# Patient Record
Sex: Female | Born: 1950
Health system: Southern US, Community
[De-identification: ages and names within clinical notes are randomized; demographics above are authoritative.]

## PROBLEM LIST (undated history)

## (undated) DIAGNOSIS — D649 Anemia, unspecified: Secondary | ICD-10-CM

## (undated) DIAGNOSIS — E039 Hypothyroidism, unspecified: Secondary | ICD-10-CM

## (undated) DIAGNOSIS — F32A Depression, unspecified: Secondary | ICD-10-CM

## (undated) DIAGNOSIS — C50919 Malignant neoplasm of unspecified site of unspecified female breast: Secondary | ICD-10-CM

## (undated) DIAGNOSIS — I480 Paroxysmal atrial fibrillation: Secondary | ICD-10-CM

## (undated) DIAGNOSIS — G4730 Sleep apnea, unspecified: Secondary | ICD-10-CM

## (undated) DIAGNOSIS — Z9221 Personal history of antineoplastic chemotherapy: Secondary | ICD-10-CM

## (undated) DIAGNOSIS — Z95810 Presence of automatic (implantable) cardiac defibrillator: Secondary | ICD-10-CM

## (undated) DIAGNOSIS — G4733 Obstructive sleep apnea (adult) (pediatric): Secondary | ICD-10-CM

## (undated) DIAGNOSIS — T82198A Other mechanical complication of other cardiac electronic device, initial encounter: Secondary | ICD-10-CM

## (undated) DIAGNOSIS — K7581 Nonalcoholic steatohepatitis (NASH): Secondary | ICD-10-CM

## (undated) DIAGNOSIS — I5022 Chronic systolic (congestive) heart failure: Secondary | ICD-10-CM

## (undated) DIAGNOSIS — N189 Chronic kidney disease, unspecified: Secondary | ICD-10-CM

## (undated) DIAGNOSIS — I428 Other cardiomyopathies: Secondary | ICD-10-CM

## (undated) DIAGNOSIS — I493 Ventricular premature depolarization: Secondary | ICD-10-CM

## (undated) DIAGNOSIS — I10 Essential (primary) hypertension: Secondary | ICD-10-CM

## (undated) DIAGNOSIS — R161 Splenomegaly, not elsewhere classified: Secondary | ICD-10-CM

## (undated) DIAGNOSIS — Z923 Personal history of irradiation: Secondary | ICD-10-CM

## (undated) DIAGNOSIS — F329 Major depressive disorder, single episode, unspecified: Secondary | ICD-10-CM

## (undated) DIAGNOSIS — F419 Anxiety disorder, unspecified: Secondary | ICD-10-CM

## (undated) HISTORY — DX: Splenomegaly, not elsewhere classified: R16.1

## (undated) HISTORY — PX: CHOLECYSTECTOMY: SHX55

## (undated) HISTORY — PX: ABDOMINAL HYSTERECTOMY: SHX81

## (undated) HISTORY — PX: TONSILLECTOMY: SUR1361

## (undated) HISTORY — DX: Paroxysmal atrial fibrillation: I48.0

## (undated) HISTORY — PX: FOOT SURGERY: SHX648

## (undated) HISTORY — DX: Chronic systolic (congestive) heart failure: I50.22

## (undated) HISTORY — DX: Ventricular premature depolarization: I49.3

## (undated) HISTORY — PX: BREAST LUMPECTOMY: SHX2

## (undated) HISTORY — DX: Obstructive sleep apnea (adult) (pediatric): G47.33

## (undated) HISTORY — PX: ESOPHAGOGASTRODUODENOSCOPY: SHX1529

## (undated) HISTORY — PX: TUBAL LIGATION: SHX77

---

## 1994-04-05 DIAGNOSIS — Z923 Personal history of irradiation: Secondary | ICD-10-CM

## 1994-04-05 DIAGNOSIS — Z9221 Personal history of antineoplastic chemotherapy: Secondary | ICD-10-CM

## 1994-04-05 HISTORY — DX: Personal history of antineoplastic chemotherapy: Z92.21

## 1994-04-05 HISTORY — DX: Personal history of irradiation: Z92.3

## 1994-04-05 HISTORY — PX: BREAST LUMPECTOMY: SHX2

## 1995-03-06 DIAGNOSIS — C50919 Malignant neoplasm of unspecified site of unspecified female breast: Secondary | ICD-10-CM

## 1995-03-06 HISTORY — PX: BREAST EXCISIONAL BIOPSY: SUR124

## 1995-03-06 HISTORY — DX: Malignant neoplasm of unspecified site of unspecified female breast: C50.919

## 2004-03-13 ENCOUNTER — Ambulatory Visit: Payer: Self-pay | Admitting: Internal Medicine

## 2004-04-05 ENCOUNTER — Ambulatory Visit: Payer: Self-pay | Admitting: Internal Medicine

## 2004-07-09 ENCOUNTER — Ambulatory Visit: Payer: Self-pay | Admitting: Internal Medicine

## 2004-07-20 ENCOUNTER — Ambulatory Visit: Payer: Self-pay | Admitting: Oncology

## 2004-08-03 ENCOUNTER — Ambulatory Visit: Payer: Self-pay | Admitting: Oncology

## 2004-08-29 ENCOUNTER — Ambulatory Visit: Payer: Self-pay

## 2004-09-24 ENCOUNTER — Ambulatory Visit: Payer: Self-pay

## 2005-04-19 ENCOUNTER — Ambulatory Visit: Payer: Self-pay | Admitting: Oncology

## 2005-04-30 ENCOUNTER — Ambulatory Visit: Payer: Self-pay | Admitting: Specialist

## 2005-05-06 ENCOUNTER — Ambulatory Visit: Payer: Self-pay | Admitting: Oncology

## 2005-05-27 ENCOUNTER — Ambulatory Visit: Payer: Self-pay | Admitting: Internal Medicine

## 2005-06-10 ENCOUNTER — Ambulatory Visit: Payer: Self-pay | Admitting: Cardiology

## 2005-06-10 ENCOUNTER — Encounter: Payer: Self-pay | Admitting: Cardiology

## 2005-06-10 ENCOUNTER — Ambulatory Visit (HOSPITAL_COMMUNITY): Admission: RE | Admit: 2005-06-10 | Discharge: 2005-06-10 | Payer: Self-pay | Admitting: Internal Medicine

## 2005-06-29 ENCOUNTER — Encounter: Payer: Self-pay | Admitting: Cardiovascular Disease

## 2005-06-29 ENCOUNTER — Ambulatory Visit: Payer: Self-pay | Admitting: Cardiovascular Disease

## 2005-06-30 ENCOUNTER — Ambulatory Visit (HOSPITAL_COMMUNITY): Admission: RE | Admit: 2005-06-30 | Discharge: 2005-07-01 | Payer: Self-pay | Admitting: Internal Medicine

## 2005-06-30 ENCOUNTER — Ambulatory Visit: Payer: Self-pay | Admitting: Internal Medicine

## 2005-06-30 HISTORY — PX: PACEMAKER INSERTION: SHX728

## 2005-07-14 ENCOUNTER — Ambulatory Visit: Payer: Self-pay

## 2005-10-12 ENCOUNTER — Ambulatory Visit: Payer: Self-pay | Admitting: Internal Medicine

## 2006-02-18 ENCOUNTER — Ambulatory Visit: Payer: Self-pay

## 2006-04-11 ENCOUNTER — Ambulatory Visit: Payer: Self-pay | Admitting: Oncology

## 2006-04-12 ENCOUNTER — Ambulatory Visit: Payer: Self-pay | Admitting: Internal Medicine

## 2006-05-06 ENCOUNTER — Ambulatory Visit: Payer: Self-pay | Admitting: Oncology

## 2006-07-12 ENCOUNTER — Ambulatory Visit: Payer: Self-pay | Admitting: Internal Medicine

## 2006-10-04 ENCOUNTER — Ambulatory Visit: Payer: Self-pay | Admitting: Internal Medicine

## 2007-01-19 ENCOUNTER — Other Ambulatory Visit: Payer: Self-pay

## 2007-01-19 ENCOUNTER — Ambulatory Visit: Payer: Self-pay | Admitting: Unknown Physician Specialty

## 2007-01-24 ENCOUNTER — Ambulatory Visit: Payer: Self-pay

## 2007-01-25 ENCOUNTER — Ambulatory Visit: Payer: Self-pay | Admitting: Unknown Physician Specialty

## 2007-04-06 ENCOUNTER — Ambulatory Visit: Payer: Self-pay | Admitting: Oncology

## 2007-04-06 HISTORY — PX: JOINT REPLACEMENT: SHX530

## 2007-04-19 ENCOUNTER — Ambulatory Visit: Payer: Self-pay | Admitting: Oncology

## 2007-04-20 ENCOUNTER — Ambulatory Visit: Payer: Self-pay | Admitting: Internal Medicine

## 2007-04-21 ENCOUNTER — Ambulatory Visit: Payer: Self-pay | Admitting: Podiatry

## 2007-05-07 ENCOUNTER — Ambulatory Visit: Payer: Self-pay | Admitting: Oncology

## 2007-07-25 ENCOUNTER — Ambulatory Visit: Payer: Self-pay | Admitting: Internal Medicine

## 2007-11-27 ENCOUNTER — Ambulatory Visit: Payer: Self-pay | Admitting: Internal Medicine

## 2008-01-01 ENCOUNTER — Ambulatory Visit: Payer: Self-pay | Admitting: Unknown Physician Specialty

## 2008-04-19 ENCOUNTER — Ambulatory Visit: Payer: Self-pay | Admitting: Internal Medicine

## 2008-05-08 ENCOUNTER — Encounter: Payer: Self-pay | Admitting: Internal Medicine

## 2008-08-01 ENCOUNTER — Encounter: Payer: Self-pay | Admitting: Internal Medicine

## 2008-09-03 ENCOUNTER — Ambulatory Visit: Payer: Self-pay | Admitting: Internal Medicine

## 2008-09-10 DIAGNOSIS — I429 Cardiomyopathy, unspecified: Secondary | ICD-10-CM | POA: Insufficient documentation

## 2008-09-10 DIAGNOSIS — Z95810 Presence of automatic (implantable) cardiac defibrillator: Secondary | ICD-10-CM | POA: Insufficient documentation

## 2008-09-10 DIAGNOSIS — I5022 Chronic systolic (congestive) heart failure: Secondary | ICD-10-CM | POA: Insufficient documentation

## 2008-09-10 DIAGNOSIS — I5023 Acute on chronic systolic (congestive) heart failure: Secondary | ICD-10-CM | POA: Insufficient documentation

## 2008-10-10 ENCOUNTER — Ambulatory Visit: Payer: Self-pay | Admitting: General Practice

## 2008-10-21 ENCOUNTER — Ambulatory Visit: Payer: Self-pay | Admitting: Internal Medicine

## 2008-10-21 ENCOUNTER — Encounter: Payer: Self-pay | Admitting: Internal Medicine

## 2008-10-24 ENCOUNTER — Inpatient Hospital Stay: Payer: Self-pay | Admitting: General Practice

## 2008-11-12 ENCOUNTER — Encounter: Payer: Self-pay | Admitting: General Practice

## 2008-12-04 ENCOUNTER — Encounter: Payer: Self-pay | Admitting: General Practice

## 2009-01-01 ENCOUNTER — Ambulatory Visit: Payer: Self-pay | Admitting: Unknown Physician Specialty

## 2009-01-03 ENCOUNTER — Encounter: Payer: Self-pay | Admitting: General Practice

## 2009-01-21 ENCOUNTER — Ambulatory Visit: Payer: Self-pay | Admitting: Internal Medicine

## 2009-02-03 ENCOUNTER — Encounter: Payer: Self-pay | Admitting: General Practice

## 2009-02-10 ENCOUNTER — Encounter: Payer: Self-pay | Admitting: Internal Medicine

## 2009-04-05 HISTORY — PX: BREAST EXCISIONAL BIOPSY: SUR124

## 2009-04-29 ENCOUNTER — Ambulatory Visit: Payer: Self-pay | Admitting: Internal Medicine

## 2009-05-07 ENCOUNTER — Encounter: Payer: Self-pay | Admitting: Internal Medicine

## 2009-07-29 ENCOUNTER — Ambulatory Visit: Payer: Self-pay | Admitting: Internal Medicine

## 2009-08-01 ENCOUNTER — Encounter: Payer: Self-pay | Admitting: Internal Medicine

## 2009-08-18 ENCOUNTER — Ambulatory Visit: Payer: Self-pay | Admitting: Unknown Physician Specialty

## 2009-09-19 ENCOUNTER — Ambulatory Visit: Payer: Self-pay | Admitting: Surgery

## 2009-09-26 ENCOUNTER — Ambulatory Visit: Payer: Self-pay | Admitting: Surgery

## 2009-10-10 ENCOUNTER — Ambulatory Visit: Payer: Self-pay | Admitting: Internal Medicine

## 2009-12-10 ENCOUNTER — Ambulatory Visit: Payer: Self-pay

## 2010-01-08 ENCOUNTER — Ambulatory Visit: Payer: Self-pay | Admitting: Internal Medicine

## 2010-04-17 ENCOUNTER — Encounter (INDEPENDENT_AMBULATORY_CARE_PROVIDER_SITE_OTHER): Payer: Self-pay | Admitting: *Deleted

## 2010-04-20 ENCOUNTER — Ambulatory Visit
Admission: RE | Admit: 2010-04-20 | Discharge: 2010-04-20 | Payer: Self-pay | Source: Home / Self Care | Attending: Internal Medicine | Admitting: Internal Medicine

## 2010-04-20 ENCOUNTER — Encounter: Payer: Self-pay | Admitting: Internal Medicine

## 2010-05-05 NOTE — Letter (Signed)
Summary: Remote Device Check  [facility] [place]  [address] [place]   [place], [place] [address]   Phone: [phone]  Fax: [phone]     May 07, 2009 MRN: [id number]   [place], [place]  [address]   Dear Ms. SHENOY,   Your remote transmission was recieved and reviewed by your physician.  All diagnostics were within normal limits for you.  __X___Your next transmission is scheduled for: July 29, 2009.  Please transmit at any time this day.  If you have a wireless device your transmission will be sent automatically.     Sincerely,  Hotel manager

## 2010-05-05 NOTE — Cardiovascular Report (Signed)
Summary: Office Visit Remote   Office Visit Remote   Imported By: Sallee Provencal 02/20/2010 14:09:01  _____________________________________________________________________  External Attachment:    Type:   Image     Comment:   External Document

## 2010-05-05 NOTE — Cardiovascular Report (Signed)
Summary: Office Visit Remote  Office Visit Remote   Imported By: Sallee Provencal 08/08/2009 14:25:44  _____________________________________________________________________  External Attachment:    Type:   Image     Comment:   External Document

## 2010-05-05 NOTE — Assessment & Plan Note (Signed)
Summary: F1Y/AMD   Visit Type:  Follow-up Primary Provider:  Melynda Ripple, MD  CC:  shortness of breath.  History of Present Illness: Katherine Rosales returns today for ICD followup for primary prevention in the setting of nonischemic heart disease.  The patient denies SOB, chest pain, edema or palpitations   her knee has been replaced and is much better; she has pain in her right foot.     Current Medications (verified): 1)  Hydrocodone-Acetaminophen 5-500 Mg Tabs (Hydrocodone-Acetaminophen) .Marland Kitchen.. 1 By Mouth Once Daily As Needed 2)  Carvedilol 12.5 Mg Tabs (Carvedilol) .... Take One Tablet By Mouth Twice A Day 3)  Levoxyl 100 Mcg Tabs (Levothyroxine Sodium) .Marland Kitchen.. 1 By Mouth Once Daily 4)  Zolpidem Tartrate 5 Mg Tabs (Zolpidem Tartrate) .Marland Kitchen.. 1 By Mouth Once Daily 5)  Furosemide 20 Mg Tabs (Furosemide) .... Take One Tablet By Mouth Daily. 6)  Lisinopril 20 Mg Tabs (Lisinopril) .... Take One Tablet By Mouth Daily 7)  Sertraline Hcl 100 Mg Tabs (Sertraline Hcl) .Marland Kitchen.. 1 By Mouth Once Daily 8)  Alprazolam 0.5 Mg Tabs (Alprazolam) .Marland Kitchen.. 1 By Mouth Once Daily 9)  Diclofenac Sodium 75 Mg Tbec (Diclofenac Sodium) .Marland Kitchen.. 1 By Mouth Two Times A Day 10)  Prilosec 20 Mg Cpdr (Omeprazole) .Marland Kitchen.. 1 By Mouth Once Daily 11)  Loratadine 10 Mg Tabs (Loratadine) .Marland Kitchen.. 1 By Mouth Once Daily 12)  Docusate Sodium 100 Mg Caps (Docusate Sodium) .Marland Kitchen.. 1 By Mouth Two Times A Day 13)  Centrum Silver  Tabs (Multiple Vitamins-Minerals) .Marland Kitchen.. 1po Once Daily 14)  Caltrate 600+d 600-400 Mg-Unit Tabs (Calcium Carbonate-Vitamin D) .Marland Kitchen.. 1 By Mouth Two Times A Day  Allergies (verified): No Known Drug Allergies  Past History:  Past Medical History: Last updated: 84/69/6295 SYSTOLIC HEART FAILURE, CHRONIC (ICD-428.22) CARDIOMYOPATHY, SECONDARY (ICD-425.9) ICD - IN SITU (ICD-V45.02)    Past Surgical History: Last updated: 09/10/2008 Lumpectomy Tonsillectomy tubal ligation Medtronic Virtuoso VR ICD - 06/30/05 - Nonischemic  cardiomyopathy potentially related to     adriamycin, class 3 congestive failure with a narrow QRS and a negative TDI.  Vital Signs:  Patient profile:   60 year old female Height:      62 inches Weight:      231 pounds BMI:     42.40 Pulse rate:   71 / minute BP sitting:   119 / 73  (left arm) Cuff size:   large  Vitals Entered By: Mignon Pine, RMA (October 10, 2009 2:55 PM)  Physical Exam  General:  The patient was alert and oriented in no acute distress. HEENT Normal.  Neck veins were flat, carotids were brisk.  Lungs were clear.  Heart sounds were regular without murmurs or gallops.  Abdomen was soft with active bowel sounds. There is no clubbing cyanosis or edema. Skin Warm and dry     ICD Specifications Following MD:  [name], MD     ICD Vendor:  Medtronic     ICD Model Number:  [id number]     ICD Serial Number:  [id number] ICD DOI:  06/30/2005     ICD Implanting MD:  [name], MD  Lead 1:    Location: RV     DOI: 06/30/2005     Model #: [id number]     Serial #: [id number]     Status: active  Indications::  NICM   ICD Follow Up Remote Check?  No Battery Voltage:  3.03 V     Charge Time:  9.8 seconds  Underlying rhythm:  SR ICD Dependent:  No       ICD Device Measurements Right Ventricle:  Amplitude: 12.4 mV, Impedance: 456 ohms, Threshold: 0.5 V at 0.4 msec Shock Impedance: 43/49 ohms   Episodes Coumadin:  No Shock:  0     ATP:  0     Nonsustained:  1     Ventricular Pacing:  0%  Brady Parameters Mode VVI     Lower Rate Limit:  40      Tachy Zones VF:  207     VT:  171     Next Remote Date:  01/08/2010     Next Cardiology Appt Due:  10/04/2010 Tech Comments:  No parameter changes.  Device function normal.  Optivol and thoracic impedance abnormal 10/05/09.  6949 lead stable SIC ).  Alert tones demonstated for the patient along with updated letter and magnet.  Carelink transmissions every 3 months.  ROV 1 year with Dr. [name] in [place]. Alma Friendly, LPN   October 11, 6267 4:85 PM   Impression & Recommendations:  Problem # 1:  [phone] SPRINT FIDELIS LEAD (ICD-996.04) the patient was given instructions regarding the 6949-lead and the need for magnet.  Problem # 2:  SYSTOLIC HEART FAILURE, CHRONIC (ICD-428.22) Heart failure is relatively well compensated. Her updated medication list for this problem includes:    Carvedilol 12.5 Mg Tabs (Carvedilol) .Marland Kitchen... Take 1 1/2 tablets by mouth twice a day    Furosemide 20 Mg Tabs (Furosemide) .Marland Kitchen... Take one tablet by mouth daily.    Lisinopril 20 Mg Tabs (Lisinopril) .Marland Kitchen... Take one tablet by mouth daily  Problem # 3:  CARDIOMYOPATHY, SECONDARY (ICD-425.9) as noted above she is relatively well compensated; however, given her left ventricular dysfunction I've taken the liberty of increasing her carvedilol to 18.75 twice a day with the target being 25 twice a day.  In addition, I would ask her primary cardiologist to consider the addition of Aldactone based on the Emphasis Trial. Her updated medication list for this problem includes:    Carvedilol 12.5 Mg Tabs (Carvedilol) .Marland Kitchen... Take 1 1/2 tablets by mouth twice a day    Furosemide 20 Mg Tabs (Furosemide) .Marland Kitchen... Take one tablet by mouth daily.    Lisinopril 20 Mg Tabs (Lisinopril) .Marland Kitchen... Take one tablet by mouth daily  Problem # 4:  ICD - IN SITU (ICD-V45.02) Device parameters and data were reviewed and no changes were made  Patient Instructions: 1)  Your physician has recommended you make the following change in your med62moication: START taking carvedilol 1 1/2 tabs two times a day  Prescriptions: CARVEDILOL 12.5 MG TABS (CARVEDILOL) Take 1 1/2 tablets by mouth twice a day  #90 x 6   Entered by:   ACordelia Pen RN   Authorized by:   SNikki Dom MD, [facility]  Signed by:   ACordelia Pen RN on 10/10/2009   Method used:   Electronically to        [facility] (retail)       [address]      [place]  [place]  [address]      Ph: [phone]      Fax: [phone]  RxID:   [id number]

## 2010-05-05 NOTE — Cardiovascular Report (Signed)
Summary: Office Visit Remote   Office Visit Remote   Imported By: Sallee Provencal 05/12/2009 16:41:29  _____________________________________________________________________  External Attachment:    Type:   Image     Comment:   External Document

## 2010-05-05 NOTE — Letter (Signed)
Summary: Remote Device Check  [facility], [place]  [address] [place]   [place], [place] [address]   Phone: [phone]  Fax: [phone]     August 01, 2009 MRN: [id number]   [place], [place]  [address]   Dear Ms. NILSON,   Your remote transmission was recieved and reviewed by your physician.  All diagnostics were within normal limits for you.    ___X___Your next office visit is scheduled for: JULY 2011 [place]. Please call our office to schedule an appointment.    Sincerely,  Hotel manager

## 2010-05-07 NOTE — Letter (Signed)
Summary: Device-Delinquent Phone Proofreader, [facility]  [address] [place]   [place], [place] [address]   Phone: [phone]  Fax: [phone]     April 17, 2010 MRN: [id number]   [place], [place]  [address]   Dear Ms. MCCART,  According to our records, you were scheduled for a device phone transmission on 04-16-2010.     We did not receive any results from this check.  If you transmitted on your scheduled day, please call us to help troubleshoot your system.  If you forgot to send your transmission, please send one upon receipt of this letter.  Thank you,   [facility]

## 2010-05-25 ENCOUNTER — Encounter (INDEPENDENT_AMBULATORY_CARE_PROVIDER_SITE_OTHER): Payer: Self-pay | Admitting: *Deleted

## 2010-06-02 NOTE — Letter (Signed)
Summary: Remote Device Check  [facility], [place]  [address] [place]   [place], [place] [address]   Phone: [phone]  Fax: [phone]     May 25, 2010 MRN: [id number]   Katherine Rosales, [name]  [address]   Dear [name],   Your remote transmission was recieved and reviewed by your physician.  All diagnostics were within normal limits for you.  __X___Your next transmission is scheduled for:  07-30-2010.  Please transmit at any time this day.  If you have a wireless device your transmission will be sent automatically.   Sincerely,  Shelly Bombard

## 2010-06-02 NOTE — Cardiovascular Report (Signed)
Summary: Office Visit Remote   Office Visit Remote   Imported By: Sallee Provencal 05/29/2010 11:00:52  _____________________________________________________________________  External Attachment:    Type:   Image     Comment:   External Document

## 2010-07-30 ENCOUNTER — Ambulatory Visit (INDEPENDENT_AMBULATORY_CARE_PROVIDER_SITE_OTHER): Payer: BC Managed Care – PPO | Admitting: *Deleted

## 2010-07-30 DIAGNOSIS — Z95810 Presence of automatic (implantable) cardiac defibrillator: Secondary | ICD-10-CM

## 2010-07-30 DIAGNOSIS — I5022 Chronic systolic (congestive) heart failure: Secondary | ICD-10-CM

## 2010-07-30 DIAGNOSIS — I429 Cardiomyopathy, unspecified: Secondary | ICD-10-CM

## 2010-08-01 ENCOUNTER — Other Ambulatory Visit: Payer: Self-pay

## 2010-08-09 NOTE — Progress Notes (Signed)
icd remote w/icm

## 2010-08-12 ENCOUNTER — Encounter: Payer: Self-pay | Admitting: *Deleted

## 2010-08-18 NOTE — Assessment & Plan Note (Signed)
[facility] OFFICE NOTE   HAE, AHLERS                       MRN:          [id number]  DATE:10/04/2006                            DOB:          1951-01-11    OBJECTIVE:  Ms. Katherine Rosales comes in.  Her husband has died in the  last couple of months from his debilitating Alzheimer's.  She is still  tearful and very emotional.   MEDICATIONS:  1. Are notable for the inter-current addition of sertraline.  2. Also the addition of Lisinopril.  3. She also is taking Coreg.  4. Lasix.   PHYSICAL EXAMINATION:  VITAL SIGNS:  Blood pressure 106/70, pulse 68.  LUNGS:  Clear.  HEART:  Sounds were regular.  EXTREMITIES:  Without edema.   Interrogation of her Medtronic ICD demonstrates an R-wave of 15.4 with  an impedance of 528, threshold of 1 volt at 0.4.  High voltage impedance  47/57, battery voltage 3.16.   IMPRESSION:  1. Non-ischemic cardiomyopathy.  2. Class 2 to 3 congestive heart failure.  3. Status post implantable cardioverter defibrillator for number one.  4. Emotional lability with the inter-current death of her husband.   Katherine Rosales is stable from an arrhythmia point of view.  We talked for 10-  15 minutes about her breathing status, and with her husband's birthday  coming up next week, I have encouraged her to spend some time with her  family that week.     Katherine Sprang, MD, [facility]  Electronically Signed    SCK/MedQ  DD: 10/04/2006  DT: 10/04/2006  Job #: [id number]   cc:   Katherine Skains, MD  Katherine Rosales

## 2010-08-18 NOTE — Assessment & Plan Note (Signed)
[facility] OFFICE NOTE   Katherine Rosales, Katherine Rosales                       MRN:          [id number]  DATE:01/24/2007                            DOB:          [birth year]    Katherine Rosales was seen today in the [facility] clinic on January 24, 2007  for followup of her Medtronic model #[id number], Virtuoso.  Date of implant  was June 30, 2005 for cardiomyopathy.  On interrogation of her device  today, her battery voltage is 3.14 with a charge time of 9 seconds.  R  waves measured 16.1 mV with a ventricular pacing threshold of 1 volt at  0.4 ms and a ventricular lead impedance of 520 ohm.  Shock impedance was  47.  Underlying rhythm today is sinus rhythm at 70 beats per minute.  She is descending 100% of the time.  There were no episodes since last  interrogation.  She does have a 6949 Medtronic Alert lead.  Her software  was downloaded today.  Her SIC counter was 0.  She will send a Care Link  transmission in January with a return office visit here in [place] to  see Dr. [name] in July, 2009.      Alma Friendly, LPN  Electronically Signed      Deboraha Sprang, MD, [facility]  Electronically Signed   PO/MedQ  DD: 01/24/2007  DT: 01/25/2007  Job #: [id number]

## 2010-08-18 NOTE — Letter (Signed)
November 27, 2007    Melynda Ripple, MD  [place], [place] [address]   RE:  Katherine Rosales, Katherine Rosales  MRN:  [id number]  /  DOB:  1951-03-24   Dear Fritz Pickerel,   [name] comes in today in followup for an ICD implanted for  nonischemic myopathy with congestive cardiomyopathy.  Her major problems  intercurrently have been bunion surgery, which has been associated with  poor healing, potential need for knee surgery and the ongoing depression  related to her husband's death.   Her medications currently include:  1. Lisinopril 20.  2. Carvedilol 12.5 b.i.d.  3. Alprazolam 0.5.  4. Sertraline 100.   On examination, her blood pressure is 140/78.  Her pulse is 64.  Her  weight was 240, which is stable.  Her lungs were clear.  Her heart  sounds were regular.  Neck veins were 7-8 cm.  Extremities had trace  edema.   Interrogation of Medtronic Virtuoso ICD demonstrates an R-wave of 12.7  with impedance of 496.  The threshold was 0.5 at 0.4.  Battery voltage  is 3.11.  A 6949 lead in place, which is stable.   IMPRESSION:  1. Nonischemic cardiomyopathy.  2. Chronic systolic heart failure - Class II.  3. A 6949 lead/MDT - implantable cardioverter-defibrillator.   [name] is doing quite well from an arrhythmia point of view.  I  suggested that when she see me the next time or see Bruce that her  carvedilol be uptitrated towards the target dose of 25 mg a day.   We will see her again in 1 year's time, should be interrogated remotely  in 3 months.    Sincerely,      Deboraha Sprang, MD, [facility]  Electronically Signed    SCK/MedQ  DD: 11/27/2007  DT: 11/28/2007  Job #: [id number]   CC:    Serafina Royals, MD

## 2010-08-20 ENCOUNTER — Ambulatory Visit: Payer: Self-pay | Admitting: Unknown Physician Specialty

## 2010-08-21 NOTE — Op Note (Signed)
NAME:  Katherine Rosales, Katherine Rosales NO.:  [id number]   MEDICAL RECORD NO.:  [id number]          PATIENT TYPE:  INP   LOCATION:  4703                         FACILITY:  [facility]   PHYSICIAN:  Deboraha Sprang, M.D.  DATE OF BIRTH:  03/13/1951   DATE OF PROCEDURE:  06/30/2005  DATE OF DISCHARGE:                                 OPERATIVE REPORT   PREOPERATIVE DIAGNOSIS:  Nonischemic cardiomyopathy potentially related to  adriamycin, class 3 congestive failure with a narrow QRS and a negative TDI.   POSTOPERATIVE DIAGNOSIS:  Nonischemic cardiomyopathy potentially related to  adriamycin, class 3 congestive failure with a narrow QRS and a negative TDI.   OPERATION PERFORMED:  Single chamber defibrillator implantation with  intraoperative defibrillation threshold testing.   DESCRIPTION OF PROCEDURE:  Following the obtaining of informed consent, the  patient was brought to the electrophysiology laboratory and placed on the  fluoroscopic table in supine position.  After routine prep and drape of the  left upper chest, lidocaine was infiltrated in the prepectoral subclavicular  region.  An incision was made and carried down to the layer of the  prepectoral fascia using electrocautery and sharp dissection.  A pocket was  formed similarly, hemostasis was obtained.   Thereafter attention was turned to gaining access to the extrathoracic left  subclavian vein which was accomplished without difficulty, without the  aspiration of air or puncture of the artery.  A 7 French sheath was placed  through which was passed a Medtronic 6949 65 cm active fixation dual chamber  defibrillator lead, serial  number [id number].  Under fluoroscopic guidance  it was manipulated to the right ventricular apex where the bipolar R wave  was 13.6 mV with a pacing impedance of 1043 ohms and threshold 0.8 V at 0.5  msec, current at threshold 0.9 mA and there was no diaphragmatic pacing at  10 V. This lead was secured  to the prepectoral fascia and then attached to a  Medtronic Virtuoso VR ICD serial number [id number] serial number [id number].  Through the device the bipolar R wave is 13.1 mV.  With the pacing impedance  of 70 ohms and a threshold of 0.5 Volts at 0.4 msec, current of injury was  adequate.  SVC coil impedance was 48.  Distal coil impedance was 44.   At this point defibrillation threshold testing was undertaken.  Ventricular  fibrillation was induced via the T wave shock.  After a total duration of 8  seconds, a 20 joule shock was delivered through a measured resistance of 39  ohms, terminating ventricular fibrillation restoring sinus rhythm.   After waiting five to six minutes, ventricular fibrillation was reinduced  via the T wave shock.  After a total duration of seven seconds, a 20 joule  shock was delivered through a measured resistance of 39 ohms terminating  ventricular fibrillation and restoring sinus rhythm.  At this point the  device was implanted.  The pocket was copiously irrigated with antibiotic  containing saline solution.  Hemostasis was assured and the lead and pulse  generator were placed in the pocket and secured to  the prepectoral fascia.  The wound was closed in three layers in normal fashion.  Wound  was washed and dried and a benzoin and Steri-Strip dressing was applied.  Sponge, needle and instrument counts were correct at the end of the  procedure according to the staff.  The patient tolerated the procedure  without apparent complications.           ______________________________  Deboraha Sprang, M.D.     SCK/MEDQ  D:  06/30/2005  T:  07/01/2005  Job:  [id number]   cc:   Dr. Serafina Royals, [place]  Fax: [phone]   Electrophys lab   [place] [facility]

## 2010-08-21 NOTE — Letter (Signed)
October 12, 2005     Dr. Serafina Royals  [place], [place]   RE:  Katherine Rosales, Katherine Rosales  MRN #[id number]  /  DOB:  [birth year]   Dear Darnell Level,   I hope this letter finds you well.  Katherine Rosales is doing well following  ICD implantation for Class III congestive heart failure and non-ischemic  heart disease.  She is actually doing quite well emotionally as well.  She  recently received her disability.   MEDICATIONS:  Her medications are reviewed and are __________ and notable  for Coreg now at __________ at bedtime.   PHYSICAL EXAMINATION:  VITAL SIGNS:  On examination her blood pressure was  114/73.  Her pulse is 73.  LUNGS:  Clear.  HEART:  Sounds were regular.  EXTREMITIES:  Without edema.   Interrogation of her Medtronic Virtuoso Pulse Generator demonstrates an R  wave of 19.7 with an impedence of 536 with a threshold of 1 volt at 0.4.  The battery voltage was 3.20.   The device is reprogrammed to maximize longevity.   IMPRESSION:  1.  Non-ischemic heart disease.  2.  Status post ICD implantation.  3.  MR.  4.  Emotional lability.   Katherine Rosales is doing quite well.  The issue Bruce I guess will be where she  gets her device followed up.  I set her up remotely  temporarily to be done via [facility].  I have told her that she can talk with  you all about whether you want to get it done in your office.  We will be  glad to help her with whatever we can. ,    Sincerely,      Deboraha Sprang, MD, [facility]   SCK/MedQ  DD:  10/12/2005  DT:  10/12/2005  Job #:  [id number]

## 2010-08-21 NOTE — Discharge Summary (Signed)
NAME:  Katherine Rosales, ORDOYNE NO.:  [id number]   MEDICAL RECORD NO.:  [id number]          PATIENT TYPE:  INP   LOCATION:  3903                         FACILITY:  [facility]   PHYSICIAN:  Sueanne Margarita, P.A. DATE OF BIRTH:  June 22, 1950   DATE OF ADMISSION:  06/30/2005  DATE OF DISCHARGE:  07/01/2005                                 DISCHARGE SUMMARY   ALLERGIES:  This patient has no known drug allergies.   PRINCIPAL DIAGNOSES:  1.  Discharging day one, status post implantation of Medtronic Virtuoso      cardioverter defibrillator.  2.  Nonischemic cardiomyopathy with decline in ejection fraction from 45% to      10%.  3.  Moderate to severe mitral regurgitation/tricuspid regurgitation.  4.  Class III congestive heart failure.  5.  Atrial tachycardia, nonsustained on Holter monitor.   SECONDARY DIAGNOSES:  1.  Obstructive sleep apnea, on BiPAP.  2.  Anxiety/depression.  3.  Nephrotic syndrome/steroids.  4.  Treated hypothyroidism.  5.  Arthritis.  6.  Gastroesophageal reflux disease.  7.  History of breast cancer, status post chemotherapy and radiation      therapy.  8.  Status post tonsillectomy/tubal ligation/lumpectomy.   PROCEDURES:  1.  On June 30, 2005, implantation of Medtronic Virtuoso single-chamber      cardioverter defibrillator with defibrillator threshold study less than      or equal to 20 Joules by Dr. [name].   BRIEF HISTORY:  Katherine Rosales is a 60 year old female who first came to  attention after Adriamycin therapy for breast cancer.  At that time, her  ejection fraction was 45%-50%.  She underwent a catheterization  demonstrating no obstructive coronary artery disease.   She returned to medical attention about a year ago with overt symptoms of  congestive heart failure.  Echocardiogram demonstrated moderate to severe  mitral regurgitation with an ejection fraction of 10%-15% at that time.  She  has been treated medically and has done  well over the last year, but  currently is experiencing progressive and worsening symptoms of congestive  heart failure.  These symptoms are fatigue and exercise intolerance, but no  orthopnea, peripheral edema or nocturnal dyspnea.   She has been diagnosed with obstructive sleep apnea, and she is on BiPAP.  There is some concern that her sleep apnea may be aggravating her  cardiomyopathy.  The patient does not experience significant palpitations,  although she does feel her heart racing sometimes when she sleeps on her  back.  Holter monitoring has demonstrated some atrial tachycardia, but none  which is sustained.   Ms. Laden has severe cardiomyopathy; this is most probably nonischemic, and  most probably secondary to AV regurgitation.  An implantable cardioverter  defibrillator makes sense, given that her congestive heart failure symptoms  are class III.  I would recommend Doppler imaging, to see if there was any  dyssynchrony.  Perhaps LV pacing would help reduce mitral regurgitation.  We  plan to undertake tissue Doppler imaging and then consider implantable  cardioverter defibrillator; this could be done on an elective basis, and we  would be glad to see her.   HOSPITAL COURSE:  The patient presented on an elective basis on March 28th.  I do not have the results of the tissue Doppler study at the time of this  dictation.  However, the patient underwent implantation of a Medtronic  Virtuoso single-chamber cardioverter defibrillator, and has done well since  this procedure; Dr. [name] tested it, and defibrillator threshold setting was  less than or equal to 20 Joules.  The patient has had no swelling, hematoma,  drainage or erythema at the incision site.  She is ambulating independently.  She is not requiring anything more than oral analgesics for discomfort.  Followup instructions have been given to her, as well as mobility of the  left arm.  She was asked to keep her incision dry  for the next seven days,  with just sponge bathing until Wednesday, April 4th.  Her discharge diet is  a low-sodium, low-cholesterol diet.  She has been asked not to drive for the  next seven days and not to lift anything heavier than 10 pounds for the next  four weeks.  She has an appointment with [facility] on Wednesday, April 11th at 10:20, and she will  see Dr. Nehemiah Massed in three weeks.  Dr. [name] specifically recommended up-  titration of Coreg at that time, if agreeable.  We will call Dr. Alveria Apley  office to make an appointment, and Dr. Alveria Apley office will call the  patient with this.   DISCHARGE MEDICATIONS:  1.  Coreg 6.25 mg twice daily.  2.  Estradiol 0.5 mg daily.  3.  Furosemide 20 mg daily.  4.  Levoxyl 100 mcg daily.  5.  Effexor XR 150 mg daily.  6.  Gabapentin 400 mg daily.  7.  Alprazolam 0.25 mg twice daily.  8.  Voltaren 75 mg twice daily.  9.  Flonase two sprays to each nostril daily.  10. Prilosec 20 mg daily.  11. Loratadine 10 mg daily.  12. Colace 100 mg two tabs daily.  13. Multivitamin daily.   LABORATORY STUDIES THIS ADMISSION:  Serum electrolytes on March 28th showed  sodium 140, potassium 4.2, chloride 107, carbonate 25, BUN 27, creatinine 1  and glucose 130.  PT on admission was 15, INR 1.2 and PTT 35.  Complete  blood count showed white cells 6.4, hemoglobin 11.8, hematocrit 36 and  platelets 182.      Sueanne Margarita, P.A.     GM/MEDQ  D:  07/01/2005  T:  07/02/2005  Job:  [id number]   cc:   Serafina Royals, MD  [facility]  [place], [place]   Deboraha Sprang, M.D.  [address]  [place] [address]

## 2010-08-21 NOTE — Op Note (Signed)
NAME:  Katherine Rosales, FLAMMER NO.:  [id number]   MEDICAL RECORD NO.:  [id number]          PATIENT TYPE:  INP   LOCATION:  4703                         FACILITY:  [facility]   PHYSICIAN:  Deboraha Sprang, M.D.  DATE OF BIRTH:  December 26, 1950   DATE OF PROCEDURE:  06/30/2005  DATE OF DISCHARGE:  07/01/2005                                 OPERATIVE REPORT   PREOPERATIVE DIAGNOSIS:  Atrial fibrillation.   POSTOPERATIVE DIAGNOSIS:  Sinus rhythm.   PROCEDURE:  Direct current cardioversion.   The patient was submitted to general anesthesia under the care of Dr. Conrad Tensed.  She received sodium Pentothal 150 mg.  A 100 joule shock was delivered  synchronously into atrial fibrillation restoring sinus rhythm.   The patient tolerated the procedure without apparent complications.           ______________________________  Deboraha Sprang, M.D.     SCK/MEDQ  D:  06/30/2005  T:  07/01/2005  Job:  [id number]   cc:   Freeman Caldron. Pauline Aus, M.D.  Fax: [phone]

## 2010-09-24 ENCOUNTER — Encounter: Payer: Self-pay | Admitting: Internal Medicine

## 2010-10-14 ENCOUNTER — Encounter: Payer: BC Managed Care – PPO | Admitting: Internal Medicine

## 2010-10-15 ENCOUNTER — Other Ambulatory Visit: Payer: Self-pay | Admitting: *Deleted

## 2010-10-15 MED ORDER — CARVEDILOL 12.5 MG PO TABS: 18.7500 mg | ORAL_TABLET | Freq: Two times a day (BID) | ORAL | Status: DC

## 2010-10-27 ENCOUNTER — Encounter: Payer: Self-pay | Admitting: Internal Medicine

## 2010-10-27 ENCOUNTER — Ambulatory Visit (INDEPENDENT_AMBULATORY_CARE_PROVIDER_SITE_OTHER): Payer: BC Managed Care – PPO | Admitting: Internal Medicine

## 2010-10-27 DIAGNOSIS — F439 Reaction to severe stress, unspecified: Secondary | ICD-10-CM | POA: Insufficient documentation

## 2010-10-27 DIAGNOSIS — I429 Cardiomyopathy, unspecified: Secondary | ICD-10-CM

## 2010-10-27 DIAGNOSIS — Z733 Stress, not elsewhere classified: Secondary | ICD-10-CM

## 2010-10-27 DIAGNOSIS — Z95810 Presence of automatic (implantable) cardiac defibrillator: Secondary | ICD-10-CM

## 2010-10-27 DIAGNOSIS — I5022 Chronic systolic (congestive) heart failure: Secondary | ICD-10-CM

## 2010-10-27 DIAGNOSIS — I428 Other cardiomyopathies: Secondary | ICD-10-CM

## 2010-10-27 LAB — ICD DEVICE OBSERVATION
CHARGE TIME: 10.099 s
DEV-0020ICD: NEGATIVE
FVT: 0
PACEART VT: 0
RV LEAD IMPEDENCE ICD: 480 Ohm
RV LEAD THRESHOLD: 1 V
TOT-0001: 2
TZAT-0001FASTVT: 1
TZAT-0001SLOWVT: 1
TZAT-0001SLOWVT: 2
TZAT-0004SLOWVT: 8
TZAT-0005SLOWVT: 88 pct
TZAT-0011SLOWVT: 10 ms
TZAT-0011SLOWVT: 10 ms
TZAT-0012FASTVT: 200 ms
TZAT-0012SLOWVT: 200 ms
TZAT-0012SLOWVT: 200 ms
TZAT-0013SLOWVT: 2
TZAT-0018FASTVT: NEGATIVE
TZAT-0019FASTVT: 8 V
TZAT-0019SLOWVT: 8 V
TZAT-0019SLOWVT: 8 V
TZAT-0020SLOWVT: 1.5 ms
TZON-0003VSLOWVT: 450 ms
TZON-0004SLOWVT: 16
TZON-0004VSLOWVT: 20
TZON-0005SLOWVT: 12
TZST-0001FASTVT: 3
TZST-0001SLOWVT: 3
TZST-0001SLOWVT: 4
TZST-0001SLOWVT: 5
TZST-0002FASTVT: NEGATIVE
TZST-0002FASTVT: NEGATIVE
TZST-0002FASTVT: NEGATIVE
TZST-0003SLOWVT: 35 J
TZST-0003SLOWVT: 35 J
VENTRICULAR PACING ICD: 0 pct
VF: 0

## 2010-10-27 NOTE — Assessment & Plan Note (Signed)
The patient's device was interrogated.  The information was reviewed. No changes were made in the programming.    

## 2010-10-27 NOTE — Patient Instructions (Signed)
Continue on current medications. Follow up with Dr.Klein in 12 months.

## 2010-10-27 NOTE — Assessment & Plan Note (Signed)
Stable

## 2010-10-27 NOTE — Progress Notes (Signed)
  HPI  Katherine Rosales is a 60 y.o. female CD followup for primary prevention in the setting of nonischemic heart disease.  The patient denies SOB, chest pain, edema or palpitations  She is stressed greatly in caring for her parents.  She is picking at her face   Past Medical History  Diagnosis Date  . Systolic heart failure, chronic   . Cardiomyopathy   . ICD (implantable cardiac defibrillator), single, in situ     Past Surgical History  Procedure Date  . Breast lumpectomy   . Tonsillectomy   . Tubal ligation   . Pacemaker insertion 06/30/2005    Medtronic Virtuoso VR ICD - Nonischemic cardiomyopathy potentially related to adriamycin, class 3 congestive failture with a narrow QRS and a negative TDI    Current Outpatient Prescriptions  Medication Sig Dispense Refill  . ALPRAZolam (XANAX) 0.5 MG tablet Take 0.5 mg by mouth 2 (two) times daily.       . Calcium Carbonate-Vitamin D (CALTRATE 600+D) 600-400 MG-UNIT per tablet Take 1 tablet by mouth 2 (two) times daily.        . carvedilol (COREG) 12.5 MG tablet Take 1.5 tablets (18.75 mg total) by mouth 2 (two) times daily with a meal.  90 tablet  2  . diclofenac (VOLTAREN) 75 MG EC tablet Take 75 mg by mouth 2 (two) times daily.        Marland Kitchen docusate sodium (COLACE) 100 MG capsule Take 100 mg by mouth 2 (two) times daily.        . furosemide (LASIX) 20 MG tablet Take 20 mg by mouth daily.        Marland Kitchen HYDROcodone-acetaminophen (VICODIN) 5-500 MG per tablet Take 1 tablet by mouth daily as needed.        Marland Kitchen levothyroxine (SYNTHROID, LEVOTHROID) 100 MCG tablet Take 100 mcg by mouth daily.        Marland Kitchen lisinopril (PRINIVIL,ZESTRIL) 20 MG tablet Take 20 mg by mouth daily.        Marland Kitchen loratadine (CLARITIN) 10 MG tablet Take 10 mg by mouth daily.        . Multiple Vitamins-Minerals (CENTRUM SILVER) tablet Take 1 tablet by mouth daily.        Marland Kitchen omeprazole (PRILOSEC) 20 MG capsule Take 20 mg by mouth daily.        . sertraline (ZOLOFT) 100 MG tablet Take  100 mg by mouth 2 (two) times daily.       Marland Kitchen zolpidem (AMBIEN) 5 MG tablet Take 5 mg by mouth daily.          No Known Allergies  Review of Systems negative except from HPI and PMH  Physical Exam Well developed and well nourished in no acute distress HENT normaE scleral and icterus clear Neck Supple clear to ausculation Regular rate and rhythm, no murmurs gallops or rub Soft with active bowel sounds No clubbing cyanosis and edema Alert and oriented, grossly normal motor and sensory function Skin Warm and Dry. Multiple sores on favce    Assessment and  Plan

## 2010-10-27 NOTE — Assessment & Plan Note (Signed)
contnue current meds

## 2010-10-27 NOTE — Assessment & Plan Note (Signed)
Have suggested she seek counseling

## 2011-01-28 ENCOUNTER — Encounter: Payer: BC Managed Care – PPO | Admitting: *Deleted

## 2011-02-03 ENCOUNTER — Other Ambulatory Visit: Payer: Self-pay | Admitting: *Deleted

## 2011-02-03 MED ORDER — CARVEDILOL 12.5 MG PO TABS: 18.7500 mg | ORAL_TABLET | Freq: Two times a day (BID) | ORAL | Status: DC

## 2011-02-04 ENCOUNTER — Encounter: Payer: Self-pay | Admitting: *Deleted

## 2011-02-07 ENCOUNTER — Other Ambulatory Visit: Payer: Self-pay | Admitting: Internal Medicine

## 2011-02-07 ENCOUNTER — Encounter: Payer: Self-pay | Admitting: Internal Medicine

## 2011-02-08 ENCOUNTER — Encounter: Payer: BC Managed Care – PPO | Admitting: *Deleted

## 2011-02-08 ENCOUNTER — Ambulatory Visit (INDEPENDENT_AMBULATORY_CARE_PROVIDER_SITE_OTHER): Payer: BC Managed Care – PPO | Admitting: *Deleted

## 2011-02-08 DIAGNOSIS — I5022 Chronic systolic (congestive) heart failure: Secondary | ICD-10-CM

## 2011-02-08 DIAGNOSIS — I429 Cardiomyopathy, unspecified: Secondary | ICD-10-CM

## 2011-02-08 DIAGNOSIS — Z95810 Presence of automatic (implantable) cardiac defibrillator: Secondary | ICD-10-CM

## 2011-02-14 LAB — REMOTE ICD DEVICE
BATTERY VOLTAGE: 2.88 V
BRDY-0002RV: 40 {beats}/min
CHARGE TIME: 10.48 s
DEV-0020ICD: NEGATIVE
FVT: 0
PACEART VT: 0
RV LEAD IMPEDENCE ICD: 472 Ohm
TOT-0001: 2
TZAT-0001SLOWVT: 2
TZAT-0002FASTVT: NEGATIVE
TZAT-0004SLOWVT: 8
TZAT-0005SLOWVT: 88 pct
TZAT-0011SLOWVT: 10 ms
TZAT-0011SLOWVT: 10 ms
TZAT-0012FASTVT: 200 ms
TZAT-0012SLOWVT: 200 ms
TZAT-0013SLOWVT: 2
TZAT-0013SLOWVT: 2
TZAT-0018FASTVT: NEGATIVE
TZAT-0020FASTVT: 1.5 ms
TZON-0003SLOWVT: 350 ms
TZON-0003VSLOWVT: 450 ms
TZON-0004SLOWVT: 16
TZON-0004VSLOWVT: 20
TZON-0005SLOWVT: 12
TZST-0001FASTVT: 2
TZST-0001FASTVT: 4
TZST-0001FASTVT: 6
TZST-0001SLOWVT: 3
TZST-0002FASTVT: NEGATIVE
TZST-0003SLOWVT: 35 J
TZST-0003SLOWVT: 35 J
TZST-0003SLOWVT: 35 J
VENTRICULAR PACING ICD: 0 pct
VF: 0

## 2011-02-19 NOTE — Progress Notes (Signed)
Remote icd check w/icm

## 2011-03-11 ENCOUNTER — Encounter: Payer: Self-pay | Admitting: *Deleted

## 2011-05-20 ENCOUNTER — Ambulatory Visit (INDEPENDENT_AMBULATORY_CARE_PROVIDER_SITE_OTHER): Payer: Medicare Other | Admitting: *Deleted

## 2011-05-20 ENCOUNTER — Encounter: Payer: Self-pay | Admitting: Internal Medicine

## 2011-05-20 DIAGNOSIS — I429 Cardiomyopathy, unspecified: Secondary | ICD-10-CM

## 2011-05-20 DIAGNOSIS — I5022 Chronic systolic (congestive) heart failure: Secondary | ICD-10-CM

## 2011-05-23 LAB — REMOTE ICD DEVICE
CHARGE TIME: 10.48 s
DEV-0020ICD: NEGATIVE
PACEART VT: 0
RV LEAD AMPLITUDE: 13.4 mv
RV LEAD IMPEDENCE ICD: 480 Ohm
TOT-0002: 0
TOT-0006: 20070328000000
TZAT-0002FASTVT: NEGATIVE
TZAT-0004SLOWVT: 8
TZAT-0005SLOWVT: 88 pct
TZAT-0005SLOWVT: 91 pct
TZAT-0011SLOWVT: 10 ms
TZAT-0012FASTVT: 200 ms
TZAT-0012SLOWVT: 200 ms
TZAT-0019FASTVT: 8 V
TZAT-0019SLOWVT: 8 V
TZAT-0019SLOWVT: 8 V
TZON-0003VSLOWVT: 450 ms
TZST-0001FASTVT: 4
TZST-0001FASTVT: 6
TZST-0001SLOWVT: 3
TZST-0001SLOWVT: 4
TZST-0001SLOWVT: 5
TZST-0001SLOWVT: 6
TZST-0002FASTVT: NEGATIVE
TZST-0003SLOWVT: 20 J
VENTRICULAR PACING ICD: 0 pct

## 2011-05-28 NOTE — Progress Notes (Signed)
ICD remote with ICM 

## 2011-06-02 ENCOUNTER — Encounter: Payer: Self-pay | Admitting: *Deleted

## 2011-07-09 ENCOUNTER — Other Ambulatory Visit: Payer: Self-pay

## 2011-07-09 MED ORDER — CARVEDILOL 12.5 MG PO TABS: 18.7500 mg | ORAL_TABLET | Freq: Two times a day (BID) | ORAL | Status: DC

## 2011-08-19 ENCOUNTER — Encounter: Payer: Self-pay | Admitting: Internal Medicine

## 2011-08-19 ENCOUNTER — Ambulatory Visit (INDEPENDENT_AMBULATORY_CARE_PROVIDER_SITE_OTHER): Payer: Medicare Other | Admitting: *Deleted

## 2011-08-19 DIAGNOSIS — I5022 Chronic systolic (congestive) heart failure: Secondary | ICD-10-CM

## 2011-08-19 DIAGNOSIS — Z95810 Presence of automatic (implantable) cardiac defibrillator: Secondary | ICD-10-CM

## 2011-08-27 LAB — REMOTE ICD DEVICE
BRDY-0002RV: 40 {beats}/min
CHARGE TIME: 11.21 s
FVT: 0
RV LEAD AMPLITUDE: 14.4 mv
TOT-0001: 2
TOT-0006: 20070328000000
TZAT-0001FASTVT: 1
TZAT-0002FASTVT: NEGATIVE
TZAT-0005SLOWVT: 88 pct
TZAT-0005SLOWVT: 91 pct
TZAT-0011SLOWVT: 10 ms
TZAT-0013SLOWVT: 2
TZAT-0018SLOWVT: NEGATIVE
TZAT-0018SLOWVT: NEGATIVE
TZAT-0019FASTVT: 8 V
TZAT-0020SLOWVT: 1.5 ms
TZON-0003SLOWVT: 350 ms
TZON-0003VSLOWVT: 450 ms
TZON-0004VSLOWVT: 20
TZON-0005SLOWVT: 12
TZST-0001FASTVT: 3
TZST-0001FASTVT: 5
TZST-0001SLOWVT: 3
TZST-0001SLOWVT: 4
TZST-0002FASTVT: NEGATIVE
TZST-0003SLOWVT: 35 J
TZST-0003SLOWVT: 35 J
VF: 0

## 2011-09-03 ENCOUNTER — Ambulatory Visit: Payer: Self-pay | Admitting: Family Medicine

## 2011-09-06 ENCOUNTER — Encounter: Payer: Self-pay | Admitting: *Deleted

## 2011-09-13 ENCOUNTER — Ambulatory Visit: Payer: Self-pay | Admitting: Family Medicine

## 2011-10-01 ENCOUNTER — Other Ambulatory Visit: Payer: Self-pay | Admitting: Internal Medicine

## 2011-10-01 MED ORDER — CARVEDILOL 12.5 MG PO TABS: 18.7500 mg | ORAL_TABLET | Freq: Two times a day (BID) | ORAL | Status: DC

## 2011-10-15 ENCOUNTER — Encounter: Payer: Self-pay | Admitting: *Deleted

## 2011-10-26 ENCOUNTER — Encounter: Payer: Self-pay | Admitting: Internal Medicine

## 2011-10-26 ENCOUNTER — Ambulatory Visit (INDEPENDENT_AMBULATORY_CARE_PROVIDER_SITE_OTHER): Payer: Medicare Other | Admitting: Internal Medicine

## 2011-10-26 VITALS — BP 100/60 | HR 71 | Ht 60.0 in | Wt 226.8 lb

## 2011-10-26 DIAGNOSIS — I429 Cardiomyopathy, unspecified: Secondary | ICD-10-CM

## 2011-10-26 DIAGNOSIS — Z95810 Presence of automatic (implantable) cardiac defibrillator: Secondary | ICD-10-CM

## 2011-10-26 DIAGNOSIS — T82198A Other mechanical complication of other cardiac electronic device, initial encounter: Secondary | ICD-10-CM

## 2011-10-26 DIAGNOSIS — I5022 Chronic systolic (congestive) heart failure: Secondary | ICD-10-CM

## 2011-10-26 LAB — ICD DEVICE OBSERVATION
BATTERY VOLTAGE: 2.69 V
DEV-0020ICD: NEGATIVE
RV LEAD IMPEDENCE ICD: 504 Ohm
TZAT-0001FASTVT: 1
TZAT-0001SLOWVT: 1
TZAT-0002FASTVT: NEGATIVE
TZAT-0004SLOWVT: 8
TZAT-0005SLOWVT: 91 pct
TZAT-0012SLOWVT: 200 ms
TZAT-0012SLOWVT: 200 ms
TZAT-0013SLOWVT: 2
TZAT-0013SLOWVT: 2
TZAT-0020SLOWVT: 1.5 ms
TZON-0004SLOWVT: 16
TZON-0004VSLOWVT: 20
TZST-0001FASTVT: 2
TZST-0001FASTVT: 3
TZST-0001FASTVT: 5
TZST-0001FASTVT: 6
TZST-0001SLOWVT: 5
TZST-0003SLOWVT: 20 J
TZST-0003SLOWVT: 35 J
TZST-0003SLOWVT: 35 J
VENTRICULAR PACING ICD: 0.1 pct

## 2011-10-26 NOTE — Assessment & Plan Note (Signed)
Lead is stable. Have reviewed current recommendations. The patient has a magnet and a letter

## 2011-10-26 NOTE — Assessment & Plan Note (Signed)
The patient's device was interrogated.  The information was reviewed. No changes were made in the programming.    

## 2011-10-26 NOTE — Progress Notes (Signed)
  HPI  Katherine Rosales is a 61 y.o. female (Primary cardiologist is Roderic Palau) Seen in followup for ICD implanted for nonischemic cardiomyopathy and primary prevention. She has chronic systolic heart failure.  The patient denies chest pain, shortness of breath, nocturnal dyspnea, orthopnea or peripheral edema.  There have been no palpitations, lightheadedness or syncope.     Past Medical History  Diagnosis Date  . Systolic heart failure, chronic   . Cardiomyopathy   . ICD (implantable cardiac defibrillator), single, in situ   . Diabetes mellitus     Type II Diabetes    Past Surgical History  Procedure Date  . Breast lumpectomy   . Tonsillectomy   . Tubal ligation   . Pacemaker insertion 06/30/2005    Medtronic Virtuoso VR ICD - Nonischemic cardiomyopathy potentially related to adriamycin, class 3 congestive failture with a narrow QRS and a negative TDI    Current Outpatient Prescriptions  Medication Sig Dispense Refill  . Calcium Carbonate-Vitamin D (CALTRATE 600+D) 600-400 MG-UNIT per tablet Take 1 tablet by mouth 2 (two) times daily.        . carvedilol (COREG) 12.5 MG tablet Take 1.5 tablets (18.75 mg total) by mouth 2 (two) times daily with a meal.  90 tablet  0  . docusate sodium (COLACE) 100 MG capsule Take 100 mg by mouth 2 (two) times daily.        . furosemide (LASIX) 20 MG tablet Takes three tablets weekly.      Marland Kitchen gabapentin (NEURONTIN) 300 MG capsule Take one tablet in the am & two tablets in the pm.      . glimepiride (AMARYL) 2 MG tablet Take 2 mg by mouth daily before breakfast.      . levothyroxine (SYNTHROID, LEVOTHROID) 100 MCG tablet Take 100 mcg by mouth daily.        Marland Kitchen lisinopril (PRINIVIL,ZESTRIL) 20 MG tablet Take 20 mg by mouth daily.        Marland Kitchen loratadine (CLARITIN) 10 MG tablet Take 10 mg by mouth daily.        . Multiple Vitamins-Minerals (CENTRUM SILVER) tablet Take 1 tablet by mouth daily.        Marland Kitchen omeprazole (PRILOSEC) 20 MG capsule Take 20 mg by  mouth daily.        . sertraline (ZOLOFT) 100 MG tablet Take 100 mg by mouth 2 (two) times daily.         No Known Allergies  Review of Systems negative except from HPI and PMH  Physical Exam BP 100/60  Pulse 71  Ht 5' (1.524 m)  Wt 226 lb 12 oz (102.853 kg)  BMI 44.28 kg/m2 Well developed and nourished in no acute distress HENT normal Neck supple  Clear Device pocket well healed; without hematoma or erythema Regular rate and rhythm, no murmurs or gallops Abd-soft with active BS No Clubbing cyanosis edema Skin-warm and dry A & Oriented  Grossly normal sensory and motor function  y    Assessment and  Plan

## 2011-10-26 NOTE — Patient Instructions (Addendum)
Your physician wants you to follow-up in: 1 year with Dr Klein.  You will receive a reminder letter in the mail two months in advance. If you don't receive a letter, please call our office to schedule the follow-up appointment.  

## 2011-10-26 NOTE — Assessment & Plan Note (Signed)
Stable. She can be a candidate for Aldactone. Will defer to primary cardiology service

## 2011-11-11 ENCOUNTER — Other Ambulatory Visit: Payer: Self-pay | Admitting: Cardiology

## 2011-11-11 MED ORDER — CARVEDILOL 12.5 MG PO TABS: 18.7500 mg | ORAL_TABLET | Freq: Two times a day (BID) | ORAL | Status: DC

## 2012-01-31 ENCOUNTER — Encounter: Payer: Medicare Other | Admitting: *Deleted

## 2012-02-08 ENCOUNTER — Encounter: Payer: Self-pay | Admitting: *Deleted

## 2012-02-14 ENCOUNTER — Ambulatory Visit (INDEPENDENT_AMBULATORY_CARE_PROVIDER_SITE_OTHER): Payer: Medicare Other | Admitting: *Deleted

## 2012-02-14 ENCOUNTER — Encounter: Payer: Self-pay | Admitting: Internal Medicine

## 2012-02-14 DIAGNOSIS — Z95810 Presence of automatic (implantable) cardiac defibrillator: Secondary | ICD-10-CM

## 2012-02-14 DIAGNOSIS — I5022 Chronic systolic (congestive) heart failure: Secondary | ICD-10-CM

## 2012-02-14 DIAGNOSIS — I429 Cardiomyopathy, unspecified: Secondary | ICD-10-CM

## 2012-02-18 LAB — REMOTE ICD DEVICE
FVT: 0
PACEART VT: 0
RV LEAD AMPLITUDE: 14.1 mv
RV LEAD IMPEDENCE ICD: 472 Ohm
TOT-0001: 2
TZAT-0001FASTVT: 1
TZAT-0001SLOWVT: 1
TZAT-0001SLOWVT: 2
TZAT-0002FASTVT: NEGATIVE
TZAT-0004SLOWVT: 8
TZAT-0011SLOWVT: 10 ms
TZAT-0012FASTVT: 200 ms
TZAT-0012SLOWVT: 200 ms
TZAT-0013SLOWVT: 2
TZAT-0013SLOWVT: 2
TZAT-0018FASTVT: NEGATIVE
TZAT-0018SLOWVT: NEGATIVE
TZAT-0019SLOWVT: 8 V
TZON-0003SLOWVT: 350 ms
TZON-0004SLOWVT: 40
TZON-0004VSLOWVT: 20
TZST-0001FASTVT: 2
TZST-0001FASTVT: 3
TZST-0001FASTVT: 4
TZST-0001SLOWVT: 4
TZST-0001SLOWVT: 5
TZST-0002FASTVT: NEGATIVE
TZST-0002FASTVT: NEGATIVE
TZST-0003SLOWVT: 20 J
TZST-0003SLOWVT: 35 J
VENTRICULAR PACING ICD: 0 pct
VF: 0

## 2012-02-29 ENCOUNTER — Other Ambulatory Visit: Payer: Self-pay | Admitting: *Deleted

## 2012-02-29 MED ORDER — CARVEDILOL 12.5 MG PO TABS: 18.7500 mg | ORAL_TABLET | Freq: Two times a day (BID) | ORAL | Status: DC

## 2012-03-13 ENCOUNTER — Encounter: Payer: Self-pay | Admitting: *Deleted

## 2012-05-22 ENCOUNTER — Encounter: Payer: Medicare Other | Admitting: *Deleted

## 2012-06-01 ENCOUNTER — Encounter: Payer: Self-pay | Admitting: *Deleted

## 2012-06-09 ENCOUNTER — Other Ambulatory Visit: Payer: Self-pay

## 2012-06-09 ENCOUNTER — Ambulatory Visit (INDEPENDENT_AMBULATORY_CARE_PROVIDER_SITE_OTHER): Payer: Medicare Other | Admitting: *Deleted

## 2012-06-09 DIAGNOSIS — I429 Cardiomyopathy, unspecified: Secondary | ICD-10-CM

## 2012-06-09 DIAGNOSIS — Z95810 Presence of automatic (implantable) cardiac defibrillator: Secondary | ICD-10-CM

## 2012-06-09 DIAGNOSIS — I5022 Chronic systolic (congestive) heart failure: Secondary | ICD-10-CM

## 2012-06-19 LAB — REMOTE ICD DEVICE
BATTERY VOLTAGE: 2.62 V
CHARGE TIME: 12.281 s
FVT: 0
RV LEAD AMPLITUDE: 13.1 mv
TOT-0001: 2
TOT-0002: 0
TOT-0006: 20070328000000
TZAT-0001FASTVT: 1
TZAT-0002FASTVT: NEGATIVE
TZAT-0004SLOWVT: 8
TZAT-0004SLOWVT: 8
TZAT-0011SLOWVT: 10 ms
TZAT-0011SLOWVT: 10 ms
TZAT-0012FASTVT: 200 ms
TZAT-0012SLOWVT: 200 ms
TZAT-0013SLOWVT: 2
TZAT-0018FASTVT: NEGATIVE
TZAT-0018SLOWVT: NEGATIVE
TZAT-0018SLOWVT: NEGATIVE
TZAT-0019FASTVT: 8 V
TZAT-0019SLOWVT: 8 V
TZAT-0020SLOWVT: 1.5 ms
TZON-0003SLOWVT: 350 ms
TZON-0004VSLOWVT: 20
TZON-0005SLOWVT: 12
TZST-0001FASTVT: 2
TZST-0001FASTVT: 5
TZST-0001SLOWVT: 3
TZST-0002FASTVT: NEGATIVE
TZST-0002FASTVT: NEGATIVE
TZST-0002FASTVT: NEGATIVE
TZST-0002FASTVT: NEGATIVE
TZST-0002FASTVT: NEGATIVE
TZST-0003SLOWVT: 20 J
TZST-0003SLOWVT: 35 J
TZST-0003SLOWVT: 35 J
VF: 0

## 2012-07-03 ENCOUNTER — Other Ambulatory Visit: Payer: Self-pay | Admitting: *Deleted

## 2012-07-03 MED ORDER — CARVEDILOL 12.5 MG PO TABS: 18.7500 mg | ORAL_TABLET | Freq: Two times a day (BID) | ORAL | Status: DC

## 2012-07-28 ENCOUNTER — Encounter: Payer: Self-pay | Admitting: *Deleted

## 2012-08-03 ENCOUNTER — Ambulatory Visit (INDEPENDENT_AMBULATORY_CARE_PROVIDER_SITE_OTHER): Payer: Medicare Other | Admitting: Internal Medicine

## 2012-08-03 ENCOUNTER — Encounter: Payer: Self-pay | Admitting: Internal Medicine

## 2012-08-03 VITALS — BP 130/72 | HR 69 | Ht 62.0 in | Wt 234.8 lb

## 2012-08-03 DIAGNOSIS — Z95810 Presence of automatic (implantable) cardiac defibrillator: Secondary | ICD-10-CM

## 2012-08-03 DIAGNOSIS — I429 Cardiomyopathy, unspecified: Secondary | ICD-10-CM

## 2012-08-03 DIAGNOSIS — I5022 Chronic systolic (congestive) heart failure: Secondary | ICD-10-CM

## 2012-08-03 DIAGNOSIS — Z5189 Encounter for other specified aftercare: Secondary | ICD-10-CM

## 2012-08-03 LAB — ICD DEVICE OBSERVATION
DEV-0020ICD: NEGATIVE
TZAT-0001SLOWVT: 1
TZAT-0002FASTVT: NEGATIVE
TZAT-0011SLOWVT: 10 ms
TZAT-0011SLOWVT: 10 ms
TZAT-0013SLOWVT: 2
TZAT-0013SLOWVT: 2
TZAT-0018SLOWVT: NEGATIVE
TZAT-0018SLOWVT: NEGATIVE
TZAT-0019SLOWVT: 8 V
TZAT-0019SLOWVT: 8 V
TZON-0005SLOWVT: 12
TZST-0001FASTVT: 3
TZST-0001FASTVT: 5
TZST-0001SLOWVT: 4
TZST-0001SLOWVT: 6
TZST-0002FASTVT: NEGATIVE
TZST-0002FASTVT: NEGATIVE
TZST-0003SLOWVT: 35 J
TZST-0003SLOWVT: 35 J

## 2012-08-03 NOTE — Assessment & Plan Note (Signed)
The patient's device was interrogated.  The information was reviewed. No changes were made in the programming.    Also we will need to insert a new lead which is associated possible complications of pneumothorax the dislodgment and cardiac perforation  We have reviewed the benefits and risks of generator replacement.  These include but are not limited to lead fracture and infection.  The patient understands, agrees and is willing to proceed.

## 2012-08-03 NOTE — Progress Notes (Signed)
Patient Care Team: Dion Body as PCP - General (Family Medicine)   HPI  Katherine Rosales is a 62 y.o. female Seen in followup for ICD implanted for nonischemic cardiac myopathy and primary prevention. She has chronic systolic failure. Medtronic device has reached ERI.  The patient denies chest pain, shortness of breath is stable and chronic  NO nocturnal dyspnea, orthopnea or peripheral edema.  There have been no palpitations, lightheadedness or syncope.    Past Medical History  Diagnosis Date  . Systolic heart failure, chronic   . Cardiomyopathy   . ICD (implantable cardiac defibrillator), single, in situ   . Diabetes mellitus     Type II Diabetes    Past Surgical History  Procedure Laterality Date  . Breast lumpectomy    . Tonsillectomy    . Tubal ligation    . Pacemaker insertion  06/30/2005    Medtronic Virtuoso VR ICD - Nonischemic cardiomyopathy potentially related to adriamycin, class 3 congestive failture with a narrow QRS and a negative TDI    Current Outpatient Prescriptions  Medication Sig Dispense Refill  . aspirin 81 MG tablet Take 81 mg by mouth daily.      . Calcium Carbonate-Vitamin D (CALTRATE 600+D) 600-400 MG-UNIT per tablet Take 1 tablet by mouth 2 (two) times daily.        . carvedilol (COREG) 12.5 MG tablet Take 1.5 tablets (18.75 mg total) by mouth 2 (two) times daily with a meal.  90 tablet  2  . docusate sodium (COLACE) 100 MG capsule Take 100 mg by mouth 2 (two) times daily.        . furosemide (LASIX) 20 MG tablet Takes three tablets weekly.      Marland Kitchen gabapentin (NEURONTIN) 300 MG capsule Take one tablet in the am & two tablets in the pm.      . glimepiride (AMARYL) 2 MG tablet Take 2 mg by mouth daily before breakfast.      . levothyroxine (SYNTHROID, LEVOTHROID) 100 MCG tablet Take 100 mcg by mouth daily.        Marland Kitchen lisinopril (PRINIVIL,ZESTRIL) 20 MG tablet Take 20 mg by mouth daily.        Marland Kitchen loratadine (CLARITIN) 10 MG tablet Take 10 mg by mouth  daily.        . Multiple Vitamins-Minerals (CENTRUM SILVER) tablet Take 1 tablet by mouth daily.        . Multiple Vitamins-Minerals (OCUVITE PO) Take by mouth daily.      Marland Kitchen omeprazole (PRILOSEC) 20 MG capsule Take 20 mg by mouth daily.        . sertraline (ZOLOFT) 100 MG tablet Take 100 mg by mouth 2 (two) times daily.        No current facility-administered medications for this visit.    No Known Allergies  Review of Systems negative except from HPI and PMH  Physical Exam BP 130/72  Pulse 69  Ht 5' 2"  (1.575 m)  Wt 234 lb 12 oz (106.482 kg)  BMI 42.93 kg/m2 Well developed and nourished in no acute distress HENT normal Neck supple with JVP-flat Clear Regular rate and rhythm, no murmurs or gallops Abd-soft with active BS No Clubbing cyanosis edema Skin-warm and dry A & Oriented  Grossly normal sensory and motor function Device pocket well healed; without hematoma or erythema without superficial anastomoses  ECG demonstrates sinus rhythm at 69 Intervals 17/09/44 Nonspecific ST-T changes  Assessment and  Plan

## 2012-08-03 NOTE — Addendum Note (Signed)
Addended by: Margarette Canada E on: 08/03/2012 04:45 PM   Modules accepted: Orders

## 2012-08-03 NOTE — Assessment & Plan Note (Signed)
We will repeat the ultrasound prior ICD generator replacement. We'll continue her on her current medications. If her LV function remains depressed, I would at Aldactone if renal function permits

## 2012-08-03 NOTE — Patient Instructions (Addendum)
Your physician has requested that you have an echocardiogram prior to chang eout in June. Echocardiography is a painless test that uses sound waves to create images of your heart. It provides your doctor with information about the size and shape of your heart and how well your heart's chambers and valves are working. This procedure takes approximately one hour. There are no restrictions for this procedure.  We will call you with a date in June for generator change out

## 2012-08-03 NOTE — Assessment & Plan Note (Signed)
Lead system require revision at the time of generator replacement. We'll undertake a venogram to see if we can do it is laterally. Superficial veins would suggest that this is the case. Given her relative youth if the vein is occluded, refer for lead extraction

## 2012-08-03 NOTE — Assessment & Plan Note (Signed)
Euvolemic. Continue current medications

## 2012-09-05 ENCOUNTER — Other Ambulatory Visit: Payer: Self-pay

## 2012-09-05 ENCOUNTER — Other Ambulatory Visit (INDEPENDENT_AMBULATORY_CARE_PROVIDER_SITE_OTHER): Payer: Medicare Other

## 2012-09-05 DIAGNOSIS — Z95810 Presence of automatic (implantable) cardiac defibrillator: Secondary | ICD-10-CM

## 2012-09-05 DIAGNOSIS — I5022 Chronic systolic (congestive) heart failure: Secondary | ICD-10-CM

## 2012-09-05 DIAGNOSIS — I429 Cardiomyopathy, unspecified: Secondary | ICD-10-CM

## 2012-09-18 ENCOUNTER — Ambulatory Visit: Payer: Self-pay | Admitting: Family Medicine

## 2012-10-03 ENCOUNTER — Ambulatory Visit: Payer: Self-pay | Admitting: Oncology

## 2012-10-17 ENCOUNTER — Ambulatory Visit: Payer: Self-pay | Admitting: Oncology

## 2012-10-17 LAB — IRON AND TIBC
Iron Saturation: 17 %
Unbound Iron-Bind.Cap.: 282 ug/dL

## 2012-10-17 LAB — CBC CANCER CENTER
Eosinophil %: 4.1 %
Lymphocyte #: 0.9 x10 3/mm — ABNORMAL LOW (ref 1.0–3.6)
Lymphocyte %: 19.9 %
MCHC: 34 g/dL (ref 32.0–36.0)
Monocyte #: 0.4 x10 3/mm (ref 0.2–0.9)
Monocyte %: 8.8 %
Neutrophil #: 3 x10 3/mm (ref 1.4–6.5)
Neutrophil %: 66 %
RDW: 14.8 % — ABNORMAL HIGH (ref 11.5–14.5)

## 2012-10-17 LAB — RETICULOCYTES: Absolute Retic Count: 0.0447 10*6/uL

## 2012-10-31 ENCOUNTER — Telehealth: Payer: Self-pay

## 2012-10-31 DIAGNOSIS — Z45018 Encounter for adjustment and management of other part of cardiac pacemaker: Secondary | ICD-10-CM

## 2012-10-31 DIAGNOSIS — I429 Cardiomyopathy, unspecified: Secondary | ICD-10-CM

## 2012-10-31 NOTE — Telephone Encounter (Signed)
I attempted to call the patient. I left a message I would ask Hilda Blades to call her later this week to schedule her procedure. She saw Dr. [name] in [place] in May and he stated at that time that she would need generator change with lead revision. I am uncertain why this was not scheduled for her before now.

## 2012-10-31 NOTE — Telephone Encounter (Signed)
Pt states she was supposed to receive a call regarding replacing her wiring in defib, and battery replacement. Please call

## 2012-11-01 ENCOUNTER — Encounter: Payer: Self-pay | Admitting: *Deleted

## 2012-11-01 NOTE — Telephone Encounter (Signed)
Follow up  Helene Kelp is calling regarding scheduling appt for Ms Kasper to get her battery replaced. She asked if you could call her back as soon as you get a chance.

## 2012-11-01 NOTE — Telephone Encounter (Signed)
Patient is scheduled for an ICD gen change with lead revision on 11/15/12.  If venogram shows occlusion will be set up for a lead extraction Spoke with patient's niece and she is aware of date and time Pt to get labs in [place] office on 11/08/12

## 2012-11-03 ENCOUNTER — Ambulatory Visit: Payer: Self-pay | Admitting: Oncology

## 2012-11-03 ENCOUNTER — Encounter (HOSPITAL_COMMUNITY): Payer: Self-pay | Admitting: Pharmacy Technician

## 2012-11-08 ENCOUNTER — Ambulatory Visit (INDEPENDENT_AMBULATORY_CARE_PROVIDER_SITE_OTHER): Payer: Medicare Other

## 2012-11-08 DIAGNOSIS — I429 Cardiomyopathy, unspecified: Secondary | ICD-10-CM

## 2012-11-08 DIAGNOSIS — I428 Other cardiomyopathies: Secondary | ICD-10-CM

## 2012-11-08 DIAGNOSIS — Z45018 Encounter for adjustment and management of other part of cardiac pacemaker: Secondary | ICD-10-CM

## 2012-11-09 LAB — CBC WITH DIFFERENTIAL/PLATELET
Basophils Absolute: 0 10*3/uL (ref 0.0–0.2)
Eos: 4 % (ref 0–5)
HCT: 34.4 % (ref 34.0–46.6)
Hemoglobin: 11.8 g/dL (ref 11.1–15.9)
Immature Granulocytes: 0 % (ref 0–2)
Lymphocytes Absolute: 0.9 10*3/uL (ref 0.7–3.1)
MCHC: 34.3 g/dL (ref 31.5–35.7)
MCV: 82 fL (ref 79–97)
Monocytes Absolute: 0.4 10*3/uL (ref 0.1–0.9)
Monocytes: 9 % (ref 4–12)
Neutrophils Absolute: 2.5 10*3/uL (ref 1.4–7.0)
RDW: 14.1 % (ref 12.3–15.4)

## 2012-11-09 LAB — BASIC METABOLIC PANEL
Creatinine, Ser: 1.1 mg/dL — ABNORMAL HIGH (ref 0.57–1.00)
GFR calc Af Amer: 62 mL/min/{1.73_m2} (ref >59)
GFR calc non Af Amer: 54 mL/min/{1.73_m2} — ABNORMAL LOW (ref >59)
Potassium: 4.7 mmol/L (ref 3.5–5.2)
Sodium: 141 mmol/L (ref 134–144)

## 2012-11-14 MED ORDER — SODIUM CHLORIDE 0.9 % IR SOLN
80.0000 mg | Status: DC
  Filled 2012-11-14: qty 2

## 2012-11-14 MED ORDER — CEFAZOLIN SODIUM-DEXTROSE 2-3 GM-% IV SOLR
2.0000 g | INTRAVENOUS | Status: DC
  Filled 2012-11-14: qty 50

## 2012-11-15 ENCOUNTER — Encounter (HOSPITAL_COMMUNITY): Payer: Self-pay | Admitting: Internal Medicine

## 2012-11-15 ENCOUNTER — Encounter (HOSPITAL_COMMUNITY)
Admission: RE | Disposition: A | Discharge status: Home / Self Care | Payer: Self-pay | Source: Ambulatory Visit | Attending: Internal Medicine

## 2012-11-15 ENCOUNTER — Ambulatory Visit (HOSPITAL_COMMUNITY): Payer: Medicare Other

## 2012-11-15 ENCOUNTER — Ambulatory Visit (HOSPITAL_COMMUNITY)
Admission: RE | Admit: 2012-11-15 | Discharge: 2012-11-16 | Disposition: A | Discharge status: Home / Self Care | Payer: Medicare Other | Source: Ambulatory Visit | Attending: Internal Medicine | Admitting: Internal Medicine

## 2012-11-15 DIAGNOSIS — K219 Gastro-esophageal reflux disease without esophagitis: Secondary | ICD-10-CM | POA: Insufficient documentation

## 2012-11-15 DIAGNOSIS — T82198A Other mechanical complication of other cardiac electronic device, initial encounter: Secondary | ICD-10-CM

## 2012-11-15 DIAGNOSIS — I5022 Chronic systolic (congestive) heart failure: Secondary | ICD-10-CM | POA: Insufficient documentation

## 2012-11-15 DIAGNOSIS — I428 Other cardiomyopathies: Secondary | ICD-10-CM | POA: Insufficient documentation

## 2012-11-15 DIAGNOSIS — E039 Hypothyroidism, unspecified: Secondary | ICD-10-CM | POA: Insufficient documentation

## 2012-11-15 DIAGNOSIS — I509 Heart failure, unspecified: Secondary | ICD-10-CM | POA: Insufficient documentation

## 2012-11-15 DIAGNOSIS — E119 Type 2 diabetes mellitus without complications: Secondary | ICD-10-CM | POA: Insufficient documentation

## 2012-11-15 DIAGNOSIS — Z4502 Encounter for adjustment and management of automatic implantable cardiac defibrillator: Secondary | ICD-10-CM | POA: Insufficient documentation

## 2012-11-15 DIAGNOSIS — F439 Reaction to severe stress, unspecified: Secondary | ICD-10-CM

## 2012-11-15 DIAGNOSIS — I429 Cardiomyopathy, unspecified: Secondary | ICD-10-CM

## 2012-11-15 DIAGNOSIS — Z95810 Presence of automatic (implantable) cardiac defibrillator: Secondary | ICD-10-CM

## 2012-11-15 HISTORY — DX: Presence of automatic (implantable) cardiac defibrillator: Z95.810

## 2012-11-15 HISTORY — PX: LEAD REVISION: SHX5945

## 2012-11-15 HISTORY — PX: IMPLANTABLE CARDIOVERTER DEFIBRILLATOR (ICD) GENERATOR CHANGE: SHX5469

## 2012-11-15 HISTORY — DX: Other cardiomyopathies: I42.8

## 2012-11-15 HISTORY — DX: Other mechanical complication of other cardiac electronic device, initial encounter: T82.198A

## 2012-11-15 LAB — GLUCOSE, CAPILLARY: Glucose-Capillary: 104 mg/dL — ABNORMAL HIGH (ref 70–99)

## 2012-11-15 LAB — SURGICAL PCR SCREEN: MRSA, PCR: NEGATIVE

## 2012-11-15 SURGERY — ICD GENERATOR CHANGE
Anesthesia: LOCAL

## 2012-11-15 MED ORDER — DOCUSATE SODIUM 100 MG PO CAPS
100.0000 mg | ORAL_CAPSULE | Freq: Two times a day (BID) | ORAL | Status: DC
  Filled 2012-11-15 (×2): qty 1

## 2012-11-15 MED ORDER — SODIUM CHLORIDE 0.9 % IV SOLN
INTRAVENOUS | Status: DC
Start: 2012-11-15 — End: 2012-11-15

## 2012-11-15 MED ORDER — LIDOCAINE HCL (PF) 1 % IJ SOLN
INTRAMUSCULAR | Status: AC
  Filled 2012-11-15: qty 60

## 2012-11-15 MED ORDER — FENTANYL CITRATE 0.05 MG/ML IJ SOLN
INTRAMUSCULAR | Status: AC
  Filled 2012-11-15: qty 2

## 2012-11-15 MED ORDER — HEPARIN (PORCINE) IN NACL 2-0.9 UNIT/ML-% IJ SOLN
INTRAMUSCULAR | Status: AC
  Filled 2012-11-15: qty 500

## 2012-11-15 MED ORDER — ONDANSETRON HCL 4 MG/2ML IJ SOLN: 4.0000 mg | Freq: Four times a day (QID) | INTRAMUSCULAR | Status: DC | PRN

## 2012-11-15 MED ORDER — CARVEDILOL 6.25 MG PO TABS
18.7500 mg | ORAL_TABLET | Freq: Two times a day (BID) | ORAL | Status: DC
  Administered 2012-11-15 – 2012-11-16 (×2): 18.75 mg via ORAL
  Filled 2012-11-15 (×4): qty 1

## 2012-11-15 MED ORDER — CEFAZOLIN SODIUM 1-5 GM-% IV SOLN
1.0000 g | Freq: Four times a day (QID) | INTRAVENOUS | Status: AC
  Administered 2012-11-15 – 2012-11-16 (×3): 1 g via INTRAVENOUS
  Filled 2012-11-15 (×3): qty 50

## 2012-11-15 MED ORDER — ACETAMINOPHEN 325 MG PO TABS: 325.0000 mg | ORAL_TABLET | ORAL | Status: DC | PRN

## 2012-11-15 MED ORDER — GABAPENTIN 300 MG PO CAPS
300.0000 mg | ORAL_CAPSULE | Freq: Every day | ORAL | Status: DC
  Filled 2012-11-15: qty 1

## 2012-11-15 MED ORDER — LISINOPRIL 20 MG PO TABS
20.0000 mg | ORAL_TABLET | Freq: Every day | ORAL | Status: DC
Start: 2012-11-15 — End: 2012-11-16
  Filled 2012-11-15 (×2): qty 1

## 2012-11-15 MED ORDER — GABAPENTIN 600 MG PO TABS
600.0000 mg | ORAL_TABLET | Freq: Every day | ORAL | Status: DC
  Administered 2012-11-15: 600 mg via ORAL
  Filled 2012-11-15 (×2): qty 1

## 2012-11-15 MED ORDER — MIDAZOLAM HCL 5 MG/5ML IJ SOLN
INTRAMUSCULAR | Status: AC
  Filled 2012-11-15: qty 5

## 2012-11-15 MED ORDER — MUPIROCIN 2 % EX OINT
TOPICAL_OINTMENT | CUTANEOUS | Status: AC
  Filled 2012-11-15: qty 22

## 2012-11-15 MED ORDER — FUROSEMIDE 20 MG PO TABS: 20.0000 mg | ORAL_TABLET | ORAL | Status: DC

## 2012-11-15 MED ORDER — MUPIROCIN 2 % EX OINT
TOPICAL_OINTMENT | Freq: Two times a day (BID) | CUTANEOUS | Status: DC
  Administered 2012-11-15 (×2): via NASAL
  Filled 2012-11-15: qty 22

## 2012-11-15 MED ORDER — SERTRALINE HCL 100 MG PO TABS
100.0000 mg | ORAL_TABLET | Freq: Two times a day (BID) | ORAL | Status: DC
  Administered 2012-11-15: 100 mg via ORAL
  Filled 2012-11-15 (×3): qty 1

## 2012-11-15 MED ORDER — CHLORHEXIDINE GLUCONATE 4 % EX LIQD
60.0000 mL | Freq: Once | CUTANEOUS | Status: DC
  Filled 2012-11-15: qty 60

## 2012-11-15 MED ORDER — SODIUM CHLORIDE 0.9 % IV SOLN
INTRAVENOUS | Status: AC
  Administered 2012-11-15: 11:00:00 via INTRAVENOUS

## 2012-11-15 MED ORDER — LEVOTHYROXINE SODIUM 100 MCG PO TABS
100.0000 ug | ORAL_TABLET | Freq: Every day | ORAL | Status: DC
Start: 2012-11-16 — End: 2012-11-16
  Administered 2012-11-16: 100 ug via ORAL
  Filled 2012-11-15 (×2): qty 1

## 2012-11-15 MED ORDER — SODIUM CHLORIDE 0.9 % IV SOLN
INTRAVENOUS | Status: DC
  Administered 2012-11-15 (×2): 1000 mL via INTRAVENOUS

## 2012-11-15 MED ORDER — LORATADINE 10 MG PO TABS
10.0000 mg | ORAL_TABLET | Freq: Every day | ORAL | Status: DC
  Filled 2012-11-15 (×2): qty 1

## 2012-11-15 MED ORDER — GLIMEPIRIDE 2 MG PO TABS
2.0000 mg | ORAL_TABLET | Freq: Every day | ORAL | Status: DC
  Administered 2012-11-16: 2 mg via ORAL
  Filled 2012-11-15 (×2): qty 1

## 2012-11-15 NOTE — Progress Notes (Signed)
Pt transferred from cath lab to 2W33. Sling applied to L arm. Incision site is CDI.  VSS. CXR to be obtained. NS increased to 50 ml/hr. Clears given to pt and tolerated well. Diet advanced. Pt instructed to use call bell for any needs. Verbalized understanding. Will continue to monitor pt closely.

## 2012-11-15 NOTE — CV Procedure (Signed)
Katherine Rosales [id number]  [id number]  Preop QM:VHQIONGE ICD at [facility] with [phone] lead on recall  In need of replacement Postop Dx same/ patent L [place] vein  Procedure: contrast venogram, device explant, device implant, ICD lead insertion    Following the obtaining of informed consent the patient was brought to the electrophysiology laboratory in place of the fluoroscopic table in the supine position. Contrast venogram demostrated patency of LSC vein   After routine prep and drape, lidocaine was infiltrated in the prepectoral subclavicular region and an incision was made in region of other incision and carried down to the layer of the prepectoral fascia using electrocautery and sharp dissection. The pocket was opened, the device explanted and leads capped   Thereafter an attention was turned to gain access to the extrathoracic left subclavian vein which was accomplished without difficulty and without the aspiration of air or puncture of the artery. A 9 French sheath was placed for which was then passed a  Single coil  active fixation defibrillator lead, model Medtronic [id number] serial number [id number]. It was  passed under fluoroscopic guidance to the right ventricular apex. In its location the bipolar R wave was 9 millivolts, impedance was 526 ohms, the pacing threshold was 0.9 volts at 0.5 msec. Current at threshold was .6 mA.  There was no diaphragmatic pacing at 10 V. The current of injury was minimal;  However because of the weight of the lead the position was accepted.  The lead was secured to the prepectoral fascia and then attached to a Medtronic Evera ICD, serial number  [id number]  Through the device, the bipolar R wave was 9 millivolts, impedance was 513 ohms, the pacing threshold was 0.5 volts at 0.4 msec. High-voltage impedance of 87 ohms.  The pocket was copiously irrigated with antibiotic containing saline solution. Hemostasis was assured, and the device and the lead was placed in the pocket and  secured to the prepectoral fascia with the old lead behind teh can  The wound is closed in 3 layers in normal fashion. The wound is washed dried and a benzoin Steri-Strips dressing was then applied. Needle counts sponge counts and instrument counts were correct at the end of the procedure according to the staff.   Patient tolerated the procedure without apparent complication      Cx: None      [name], MD 11/15/2012 10:12 AM

## 2012-11-15 NOTE — Interval H&P Note (Signed)
ICD Criteria  Current LVEF (within 6 months):25%  NYHA Functional Classification: Class III  Heart Failure History:  Yes, Duration of heart failure since onset is > 9 months  Non-Ischemic Dilated Cardiomyopathy History:  Yes, timeframe is > 9 months  Atrial Fibrillation/Atrial Flutter:  No.  Ventricular Tachycardia History:  No.  Cardiac Arrest History:  No  History of Syndromes with Risk of Sudden Death:  No.  Previous ICD:  Yes, ICD Type:  Single, Reason for ICD:  Primary prevention.  10%  Electrophysiology Study: No.  Anticoagulation Therapy:  Patient is NOT on anticoagulation therapy.   Beta Blocker Therapy:  Yes.   Ace Inhibitor/ARB Therapy:  Yes.History and Physical Interval Note:  11/15/2012 8:12 AM  Katherine Rosales  has presented today for surgery, with the diagnosis of end of life  The various methods of treatment have been discussed with the patient and family. After consideration of risks, benefits and other options for treatment, the patient has consented to  Procedure(s): ICD GENERATOR CHANGE (N/A) LEAD REVISION (N/A) as a surgical intervention .  The patient's history has been reviewed, patient examined, no change in status, stable for surgery.  I have reviewed the patient's chart and labs.  Questions were answered to the patient's satisfaction.     [name]

## 2012-11-15 NOTE — H&P (Signed)
Patient Care Team: Dion Body as PCP - General (Family Medicine)   HPI  Katherine Rosales is a 62 y.o. female wth previdously implanted ICD with Medtronic [phone] lead  She has reached ERI She is here today for revision of generator and lead  She has hx of nonischemic cardiomyopathy, mod TR/MR> mostly resolved by last echo Last echo interval EF 25-30%  6/14;  LAE-52  ECG >>NSR  Stable class 2b-3 CHF with occ edema, DOE< 1 flt of stairs, no PND or orthopnea     Past Medical History  Diagnosis Date  . Systolic heart failure, chronic   . Cardiomyopathy   . ICD (implantable cardiac defibrillator), single, in situ   . Diabetes mellitus     Type II Diabetes    Past Surgical History  Procedure Laterality Date  . Breast lumpectomy    . Tonsillectomy    . Tubal ligation    . Pacemaker insertion  06/30/2005    Medtronic Virtuoso VR ICD - Nonischemic cardiomyopathy potentially related to adriamycin, class 3 congestive failture with a narrow QRS and a negative TDI    Current Facility-Administered Medications  Medication Dose Route Frequency Provider Last Rate Last Dose  . 0.9 %  sodium chloride infusion   Intravenous Continuous Deboraha Sprang, MD 50 mL/hr at 11/15/12 0752 1,000 mL at 11/15/12 0752  . 0.9 %  sodium chloride infusion   Intravenous Continuous Deboraha Sprang, MD      . ceFAZolin (ANCEF) IVPB 2 g/50 mL premix  2 g Intravenous On Call Deboraha Sprang, MD      . chlorhexidine (HIBICLENS) 4 % liquid 4 application  60 mL Topical Once Deboraha Sprang, MD      . gentamicin (GARAMYCIN) 80 mg in sodium chloride irrigation 0.9 % 500 mL irrigation  80 mg Irrigation On Call Deboraha Sprang, MD      . mupirocin ointment Drue Stager) 2 %   Nasal BID Deboraha Sprang, MD        No Known Allergies  Review of Systems negative except from HPI and PMH  Physical Exam BP 123/47  Pulse 70  Temp(Src) 98 F (36.7 C) (Oral)  Resp 18  Ht 4' 9"  (1.448 m)  Wt 230 lb (104.327 kg)  BMI  49.76 kg/m2  SpO2 99% Well developed and nourished in no acute distress HENT normal Neck supple with JVP-flat Device pocket well healed; without hematoma or erythema.  There is no tethering  Clear Regular rate and rhythm, no murmurs or gallops Abd-soft with active BS No Clubbing cyanosis edema Skin-warm and dry A & Oriented  Grossly normal sensory and motor function  ECG NSR with narrow QRS  Assessment and  Plan   NICM  ICD  6949 Lead  Chronic systolic CHF  We have reviewed the benefits and risks of generator replacement.  These include but are not limited to lead fracture and infection.  The patient understands, agrees and is willing to proceed.   Also will plan venogram and new lead either today or via extraction to replace the  [phone]

## 2012-11-16 DIAGNOSIS — Z95810 Presence of automatic (implantable) cardiac defibrillator: Secondary | ICD-10-CM

## 2012-11-16 LAB — GLUCOSE, CAPILLARY

## 2012-11-16 NOTE — Progress Notes (Signed)
Patient: Katherine Rosales Date of Encounter: 11/16/2012, 7:34 AM Admit date: 11/15/2012     Subjective  Katherine Rosales has no complaints. She denies CP or SOB. She is eager to go home.   Objective  Physical Exam: Vitals: BP 144/74  Pulse 73  Temp(Src) 98.4 F (36.9 C) (Oral)  Resp 17  Ht 4' 9"  (1.448 m)  Wt 230 lb (104.327 kg)  BMI 49.76 kg/m2  SpO2 93% General: Well developed, well appearing 62 year old female in no acute distress. Neck: Supple. Difficult to assess JVD. Lungs: Clear bilaterally to auscultation without wheezes, rales, or rhonchi. Breathing is unlabored. Heart: RRR S1 S2 without murmurs, rubs, or gallops.  Abdomen: Large but soft, non-distended. Extremities: No clubbing or cyanosis. No edema.  Distal pedal pulses are 2+ and equal bilaterally. Neuro: Alert and oriented X 3. Moves all extremities spontaneously. No focal deficits. Skin: Left upper chest / implant site intact without significant bleeding or hematoma.  Intake/Output: No intake or output data in the 24 hours ending 11/16/12 0734  Inpatient Medications:  . carvedilol  18.75 mg Oral BID WC  . docusate sodium  100 mg Oral BID  . [START ON 11/17/2012] furosemide  20 mg Oral Custom  . gabapentin  300 mg Oral Daily  . gabapentin  600 mg Oral QHS  . glimepiride  2 mg Oral QAC breakfast  . levothyroxine  100 mcg Oral QAC breakfast  . lisinopril  20 mg Oral Daily  . loratadine  10 mg Oral Daily  . mupirocin ointment   Nasal BID  . sertraline  100 mg Oral BID    Labs: CBC    Component Value Date/Time   WBC 4.0 11/08/2012 1029   RBC 4.18 11/08/2012 1029   HGB 11.8 11/08/2012 1029   HCT 34.4 11/08/2012 1029   MCV 82 11/08/2012 1029   MCH 28.2 11/08/2012 1029   MCHC 34.3 11/08/2012 1029   RDW 14.1 11/08/2012 1029   LYMPHSABS 0.9 11/08/2012 1029   EOSABS 0.2 11/08/2012 1029   BASOSABS 0.0 11/08/2012 1029  BMET    Component Value Date/Time   NA 141 11/08/2012 1029   K 4.7 11/08/2012 1029   CL 102 11/08/2012 1029   CO2  21 11/08/2012 1029   GLUCOSE 121* 11/08/2012 1029   BUN 21 11/08/2012 1029   CREATININE 1.10* 11/08/2012 1029   CALCIUM 9.7 11/08/2012 1029   GFRNONAA 54* 11/08/2012 1029   GFRAA 62 11/08/2012 1029    Radiology/Studies: Dg Chest 2 View  11/15/2012   *RADIOLOGY REPORT*  Clinical Data: Post implant, generator change.  CHEST - 2 VIEW  Comparison: 07/01/2005.  Findings: Trachea is midline.  Heart size stable.  ICD lead tips are seen from a left subclavian approach with tips projecting over the right ventricle.  No pneumothorax.  Lungs are low in volume but clear.  No pleural fluid.  Possible mild anterior wedging of a lower thoracic vertebral body, unchanged.  IMPRESSION: ICD placement without complicating feature.   Original Report Authenticated By: Lorin Picket, M.D.    Telemetry: SR; no arrhythmias Device interrogation post procedure yesterday - performed by industry - shows normal ICD function with stable lead measurements   Assessment and Plan  1. NICM s/p prior ICD implant, recent ICD battery at [facility] and [phone] lead on recall, now s/p ICD generator change and lead revision  Katherine Rosales is doing well. She has no complaints. CXR shows stable lead placement and no PTX. Device interrogation  shows stable lead measurements. Wound is intact without significant bleeding or hematoma. Reviewed DC instructions and follow-up. DC home today. Wound check in one week.  Dr. [name] to see Signed, Ileene Hutchinson PA-C Agree with above

## 2012-11-16 NOTE — Discharge Summary (Signed)
ELECTROPHYSIOLOGY DISCHARGE SUMMARY    Patient ID: Katherine Rosales,  MRN: [id number], DOB/AGE: [birth year] 62 y.o.  Admit date: 11/15/2012 Discharge date: 11/16/2012  Primary Care Physician: Dion Body, MD  Primary Cardiologist / EP: Roderic Palau, MD / Jolyn Nap, MD  Primary Discharge Diagnosis:  1. NICM s/p prior ICD implant, recent ICD battery at [facility] and [phone] lead on recall, now s/p ICD generator change and lead revision  Secondary Discharge Diagnoses:  1. Chronic systolic HF 2. DM 3. Hypothyroidism 4. GERD  Procedures This Admission:  1. ICD generator change and RV lead revision New RV lead - Single coil active fixation defibrillator lead, model Medtronic [phone] serial number [id number] Device - Medtronic Evera ICD, serial number [id number]   History and Hospital Course:  Katherine Rosales is a 62 year old woman with a NICM s/p ICD implant in 2007 for primary prevention SCD. Recently, her ICD battery was found to be at [facility]. In addition, her RV 6949 lead is on recall. Therefore, she presented yesterday and underwent ICD generator change with RV lead revision. Katherine Rosales tolerated this procedure well without any immediate complication. She remains hemodynamically stable and afebrile. Her chest xray shows stable lead placement without pneumothorax. Her device interrogation shows normal ICD function with stable lead parameters/measurements. Her implant site is intact without significant bleeding or hematoma. She has been given discharge instructions including wound care and activity restrictions. She will follow-up in 10 days for wound check. There were no changes made to her medications. She has been seen, examined and deemed stable for discharge today by Dr. Jolyn Nap.  Discharge Vitals: Blood pressure 144/74, pulse 73, temperature 98.4 F (36.9 C), temperature source Oral, resp. rate 17, height 4' 9"  (1.448 m), weight 230 lb (104.327 kg), SpO2 93.00%.   Labs: CBC    Component  Value Date/Time   WBC 4.0 11/08/2012 1029   RBC 4.18 11/08/2012 1029   HGB 11.8 11/08/2012 1029   HCT 34.4 11/08/2012 1029   MCV 82 11/08/2012 1029   MCH 28.2 11/08/2012 1029   MCHC 34.3 11/08/2012 1029   RDW 14.1 11/08/2012 1029   LYMPHSABS 0.9 11/08/2012 1029   EOSABS 0.2 11/08/2012 1029   BASOSABS 0.0 11/08/2012 1029  BMET    Component Value Date/Time   NA 141 11/08/2012 1029   K 4.7 11/08/2012 1029   CL 102 11/08/2012 1029   CO2 21 11/08/2012 1029   GLUCOSE 121* 11/08/2012 1029   BUN 21 11/08/2012 1029   CREATININE 1.10* 11/08/2012 1029   CALCIUM 9.7 11/08/2012 1029   GFRNONAA 54* 11/08/2012 1029   GFRAA 62 11/08/2012 1029    Disposition:  The patient is being discharged in stable condition.  Follow-up: Follow-up Information   Follow up with [facility] On 11/27/2012. (At 3:00 PM for wound check)    Specialty:  Electrophysiology   Contact information:   [address], [address] [place] [address] [phone]      Follow up with [name], MD On 02/23/2013. (At 9:30 AM)    Specialty:  Cardiology   Contact information:   [place] [address] [phone]      Discharge Medications:    Medication List         aspirin 81 MG tablet  Take 81 mg by mouth daily.     CALTRATE 600+D 600-400 MG-UNIT per tablet  Generic drug:  Calcium Carbonate-Vitamin D  Take 1 tablet by  mouth 2 (two) times daily.     carvedilol 12.5 MG tablet  Commonly known as:  COREG  Take 1.5 tablets (18.75 mg total) by mouth 2 (two) times daily with a meal.     CENTRUM SILVER tablet  Take 1 tablet by mouth daily.     OCUVITE PO  Take by mouth daily.     docusate sodium 100 MG capsule  Commonly known as:  COLACE  Take 100 mg by mouth 2 (two) times daily.     furosemide 20 MG tablet  Commonly known as:  LASIX  Take 20 mg by mouth See admin instructions. Takes three tablets weekly. Sundays wednesdays and Friday     gabapentin 300 MG capsule    Commonly known as:  NEURONTIN  Take 300-600 mg by mouth 2 (two) times daily. Takes 1 capsule (352m) in a.m. and 2 capsules (6042m in p.m.     glimepiride 2 MG tablet  Commonly known as:  AMARYL  Take 2 mg by mouth daily before breakfast.     levothyroxine 100 MCG tablet  Commonly known as:  SYNTHROID, LEVOTHROID  Take 100 mcg by mouth daily.     lisinopril 20 MG tablet  Commonly known as:  PRINIVIL,ZESTRIL  Take 20 mg by mouth daily.     loratadine 10 MG tablet  Commonly known as:  CLARITIN  Take 10 mg by mouth daily.     omeprazole 20 MG capsule  Commonly known as:  PRILOSEC  Take 20 mg by mouth daily.     sertraline 100 MG tablet  Commonly known as:  ZOLOFT  Take 100 mg by mouth 2 (two) times daily.       Duration of Discharge Encounter: Greater than 30 minutes including physician time.  Signed, EDIleene HutchinsonPA-C 11/16/2012, 10:41 AM

## 2012-11-16 NOTE — Progress Notes (Signed)
Discharged to home with family office visits in place teaching done  

## 2012-11-27 ENCOUNTER — Ambulatory Visit (INDEPENDENT_AMBULATORY_CARE_PROVIDER_SITE_OTHER): Payer: Medicare Other | Admitting: *Deleted

## 2012-11-27 ENCOUNTER — Encounter: Payer: Self-pay | Admitting: Internal Medicine

## 2012-11-27 DIAGNOSIS — Z95810 Presence of automatic (implantable) cardiac defibrillator: Secondary | ICD-10-CM

## 2012-11-27 DIAGNOSIS — I429 Cardiomyopathy, unspecified: Secondary | ICD-10-CM

## 2012-11-27 NOTE — Progress Notes (Signed)
Pt seen in device clinic for follow up of recently replaced ICD & new RV lead (8350 was abandoned).  Wound well healed.  No redness, swelling, or edema.  Steri-strips prior to arrival.   Device interrogated and found to be functioning normally.  No changes made today. See PaceArt for full details.  Pt denies chest pain, shortness of breath, palpitations, or dizziness.  Pt to follow up with Dr. [name] 02/23/13.   Katherine Rosales 11/27/2012 3:06 PM

## 2012-11-28 ENCOUNTER — Other Ambulatory Visit: Payer: Self-pay | Admitting: *Deleted

## 2012-11-28 MED ORDER — CARVEDILOL 12.5 MG PO TABS: 18.7500 mg | ORAL_TABLET | Freq: Two times a day (BID) | ORAL | Status: DC

## 2012-11-29 LAB — ICD DEVICE OBSERVATION
DEV-0020ICD: NEGATIVE
HV IMPEDENCE: 58 Ohm
TZAT-0001SLOWVT: 1
TZAT-0001SLOWVT: 3
TZAT-0018SLOWVT: NEGATIVE
TZAT-0018SLOWVT: NEGATIVE
TZON-0003SLOWVT: 350.8 ms
TZON-0004SLOWVT: 40
TZST-0001SLOWVT: 5
TZST-0003SLOWVT: 20 J
TZST-0003SLOWVT: 35 J
TZST-0003SLOWVT: 35 J
VENTRICULAR PACING ICD: 0.1 pct

## 2012-12-08 ENCOUNTER — Encounter: Payer: Self-pay | Admitting: *Deleted

## 2013-02-23 ENCOUNTER — Encounter: Payer: Self-pay | Admitting: Internal Medicine

## 2013-02-23 ENCOUNTER — Ambulatory Visit (INDEPENDENT_AMBULATORY_CARE_PROVIDER_SITE_OTHER): Payer: Medicare Other | Admitting: Internal Medicine

## 2013-02-23 ENCOUNTER — Other Ambulatory Visit: Payer: Self-pay | Admitting: Internal Medicine

## 2013-02-23 VITALS — BP 135/84 | HR 65 | Ht 60.0 in | Wt 235.5 lb

## 2013-02-23 DIAGNOSIS — Z733 Stress, not elsewhere classified: Secondary | ICD-10-CM

## 2013-02-23 DIAGNOSIS — F439 Reaction to severe stress, unspecified: Secondary | ICD-10-CM

## 2013-02-23 DIAGNOSIS — I429 Cardiomyopathy, unspecified: Secondary | ICD-10-CM

## 2013-02-23 DIAGNOSIS — Z95810 Presence of automatic (implantable) cardiac defibrillator: Secondary | ICD-10-CM

## 2013-02-23 DIAGNOSIS — I5022 Chronic systolic (congestive) heart failure: Secondary | ICD-10-CM

## 2013-02-23 LAB — MDC_IDC_ENUM_SESS_TYPE_INCLINIC
Battery Remaining Longevity: 134 mo
Brady Statistic RV Percent Paced: 1 %
HighPow Impedance: 66 Ohm
Lead Channel Pacing Threshold Amplitude: 0.5 V
Lead Channel Pacing Threshold Pulse Width: 0.4 ms
Lead Channel Setting Pacing Amplitude: 3.5 V
Lead Channel Setting Pacing Pulse Width: 0.4 ms

## 2013-02-23 NOTE — Assessment & Plan Note (Signed)
The patient's device was interrogated.  The information was reviewed. No changes were made in the programming.    

## 2013-02-23 NOTE — Assessment & Plan Note (Signed)
We discussed various strategies that she may be able to utilize to be in any distress in her dependent/codependent situation with her mom and dad

## 2013-02-23 NOTE — Assessment & Plan Note (Signed)
euvolemic contniue current meds

## 2013-02-23 NOTE — Patient Instructions (Addendum)
Your physician recommends that you continue on your current medications as directed. Please refer to the Current Medication list given to you today.  Remote monitoring is used to monitor your Pacemaker of ICD from home. This monitoring reduces the number of office visits required to check your device to one time per year. It allows [place] to keep an eye on the functioning of your device to ensure it is working properly. You are scheduled for a device check from home on 05/28/2013. You may send your transmission at any time that day. If you have a wireless device, the transmission will be sent automatically. After your physician reviews your transmission, you will receive a postcard with your next transmission date.  Your physician wants you to follow-up in: 9 months with Dr. [name] (August 2015). You will receive a reminder letter in the mail two months in advance. If you don't receive a letter, please call our office to schedule the follow-up appointment.

## 2013-02-23 NOTE — Assessment & Plan Note (Signed)
Continue current medications. 

## 2013-02-23 NOTE — Progress Notes (Signed)
Patient Care Team: Dion Body as PCP - General (Family Medicine)   HPI  Katherine Rosales is a 62 y.o. female Seen in followup for ICD implanted for nonischemic cardiac myopathy and primary prevention. She has chronic systolic failure. Medtronic device   reached ERI and she underwent generator replacement in 8/14  The patient denies chest pain, shortness of breath is stable and chronic  NO nocturnal dyspnea, orthopnea or peripheral edema.  There have been no palpitations, lightheadedness or syncope.   Her major issue remains stress related to the care of her elderly independent/to dependent parents Past Medical History  Diagnosis Date  . Systolic heart failure, chronic   . Cardiomyopathy, nonischemic   . ICD (implantable cardiac defibrillator), singleMedtronic   . Diabetes mellitus     Type II Diabetes  . 3716 Lead-Medtronic   . Automatic implantable cardioverter-defibrillator in situ     Past Surgical History  Procedure Laterality Date  . Breast lumpectomy    . Tonsillectomy    . Tubal ligation    . Pacemaker insertion  06/30/2005    Medtronic Virtuoso VR ICD - Nonischemic cardiomyopathy potentially related to adriamycin, class 3 congestive failture with a narrow QRS and a negative TDI  . Cholecystectomy    . Joint replacement  2009    Total Knee Replacement, Left    Current Outpatient Prescriptions  Medication Sig Dispense Refill  . aspirin 81 MG tablet Take 81 mg by mouth daily.      . Calcium Carbonate-Vitamin D (CALTRATE 600+D) 600-400 MG-UNIT per tablet Take 1 tablet by mouth 2 (two) times daily.        . carvedilol (COREG) 12.5 MG tablet Take 1.5 tablets (18.75 mg total) by mouth 2 (two) times daily with a meal.  90 tablet  2  . docusate sodium (COLACE) 100 MG capsule Take 100 mg by mouth 2 (two) times daily.        . furosemide (LASIX) 20 MG tablet Take 20 mg by mouth See admin instructions. Takes three tablets weekly. Sundays wednesdays and Friday      .  gabapentin (NEURONTIN) 300 MG capsule Take 300-600 mg by mouth 2 (two) times daily. Takes 1 capsule (34m) in a.m. and 2 capsules (6068m in p.m.      . glimepiride (AMARYL) 2 MG tablet Take 2 mg by mouth daily before breakfast.      . levothyroxine (SYNTHROID, LEVOTHROID) 100 MCG tablet Take 100 mcg by mouth daily.        . Marland Kitchenisinopril (PRINIVIL,ZESTRIL) 20 MG tablet Take 20 mg by mouth daily.        . Marland Kitchenoratadine (CLARITIN) 10 MG tablet Take 10 mg by mouth daily.        . Multiple Vitamins-Minerals (CENTRUM SILVER) tablet Take 1 tablet by mouth daily.        . Multiple Vitamins-Minerals (OCUVITE PO) Take by mouth daily.      . Marland Kitchenmeprazole (PRILOSEC) 20 MG capsule Take 20 mg by mouth daily.        . sertraline (ZOLOFT) 100 MG tablet Take 100 mg by mouth 2 (two) times daily.        No current facility-administered medications for this visit.    Not on File  Review of Systems negative except from HPI and PMH  Physical Exam BP 135/84  Pulse 65  Ht 5' (1.524 m)  Wt 235 lb 8 oz (106.822 kg)  BMI 45.99 kg/m2 Well developed and nourished in no acute distress HENT  normal Neck supple with JVP-flat Clear Regular rate and rhythm, no murmurs or gallops Abd-soft with active BS No Clubbing cyanosis edema Skin-warm and dry A & Oriented  Grossly normal sensory and motor function Device pocket well healed; without hematoma or erythema without superficial anastomoses  ECG demonstrates sinus rhythm at 69 Intervals 17/09/44 Nonspecific ST-T changes  Assessment and  Plan

## 2013-03-05 ENCOUNTER — Ambulatory Visit: Payer: Self-pay | Admitting: Unknown Physician Specialty

## 2013-03-05 HISTORY — PX: COLONOSCOPY: SHX174

## 2013-03-05 HISTORY — PX: COLONOSCOPY W/ POLYPECTOMY: SHX1380

## 2013-03-06 LAB — PATHOLOGY REPORT

## 2013-05-18 ENCOUNTER — Other Ambulatory Visit: Payer: Self-pay | Admitting: Internal Medicine

## 2013-05-18 ENCOUNTER — Ambulatory Visit (INDEPENDENT_AMBULATORY_CARE_PROVIDER_SITE_OTHER): Payer: Medicare Other | Admitting: *Deleted

## 2013-05-18 ENCOUNTER — Telehealth: Payer: Self-pay | Admitting: Internal Medicine

## 2013-05-18 DIAGNOSIS — Z95810 Presence of automatic (implantable) cardiac defibrillator: Secondary | ICD-10-CM

## 2013-05-18 NOTE — Telephone Encounter (Signed)
New Message  Pt called.  States she was in the basement and when she bent over and her Defib began to alarm.. She states that she then walked up the stairs and the alarm went ceased... EMS came out and advised that she was fine.. She is requesting a call back to discuss.

## 2013-05-18 NOTE — Telephone Encounter (Signed)
Pt's high urgency alert tone notified pt while I was on phone w/ her. Pt attempted to send transmission but had her old monitor still plugged. She has new monitor but had not connected it yet. I walked pt through instructions to connect new monitor. Transmission successfully received. Alert tone is due to HV impedance measuring 101 ohms. I asked pt to come to clinic so we can reprogram the HV impedance safety window. Pt will come this afternoon.

## 2013-05-18 NOTE — Progress Notes (Signed)
Pt high urgency alert tone notified her due to HV impedance at 101 ohms---confirmed through [facility]. Changed upper limit notifier from 100 to 130 ohms.   Normal device function. Thresholds and sensing consistent with previous device measurements. Impedance trends stable over time. No evidence of any ventricular arrhythmias. Histogram distribution appropriate for patient and level of activity. Device programmed at appropriate safety margins. Device programmed to optimize intrinsic conduction. Estimated longevity 11.73yr. Next [facility]link 05/28/13 & ROV w/ SK/B 11/2013.

## 2013-05-18 NOTE — Telephone Encounter (Signed)
Left message for patient to send manual transmission when she is able.  Also noted that no alerts were seen on [facility].

## 2013-05-18 NOTE — Telephone Encounter (Signed)
VOICEMAIL MESSAGE FROM [employer], PT HAD AN ALERT ON TRANSMISSION

## 2013-05-23 LAB — MDC_IDC_ENUM_SESS_TYPE_INCLINIC
Battery Remaining Longevity: 134 mo
Battery Voltage: 3.05 V
Brady Statistic RV Percent Paced: 0.01 %
Date Time Interrogation Session: 20150213233257
HighPow Impedance: 101 Ohm
HighPow Impedance: 190 Ohm
Lead Channel Impedance Value: 494 Ohm
Lead Channel Pacing Threshold Amplitude: 0.625 V
Lead Channel Pacing Threshold Pulse Width: 0.4 ms
Lead Channel Sensing Intrinsic Amplitude: 14.25 mV
Lead Channel Sensing Intrinsic Amplitude: 15.25 mV
Lead Channel Setting Pacing Amplitude: 2 V
Lead Channel Setting Pacing Pulse Width: 0.4 ms
Lead Channel Setting Sensing Sensitivity: 0.3 mV
Zone Setting Detection Interval: 290 ms
Zone Setting Detection Interval: 350 ms
Zone Setting Detection Interval: 450 ms

## 2013-05-28 ENCOUNTER — Encounter: Payer: Medicare Other | Admitting: *Deleted

## 2013-06-06 ENCOUNTER — Other Ambulatory Visit: Payer: Self-pay

## 2013-06-06 MED ORDER — CARVEDILOL 12.5 MG PO TABS: 18.7500 mg | ORAL_TABLET | Freq: Two times a day (BID) | ORAL | Status: DC

## 2013-06-12 ENCOUNTER — Encounter: Payer: Self-pay | Admitting: *Deleted

## 2013-06-18 ENCOUNTER — Encounter: Payer: Self-pay | Admitting: Internal Medicine

## 2013-08-16 ENCOUNTER — Ambulatory Visit (INDEPENDENT_AMBULATORY_CARE_PROVIDER_SITE_OTHER): Payer: Commercial Managed Care - HMO | Admitting: *Deleted

## 2013-08-16 ENCOUNTER — Telehealth: Payer: Self-pay | Admitting: Cardiology

## 2013-08-16 DIAGNOSIS — I5022 Chronic systolic (congestive) heart failure: Secondary | ICD-10-CM

## 2013-08-16 DIAGNOSIS — Z95810 Presence of automatic (implantable) cardiac defibrillator: Secondary | ICD-10-CM

## 2013-08-16 DIAGNOSIS — I429 Cardiomyopathy, unspecified: Secondary | ICD-10-CM

## 2013-08-16 NOTE — Telephone Encounter (Signed)
Spoke with pt and reminded pt of remote transmission that is due today. Pt verbalized understanding.   

## 2013-08-17 LAB — MDC_IDC_ENUM_SESS_TYPE_REMOTE
Battery Remaining Longevity: 132 mo
Battery Voltage: 3.05 V
Date Time Interrogation Session: 20150515013837
HIGH POWER IMPEDANCE MEASURED VALUE: 80 Ohm
HighPow Impedance: 171 Ohm
Lead Channel Impedance Value: 494 Ohm
Lead Channel Sensing Intrinsic Amplitude: 14.75 mV
Lead Channel Sensing Intrinsic Amplitude: 14.75 mV
Lead Channel Setting Pacing Amplitude: 2 V
MDC IDC MSMT LEADCHNL RV PACING THRESHOLD AMPLITUDE: 0.625 V
MDC IDC MSMT LEADCHNL RV PACING THRESHOLD PULSEWIDTH: 0.4 ms
MDC IDC SET LEADCHNL RV PACING PULSEWIDTH: 0.4 ms
MDC IDC SET LEADCHNL RV SENSING SENSITIVITY: 0.3 mV
MDC IDC SET ZONE DETECTION INTERVAL: 350 ms
MDC IDC SET ZONE DETECTION INTERVAL: 450 ms
MDC IDC STAT BRADY RV PERCENT PACED: 0.01 %
Zone Setting Detection Interval: 290 ms

## 2013-08-17 NOTE — Progress Notes (Signed)
Remote ICD transmission.   

## 2013-09-07 ENCOUNTER — Encounter: Payer: Self-pay | Admitting: Cardiology

## 2013-09-13 ENCOUNTER — Encounter: Payer: Self-pay | Admitting: Internal Medicine

## 2013-10-10 ENCOUNTER — Other Ambulatory Visit: Payer: Self-pay | Admitting: *Deleted

## 2013-10-10 MED ORDER — CARVEDILOL 12.5 MG PO TABS: 18.7500 mg | ORAL_TABLET | Freq: Two times a day (BID) | ORAL | Status: DC

## 2013-10-10 NOTE — Telephone Encounter (Signed)
Requested Prescriptions   Signed Prescriptions Disp Refills  . carvedilol (COREG) 12.5 MG tablet 90 tablet 4    Sig: Take 1.5 tablets (18.75 mg total) by mouth 2 (two) times daily with a meal.    Authorizing Provider: Deboraha Sprang    Ordering User: Britt Bottom

## 2013-10-25 ENCOUNTER — Ambulatory Visit: Payer: Self-pay | Admitting: Oncology

## 2013-11-02 ENCOUNTER — Ambulatory Visit: Payer: Self-pay | Admitting: Oncology

## 2013-11-06 ENCOUNTER — Ambulatory Visit: Payer: Self-pay | Admitting: Oncology

## 2013-11-06 ENCOUNTER — Ambulatory Visit: Payer: Self-pay | Admitting: Family Medicine

## 2013-11-06 LAB — CBC CANCER CENTER
Basophil #: 0 x10 3/mm (ref 0.0–0.1)
Basophil %: 0.7 %
EOS PCT: 5.7 %
Eosinophil #: 0.3 x10 3/mm (ref 0.0–0.7)
HCT: 37.6 % (ref 35.0–47.0)
HGB: 12.2 g/dL (ref 12.0–16.0)
LYMPHS ABS: 1 x10 3/mm (ref 1.0–3.6)
LYMPHS PCT: 20.2 %
MCH: 28.8 pg (ref 26.0–34.0)
MCHC: 32.5 g/dL (ref 32.0–36.0)
MCV: 89 fL (ref 80–100)
Monocyte #: 0.4 x10 3/mm (ref 0.2–0.9)
Monocyte %: 8.7 %
Neutrophil #: 3.2 x10 3/mm (ref 1.4–6.5)
Neutrophil %: 64.7 %
PLATELETS: 95 x10 3/mm — AB (ref 150–440)
RBC: 4.24 10*6/uL (ref 3.80–5.20)
RDW: 15.2 % — AB (ref 11.5–14.5)
WBC: 4.9 x10 3/mm (ref 3.6–11.0)

## 2013-11-08 LAB — PROT IMMUNOELECTROPHORES(ARMC)

## 2013-11-09 ENCOUNTER — Ambulatory Visit: Payer: Self-pay | Admitting: Family Medicine

## 2013-11-20 ENCOUNTER — Encounter: Payer: Self-pay | Admitting: Internal Medicine

## 2013-11-20 ENCOUNTER — Ambulatory Visit (INDEPENDENT_AMBULATORY_CARE_PROVIDER_SITE_OTHER): Payer: Medicare PPO | Admitting: Internal Medicine

## 2013-11-20 VITALS — BP 122/70 | HR 70 | Ht 59.5 in | Wt 244.0 lb

## 2013-11-20 DIAGNOSIS — I429 Cardiomyopathy, unspecified: Secondary | ICD-10-CM

## 2013-11-20 DIAGNOSIS — I5022 Chronic systolic (congestive) heart failure: Secondary | ICD-10-CM

## 2013-11-20 DIAGNOSIS — F4321 Adjustment disorder with depressed mood: Secondary | ICD-10-CM | POA: Insufficient documentation

## 2013-11-20 LAB — MDC_IDC_ENUM_SESS_TYPE_INCLINIC
Battery Remaining Longevity: 130 mo
Date Time Interrogation Session: 20150818114910
HIGH POWER IMPEDANCE MEASURED VALUE: 68 Ohm
HighPow Impedance: 171 Ohm
Lead Channel Impedance Value: 456 Ohm
Lead Channel Pacing Threshold Amplitude: 0.5 V
Lead Channel Pacing Threshold Pulse Width: 0.4 ms
Lead Channel Sensing Intrinsic Amplitude: 12.75 mV
Lead Channel Sensing Intrinsic Amplitude: 13.125 mV
Lead Channel Setting Pacing Amplitude: 2 V
MDC IDC MSMT BATTERY VOLTAGE: 3.04 V
MDC IDC SET LEADCHNL RV PACING PULSEWIDTH: 0.4 ms
MDC IDC SET LEADCHNL RV SENSING SENSITIVITY: 0.3 mV
MDC IDC SET ZONE DETECTION INTERVAL: 350 ms
MDC IDC STAT BRADY RV PERCENT PACED: 0.01 %
Zone Setting Detection Interval: 290 ms
Zone Setting Detection Interval: 450 ms

## 2013-11-20 NOTE — Patient Instructions (Signed)
Your physician wants you to follow-up in: 6 months with Dr. Klein. You will receive a reminder letter in the mail two months in advance. If you don't receive a letter, please call our office to schedule the follow-up appointment.  

## 2013-11-20 NOTE — Progress Notes (Signed)
Patient Care Team: Dion Body as PCP - General (Family Medicine)   HPI  Katherine Rosales is a 63 y.o. female Seen in followup for ICD implanted for nonischemic cardiac myopathy and primary prevention. She has chronic systolic failure. She underwent generator replacement in 8/14. She is a Medtronic device.  The patient denies chest pain, shortness of breath is stable and chronic  NO nocturnal dyspnea, orthopnea or peripheral edema.  There have been no palpitations, lightheadedness or syncope.   Her father has passed away intercurrently.  Past Medical History  Diagnosis Date  . Systolic heart failure, chronic   . Cardiomyopathy, nonischemic   . ICD (implantable cardiac defibrillator), singleMedtronic   . Diabetes mellitus     Type II Diabetes  . 9562 Lead-Medtronic   . Automatic implantable cardioverter-defibrillator in situ   . Spleen enlarged     Past Surgical History  Procedure Laterality Date  . Breast lumpectomy    . Tonsillectomy    . Tubal ligation    . Pacemaker insertion  06/30/2005    Medtronic Virtuoso VR ICD - Nonischemic cardiomyopathy potentially related to adriamycin, class 3 congestive failture with a narrow QRS and a negative TDI  . Cholecystectomy    . Joint replacement  2009    Total Knee Replacement, Left    Current Outpatient Prescriptions  Medication Sig Dispense Refill  . aspirin 81 MG tablet Take 81 mg by mouth daily.      . Calcium Carbonate-Vitamin D (CALTRATE 600+D) 600-400 MG-UNIT per tablet Take 1 tablet by mouth 2 (two) times daily.        . carvedilol (COREG) 12.5 MG tablet Take 1.5 tablets (18.75 mg total) by mouth 2 (two) times daily with a meal.  90 tablet  4  . docusate sodium (COLACE) 100 MG capsule Take 100 mg by mouth 2 (two) times daily.        . furosemide (LASIX) 20 MG tablet Take 20 mg by mouth See admin instructions. Takes three tablets weekly. Sundays wednesdays and Friday      . gabapentin (NEURONTIN) 600 MG tablet Take 600  mg by mouth 2 (two) times daily.      Marland Kitchen glimepiride (AMARYL) 2 MG tablet Take 2 mg by mouth daily before breakfast.      . levothyroxine (SYNTHROID, LEVOTHROID) 100 MCG tablet Take 100 mcg by mouth daily.        Marland Kitchen lisinopril (PRINIVIL,ZESTRIL) 20 MG tablet Take 20 mg by mouth daily.        Marland Kitchen loratadine (CLARITIN) 10 MG tablet Take 10 mg by mouth daily.        . meloxicam (MOBIC) 15 MG tablet Take 15 mg by mouth daily.      . Multiple Vitamins-Minerals (CENTRUM SILVER) tablet Take 1 tablet by mouth daily.        . Multiple Vitamins-Minerals (OCUVITE PO) Take by mouth daily.      Marland Kitchen omeprazole (PRILOSEC) 20 MG capsule Take 20 mg by mouth daily.        . sertraline (ZOLOFT) 100 MG tablet Take 100 mg by mouth 2 (two) times daily.        No current facility-administered medications for this visit.    No Known Allergies  Review of Systems negative except from HPI and PMH  Physical Exam Ht 4' 11.5" (1.511 m)  Wt 244 lb (110.678 kg)  BMI 48.48 kg/m2 Well developed and nourished in no acute distress HENT normal Neck supple with JVP-flat Clear Regular  rate and rhythm, no murmurs or gallops Abd-soft with active BS No Clubbing cyanosis edema Skin-warm and dry A & Oriented  Grossly normal sensory and motor function Device pocket well healed; without hematoma or erythema without superficial anastomoses  ECG demonstrates sinus rhythm at 69 Intervals 17/09/44 Nonspecific ST-T changes  Assessment and  Plan  Nonischemic cardiomyopathy  Chronic systolic heart failure  Implantable defibrillator-Medtronic  The patient's device was interrogated.  The information was reviewed. No changes were made in the programming.    Grieving  The patient is stable from cardiac point of view. We've been more than 20 minutes discussing her father's passing away, ad the stresses on going with the caregiver mother.

## 2013-11-27 LAB — CBC CANCER CENTER
BASOS ABS: 0 x10 3/mm (ref 0.0–0.1)
BASOS PCT: 0.7 %
EOS ABS: 0.2 x10 3/mm (ref 0.0–0.7)
EOS PCT: 3.5 %
HCT: 34 % — AB (ref 35.0–47.0)
HGB: 10.9 g/dL — ABNORMAL LOW (ref 12.0–16.0)
LYMPHS PCT: 19.6 %
Lymphocyte #: 1 x10 3/mm (ref 1.0–3.6)
MCH: 28.6 pg (ref 26.0–34.0)
MCHC: 32.1 g/dL (ref 32.0–36.0)
MCV: 89 fL (ref 80–100)
MONO ABS: 0.5 x10 3/mm (ref 0.2–0.9)
MONOS PCT: 9.1 %
Neutrophil #: 3.4 x10 3/mm (ref 1.4–6.5)
Neutrophil %: 67.1 %
Platelet: 89 x10 3/mm — ABNORMAL LOW (ref 150–440)
RBC: 3.82 10*6/uL (ref 3.80–5.20)
RDW: 15.2 % — ABNORMAL HIGH (ref 11.5–14.5)
WBC: 5.1 x10 3/mm (ref 3.6–11.0)

## 2013-12-04 ENCOUNTER — Ambulatory Visit: Payer: Self-pay | Admitting: Oncology

## 2013-12-25 DIAGNOSIS — M1710 Unilateral primary osteoarthritis, unspecified knee: Secondary | ICD-10-CM | POA: Insufficient documentation

## 2013-12-25 DIAGNOSIS — F419 Anxiety disorder, unspecified: Secondary | ICD-10-CM

## 2013-12-25 HISTORY — DX: Anxiety disorder, unspecified: F41.9

## 2014-02-21 ENCOUNTER — Ambulatory Visit (INDEPENDENT_AMBULATORY_CARE_PROVIDER_SITE_OTHER): Payer: Commercial Managed Care - HMO | Admitting: *Deleted

## 2014-02-21 ENCOUNTER — Telehealth: Payer: Self-pay | Admitting: Cardiology

## 2014-02-21 DIAGNOSIS — I429 Cardiomyopathy, unspecified: Secondary | ICD-10-CM

## 2014-02-21 DIAGNOSIS — I5022 Chronic systolic (congestive) heart failure: Secondary | ICD-10-CM

## 2014-02-21 NOTE — Telephone Encounter (Signed)
LMOVM reminding pt to send remote transmission.   

## 2014-02-22 DIAGNOSIS — I5022 Chronic systolic (congestive) heart failure: Secondary | ICD-10-CM

## 2014-02-22 DIAGNOSIS — I429 Cardiomyopathy, unspecified: Secondary | ICD-10-CM

## 2014-02-22 LAB — MDC_IDC_ENUM_SESS_TYPE_REMOTE
Date Time Interrogation Session: 20151120050540
HIGH POWER IMPEDANCE MEASURED VALUE: 82 Ohm
Lead Channel Impedance Value: 456 Ohm
Lead Channel Pacing Threshold Amplitude: 0.625 V
Lead Channel Pacing Threshold Pulse Width: 0.4 ms
Lead Channel Sensing Intrinsic Amplitude: 14 mV
Lead Channel Setting Pacing Amplitude: 2 V
MDC IDC MSMT BATTERY REMAINING LONGEVITY: 128 mo
MDC IDC MSMT BATTERY VOLTAGE: 3.03 V
MDC IDC MSMT LEADCHNL RV IMPEDANCE VALUE: 380 Ohm
MDC IDC MSMT LEADCHNL RV SENSING INTR AMPL: 14 mV
MDC IDC SET LEADCHNL RV PACING PULSEWIDTH: 0.4 ms
MDC IDC SET LEADCHNL RV SENSING SENSITIVITY: 0.3 mV
MDC IDC SET ZONE DETECTION INTERVAL: 290 ms
MDC IDC SET ZONE DETECTION INTERVAL: 350 ms
MDC IDC STAT BRADY RV PERCENT PACED: 0.01 %
Zone Setting Detection Interval: 450 ms

## 2014-02-22 NOTE — Progress Notes (Signed)
Remote ICD transmission.   

## 2014-03-04 ENCOUNTER — Ambulatory Visit: Payer: Self-pay | Admitting: Oncology

## 2014-03-04 LAB — CBC CANCER CENTER
Basophil #: 0 x10 3/mm (ref 0.0–0.1)
Basophil %: 0.8 %
EOS PCT: 3.5 %
Eosinophil #: 0.2 x10 3/mm (ref 0.0–0.7)
HCT: 37.6 % (ref 35.0–47.0)
HGB: 12.2 g/dL (ref 12.0–16.0)
LYMPHS ABS: 1 x10 3/mm (ref 1.0–3.6)
Lymphocyte %: 21.4 %
MCH: 28.4 pg (ref 26.0–34.0)
MCHC: 32.5 g/dL (ref 32.0–36.0)
MCV: 87 fL (ref 80–100)
MONOS PCT: 10.2 %
Monocyte #: 0.5 x10 3/mm (ref 0.2–0.9)
NEUTROS ABS: 3 x10 3/mm (ref 1.4–6.5)
Neutrophil %: 64.1 %
PLATELETS: 93 x10 3/mm — AB (ref 150–440)
RBC: 4.3 10*6/uL (ref 3.80–5.20)
RDW: 14.9 % — AB (ref 11.5–14.5)
WBC: 4.6 x10 3/mm (ref 3.6–11.0)

## 2014-03-05 ENCOUNTER — Ambulatory Visit: Payer: Self-pay | Admitting: Oncology

## 2014-03-11 ENCOUNTER — Encounter: Payer: Self-pay | Admitting: Cardiology

## 2014-03-14 ENCOUNTER — Encounter (HOSPITAL_COMMUNITY): Payer: Self-pay | Admitting: Internal Medicine

## 2014-03-18 ENCOUNTER — Encounter: Payer: Self-pay | Admitting: Internal Medicine

## 2014-04-04 ENCOUNTER — Other Ambulatory Visit: Payer: Self-pay | Admitting: Internal Medicine

## 2014-04-04 MED ORDER — CARVEDILOL 12.5 MG PO TABS: 18.7500 mg | ORAL_TABLET | Freq: Two times a day (BID) | ORAL | Status: DC

## 2014-05-03 ENCOUNTER — Other Ambulatory Visit: Payer: Self-pay | Admitting: *Deleted

## 2014-05-07 ENCOUNTER — Encounter: Payer: Self-pay | Admitting: Internal Medicine

## 2014-05-07 ENCOUNTER — Ambulatory Visit (INDEPENDENT_AMBULATORY_CARE_PROVIDER_SITE_OTHER): Payer: Commercial Managed Care - HMO | Admitting: Internal Medicine

## 2014-05-07 VITALS — BP 131/83 | HR 75 | Ht 59.0 in | Wt 239.8 lb

## 2014-05-07 DIAGNOSIS — I5022 Chronic systolic (congestive) heart failure: Secondary | ICD-10-CM

## 2014-05-07 LAB — MDC_IDC_ENUM_SESS_TYPE_INCLINIC
Battery Remaining Longevity: 127 mo
Battery Voltage: 3.03 V
Brady Statistic RV Percent Paced: 0.01 %
HIGH POWER IMPEDANCE MEASURED VALUE: 171 Ohm
HIGH POWER IMPEDANCE MEASURED VALUE: 76 Ohm
Lead Channel Impedance Value: 437 Ohm
Lead Channel Pacing Threshold Amplitude: 0.75 V
Lead Channel Setting Pacing Amplitude: 2 V
Lead Channel Setting Sensing Sensitivity: 0.3 mV
MDC IDC MSMT LEADCHNL RV PACING THRESHOLD PULSEWIDTH: 0.4 ms
MDC IDC MSMT LEADCHNL RV SENSING INTR AMPL: 14.375 mV
MDC IDC MSMT LEADCHNL RV SENSING INTR AMPL: 14.5 mV
MDC IDC SESS DTM: 20160202123919
MDC IDC SET LEADCHNL RV PACING PULSEWIDTH: 0.4 ms
MDC IDC SET ZONE DETECTION INTERVAL: 290 ms
Zone Setting Detection Interval: 350 ms
Zone Setting Detection Interval: 450 ms

## 2014-05-07 NOTE — Progress Notes (Signed)
Patient Care Team: Dion Body as PCP - General (Family Medicine)   HPI  Katherine Rosales is a 64 y.o. female Seen in followup for ICD implanted for nonischemic cardiac myopathy and primary prevention. She has chronic systolic failure. She underwent generator replacement in 8/14. She is a Medtronic device.  The patient denies chest pain, shortness of breath is stable and chronic  NO nocturnal dyspnea, orthopnea or peripheral edema.  There have been no palpitations, lightheadedness or syncope.   Her father has passed away intercurrently.   She is carrying for her mom.  She is extremely fatigued  She has a history of sleep apnea. He was treated with BiPAP years ago. She has been noncompliant. Past Medical History  Diagnosis Date  . Systolic heart failure, chronic   . Cardiomyopathy, nonischemic   . ICD (implantable cardiac defibrillator), singleMedtronic   . Diabetes mellitus     Type II Diabetes  . 4627 Lead-Medtronic   . Automatic implantable cardioverter-defibrillator in situ   . Spleen enlarged     Past Surgical History  Procedure Laterality Date  . Breast lumpectomy    . Tonsillectomy    . Tubal ligation    . Pacemaker insertion  06/30/2005    Medtronic Virtuoso VR ICD - Nonischemic cardiomyopathy potentially related to adriamycin, class 3 congestive failture with a narrow QRS and a negative TDI  . Cholecystectomy    . Joint replacement  2009    Total Knee Replacement, Left  . Implantable cardioverter defibrillator (icd) generator change N/A 11/15/2012    Procedure: ICD GENERATOR CHANGE;  Surgeon: Deboraha Sprang, MD;  Location: [facility] CATH LAB;  Service: Cardiovascular;  Laterality: N/A;  . Lead revision N/A 11/15/2012    Procedure: LEAD REVISION;  Surgeon: Deboraha Sprang, MD;  Location: [facility] CATH LAB;  Service: Cardiovascular;  Laterality: N/A;    Current Outpatient Prescriptions  Medication Sig Dispense Refill  . aspirin 81 MG tablet Take 81 mg by mouth daily.    .  Calcium Carbonate-Vitamin D (CALTRATE 600+D) 600-400 MG-UNIT per tablet Take 1 tablet by mouth 2 (two) times daily.      . carvedilol (COREG) 12.5 MG tablet Take 1.5 tablets (18.75 mg total) by mouth 2 (two) times daily with a meal. 90 tablet 4  . docusate sodium (COLACE) 100 MG capsule Take 100 mg by mouth 2 (two) times daily.      . furosemide (LASIX) 20 MG tablet Take 20 mg by mouth See admin instructions. Takes three tablets weekly. Sundays wednesdays and Friday    . gabapentin (NEURONTIN) 600 MG tablet Take 600 mg by mouth 2 (two) times daily.    Marland Kitchen glimepiride (AMARYL) 2 MG tablet Take 2 mg by mouth daily before breakfast.    . levothyroxine (SYNTHROID, LEVOTHROID) 100 MCG tablet Take 100 mcg by mouth daily.      Marland Kitchen lisinopril (PRINIVIL,ZESTRIL) 20 MG tablet Take 20 mg by mouth daily.      Marland Kitchen loratadine (CLARITIN) 10 MG tablet Take 10 mg by mouth daily.      . Multiple Vitamins-Minerals (CENTRUM SILVER) tablet Take 1 tablet by mouth daily.      . Multiple Vitamins-Minerals (OCUVITE PO) Take by mouth daily.    Marland Kitchen omeprazole (PRILOSEC) 20 MG capsule Take 20 mg by mouth daily.      . sertraline (ZOLOFT) 100 MG tablet Take 100 mg by mouth 2 (two) times daily.      No current facility-administered medications for this visit.  No Known Allergies  Review of Systems negative except from HPI and PMH  Physical Exam BP 131/83 mmHg  Pulse 75  Ht 4' 11"  (1.499 m)  Wt 239 lb 12 oz (108.75 kg)  BMI 48.40 kg/m2 Well developed and nourished in no acute distress HENT normal Neck supple with JVP-flat Clear Regular rate and rhythm, no murmurs or gallops Abd-soft with active BS No Clubbing cyanosis edema Skin-warm and dry A & Oriented  Grossly normal sensory and motor function Device pocket well healed; without hematoma or erythema without superficial anastomoses  ECG demonstrates sinus rhythm at 79 Intervals 19/11/43 Nonspecific ST-T changes Poor R wave progression  Assessment and   Plan  Nonischemic cardiomyopathy  Chronic systolic heart failure  Hypertension     Implantable defibrillator-Medtronic  The patient's device was interrogated.  The information was reviewed. No changes were made in the programming.    Sleep apnea  -- non compliant   Euvolemic continue current meds  Blood pressure reasonably controlled  We have had a long conversation regarding need and benefits for compliance with OSA and Rx

## 2014-05-07 NOTE — Patient Instructions (Signed)
Your physician wants you to follow-up in: 1 year with Dr. [name]. You will receive a reminder letter in the mail two months in advance. If you don't receive a letter, please call our office to schedule the follow-up appointment.  Remote monitoring is used to monitor your Pacemaker of ICD from home. This monitoring reduces the number of office visits required to check your device to one time per year. It allows [place] to keep an eye on the functioning of your device to ensure it is working properly. You are scheduled for a device check from home on 08/06/14. You may send your transmission at any time that day. If you have a wireless device, the transmission will be sent automatically. After your physician reviews your transmission, you will receive a postcard with your next transmission date.

## 2014-06-03 ENCOUNTER — Ambulatory Visit: Payer: Self-pay | Admitting: Oncology

## 2014-06-03 DIAGNOSIS — D696 Thrombocytopenia, unspecified: Secondary | ICD-10-CM | POA: Diagnosis not present

## 2014-06-04 ENCOUNTER — Ambulatory Visit
Admit: 2014-06-04 | Disposition: A | Discharge status: Home / Self Care | Payer: Self-pay | Attending: Oncology | Admitting: Oncology

## 2014-07-11 ENCOUNTER — Ambulatory Visit
Admit: 2014-07-11 | Disposition: A | Discharge status: Home / Self Care | Payer: Self-pay | Attending: Family Medicine | Admitting: Family Medicine

## 2014-07-11 DIAGNOSIS — N6489 Other specified disorders of breast: Secondary | ICD-10-CM | POA: Diagnosis not present

## 2014-07-11 DIAGNOSIS — N649 Disorder of breast, unspecified: Secondary | ICD-10-CM | POA: Diagnosis not present

## 2014-07-19 ENCOUNTER — Other Ambulatory Visit: Payer: Self-pay | Admitting: Family Medicine

## 2014-07-19 DIAGNOSIS — Z853 Personal history of malignant neoplasm of breast: Secondary | ICD-10-CM

## 2014-08-06 ENCOUNTER — Encounter: Payer: Self-pay | Admitting: Internal Medicine

## 2014-08-06 ENCOUNTER — Ambulatory Visit (INDEPENDENT_AMBULATORY_CARE_PROVIDER_SITE_OTHER): Payer: Commercial Managed Care - HMO | Admitting: *Deleted

## 2014-08-06 ENCOUNTER — Other Ambulatory Visit: Payer: Self-pay | Admitting: Internal Medicine

## 2014-08-06 DIAGNOSIS — I429 Cardiomyopathy, unspecified: Secondary | ICD-10-CM | POA: Diagnosis not present

## 2014-08-06 DIAGNOSIS — I5022 Chronic systolic (congestive) heart failure: Secondary | ICD-10-CM

## 2014-08-06 LAB — CUP PACEART REMOTE DEVICE CHECK
Battery Remaining Longevity: 125 mo
Brady Statistic RV Percent Paced: 2.82 %
HighPow Impedance: 65 Ohm
Lead Channel Impedance Value: 437 Ohm
Lead Channel Pacing Threshold Amplitude: 0.625 V
Lead Channel Sensing Intrinsic Amplitude: 11.375 mV
Lead Channel Setting Pacing Amplitude: 2 V
Lead Channel Setting Sensing Sensitivity: 0.3 mV
MDC IDC MSMT BATTERY VOLTAGE: 3.02 V
MDC IDC MSMT LEADCHNL RV IMPEDANCE VALUE: 342 Ohm
MDC IDC MSMT LEADCHNL RV PACING THRESHOLD PULSEWIDTH: 0.4 ms
MDC IDC MSMT LEADCHNL RV SENSING INTR AMPL: 11.375 mV
MDC IDC SESS DTM: 20160503131623
MDC IDC SET LEADCHNL RV PACING PULSEWIDTH: 0.4 ms
MDC IDC SET ZONE DETECTION INTERVAL: 290 ms
Zone Setting Detection Interval: 350 ms
Zone Setting Detection Interval: 450 ms

## 2014-08-06 NOTE — Progress Notes (Signed)
Remote ICD transmission.   

## 2014-08-08 ENCOUNTER — Telehealth: Payer: Self-pay

## 2014-08-08 NOTE — Telephone Encounter (Signed)
S/w pt who asked how much coreg she should be taking daily. Reviewed coreg medication order. Pt. Indicates pharmacy did not give her enough in her refill. Recommended to pt. To notify pharmacy. Pt. Verbalized understanding and had no further questions.

## 2014-08-08 NOTE — Telephone Encounter (Signed)
Pt has a question regarding her carvedilol

## 2014-08-26 ENCOUNTER — Encounter: Payer: Self-pay | Admitting: Cardiology

## 2014-08-27 DIAGNOSIS — E6609 Other obesity due to excess calories: Secondary | ICD-10-CM | POA: Diagnosis not present

## 2014-08-27 DIAGNOSIS — G4733 Obstructive sleep apnea (adult) (pediatric): Secondary | ICD-10-CM | POA: Diagnosis not present

## 2014-08-27 DIAGNOSIS — F329 Major depressive disorder, single episode, unspecified: Secondary | ICD-10-CM | POA: Diagnosis not present

## 2014-08-27 DIAGNOSIS — J449 Chronic obstructive pulmonary disease, unspecified: Secondary | ICD-10-CM | POA: Diagnosis not present

## 2014-08-30 DIAGNOSIS — R600 Localized edema: Secondary | ICD-10-CM | POA: Diagnosis not present

## 2014-09-06 ENCOUNTER — Other Ambulatory Visit: Payer: Self-pay

## 2014-09-06 MED ORDER — CARVEDILOL 12.5 MG PO TABS: 18.7500 mg | ORAL_TABLET | Freq: Two times a day (BID) | ORAL | Status: DC

## 2014-09-11 ENCOUNTER — Inpatient Hospital Stay: Payer: Commercial Managed Care - HMO

## 2014-09-11 ENCOUNTER — Inpatient Hospital Stay: Payer: Commercial Managed Care - HMO | Attending: Oncology | Admitting: Oncology

## 2014-09-11 ENCOUNTER — Other Ambulatory Visit: Payer: Self-pay | Admitting: *Deleted

## 2014-09-11 ENCOUNTER — Encounter (INDEPENDENT_AMBULATORY_CARE_PROVIDER_SITE_OTHER): Payer: Self-pay

## 2014-09-11 VITALS — BP 124/76 | HR 71 | Temp 96.6°F | Resp 20 | Wt 239.4 lb

## 2014-09-11 DIAGNOSIS — D696 Thrombocytopenia, unspecified: Secondary | ICD-10-CM

## 2014-09-11 DIAGNOSIS — Z7982 Long term (current) use of aspirin: Secondary | ICD-10-CM | POA: Insufficient documentation

## 2014-09-11 DIAGNOSIS — I429 Cardiomyopathy, unspecified: Secondary | ICD-10-CM | POA: Insufficient documentation

## 2014-09-11 DIAGNOSIS — Z95810 Presence of automatic (implantable) cardiac defibrillator: Secondary | ICD-10-CM | POA: Insufficient documentation

## 2014-09-11 DIAGNOSIS — E119 Type 2 diabetes mellitus without complications: Secondary | ICD-10-CM | POA: Diagnosis not present

## 2014-09-11 DIAGNOSIS — Z79899 Other long term (current) drug therapy: Secondary | ICD-10-CM

## 2014-09-11 DIAGNOSIS — Z9049 Acquired absence of other specified parts of digestive tract: Secondary | ICD-10-CM | POA: Diagnosis not present

## 2014-09-11 DIAGNOSIS — D693 Immune thrombocytopenic purpura: Secondary | ICD-10-CM | POA: Insufficient documentation

## 2014-09-11 DIAGNOSIS — R161 Splenomegaly, not elsewhere classified: Secondary | ICD-10-CM

## 2014-09-11 DIAGNOSIS — I5022 Chronic systolic (congestive) heart failure: Secondary | ICD-10-CM | POA: Diagnosis not present

## 2014-09-11 LAB — CBC WITH DIFFERENTIAL/PLATELET
BASOS ABS: 0 10*3/uL (ref 0–0.1)
BASOS PCT: 1 %
EOS PCT: 3 %
Eosinophils Absolute: 0.2 10*3/uL (ref 0–0.7)
HEMATOCRIT: 35.3 % (ref 35.0–47.0)
HEMOGLOBIN: 11.1 g/dL — AB (ref 12.0–16.0)
LYMPHS ABS: 0.9 10*3/uL — AB (ref 1.0–3.6)
LYMPHS PCT: 17 %
MCH: 27 pg (ref 26.0–34.0)
MCHC: 31.4 g/dL — ABNORMAL LOW (ref 32.0–36.0)
MCV: 85.9 fL (ref 80.0–100.0)
Monocytes Absolute: 0.4 10*3/uL (ref 0.2–0.9)
Monocytes Relative: 8 %
Neutro Abs: 3.8 10*3/uL (ref 1.4–6.5)
Neutrophils Relative %: 71 %
PLATELETS: 96 10*3/uL — AB (ref 150–440)
RBC: 4.11 MIL/uL (ref 3.80–5.20)
RDW: 16.1 % — AB (ref 11.5–14.5)
WBC: 5.3 10*3/uL (ref 3.6–11.0)

## 2014-09-18 ENCOUNTER — Ambulatory Visit: Payer: Commercial Managed Care - HMO | Attending: Specialist

## 2014-09-18 DIAGNOSIS — G4733 Obstructive sleep apnea (adult) (pediatric): Secondary | ICD-10-CM | POA: Insufficient documentation

## 2014-09-18 DIAGNOSIS — E669 Obesity, unspecified: Secondary | ICD-10-CM | POA: Insufficient documentation

## 2014-09-18 DIAGNOSIS — R400 Somnolence: Secondary | ICD-10-CM | POA: Diagnosis present

## 2014-09-18 DIAGNOSIS — G4761 Periodic limb movement disorder: Secondary | ICD-10-CM | POA: Diagnosis not present

## 2014-09-18 DIAGNOSIS — R0683 Snoring: Secondary | ICD-10-CM | POA: Diagnosis present

## 2014-10-03 ENCOUNTER — Ambulatory Visit: Payer: Commercial Managed Care - HMO | Attending: Specialist

## 2014-10-03 DIAGNOSIS — G4761 Periodic limb movement disorder: Secondary | ICD-10-CM | POA: Diagnosis not present

## 2014-10-03 DIAGNOSIS — G4733 Obstructive sleep apnea (adult) (pediatric): Secondary | ICD-10-CM | POA: Diagnosis not present

## 2014-10-06 NOTE — Progress Notes (Signed)
[place]  [phone] [phone]) [phone]  ID: Katherine Rosales OB: 12-Apr-1950  MR#: [id number]  KYH#:[id number]  Patient Care Team: Katherine Body, MD as PCP - General (Family Medicine)  CHIEF COMPLAINT:  Chief Complaint  Patient presents with  . Follow-up    thrombocytopenia    INTERVAL HISTORY: Patient returns to clinic today for repeat laboratory work and further evaluation. She continues to feel well and is asymptomatic. She has no neurologic complaints.  She has no recent fevers or illnesses. She has a good appetite and denies weight loss. She denies any chest pain or shortness of breath. She denies any easy bleeding or bruising. She has no nausea, vomiting, constipation, or diarrhea. She has no urinary complaints. Patient offers no specific complaints today.   REVIEW OF SYSTEMS:   Review of Systems  Constitutional: Negative.   Endo/Heme/Allergies: Does not bruise/bleed easily.    As per HPI. Otherwise, a complete review of systems is negatve.  PAST MEDICAL HISTORY: Past Medical History  Diagnosis Date  . Systolic heart failure, chronic   . Cardiomyopathy, nonischemic   . ICD (implantable cardiac defibrillator), singleMedtronic   . Diabetes mellitus     Type II Diabetes  . 1517 Lead-Medtronic   . Automatic implantable cardioverter-defibrillator in situ   . Spleen enlarged     PAST SURGICAL HISTORY: Past Surgical History  Procedure Laterality Date  . Breast lumpectomy    . Tonsillectomy    . Tubal ligation    . Pacemaker insertion  06/30/2005    Medtronic Virtuoso VR ICD - Nonischemic cardiomyopathy potentially related to adriamycin, class 3 congestive failture with a narrow QRS and a negative TDI  . Cholecystectomy    . Joint replacement  2009    Total Knee Replacement, Left  . Implantable cardioverter defibrillator (icd) generator change N/A 11/15/2012    Procedure: ICD GENERATOR CHANGE;  Surgeon: Katherine Sprang, MD;   Location: [facility] CATH LAB;  Service: Cardiovascular;  Laterality: N/A;  . Lead revision N/A 11/15/2012    Procedure: LEAD REVISION;  Surgeon: Katherine Sprang, MD;  Location: [facility] CATH LAB;  Service: Cardiovascular;  Laterality: N/A;    FAMILY HISTORY Family History  Problem Relation Age of Onset  . Family history unknown: Yes       ADVANCED DIRECTIVES:    HEALTH MAINTENANCE: History  Substance Use Topics  . Smoking status: Never Smoker   . Smokeless tobacco: Not on file  . Alcohol Use: No     Colonoscopy:  PAP:  Bone density:  Lipid panel:  No Known Allergies  Current Outpatient Prescriptions  Medication Sig Dispense Refill  . aspirin 81 MG tablet Take 81 mg by mouth daily.    . Calcium Carbonate-Vitamin D (CALTRATE 600+D) 600-400 MG-UNIT per tablet Take 1 tablet by mouth 2 (two) times daily.      . carvedilol (COREG) 12.5 MG tablet Take 1.5 tablets (18.75 mg total) by mouth 2 (two) times daily with a meal. 90 tablet 10  . docusate sodium (COLACE) 100 MG capsule Take 100 mg by mouth 2 (two) times daily.      . furosemide (LASIX) 20 MG tablet Take 20 mg by mouth See admin instructions. Takes three tablets weekly. Sundays wednesdays and Friday    . gabapentin (NEURONTIN) 600 MG tablet Take 600 mg by mouth 2 (two) times daily.    Marland Kitchen glimepiride (AMARYL) 2 MG tablet Take 2 mg by mouth daily before breakfast.    . levothyroxine (SYNTHROID,  LEVOTHROID) 112 MCG tablet     . lisinopril (PRINIVIL,ZESTRIL) 20 MG tablet Take 20 mg by mouth daily.      Marland Kitchen loratadine (CLARITIN) 10 MG tablet Take 10 mg by mouth daily.      . Multiple Vitamins-Minerals (CENTRUM SILVER) tablet Take 1 tablet by mouth daily.      . Multiple Vitamins-Minerals (OCUVITE PO) Take by mouth daily.    Marland Kitchen omeprazole (PRILOSEC) 20 MG capsule Take 20 mg by mouth daily.      . sertraline (ZOLOFT) 100 MG tablet Take 100 mg by mouth 2 (two) times daily.     . SYMBICORT 160-4.5 MCG/ACT inhaler      No current  facility-administered medications for this visit.    OBJECTIVE: Filed Vitals:   09/11/14 1554  BP: 124/76  Pulse: 71  Temp: 96.6 F (35.9 C)  Resp: 20     Rosales mass index is 48.33 kg/(m^2).    ECOG FS:0 - Asymptomatic  General: Well-developed, well-nourished, no acute distress. Eyes: anicteric sclera. Lungs: Clear to auscultation bilaterally. Heart: Regular rate and rhythm. No rubs, murmurs, or gallops. Abdomen: Soft, nontender, nondistended. No organomegaly noted, normoactive bowel sounds. Musculoskeletal: No edema, cyanosis, or clubbing. Neuro: Alert, answering all questions appropriately. Cranial nerves grossly intact. Skin: No rashes or petechiae noted. Psych: Normal affect.    LAB RESULTS:  Lab Results  Component Value Date   NA 141 11/08/2012   K 4.7 11/08/2012   CL 102 11/08/2012   CO2 21 11/08/2012   GLUCOSE 121* 11/08/2012   BUN 21 11/08/2012   CREATININE 1.10* 11/08/2012   CALCIUM 9.7 11/08/2012   GFRNONAA 54* 11/08/2012   GFRAA 62 11/08/2012    Lab Results  Component Value Date   WBC 5.3 09/11/2014   NEUTROABS 3.8 09/11/2014   HGB 11.1* 09/11/2014   HCT 35.3 09/11/2014   MCV 85.9 09/11/2014   PLT 96* 09/11/2014     STUDIES: No results found.  ASSESSMENT: Autoimmune thrombocytopenia.  PLAN:    1. Thrombocytopenia: Patient's platelet antibodies are positive. She also has a positive ANA, but this is likely clinically insignificant. Finally, she also has mild splenomegaly which is also likely contributing.  Her platelets are relatively stable, therefore no intervention is needed at this time. She does not require treatment at this time. The remainder of her laboratory work was either negative or within normal limits. Can consider repeating platelet antibodies, because these occasionally can be transient in nature.  Return to clinic in 3 months with repeat laboratory work and then in 6 months with repeat laboratory work and further evaluation.    Patient expressed understanding and was in agreement with this plan. She also understands that She can call clinic at any time with any questions, concerns, or complaints.    Katherine Huger, MD   10/06/2014 10:48 AM

## 2014-10-11 ENCOUNTER — Encounter: Payer: Self-pay | Admitting: Internal Medicine

## 2014-11-05 ENCOUNTER — Ambulatory Visit (INDEPENDENT_AMBULATORY_CARE_PROVIDER_SITE_OTHER): Payer: Commercial Managed Care - HMO | Admitting: *Deleted

## 2014-11-05 DIAGNOSIS — I5022 Chronic systolic (congestive) heart failure: Secondary | ICD-10-CM

## 2014-11-05 DIAGNOSIS — I429 Cardiomyopathy, unspecified: Secondary | ICD-10-CM

## 2014-11-06 NOTE — Progress Notes (Signed)
Remote ICD transmission.   

## 2014-11-07 DIAGNOSIS — G4733 Obstructive sleep apnea (adult) (pediatric): Secondary | ICD-10-CM | POA: Diagnosis not present

## 2014-11-07 DIAGNOSIS — R0602 Shortness of breath: Secondary | ICD-10-CM | POA: Diagnosis not present

## 2014-11-07 DIAGNOSIS — E6609 Other obesity due to excess calories: Secondary | ICD-10-CM | POA: Diagnosis not present

## 2014-11-07 DIAGNOSIS — J449 Chronic obstructive pulmonary disease, unspecified: Secondary | ICD-10-CM | POA: Diagnosis not present

## 2014-11-11 ENCOUNTER — Ambulatory Visit
Admission: RE | Admit: 2014-11-11 | Discharge: 2014-11-11 | Disposition: A | Discharge status: Home / Self Care | Payer: Commercial Managed Care - HMO | Source: Ambulatory Visit | Attending: Family Medicine | Admitting: Family Medicine

## 2014-11-11 ENCOUNTER — Ambulatory Visit: Payer: Medicare PPO

## 2014-11-11 DIAGNOSIS — Z853 Personal history of malignant neoplasm of breast: Secondary | ICD-10-CM | POA: Insufficient documentation

## 2014-11-11 DIAGNOSIS — Z08 Encounter for follow-up examination after completed treatment for malignant neoplasm: Secondary | ICD-10-CM | POA: Diagnosis not present

## 2014-11-11 HISTORY — DX: Malignant neoplasm of unspecified site of unspecified female breast: C50.919

## 2014-11-16 LAB — CUP PACEART REMOTE DEVICE CHECK
Battery Remaining Longevity: 123 mo
Battery Voltage: 3.01 V
Brady Statistic RV Percent Paced: 0.12 %
Date Time Interrogation Session: 20160803070007
HIGH POWER IMPEDANCE MEASURED VALUE: 76 Ohm
Lead Channel Impedance Value: 342 Ohm
Lead Channel Impedance Value: 456 Ohm
Lead Channel Pacing Threshold Amplitude: 0.625 V
Lead Channel Pacing Threshold Pulse Width: 0.4 ms
Lead Channel Sensing Intrinsic Amplitude: 12.5 mV
Lead Channel Sensing Intrinsic Amplitude: 12.5 mV
Lead Channel Setting Pacing Pulse Width: 0.4 ms
Lead Channel Setting Sensing Sensitivity: 0.3 mV
MDC IDC SET LEADCHNL RV PACING AMPLITUDE: 2 V
MDC IDC SET ZONE DETECTION INTERVAL: 450 ms
Zone Setting Detection Interval: 290 ms
Zone Setting Detection Interval: 350 ms

## 2014-12-05 DIAGNOSIS — E039 Hypothyroidism, unspecified: Secondary | ICD-10-CM | POA: Diagnosis not present

## 2014-12-05 DIAGNOSIS — D696 Thrombocytopenia, unspecified: Secondary | ICD-10-CM | POA: Diagnosis not present

## 2014-12-05 DIAGNOSIS — E1143 Type 2 diabetes mellitus with diabetic autonomic (poly)neuropathy: Secondary | ICD-10-CM | POA: Diagnosis not present

## 2014-12-05 DIAGNOSIS — N182 Chronic kidney disease, stage 2 (mild): Secondary | ICD-10-CM | POA: Diagnosis not present

## 2014-12-08 DIAGNOSIS — G4733 Obstructive sleep apnea (adult) (pediatric): Secondary | ICD-10-CM | POA: Diagnosis not present

## 2014-12-12 ENCOUNTER — Inpatient Hospital Stay: Payer: Commercial Managed Care - HMO | Attending: Oncology

## 2014-12-12 DIAGNOSIS — Z853 Personal history of malignant neoplasm of breast: Secondary | ICD-10-CM | POA: Insufficient documentation

## 2014-12-12 DIAGNOSIS — D696 Thrombocytopenia, unspecified: Secondary | ICD-10-CM

## 2014-12-12 LAB — CBC WITH DIFFERENTIAL/PLATELET
BASOS PCT: 0 %
Basophils Absolute: 0 10*3/uL (ref 0–0.1)
EOS ABS: 0.1 10*3/uL (ref 0–0.7)
Eosinophils Relative: 3 %
HEMATOCRIT: 34.6 % — AB (ref 35.0–47.0)
HEMOGLOBIN: 11.5 g/dL — AB (ref 12.0–16.0)
Lymphocytes Relative: 18 %
Lymphs Abs: 0.8 10*3/uL — ABNORMAL LOW (ref 1.0–3.6)
MCH: 28.4 pg (ref 26.0–34.0)
MCHC: 33.3 g/dL (ref 32.0–36.0)
MCV: 85.3 fL (ref 80.0–100.0)
MONOS PCT: 9 %
Monocytes Absolute: 0.4 10*3/uL (ref 0.2–0.9)
NEUTROS ABS: 3.1 10*3/uL (ref 1.4–6.5)
NEUTROS PCT: 70 %
Platelets: 83 10*3/uL — ABNORMAL LOW (ref 150–440)
RBC: 4.05 MIL/uL (ref 3.80–5.20)
RDW: 16.3 % — ABNORMAL HIGH (ref 11.5–14.5)
WBC: 4.4 10*3/uL (ref 3.6–11.0)

## 2014-12-13 ENCOUNTER — Encounter: Payer: Self-pay | Admitting: *Deleted

## 2014-12-23 ENCOUNTER — Encounter: Payer: Self-pay | Admitting: Internal Medicine

## 2015-01-07 DIAGNOSIS — G4733 Obstructive sleep apnea (adult) (pediatric): Secondary | ICD-10-CM | POA: Diagnosis not present

## 2015-01-13 DIAGNOSIS — J449 Chronic obstructive pulmonary disease, unspecified: Secondary | ICD-10-CM | POA: Diagnosis not present

## 2015-01-13 DIAGNOSIS — E6609 Other obesity due to excess calories: Secondary | ICD-10-CM | POA: Diagnosis not present

## 2015-01-13 DIAGNOSIS — G4733 Obstructive sleep apnea (adult) (pediatric): Secondary | ICD-10-CM | POA: Diagnosis not present

## 2015-01-13 DIAGNOSIS — J45909 Unspecified asthma, uncomplicated: Secondary | ICD-10-CM | POA: Diagnosis not present

## 2015-02-05 ENCOUNTER — Ambulatory Visit (INDEPENDENT_AMBULATORY_CARE_PROVIDER_SITE_OTHER): Payer: Commercial Managed Care - HMO | Admitting: *Deleted

## 2015-02-05 DIAGNOSIS — I5022 Chronic systolic (congestive) heart failure: Secondary | ICD-10-CM

## 2015-02-05 DIAGNOSIS — I429 Cardiomyopathy, unspecified: Secondary | ICD-10-CM

## 2015-02-07 DIAGNOSIS — G4733 Obstructive sleep apnea (adult) (pediatric): Secondary | ICD-10-CM | POA: Diagnosis not present

## 2015-02-07 NOTE — Progress Notes (Signed)
Remote ICD transmission.   

## 2015-02-19 DIAGNOSIS — H5210 Myopia, unspecified eye: Secondary | ICD-10-CM | POA: Diagnosis not present

## 2015-02-19 DIAGNOSIS — H524 Presbyopia: Secondary | ICD-10-CM | POA: Diagnosis not present

## 2015-02-21 ENCOUNTER — Telehealth: Payer: Self-pay | Admitting: *Deleted

## 2015-02-21 LAB — CUP PACEART REMOTE DEVICE CHECK
Battery Remaining Longevity: 121 mo
Battery Voltage: 3.01 V
Brady Statistic RV Percent Paced: 0.95 %
Date Time Interrogation Session: 20161102073427
HighPow Impedance: 66 Ohm
Implantable Lead Implant Date: 20140813
Implantable Lead Location: 753860
Implantable Lead Model: 6935
Lead Channel Impedance Value: 437 Ohm
Lead Channel Pacing Threshold Amplitude: 0.625 V
Lead Channel Pacing Threshold Pulse Width: 0.4 ms
Lead Channel Sensing Intrinsic Amplitude: 11 mV
Lead Channel Setting Pacing Amplitude: 2 V
Lead Channel Setting Pacing Pulse Width: 0.4 ms
Lead Channel Setting Sensing Sensitivity: 0.3 mV
MDC IDC MSMT LEADCHNL RV IMPEDANCE VALUE: 342 Ohm
MDC IDC MSMT LEADCHNL RV SENSING INTR AMPL: 11 mV

## 2015-02-21 NOTE — Telephone Encounter (Signed)
Spoke to patient regarding abnormal Optivol. Patient states that she has been ShOB x 2-3 weeks and has had some wt gain. No LE edema, abdomenal bloating, or CP.   Will discuss sx's w/SK and notify patient of any recommendations.

## 2015-02-21 NOTE — Telephone Encounter (Signed)
Spoke to Dr.Klein about abnormal optivol and patient sx's. Dr.Klein recommends that pt take 51m of lasix x 3 days.   Informed patient of Dr.Klein's recommendations and asked patient to send a remote on 11/23 to reevaluate optivol. Patient voiced understanding.   I also spoke to patient [facility] Patient was appreciative of the information given and wants to be enrolled. Will forward to LSharman Cheek RTherapist, sports

## 2015-02-25 ENCOUNTER — Encounter: Payer: Self-pay | Admitting: Cardiology

## 2015-03-09 DIAGNOSIS — G4733 Obstructive sleep apnea (adult) (pediatric): Secondary | ICD-10-CM | POA: Diagnosis not present

## 2015-03-13 ENCOUNTER — Inpatient Hospital Stay: Payer: Commercial Managed Care - HMO | Attending: Oncology

## 2015-03-13 ENCOUNTER — Inpatient Hospital Stay (HOSPITAL_BASED_OUTPATIENT_CLINIC_OR_DEPARTMENT_OTHER): Payer: Commercial Managed Care - HMO | Admitting: Oncology

## 2015-03-13 VITALS — BP 105/70 | HR 91 | Temp 98.7°F | Resp 18 | Wt 245.4 lb

## 2015-03-13 DIAGNOSIS — D696 Thrombocytopenia, unspecified: Secondary | ICD-10-CM

## 2015-03-13 DIAGNOSIS — Z95810 Presence of automatic (implantable) cardiac defibrillator: Secondary | ICD-10-CM

## 2015-03-13 DIAGNOSIS — I429 Cardiomyopathy, unspecified: Secondary | ICD-10-CM

## 2015-03-13 DIAGNOSIS — R161 Splenomegaly, not elsewhere classified: Secondary | ICD-10-CM

## 2015-03-13 DIAGNOSIS — I5022 Chronic systolic (congestive) heart failure: Secondary | ICD-10-CM | POA: Insufficient documentation

## 2015-03-13 DIAGNOSIS — Z808 Family history of malignant neoplasm of other organs or systems: Secondary | ICD-10-CM

## 2015-03-13 DIAGNOSIS — Z853 Personal history of malignant neoplasm of breast: Secondary | ICD-10-CM | POA: Insufficient documentation

## 2015-03-13 DIAGNOSIS — Z7982 Long term (current) use of aspirin: Secondary | ICD-10-CM | POA: Insufficient documentation

## 2015-03-13 DIAGNOSIS — E119 Type 2 diabetes mellitus without complications: Secondary | ICD-10-CM | POA: Insufficient documentation

## 2015-03-13 DIAGNOSIS — R05 Cough: Secondary | ICD-10-CM | POA: Diagnosis not present

## 2015-03-13 DIAGNOSIS — Z79899 Other long term (current) drug therapy: Secondary | ICD-10-CM | POA: Insufficient documentation

## 2015-03-13 LAB — CBC WITH DIFFERENTIAL/PLATELET
BASOS ABS: 0 10*3/uL (ref 0–0.1)
Basophils Relative: 1 %
EOS ABS: 0.1 10*3/uL (ref 0–0.7)
Eosinophils Relative: 2 %
HCT: 34.5 % — ABNORMAL LOW (ref 35.0–47.0)
HEMOGLOBIN: 11 g/dL — AB (ref 12.0–16.0)
LYMPHS ABS: 0.5 10*3/uL — AB (ref 1.0–3.6)
Lymphocytes Relative: 10 %
MCH: 27.9 pg (ref 26.0–34.0)
MCHC: 31.7 g/dL — ABNORMAL LOW (ref 32.0–36.0)
MCV: 87.8 fL (ref 80.0–100.0)
Monocytes Absolute: 0.3 10*3/uL (ref 0.2–0.9)
Monocytes Relative: 6 %
Neutro Abs: 3.8 10*3/uL (ref 1.4–6.5)
Neutrophils Relative %: 81 %
Platelets: 105 10*3/uL — ABNORMAL LOW (ref 150–440)
RBC: 3.93 MIL/uL (ref 3.80–5.20)
RDW: 14.2 % (ref 11.5–14.5)
WBC: 4.6 10*3/uL (ref 3.6–11.0)

## 2015-03-13 NOTE — Progress Notes (Signed)
Patient has had a cold with cough for the last month.

## 2015-04-04 NOTE — Progress Notes (Signed)
[place]  Telephone:(336) [phone] [phone]) [phone]  ID: Katherine Rosales OB: 06/13/1950  MR#: [id number]  KZS#:[id number]  Patient Care Team: Dion Body, MD as PCP - General (Family Medicine)  CHIEF COMPLAINT:  Chief Complaint  Patient presents with  . thrombocytopenia    INTERVAL HISTORY: Patient returns to clinic today for repeat laboratory work and further evaluation. She has had an increased cough and congestion over the past several weeks, but otherwise has felt well and is asymptomatic. She has no neurologic complaints.  She has no recent fevers. She has a good appetite and denies weight loss. She denies any chest pain or shortness of breath. She denies any easy bleeding or bruising. She has no nausea, vomiting, constipation, or diarrhea. She has no urinary complaints. Patient offers no further specific complaints today.   REVIEW OF SYSTEMS:   Review of Systems  Constitutional: Negative.   Respiratory: Positive for cough and sputum production.   Cardiovascular: Negative.   Gastrointestinal: Negative.   Musculoskeletal: Negative.   Neurological: Negative.   Endo/Heme/Allergies: Does not bruise/bleed easily.    As per HPI. Otherwise, a complete review of systems is negatve.  PAST MEDICAL HISTORY: Past Medical History  Diagnosis Date  . Systolic heart failure, chronic   . Cardiomyopathy, nonischemic   . ICD (implantable cardiac defibrillator), singleMedtronic   . Diabetes mellitus     Type II Diabetes  . 7322 Lead-Medtronic   . Automatic implantable cardioverter-defibrillator in situ   . Spleen enlarged   . Breast cancer 03/1995    PAST SURGICAL HISTORY: Past Surgical History  Procedure Laterality Date  . Breast lumpectomy    . Tonsillectomy    . Tubal ligation    . Pacemaker insertion  06/30/2005    Medtronic Virtuoso VR ICD - Nonischemic cardiomyopathy potentially related to adriamycin, class 3 congestive failture with a narrow  QRS and a negative TDI  . Cholecystectomy    . Joint replacement  2009    Total Knee Replacement, Left  . Implantable cardioverter defibrillator (icd) generator change N/A 11/15/2012    Procedure: ICD GENERATOR CHANGE;  Surgeon: Deboraha Sprang, MD;  Location: [facility] CATH LAB;  Service: Cardiovascular;  Laterality: N/A;  . Lead revision N/A 11/15/2012    Procedure: LEAD REVISION;  Surgeon: Deboraha Sprang, MD;  Location: [facility] CATH LAB;  Service: Cardiovascular;  Laterality: N/A;  . Breast biopsy Right     2 neg bx   . Breast excisional biopsy Right 03/1995    + breast ca chemo and rad  . Breast excisional biopsy Right 2011    surgical bx neg    FAMILY HISTORY Family History  Problem Relation Age of Onset  . Prostate cancer Father        ADVANCED DIRECTIVES:    HEALTH MAINTENANCE: Social History  Substance Use Topics  . Smoking status: Never Smoker   . Smokeless tobacco: Not on file  . Alcohol Use: No     Colonoscopy:  PAP:  Bone density:  Lipid panel:  No Known Allergies  Current Outpatient Prescriptions  Medication Sig Dispense Refill  . aspirin 81 MG tablet Take 81 mg by mouth daily.    . Calcium Carbonate-Vitamin D (CALTRATE 600+D) 600-400 MG-UNIT per tablet Take 1 tablet by mouth 2 (two) times daily.      . carvedilol (COREG) 12.5 MG tablet Take 1.5 tablets (18.75 mg total) by mouth 2 (two) times daily with a meal. 90 tablet 10  . docusate sodium (  COLACE) 100 MG capsule Take 100 mg by mouth 2 (two) times daily.      . furosemide (LASIX) 20 MG tablet Take 20 mg by mouth See admin instructions. Takes three tablets weekly. Sundays wednesdays and Friday    . gabapentin (NEURONTIN) 600 MG tablet Take 600 mg by mouth 2 (two) times daily.    Marland Kitchen glimepiride (AMARYL) 2 MG tablet Take 2 mg by mouth daily before breakfast.    . levothyroxine (SYNTHROID, LEVOTHROID) 112 MCG tablet     . lisinopril (PRINIVIL,ZESTRIL) 20 MG tablet Take 20 mg by mouth daily.      Marland Kitchen loratadine (CLARITIN)  10 MG tablet Take 10 mg by mouth daily.      . Multiple Vitamins-Minerals (CENTRUM SILVER) tablet Take 1 tablet by mouth daily.      . Multiple Vitamins-Minerals (OCUVITE PO) Take by mouth daily.    Marland Kitchen omeprazole (PRILOSEC) 20 MG capsule Take 20 mg by mouth daily.      . sertraline (ZOLOFT) 100 MG tablet Take 100 mg by mouth 2 (two) times daily.     . SYMBICORT 160-4.5 MCG/ACT inhaler      No current facility-administered medications for this visit.    OBJECTIVE: Filed Vitals:   03/13/15 1426  BP: 105/70  Pulse: 91  Temp: 98.7 F (37.1 C)  Resp: 18     Body mass index is 49.53 kg/(m^2).    ECOG FS:0 - Asymptomatic  General: Well-developed, well-nourished, no acute distress. Eyes: anicteric sclera. Lungs: Clear to auscultation bilaterally. Heart: Regular rate and rhythm. No rubs, murmurs, or gallops. Abdomen: Soft, nontender, nondistended. No organomegaly noted, normoactive bowel sounds. Musculoskeletal: No edema, cyanosis, or clubbing. Neuro: Alert, answering all questions appropriately. Cranial nerves grossly intact. Skin: No rashes or petechiae noted. Psych: Normal affect.    LAB RESULTS:  Lab Results  Component Value Date   NA 141 11/08/2012   K 4.7 11/08/2012   CL 102 11/08/2012   CO2 21 11/08/2012   GLUCOSE 121* 11/08/2012   BUN 21 11/08/2012   CREATININE 1.10* 11/08/2012   CALCIUM 9.7 11/08/2012   GFRNONAA 54* 11/08/2012   GFRAA 62 11/08/2012    Lab Results  Component Value Date   WBC 4.6 03/13/2015   NEUTROABS 3.8 03/13/2015   HGB 11.0* 03/13/2015   HCT 34.5* 03/13/2015   MCV 87.8 03/13/2015   PLT 105* 03/13/2015     STUDIES: No results found.  ASSESSMENT: Autoimmune thrombocytopenia.  PLAN:    1. Thrombocytopenia: Patient's platelet antibodies are positive. She also has a positive ANA, but this is likely clinically insignificant. Finally, she also has mild splenomegaly which is also likely contributing.  Her platelets have slightly increased  and are now greater than 100, therefore no intervention is needed at this time. She does not require treatment at this time. The remainder of her laboratory work was either negative or within normal limits. Can consider repeating platelet antibodies, because these occasionally can be transient in nature.  Return to clinic in 3 months with repeat laboratory work and then in 6 months with repeat laboratory work and further evaluation.  2. Cough/congestion: Continue OTC treatments as needed.  Patient expressed understanding and was in agreement with this plan. She also understands that She can call clinic at any time with any questions, concerns, or complaints.    Lloyd Huger, MD   04/04/2015 12:59 PM

## 2015-04-09 ENCOUNTER — Other Ambulatory Visit: Payer: Self-pay | Admitting: Family Medicine

## 2015-04-09 DIAGNOSIS — N182 Chronic kidney disease, stage 2 (mild): Secondary | ICD-10-CM | POA: Diagnosis not present

## 2015-04-09 DIAGNOSIS — F411 Generalized anxiety disorder: Secondary | ICD-10-CM | POA: Insufficient documentation

## 2015-04-09 DIAGNOSIS — E1143 Type 2 diabetes mellitus with diabetic autonomic (poly)neuropathy: Secondary | ICD-10-CM | POA: Diagnosis not present

## 2015-04-09 DIAGNOSIS — F331 Major depressive disorder, recurrent, moderate: Secondary | ICD-10-CM | POA: Diagnosis not present

## 2015-04-09 DIAGNOSIS — R109 Unspecified abdominal pain: Secondary | ICD-10-CM

## 2015-04-09 DIAGNOSIS — E039 Hypothyroidism, unspecified: Secondary | ICD-10-CM | POA: Diagnosis not present

## 2015-04-09 DIAGNOSIS — G4733 Obstructive sleep apnea (adult) (pediatric): Secondary | ICD-10-CM | POA: Diagnosis not present

## 2015-04-11 ENCOUNTER — Ambulatory Visit
Admission: RE | Admit: 2015-04-11 | Discharge: 2015-04-11 | Disposition: A | Discharge status: Home / Self Care | Payer: Commercial Managed Care - HMO | Source: Ambulatory Visit | Attending: Family Medicine | Admitting: Family Medicine

## 2015-04-11 DIAGNOSIS — Z9049 Acquired absence of other specified parts of digestive tract: Secondary | ICD-10-CM | POA: Insufficient documentation

## 2015-04-11 DIAGNOSIS — R109 Unspecified abdominal pain: Secondary | ICD-10-CM | POA: Diagnosis not present

## 2015-04-11 DIAGNOSIS — R161 Splenomegaly, not elsewhere classified: Secondary | ICD-10-CM | POA: Diagnosis not present

## 2015-04-16 ENCOUNTER — Other Ambulatory Visit: Payer: Self-pay | Admitting: Family Medicine

## 2015-04-16 DIAGNOSIS — R938 Abnormal findings on diagnostic imaging of other specified body structures: Secondary | ICD-10-CM | POA: Diagnosis not present

## 2015-04-16 DIAGNOSIS — F331 Major depressive disorder, recurrent, moderate: Secondary | ICD-10-CM | POA: Diagnosis not present

## 2015-04-16 DIAGNOSIS — R9389 Abnormal findings on diagnostic imaging of other specified body structures: Secondary | ICD-10-CM

## 2015-04-22 ENCOUNTER — Ambulatory Visit (INDEPENDENT_AMBULATORY_CARE_PROVIDER_SITE_OTHER): Payer: Commercial Managed Care - HMO | Admitting: Internal Medicine

## 2015-04-22 ENCOUNTER — Encounter: Payer: Self-pay | Admitting: Internal Medicine

## 2015-04-22 VITALS — BP 120/70 | HR 68 | Ht 59.0 in | Wt 233.0 lb

## 2015-04-22 DIAGNOSIS — I498 Other specified cardiac arrhythmias: Secondary | ICD-10-CM

## 2015-04-22 DIAGNOSIS — I429 Cardiomyopathy, unspecified: Secondary | ICD-10-CM

## 2015-04-22 DIAGNOSIS — Z95810 Presence of automatic (implantable) cardiac defibrillator: Secondary | ICD-10-CM

## 2015-04-22 DIAGNOSIS — I5022 Chronic systolic (congestive) heart failure: Secondary | ICD-10-CM

## 2015-04-22 DIAGNOSIS — F4321 Adjustment disorder with depressed mood: Secondary | ICD-10-CM

## 2015-04-22 NOTE — Progress Notes (Signed)
Patient Care Team: Dion Body, MD as PCP - General (Family Medicine)   HPI  Katherine Rosales is a 65 y.o. female Seen in followup for ICD implanted for nonischemic cardiac myopathy and primary prevention. She has chronic systolic failure. She underwent generator replacement in 8/14. She is a Medtronic device.  The patient denies chest pain, shortness of breath is stable and chronic  NO nocturnal dyspnea, orthopnea or peripheral edema.  There have been no palpitations, lightheadedness or syncope.   Her father has passed away intercurrently.   Her mom has died  She is extremely fatigued and tearful but some better with celexa  Abnormal scan abd   She has a history of sleep apnea. He was treated with BiPAP years ago. She has been noncompliant. Past Medical History  Diagnosis Date  . Systolic heart failure, chronic ([place])   . Cardiomyopathy, nonischemic ([place])   . ICD (implantable cardiac defibrillator), singleMedtronic   . Diabetes mellitus     Type II Diabetes  . 0370 Lead-Medtronic   . Automatic implantable cardioverter-defibrillator in situ   . Spleen enlarged   . Breast cancer ([place]) 03/1995    Past Surgical History  Procedure Laterality Date  . Breast lumpectomy    . Tonsillectomy    . Tubal ligation    . Pacemaker insertion  06/30/2005    Medtronic Virtuoso VR ICD - Nonischemic cardiomyopathy potentially related to adriamycin, class 3 congestive failture with a narrow QRS and a negative TDI  . Cholecystectomy    . Joint replacement  2009    Total Knee Replacement, Left  . Implantable cardioverter defibrillator (icd) generator change N/A 11/15/2012    Procedure: ICD GENERATOR CHANGE;  Surgeon: Deboraha Sprang, MD;  Location: [facility] CATH LAB;  Service: Cardiovascular;  Laterality: N/A;  . Lead revision N/A 11/15/2012    Procedure: LEAD REVISION;  Surgeon: Deboraha Sprang, MD;  Location: [facility] CATH LAB;  Service: Cardiovascular;  Laterality: N/A;  . Breast biopsy Right     2  neg bx   . Breast excisional biopsy Right 03/1995    + breast ca chemo and rad  . Breast excisional biopsy Right 2011    surgical bx neg    Current Outpatient Prescriptions  Medication Sig Dispense Refill  . furosemide (LASIX) 20 MG tablet Take 1 tablet by mouth daily.    Marland Kitchen aspirin 81 MG tablet Take 81 mg by mouth daily.    . Calcium Carbonate-Vitamin D (CALTRATE 600+D) 600-400 MG-UNIT per tablet Take 1 tablet by mouth 2 (two) times daily.      . carvedilol (COREG) 12.5 MG tablet Take 1.5 tablets (18.75 mg total) by mouth 2 (two) times daily with a meal. 90 tablet 10  . citalopram (CELEXA) 20 MG tablet Take 1 tablet by mouth daily. WILL BEGIN TO TAKE AFTER TITRATE DOWN OFF OF ZOLOFT    . docusate sodium (COLACE) 100 MG capsule Take 100 mg by mouth 2 (two) times daily.      Marland Kitchen gabapentin (NEURONTIN) 600 MG tablet Take 600 mg by mouth 2 (two) times daily.    Marland Kitchen glimepiride (AMARYL) 2 MG tablet Take 2 mg by mouth daily before breakfast.    . levothyroxine (SYNTHROID, LEVOTHROID) 112 MCG tablet     . lisinopril (PRINIVIL,ZESTRIL) 20 MG tablet Take 20 mg by mouth daily.      Marland Kitchen loratadine (CLARITIN) 10 MG tablet Take 10 mg by mouth daily.      . Multiple Vitamins-Minerals (CENTRUM SILVER)  tablet Take 1 tablet by mouth daily.      . Multiple Vitamins-Minerals (OCUVITE PO) Take by mouth daily.    Marland Kitchen omeprazole (PRILOSEC) 20 MG capsule Take 20 mg by mouth daily.      . sertraline (ZOLOFT) 100 MG tablet Take 100 mg by mouth 2 (two) times daily.     . SYMBICORT 160-4.5 MCG/ACT inhaler      No current facility-administered medications for this visit.    No Known Allergies  Review of Systems negative except from HPI and PMH  Physical Exam BP 120/70 mmHg  Pulse 68  Ht 4' 11"  (1.499 m)  Wt 233 lb (105.688 kg)  BMI 47.04 kg/m2 Well developed and nourished in no acute distress HENT normal Neck supple with JVP-flat Clear Regular rate and rhythm, no murmurs or gallops Abd-soft with active  BS No Clubbing cyanosis edema Skin-warm and dry A & Oriented  Grossly normal sensory and motor function Device pocket well healed; without hematoma or erythema without superficial anastomoses  ECG demonstrates sinus rhythm at  70 16/09/38 PVCs trigemininyPoor R wave progression  Assessment and  Plan  Nonischemic cardiomyopathy  Chronic systolic heart failure  Hypertension     Trigemniny  Implantable defibrillator-Medtronic  The patient's device was interrogated.  The information was reviewed. No changes were made in the programming.    Sleep apnea  -- non compliant   Euvolemic continue current meds  Blood pressure reasonably controlled  We reveiwed [place] report demonstrating a kidney cyst splenomegaly and abnormal liver I have encouraged her to let us know what Ct shows  Trigeminy new but no clearly attrtubla symptoms; histograms do not suggest significant burden  Volume overloaded and optivol up, will increase lasix 20>>40 qd x 4d

## 2015-04-22 NOTE — Patient Instructions (Signed)
Medication Instructions: 1) increase lasix (furosemide) to 20 mg two tablets (40 mg) once daily x 4 days, then resume 20 mg once daily  Labwork: - none  Procedures/Testing: - none  Follow-Up: - Remote monitoring is used to monitor your Pacemaker of ICD from home. This monitoring reduces the number of office visits required to check your device to one time per year. It allows [place] to keep an eye on the functioning of your device to ensure it is working properly. You are scheduled for a device check from home on 07/22/15. You may send your transmission at any time that day. If you have a wireless device, the transmission will be sent automatically. After your physician reviews your transmission, you will receive a postcard with your next transmission date.  - Your physician wants you to follow-up in: 1 year with Dr. [name]. You will receive a reminder letter in the mail two months in advance. If you don't receive a letter, please call our office to schedule the follow-up appointment.  Any Additional Special Instructions Will Be Listed Below (If Applicable). - Call Dr. [name] with the results of your CT when available please- [phone].

## 2015-04-24 ENCOUNTER — Ambulatory Visit
Admission: RE | Admit: 2015-04-24 | Discharge: 2015-04-24 | Disposition: A | Discharge status: Home / Self Care | Payer: Commercial Managed Care - HMO | Source: Ambulatory Visit | Attending: Family Medicine | Admitting: Family Medicine

## 2015-04-24 ENCOUNTER — Other Ambulatory Visit: Payer: Self-pay | Admitting: Family Medicine

## 2015-04-24 DIAGNOSIS — R188 Other ascites: Secondary | ICD-10-CM | POA: Diagnosis not present

## 2015-04-24 DIAGNOSIS — R938 Abnormal findings on diagnostic imaging of other specified body structures: Secondary | ICD-10-CM | POA: Insufficient documentation

## 2015-04-24 DIAGNOSIS — N2889 Other specified disorders of kidney and ureter: Secondary | ICD-10-CM | POA: Diagnosis not present

## 2015-04-24 DIAGNOSIS — N281 Cyst of kidney, acquired: Secondary | ICD-10-CM | POA: Insufficient documentation

## 2015-04-24 DIAGNOSIS — R161 Splenomegaly, not elsewhere classified: Secondary | ICD-10-CM | POA: Insufficient documentation

## 2015-04-24 DIAGNOSIS — R9389 Abnormal findings on diagnostic imaging of other specified body structures: Secondary | ICD-10-CM

## 2015-04-24 HISTORY — DX: Essential (primary) hypertension: I10

## 2015-04-24 MED ORDER — IOHEXOL 350 MG/ML SOLN
100.0000 mL | Freq: Once | INTRAVENOUS | Status: AC | PRN
  Administered 2015-04-24: 100 mL via INTRAVENOUS

## 2015-04-29 LAB — CUP PACEART INCLINIC DEVICE CHECK
Brady Statistic RV Percent Paced: 0.33 %
HighPow Impedance: 57 Ohm
Implantable Lead Implant Date: 20140813
Implantable Lead Location: 753860
Lead Channel Pacing Threshold Amplitude: 0.75 V
Lead Channel Pacing Threshold Pulse Width: 0.4 ms
Lead Channel Setting Pacing Amplitude: 2 V
Lead Channel Setting Pacing Pulse Width: 0.4 ms
MDC IDC LEAD MODEL: 6935
MDC IDC MSMT BATTERY REMAINING LONGEVITY: 119 mo
MDC IDC MSMT BATTERY VOLTAGE: 3.01 V
MDC IDC MSMT LEADCHNL RV IMPEDANCE VALUE: 323 Ohm
MDC IDC MSMT LEADCHNL RV IMPEDANCE VALUE: 380 Ohm
MDC IDC MSMT LEADCHNL RV SENSING INTR AMPL: 10 mV
MDC IDC SESS DTM: 20170117180728
MDC IDC SET LEADCHNL RV SENSING SENSITIVITY: 0.3 mV

## 2015-05-02 ENCOUNTER — Encounter: Payer: Self-pay | Admitting: Internal Medicine

## 2015-05-05 ENCOUNTER — Other Ambulatory Visit: Payer: Self-pay | Admitting: Physician Assistant

## 2015-05-05 DIAGNOSIS — K7469 Other cirrhosis of liver: Secondary | ICD-10-CM | POA: Diagnosis not present

## 2015-05-05 DIAGNOSIS — R1319 Other dysphagia: Secondary | ICD-10-CM | POA: Diagnosis not present

## 2015-05-05 DIAGNOSIS — R1031 Right lower quadrant pain: Secondary | ICD-10-CM | POA: Diagnosis not present

## 2015-05-05 DIAGNOSIS — R1032 Left lower quadrant pain: Secondary | ICD-10-CM | POA: Diagnosis not present

## 2015-05-07 ENCOUNTER — Ambulatory Visit
Admission: RE | Admit: 2015-05-07 | Discharge: 2015-05-07 | Disposition: A | Discharge status: Home / Self Care | Payer: Commercial Managed Care - HMO | Source: Ambulatory Visit | Attending: Physician Assistant | Admitting: Physician Assistant

## 2015-05-07 DIAGNOSIS — K7460 Unspecified cirrhosis of liver: Secondary | ICD-10-CM | POA: Diagnosis not present

## 2015-05-07 DIAGNOSIS — K7469 Other cirrhosis of liver: Secondary | ICD-10-CM

## 2015-05-07 DIAGNOSIS — R932 Abnormal findings on diagnostic imaging of liver and biliary tract: Secondary | ICD-10-CM | POA: Insufficient documentation

## 2015-05-10 DIAGNOSIS — G4733 Obstructive sleep apnea (adult) (pediatric): Secondary | ICD-10-CM | POA: Diagnosis not present

## 2015-05-13 DIAGNOSIS — K7460 Unspecified cirrhosis of liver: Secondary | ICD-10-CM | POA: Diagnosis not present

## 2015-05-20 ENCOUNTER — Telehealth: Payer: Self-pay | Admitting: Internal Medicine

## 2015-05-20 NOTE — Telephone Encounter (Signed)
Request for surgical clearance:    Clearance for Pacer  1. What type of surgery is being performed? Upper endoscopy   2. When is this surgery scheduled? 05/23/15  3. Are there any medications that need to be held prior to surgery and how long? n/a   4. Name of physician performing surgery? [facility]   5. What is your office phone and fax number? [phone]- faxing clearance today

## 2015-05-20 NOTE — Telephone Encounter (Signed)
Fax received. Will review with Dr. [name].

## 2015-05-22 ENCOUNTER — Encounter: Payer: Self-pay | Admitting: *Deleted

## 2015-05-22 NOTE — Telephone Encounter (Signed)
Per Maudie Mercury in Medical Records, form was faxed back yesterday at 1:55 pm. I called and spoke with Anderson Malta at GI- she states they did not receive. I have asked Maudie Mercury to please re-fax.

## 2015-05-22 NOTE — Telephone Encounter (Signed)
Form was left in medical records at [facility] late on 2/14 to be faxed.  I have sent a message to Maudie Mercury in Medical Records to confirm if this has been sent since I'm in [place] this morning.

## 2015-05-22 NOTE — Telephone Encounter (Signed)
[place] calling stating they need this clearance done today pt is to do procedure 05/23/15

## 2015-05-23 ENCOUNTER — Encounter: Admission: RE | Payer: Self-pay | Source: Ambulatory Visit

## 2015-05-23 ENCOUNTER — Ambulatory Visit
Admission: RE | Admit: 2015-05-23 | Payer: Commercial Managed Care - HMO | Source: Ambulatory Visit | Admitting: Unknown Physician Specialty

## 2015-05-23 SURGERY — ESOPHAGOGASTRODUODENOSCOPY (EGD) WITH PROPOFOL
Anesthesia: General

## 2015-05-27 DIAGNOSIS — K7460 Unspecified cirrhosis of liver: Secondary | ICD-10-CM | POA: Diagnosis not present

## 2015-05-27 DIAGNOSIS — F331 Major depressive disorder, recurrent, moderate: Secondary | ICD-10-CM | POA: Diagnosis not present

## 2015-05-27 DIAGNOSIS — J069 Acute upper respiratory infection, unspecified: Secondary | ICD-10-CM | POA: Diagnosis not present

## 2015-06-07 DIAGNOSIS — G4733 Obstructive sleep apnea (adult) (pediatric): Secondary | ICD-10-CM | POA: Diagnosis not present

## 2015-06-10 ENCOUNTER — Inpatient Hospital Stay: Payer: Commercial Managed Care - HMO | Attending: Oncology

## 2015-06-10 ENCOUNTER — Other Ambulatory Visit: Payer: Commercial Managed Care - HMO

## 2015-06-10 ENCOUNTER — Encounter: Payer: Commercial Managed Care - HMO | Attending: Physician Assistant | Admitting: Dietician

## 2015-06-10 ENCOUNTER — Encounter: Payer: Self-pay | Admitting: Dietician

## 2015-06-10 VITALS — Ht 59.5 in | Wt 216.5 lb

## 2015-06-10 DIAGNOSIS — K7460 Unspecified cirrhosis of liver: Secondary | ICD-10-CM | POA: Insufficient documentation

## 2015-06-10 DIAGNOSIS — D696 Thrombocytopenia, unspecified: Secondary | ICD-10-CM | POA: Insufficient documentation

## 2015-06-10 DIAGNOSIS — E119 Type 2 diabetes mellitus without complications: Secondary | ICD-10-CM | POA: Insufficient documentation

## 2015-06-10 DIAGNOSIS — I10 Essential (primary) hypertension: Secondary | ICD-10-CM | POA: Diagnosis not present

## 2015-06-10 DIAGNOSIS — E669 Obesity, unspecified: Secondary | ICD-10-CM

## 2015-06-10 LAB — CBC
HCT: 35.2 % (ref 35.0–47.0)
Hemoglobin: 11.6 g/dL — ABNORMAL LOW (ref 12.0–16.0)
MCH: 28 pg (ref 26.0–34.0)
MCHC: 32.9 g/dL (ref 32.0–36.0)
MCV: 85 fL (ref 80.0–100.0)
PLATELETS: 80 10*3/uL — AB (ref 150–440)
RBC: 4.15 MIL/uL (ref 3.80–5.20)
RDW: 17 % — AB (ref 11.5–14.5)
WBC: 3.3 10*3/uL — AB (ref 3.6–11.0)

## 2015-06-10 NOTE — Progress Notes (Signed)
Medical Nutrition Therapy: Visit start time: 1130  end time: 1230  Assessment:  Diagnosis: HTN, cirrhosis, diabetes Past medical history: CHF, sleep apnea Psychosocial issues/ stress concerns: patient reports moderate stress level; also difficulty dealing with diagnosis of cirrhosis Preferred learning method:  . Auditory . Visual  Current weight: 216.5lbs  Height: 4'11.5" Medications, supplements: reviewed list in chart with patient.  Progress and evaluation: Patient reports decreasing intake of protein foods and sodium for liver health and HTN.          She also has been decreasing sugar intake to manage blood sugars.          Patient states she has a benign mass next to kidney, and a kidney stone. She has been told to avoid dairy foods.   Physical activity: none, due to recent flu-like illness, some back pain when walking, shortness of breath during exercise  Dietary Intake:  Usual eating pattern includes 3 meals and 2 snacks per day. Dining out frequency: 0 meals per week.  Breakfast: sometimes 1-2 poached eggs with pepper or ketchup and 1pc toast; or 4-pack cheese crackers Snack: apple, 1-2 per day for snacks. Lunch: steamed or stir-fried vegetables, or roasted brussels sprouts, sometimes salad. Frozen fish fillet.  Snack: sometimes cool whip, rarely with a few pecans.  Supper: same as lunch, mostly vegetables. Avoiding meats.  Snack: none Beverages: water  Nutrition Care Education: Topics covered: weight management to reduce fat in liver; diabetes Basic nutrition: basic food groups, appropriate nutrient balance, appropriate meal and snack schedule, general nutrition guidelines    Weight control: benefits of weight control, behavioral changes for weight loss: portion control; 1300kcal meal plan Advanced nutrition: food label reading Diabetes: appropriate carb intake and balance, importance of adequate protein for BG control as well as maintaining lean body mass.  Hypertension:  identifying high sodium foods, identifying food sources of Calcium, potassium, magnesium Other:  Controlling calcium intake to prevent kidney stones.   Nutritional Diagnosis:  [place]-3.3 Overweight/obesity As related to history of excess caloric intake, inactivity.  As evidenced by patient report. NI-5.3 Inadequate protein-energy intake As related to avoidance of most protein foods for weight loss.  As evidenced by patient report.  Intervention: Instruction as noted above.   Stressed importance of balanced nutrition vs. avoidance of foods and food groups.   Set goals with patient input.   Patient declined RD follow-up at this time.  Education Materials given:  . Food lists/ Planning A Balanced Meal . Sample meal pattern/ menus . Goals/ instructions  Learner/ who was taught:  . Patient   Level of understanding: Marland Kitchen Verbalizes/ demonstrates competency  Demonstrated degree of understanding via:   Teach back Learning barriers: . None  Willingness to learn/ readiness for change: . Eager, change in progress  Monitoring and Evaluation:  Dietary intake, exercise, and body weight      follow up: prn

## 2015-06-10 NOTE — Patient Instructions (Signed)
   Make sure to include a small portion of a protein food and a small portion of some carbohydrate with each meal.   Use menus for meal ideas.   Try Mrs. Dash products or [employer] Perfect Pinch as salt substitutes. Avoid salt substitutes that look like salt, such as Salt Sense. They can be very high in potassium.  Continue to eat plenty of your healthy vegetables!

## 2015-06-11 ENCOUNTER — Other Ambulatory Visit: Payer: Commercial Managed Care - HMO

## 2015-06-11 DIAGNOSIS — I10 Essential (primary) hypertension: Secondary | ICD-10-CM | POA: Diagnosis not present

## 2015-06-11 DIAGNOSIS — E119 Type 2 diabetes mellitus without complications: Secondary | ICD-10-CM | POA: Diagnosis not present

## 2015-06-11 DIAGNOSIS — K740 Hepatic fibrosis: Secondary | ICD-10-CM | POA: Diagnosis not present

## 2015-06-11 DIAGNOSIS — K7460 Unspecified cirrhosis of liver: Secondary | ICD-10-CM | POA: Diagnosis not present

## 2015-06-11 DIAGNOSIS — G4733 Obstructive sleep apnea (adult) (pediatric): Secondary | ICD-10-CM | POA: Diagnosis not present

## 2015-06-11 DIAGNOSIS — Z79899 Other long term (current) drug therapy: Secondary | ICD-10-CM | POA: Diagnosis not present

## 2015-06-11 LAB — PLATELET ANTIBODY PROFILE, SERUM
HLA Ab Ser Ql EIA: NEGATIVE
IA/IIA ANTIBODY: NEGATIVE
IB/IX ANTIBODY: NEGATIVE
IIB/IIIA Antibody: NEGATIVE

## 2015-06-25 ENCOUNTER — Ambulatory Visit: Payer: Commercial Managed Care - HMO | Admitting: Anesthesiology

## 2015-06-25 ENCOUNTER — Encounter: Payer: Self-pay | Admitting: Anesthesiology

## 2015-06-25 ENCOUNTER — Ambulatory Visit
Admission: RE | Admit: 2015-06-25 | Discharge: 2015-06-25 | Disposition: A | Discharge status: Home / Self Care | Payer: Commercial Managed Care - HMO | Source: Ambulatory Visit | Attending: Unknown Physician Specialty | Admitting: Unknown Physician Specialty

## 2015-06-25 ENCOUNTER — Encounter
Admission: RE | Disposition: A | Discharge status: Home / Self Care | Payer: Self-pay | Source: Ambulatory Visit | Attending: Unknown Physician Specialty

## 2015-06-25 DIAGNOSIS — K7460 Unspecified cirrhosis of liver: Secondary | ICD-10-CM | POA: Diagnosis not present

## 2015-06-25 DIAGNOSIS — K2980 Duodenitis without bleeding: Secondary | ICD-10-CM | POA: Diagnosis not present

## 2015-06-25 DIAGNOSIS — Z853 Personal history of malignant neoplasm of breast: Secondary | ICD-10-CM | POA: Insufficient documentation

## 2015-06-25 DIAGNOSIS — I129 Hypertensive chronic kidney disease with stage 1 through stage 4 chronic kidney disease, or unspecified chronic kidney disease: Secondary | ICD-10-CM | POA: Diagnosis not present

## 2015-06-25 DIAGNOSIS — Z7984 Long term (current) use of oral hypoglycemic drugs: Secondary | ICD-10-CM | POA: Diagnosis not present

## 2015-06-25 DIAGNOSIS — Z9851 Tubal ligation status: Secondary | ICD-10-CM | POA: Insufficient documentation

## 2015-06-25 DIAGNOSIS — I429 Cardiomyopathy, unspecified: Secondary | ICD-10-CM | POA: Diagnosis not present

## 2015-06-25 DIAGNOSIS — Z79899 Other long term (current) drug therapy: Secondary | ICD-10-CM | POA: Insufficient documentation

## 2015-06-25 DIAGNOSIS — K228 Other specified diseases of esophagus: Secondary | ICD-10-CM | POA: Diagnosis not present

## 2015-06-25 DIAGNOSIS — R609 Edema, unspecified: Secondary | ICD-10-CM | POA: Diagnosis not present

## 2015-06-25 DIAGNOSIS — K222 Esophageal obstruction: Secondary | ICD-10-CM | POA: Diagnosis not present

## 2015-06-25 DIAGNOSIS — Z95810 Presence of automatic (implantable) cardiac defibrillator: Secondary | ICD-10-CM | POA: Diagnosis not present

## 2015-06-25 DIAGNOSIS — I5022 Chronic systolic (congestive) heart failure: Secondary | ICD-10-CM | POA: Diagnosis not present

## 2015-06-25 DIAGNOSIS — K219 Gastro-esophageal reflux disease without esophagitis: Secondary | ICD-10-CM | POA: Diagnosis not present

## 2015-06-25 DIAGNOSIS — R1310 Dysphagia, unspecified: Secondary | ICD-10-CM | POA: Diagnosis not present

## 2015-06-25 DIAGNOSIS — Z9049 Acquired absence of other specified parts of digestive tract: Secondary | ICD-10-CM | POA: Insufficient documentation

## 2015-06-25 DIAGNOSIS — K259 Gastric ulcer, unspecified as acute or chronic, without hemorrhage or perforation: Secondary | ICD-10-CM | POA: Insufficient documentation

## 2015-06-25 DIAGNOSIS — Z6841 Body Mass Index (BMI) 40.0 and over, adult: Secondary | ICD-10-CM | POA: Insufficient documentation

## 2015-06-25 DIAGNOSIS — K2970 Gastritis, unspecified, without bleeding: Secondary | ICD-10-CM | POA: Insufficient documentation

## 2015-06-25 DIAGNOSIS — K2960 Other gastritis without bleeding: Secondary | ICD-10-CM | POA: Diagnosis not present

## 2015-06-25 DIAGNOSIS — K3189 Other diseases of stomach and duodenum: Secondary | ICD-10-CM | POA: Diagnosis not present

## 2015-06-25 DIAGNOSIS — K2950 Unspecified chronic gastritis without bleeding: Secondary | ICD-10-CM | POA: Diagnosis not present

## 2015-06-25 DIAGNOSIS — N189 Chronic kidney disease, unspecified: Secondary | ICD-10-CM | POA: Diagnosis not present

## 2015-06-25 DIAGNOSIS — Z7982 Long term (current) use of aspirin: Secondary | ICD-10-CM | POA: Diagnosis not present

## 2015-06-25 DIAGNOSIS — E1122 Type 2 diabetes mellitus with diabetic chronic kidney disease: Secondary | ICD-10-CM | POA: Diagnosis not present

## 2015-06-25 DIAGNOSIS — Z96652 Presence of left artificial knee joint: Secondary | ICD-10-CM | POA: Insufficient documentation

## 2015-06-25 HISTORY — PX: ESOPHAGOGASTRODUODENOSCOPY (EGD) WITH PROPOFOL: SHX5813

## 2015-06-25 SURGERY — ESOPHAGOGASTRODUODENOSCOPY (EGD) WITH PROPOFOL
Anesthesia: General

## 2015-06-25 MED ORDER — PHENYLEPHRINE HCL 10 MG/ML IJ SOLN
INTRAMUSCULAR | Status: DC | PRN
  Administered 2015-06-25: 100 ug via INTRAVENOUS

## 2015-06-25 MED ORDER — SODIUM CHLORIDE 0.9 % IV SOLN
INTRAVENOUS | Status: DC
  Administered 2015-06-25: 08:00:00 via INTRAVENOUS

## 2015-06-25 MED ORDER — SODIUM CHLORIDE 0.9 % IV SOLN: INTRAVENOUS | Status: DC

## 2015-06-25 MED ORDER — FENTANYL CITRATE (PF) 100 MCG/2ML IJ SOLN
INTRAMUSCULAR | Status: DC | PRN
  Administered 2015-06-25: 50 ug via INTRAVENOUS

## 2015-06-25 MED ORDER — LIDOCAINE HCL (CARDIAC) 20 MG/ML IV SOLN
INTRAVENOUS | Status: DC | PRN
  Administered 2015-06-25: 30 mg via INTRAVENOUS

## 2015-06-25 MED ORDER — PROPOFOL 500 MG/50ML IV EMUL
INTRAVENOUS | Status: DC | PRN
  Administered 2015-06-25: 160 ug/kg/min via INTRAVENOUS

## 2015-06-25 MED ORDER — PROPOFOL 10 MG/ML IV BOLUS
INTRAVENOUS | Status: DC | PRN
  Administered 2015-06-25: 50 mg via INTRAVENOUS

## 2015-06-25 MED ORDER — MIDAZOLAM HCL 5 MG/5ML IJ SOLN
INTRAMUSCULAR | Status: DC | PRN
  Administered 2015-06-25: 1 mg via INTRAVENOUS

## 2015-06-25 NOTE — Anesthesia Preprocedure Evaluation (Signed)
Anesthesia Evaluation  Patient identified by MRN, date of birth, ID band Patient awake    Reviewed: Allergy & Precautions, H&P , NPO status , Patient's Chart, lab work & pertinent test results, reviewed documented beta blocker date and time   History of Anesthesia Complications Negative for: history of anesthetic complications  Airway Mallampati: II  TM Distance: >3 FB Neck ROM: full    Dental no notable dental hx. (+) Missing, Chipped, Poor Dentition   Pulmonary neg pulmonary ROS,    Pulmonary exam normal breath sounds clear to auscultation       Cardiovascular Exercise Tolerance: Good hypertension, On Medications (-) angina+CHF  (-) CAD, (-) Past MI, (-) Cardiac Stents and (-) CABG Normal cardiovascular exam(-) dysrhythmias + Cardiac Defibrillator (-) Valvular Problems/Murmurs Rhythm:regular Rate:Normal     Neuro/Psych negative neurological ROS  negative psych ROS   GI/Hepatic GERD  Medicated,(+) Cirrhosis       , NAFLD   Endo/Other  diabetes, Oral Hypoglycemic AgentsHypothyroidism Morbid obesity  Renal/GU CRFRenal disease  negative genitourinary   Musculoskeletal   Abdominal   Peds  Hematology negative hematology ROS (+)   Anesthesia Other Findings Past Medical History:   Systolic heart failure, chronic (H[facility])                        Cardiomyopathy, nonischemic ([facility])                            ICD (implantable cardiac defibrillator), singl*              Diabetes mellitus                                              Comment:Type II Diabetes   6949 Lead-Medtronic                                          Automatic implantable cardioverter-defibrillat*              Spleen enlarged                                              Breast cancer ([place])                             03/1995      Hypertension                                                 Reproductive/Obstetrics negative OB ROS                              Anesthesia Physical Anesthesia Plan  ASA: IV  Anesthesia Plan: General   Post-op Pain Management:    Induction:   Airway Management Planned:   Additional Equipment:   Intra-op Plan:   Post-operative Plan:   Informed Consent: I have reviewed  the patients History and Physical, chart, labs and discussed the procedure including the risks, benefits and alternatives for the proposed anesthesia with the patient or authorized representative who has indicated his/her understanding and acceptance.   Dental Advisory Given  Plan Discussed with: Anesthesiologist, CRNA and Surgeon  Anesthesia Plan Comments:         Anesthesia Quick Evaluation

## 2015-06-25 NOTE — H&P (Signed)
Primary Care Physician:  Dion Body, MD Primary Gastroenterologist:  Dr. Vira Agar  Pre-Procedure History & Physical: HPI:  Katherine Rosales is a 65 y.o. female is here for an endoscopy.   Past Medical History  Diagnosis Date  . Systolic heart failure, chronic ([place])   . Cardiomyopathy, nonischemic ([place])   . ICD (implantable cardiac defibrillator), singleMedtronic   . Diabetes mellitus     Type II Diabetes  . 2694 Lead-Medtronic   . Automatic implantable cardioverter-defibrillator in situ   . Spleen enlarged   . Breast cancer ([place]) 03/1995  . Hypertension     Past Surgical History  Procedure Laterality Date  . Breast lumpectomy    . Tonsillectomy    . Tubal ligation    . Pacemaker insertion  06/30/2005    Medtronic Virtuoso VR ICD - Nonischemic cardiomyopathy potentially related to adriamycin, class 3 congestive failture with a narrow QRS and a negative TDI  . Cholecystectomy    . Joint replacement  2009    Total Knee Replacement, Left  . Implantable cardioverter defibrillator (icd) generator change N/A 11/15/2012    Procedure: ICD GENERATOR CHANGE;  Surgeon: Deboraha Sprang, MD;  Location: [facility] CATH LAB;  Service: Cardiovascular;  Laterality: N/A;  . Lead revision N/A 11/15/2012    Procedure: LEAD REVISION;  Surgeon: Deboraha Sprang, MD;  Location: [facility] CATH LAB;  Service: Cardiovascular;  Laterality: N/A;  . Breast biopsy Right     2 neg bx   . Breast excisional biopsy Right 03/1995    + breast ca chemo and rad  . Breast excisional biopsy Right 2011    surgical bx neg    Prior to Admission medications   Medication Sig Start Date End Date Taking? Authorizing Provider  aspirin 81 MG tablet Take 81 mg by mouth daily.   Yes Historical Provider, MD  Calcium Carbonate-Vitamin D (CALTRATE 600+D) 600-400 MG-UNIT per tablet Take 1 tablet by mouth 2 (two) times daily.     Yes Historical Provider, MD  carvedilol (COREG) 12.5 MG tablet Take 1.5 tablets (18.75 mg total) by mouth 2  (two) times daily with a meal. 09/06/14  Yes Deboraha Sprang, MD  citalopram (CELEXA) 20 MG tablet Take 1 tablet by mouth daily. WILL BEGIN TO TAKE AFTER TITRATE DOWN OFF OF ZOLOFT 04/16/15 04/15/16 Yes Historical Provider, MD  docusate sodium (COLACE) 100 MG capsule Take 100 mg by mouth 2 (two) times daily.     Yes Historical Provider, MD  furosemide (LASIX) 20 MG tablet Take 1 tablet by mouth daily. 08/30/14  Yes Historical Provider, MD  gabapentin (NEURONTIN) 600 MG tablet Take 600 mg by mouth 2 (two) times daily.   Yes Historical Provider, MD  glimepiride (AMARYL) 2 MG tablet Take 2 mg by mouth daily before breakfast.   Yes Historical Provider, MD  levothyroxine (SYNTHROID, LEVOTHROID) 112 MCG tablet  09/06/14  Yes Historical Provider, MD  lisinopril (PRINIVIL,ZESTRIL) 20 MG tablet Take 20 mg by mouth daily.     Yes Historical Provider, MD  loratadine (CLARITIN) 10 MG tablet Take 10 mg by mouth daily.     Yes Historical Provider, MD  Multiple Vitamins-Minerals (CENTRUM SILVER) tablet Take 1 tablet by mouth daily.     Yes Historical Provider, MD  Multiple Vitamins-Minerals (OCUVITE PO) Take by mouth daily.   Yes Historical Provider, MD  omeprazole (PRILOSEC) 20 MG capsule Take 20 mg by mouth daily.     Yes Historical Provider, MD  sertraline (ZOLOFT) 100 MG  tablet Take 100 mg by mouth 2 (two) times daily. Reported on 06/25/2015    Historical Provider, MD  [facility] 160-4.5 MCG/ACT inhaler  08/27/14   Historical Provider, MD    Allergies as of 06/12/2015  . (No Known Allergies)    Family History  Problem Relation Age of Onset  . Prostate cancer Father     Social History   Social History  . Marital Status: Widowed    Spouse Name: N/A  . Number of Children: N/A  . Years of Education: N/A   Occupational History  . Full time    Social History Main Topics  . Smoking status: Never Smoker   . Smokeless tobacco: Not on file  . Alcohol Use: No  . Drug Use: No  . Sexual Activity: Not on file    Other Topics Concern  . Not on file   Social History Narrative   Married   Does not get regular exercise    Review of Systems: See HPI, otherwise negative ROS,  Pt has a Horticulturist, commercial   Physical Exam: BP 121/65 mmHg  Pulse 73  Temp(Src) 96.7 F (35.9 C) (Tympanic)  Resp 17  Ht 4' 11"  (1.499 m)  Wt 96.163 kg (212 lb)  BMI 42.80 kg/m2  SpO2 99% General:   Alert,  pleasant and cooperative in NAD Head:  Normocephalic and atraumatic. Neck:  Supple; no masses or thyromegaly. Lungs:  Clear throughout to auscultation.    Heart:  Regular rate and rhythm. Abdomen:  Soft, nontender and nondistended. Normal bowel sounds, without guarding, and without rebound.   Neurologic:  Alert and  oriented x4;  grossly normal neurologically.  Impression/Plan: Katherine Rosales is here for an endoscopy to be performed for dysphagia and surveillance for esophageal varices  Risks, benefits, limitations, and alternatives regarding  endoscopy have been reviewed with the patient.  Questions have been answered.  All parties agreeable.   Katherine Cheers, MD  06/25/2015, 8:35 AM

## 2015-06-25 NOTE — Op Note (Signed)
[facility] Patient Name: Katherine Rosales Procedure Date: 06/25/2015 8:37 AM MRN: [id number] Account #: [id number] Date of Birth: 1951-01-22 Admit Type: Outpatient Age: 65 Room: [facility] ENDO ROOM 3 Gender: Female Note Status: Finalized Procedure:            Upper GI endoscopy Indications:          Dysphagia Providers:            Manya Silvas, MD Referring MD:         Dion Body (Referring MD) Medicines:            Propofol per Anesthesia Complications:        No immediate complications. Procedure:            Pre-Anesthesia Assessment:                       - After reviewing the risks and benefits, the patient                        was deemed in satisfactory condition to undergo the                        procedure.                       After obtaining informed consent, the endoscope was                        passed under direct vision. Throughout the procedure,                        the patient's blood pressure, pulse, and oxygen                        saturations were monitored continuously. The Endoscope                        was introduced through the mouth, and advanced to the                        second part of duodenum. The upper GI endoscopy was                        accomplished without difficulty. The patient tolerated                        the procedure well. Findings:      A mild Schatzki ring (acquired) was found at the gastroesophageal       junction. A guidewire was placed and the scope was withdrawn. Dilation       was performed with a Savary dilator with mild resistance at 16 mm and 17       mm.      Two non-bleeding superficial gastric ulcers with no stigmata of bleeding       were found in the gastric antrum. The largest lesion was 3 mm in largest       dimension. Biopsies were taken with a cold forceps for histology.       Biopsies were taken with a cold forceps for Helicobacter pylori testing.      Patchy  mild inflammation characterized by erythema and granularity was  found in the duodenal bulb. Impression:           - Mild Schatzki ring. Dilated.                       - Non-bleeding gastric ulcers with no stigmata of                        bleeding. Biopsied.                       - Duodenitis. Recommendation:       - Await pathology results. Manya Silvas, MD 06/25/2015 8:57:24 AM This report has been signed electronically. Number of Addenda: 0 Note Initiated On: 06/25/2015 8:37 AM      [facility]

## 2015-06-25 NOTE — Anesthesia Postprocedure Evaluation (Signed)
Anesthesia Post Note  Patient: Katherine Rosales  Procedure(s) Performed: Procedure(s) (LRB): ESOPHAGOGASTRODUODENOSCOPY (EGD) WITH PROPOFOL (N/A)  Patient location during evaluation: Endoscopy Anesthesia Type: General Level of consciousness: awake and alert Pain management: pain level controlled Vital Signs Assessment: post-procedure vital signs reviewed and stable Respiratory status: spontaneous breathing, nonlabored ventilation, respiratory function stable and patient connected to nasal cannula oxygen Cardiovascular status: blood pressure returned to baseline and stable Postop Assessment: no signs of nausea or vomiting Anesthetic complications: no    Last Vitals:  Filed Vitals:   06/25/15 0918 06/25/15 0928  BP: 108/61 116/63  Pulse: 70 67  Temp:    Resp: 14 17    Last Pain: There were no vitals filed for this visit.               Martha Clan

## 2015-06-25 NOTE — Transfer of Care (Signed)
Immediate Anesthesia Transfer of Care Note  Patient: Katherine Rosales  Procedure(s) Performed: Procedure(s): ESOPHAGOGASTRODUODENOSCOPY (EGD) WITH PROPOFOL (N/A)  Patient Location: PACU and Short Stay  Anesthesia Type:General  Level of Consciousness: awake, oriented and patient cooperative  Airway & Oxygen Therapy: Patient Spontanous Breathing and Patient connected to nasal cannula oxygen  Post-op Assessment: Report given to RN and Post -op Vital signs reviewed and stable  Post vital signs: Reviewed and stable  Last Vitals:  Filed Vitals:   06/25/15 0803  BP: 121/65  Pulse: 73  Temp: 35.9 C  Resp: 17    Complications: No apparent anesthesia complications

## 2015-06-26 LAB — SURGICAL PATHOLOGY

## 2015-06-30 LAB — GLUCOSE, CAPILLARY: GLUCOSE-CAPILLARY: 92 mg/dL (ref 65–99)

## 2015-07-08 DIAGNOSIS — G4733 Obstructive sleep apnea (adult) (pediatric): Secondary | ICD-10-CM | POA: Diagnosis not present

## 2015-07-10 DIAGNOSIS — G4733 Obstructive sleep apnea (adult) (pediatric): Secondary | ICD-10-CM | POA: Diagnosis not present

## 2015-07-22 ENCOUNTER — Ambulatory Visit (INDEPENDENT_AMBULATORY_CARE_PROVIDER_SITE_OTHER): Payer: Commercial Managed Care - HMO | Admitting: *Deleted

## 2015-07-22 DIAGNOSIS — Z95810 Presence of automatic (implantable) cardiac defibrillator: Secondary | ICD-10-CM | POA: Diagnosis not present

## 2015-07-22 DIAGNOSIS — I429 Cardiomyopathy, unspecified: Secondary | ICD-10-CM

## 2015-07-22 NOTE — Progress Notes (Signed)
Remote ICD transmission.   

## 2015-07-23 DIAGNOSIS — K7581 Nonalcoholic steatohepatitis (NASH): Secondary | ICD-10-CM | POA: Diagnosis not present

## 2015-07-23 DIAGNOSIS — K7460 Unspecified cirrhosis of liver: Secondary | ICD-10-CM | POA: Diagnosis not present

## 2015-08-07 DIAGNOSIS — G4733 Obstructive sleep apnea (adult) (pediatric): Secondary | ICD-10-CM | POA: Diagnosis not present

## 2015-08-25 ENCOUNTER — Other Ambulatory Visit: Payer: Self-pay | Admitting: Family Medicine

## 2015-08-25 DIAGNOSIS — Z23 Encounter for immunization: Secondary | ICD-10-CM | POA: Diagnosis not present

## 2015-08-25 DIAGNOSIS — E1143 Type 2 diabetes mellitus with diabetic autonomic (poly)neuropathy: Secondary | ICD-10-CM | POA: Diagnosis not present

## 2015-08-25 DIAGNOSIS — K7460 Unspecified cirrhosis of liver: Secondary | ICD-10-CM | POA: Diagnosis not present

## 2015-08-25 DIAGNOSIS — Z1231 Encounter for screening mammogram for malignant neoplasm of breast: Secondary | ICD-10-CM

## 2015-08-25 DIAGNOSIS — Z0000 Encounter for general adult medical examination without abnormal findings: Secondary | ICD-10-CM | POA: Diagnosis not present

## 2015-08-25 DIAGNOSIS — K7581 Nonalcoholic steatohepatitis (NASH): Secondary | ICD-10-CM | POA: Diagnosis not present

## 2015-08-25 DIAGNOSIS — E039 Hypothyroidism, unspecified: Secondary | ICD-10-CM | POA: Diagnosis not present

## 2015-08-29 ENCOUNTER — Encounter: Payer: Self-pay | Admitting: Cardiology

## 2015-08-29 LAB — CUP PACEART REMOTE DEVICE CHECK
Battery Remaining Longevity: 117 mo
HIGH POWER IMPEDANCE MEASURED VALUE: 77 Ohm
Lead Channel Impedance Value: 342 Ohm
Lead Channel Impedance Value: 399 Ohm
Lead Channel Pacing Threshold Amplitude: 0.75 V
Lead Channel Sensing Intrinsic Amplitude: 11.875 mV
Lead Channel Setting Pacing Amplitude: 2 V
MDC IDC LEAD IMPLANT DT: 20140813
MDC IDC LEAD LOCATION: 753860
MDC IDC LEAD MODEL: 6935
MDC IDC MSMT BATTERY VOLTAGE: 3.01 V
MDC IDC MSMT LEADCHNL RV PACING THRESHOLD PULSEWIDTH: 0.4 ms
MDC IDC MSMT LEADCHNL RV SENSING INTR AMPL: 11.875 mV
MDC IDC SESS DTM: 20170418052207
MDC IDC SET LEADCHNL RV PACING PULSEWIDTH: 0.4 ms
MDC IDC SET LEADCHNL RV SENSING SENSITIVITY: 0.3 mV
MDC IDC STAT BRADY RV PERCENT PACED: 0.57 %

## 2015-09-07 DIAGNOSIS — G4733 Obstructive sleep apnea (adult) (pediatric): Secondary | ICD-10-CM | POA: Diagnosis not present

## 2015-09-11 ENCOUNTER — Inpatient Hospital Stay: Payer: Commercial Managed Care - HMO | Attending: Oncology

## 2015-09-11 ENCOUNTER — Telehealth: Payer: Self-pay

## 2015-09-11 ENCOUNTER — Inpatient Hospital Stay (HOSPITAL_BASED_OUTPATIENT_CLINIC_OR_DEPARTMENT_OTHER): Payer: Commercial Managed Care - HMO | Admitting: Oncology

## 2015-09-11 VITALS — BP 131/70 | HR 77 | Temp 97.6°F | Resp 18 | Wt 210.5 lb

## 2015-09-11 DIAGNOSIS — Z853 Personal history of malignant neoplasm of breast: Secondary | ICD-10-CM | POA: Insufficient documentation

## 2015-09-11 DIAGNOSIS — E119 Type 2 diabetes mellitus without complications: Secondary | ICD-10-CM

## 2015-09-11 DIAGNOSIS — D649 Anemia, unspecified: Secondary | ICD-10-CM | POA: Diagnosis not present

## 2015-09-11 DIAGNOSIS — D696 Thrombocytopenia, unspecified: Secondary | ICD-10-CM | POA: Diagnosis not present

## 2015-09-11 DIAGNOSIS — R161 Splenomegaly, not elsewhere classified: Secondary | ICD-10-CM

## 2015-09-11 DIAGNOSIS — I5022 Chronic systolic (congestive) heart failure: Secondary | ICD-10-CM

## 2015-09-11 DIAGNOSIS — Z8042 Family history of malignant neoplasm of prostate: Secondary | ICD-10-CM | POA: Insufficient documentation

## 2015-09-11 DIAGNOSIS — I10 Essential (primary) hypertension: Secondary | ICD-10-CM

## 2015-09-11 DIAGNOSIS — I429 Cardiomyopathy, unspecified: Secondary | ICD-10-CM | POA: Diagnosis not present

## 2015-09-11 LAB — CBC
HEMATOCRIT: 34.9 % — AB (ref 35.0–47.0)
HEMOGLOBIN: 11.7 g/dL — AB (ref 12.0–16.0)
MCH: 28.8 pg (ref 26.0–34.0)
MCHC: 33.5 g/dL (ref 32.0–36.0)
MCV: 86 fL (ref 80.0–100.0)
Platelets: 91 10*3/uL — ABNORMAL LOW (ref 150–440)
RBC: 4.05 MIL/uL (ref 3.80–5.20)
RDW: 14 % (ref 11.5–14.5)
WBC: 5 10*3/uL (ref 3.6–11.0)

## 2015-09-11 NOTE — Progress Notes (Signed)
Patient does not offer any problems today.  

## 2015-09-11 NOTE — Telephone Encounter (Signed)
Patient referred to [facility] clinic by Dr [name] and Debroah Loop, device RN.  Spoke with patient and explained ICM program.  She agreed to [facility] monthly follow up.  1st ICM remote transmission scheduled 09/24/2015.  She stated she is currently feeling fine at this time and denied any fluid symptoms.

## 2015-09-21 NOTE — Progress Notes (Signed)
[place]  Telephone:(336) [phone] Fax:(336) [phone]  ID: Katherine Rosales OB: 01/19/1951  MR#: [id number]  IOE#:[id number]  Patient Care Team: Dion Body, MD as PCP - General (Family Medicine)  CHIEF COMPLAINT:  Chief Complaint  Patient presents with  . thrombocytopenia    INTERVAL HISTORY: Patient returns to clinic today for repeat laboratory work and further evaluation. She Currently feels well and is asymptomatic. She has no neurologic complaints. She denies any recent fevers or illnesses.  She has a good appetite and denies weight loss. She denies any chest pain or shortness of breath. She denies any easy bleeding or bruising. She has no nausea, vomiting, constipation, or diarrhea. She has no urinary complaints. Patient offers no specific complaints today.   REVIEW OF SYSTEMS:   Review of Systems  Constitutional: Negative.  Negative for fever, weight loss and malaise/fatigue.  Respiratory: Negative.  Negative for cough and shortness of breath.   Cardiovascular: Negative.  Negative for chest pain.  Gastrointestinal: Negative.  Negative for blood in stool and melena.  Genitourinary: Negative.   Musculoskeletal: Negative.   Neurological: Negative.  Negative for weakness.  Endo/Heme/Allergies: Does not bruise/bleed easily.  Psychiatric/Behavioral: Negative.     As per HPI. Otherwise, a complete review of systems is negatve.  PAST MEDICAL HISTORY: Past Medical History  Diagnosis Date  . Systolic heart failure, chronic ([place])   . Cardiomyopathy, nonischemic ([place])   . ICD (implantable cardiac defibrillator), singleMedtronic   . Diabetes mellitus     Type II Diabetes  . 1829 Lead-Medtronic   . Automatic implantable cardioverter-defibrillator in situ   . Spleen enlarged   . Breast cancer ([place]) 03/1995  . Hypertension     PAST SURGICAL HISTORY: Past Surgical History  Procedure Laterality Date  . Breast lumpectomy    . Tonsillectomy    .  Tubal ligation    . Pacemaker insertion  06/30/2005    Medtronic Virtuoso VR ICD - Nonischemic cardiomyopathy potentially related to adriamycin, class 3 congestive failture with a narrow QRS and a negative TDI  . Cholecystectomy    . Joint replacement  2009    Total Knee Replacement, Left  . Implantable cardioverter defibrillator (icd) generator change N/A 11/15/2012    Procedure: ICD GENERATOR CHANGE;  Surgeon: Deboraha Sprang, MD;  Location: [facility] CATH LAB;  Service: Cardiovascular;  Laterality: N/A;  . Lead revision N/A 11/15/2012    Procedure: LEAD REVISION;  Surgeon: Deboraha Sprang, MD;  Location: [facility] CATH LAB;  Service: Cardiovascular;  Laterality: N/A;  . Breast biopsy Right     2 neg bx   . Breast excisional biopsy Right 03/1995    + breast ca chemo and rad  . Breast excisional biopsy Right 2011    surgical bx neg  . Esophagogastroduodenoscopy (egd) with propofol N/A 06/25/2015    Procedure: ESOPHAGOGASTRODUODENOSCOPY (EGD) WITH PROPOFOL;  Surgeon: Manya Silvas, MD;  Location: [facility];  Service: Endoscopy;  Laterality: N/A;    FAMILY HISTORY Family History  Problem Relation Age of Onset  . Prostate cancer Father        ADVANCED DIRECTIVES:    HEALTH MAINTENANCE: Social History  Substance Use Topics  . Smoking status: Never Smoker   . Smokeless tobacco: Not on file  . Alcohol Use: No     Colonoscopy:  PAP:  Bone density:  Lipid panel:  No Known Allergies  Current Outpatient Prescriptions  Medication Sig Dispense Refill  . aspirin 81 MG tablet Take 81 mg  by mouth daily.    . Calcium Carbonate-Vitamin D (CALTRATE 600+D) 600-400 MG-UNIT per tablet Take 1 tablet by mouth 2 (two) times daily.      . carvedilol (COREG) 12.5 MG tablet Take 1.5 tablets (18.75 mg total) by mouth 2 (two) times daily with a meal. 90 tablet 10  . citalopram (CELEXA) 20 MG tablet Take 1 tablet by mouth daily. WILL BEGIN TO TAKE AFTER TITRATE DOWN OFF OF ZOLOFT    . docusate sodium  (COLACE) 100 MG capsule Take 100 mg by mouth 2 (two) times daily.      . furosemide (LASIX) 20 MG tablet Take 1 tablet by mouth daily.    Marland Kitchen gabapentin (NEURONTIN) 600 MG tablet Take 600 mg by mouth 2 (two) times daily.    Marland Kitchen glimepiride (AMARYL) 2 MG tablet Take 2 mg by mouth daily before breakfast.    . levothyroxine (SYNTHROID, LEVOTHROID) 112 MCG tablet     . lisinopril (PRINIVIL,ZESTRIL) 20 MG tablet Take 20 mg by mouth daily.      Marland Kitchen loratadine (CLARITIN) 10 MG tablet Take 10 mg by mouth daily.      . Multiple Vitamins-Minerals (CENTRUM SILVER) tablet Take 1 tablet by mouth daily.      . Multiple Vitamins-Minerals (OCUVITE PO) Take by mouth daily.    Marland Kitchen omeprazole (PRILOSEC) 20 MG capsule Take 20 mg by mouth daily.      . sertraline (ZOLOFT) 100 MG tablet Take 100 mg by mouth 2 (two) times daily. Reported on 06/25/2015    . SYMBICORT 160-4.5 MCG/ACT inhaler      No current facility-administered medications for this visit.    OBJECTIVE: Filed Vitals:   09/11/15 1503  BP: 131/70  Pulse: 77  Temp: 97.6 F (36.4 C)  Resp: 18     Body mass index is 42.5 kg/(m^2).    ECOG FS:0 - Asymptomatic  General: Well-developed, well-nourished, no acute distress. Eyes: anicteric sclera. Lungs: Clear to auscultation bilaterally. Heart: Regular rate and rhythm. No rubs, murmurs, or gallops. Abdomen: Soft, nontender, nondistended. No organomegaly noted, normoactive bowel sounds. Musculoskeletal: No edema, cyanosis, or clubbing. Neuro: Alert, answering all questions appropriately. Cranial nerves grossly intact. Skin: No rashes or petechiae noted. Psych: Normal affect.    LAB RESULTS:  Lab Results  Component Value Date   NA 141 11/08/2012   K 4.7 11/08/2012   CL 102 11/08/2012   CO2 21 11/08/2012   GLUCOSE 121* 11/08/2012   BUN 21 11/08/2012   CREATININE 1.10* 11/08/2012   CALCIUM 9.7 11/08/2012   GFRNONAA 54* 11/08/2012   GFRAA 62 11/08/2012    Lab Results  Component Value Date    WBC 5.0 09/11/2015   NEUTROABS 3.8 03/13/2015   HGB 11.7* 09/11/2015   HCT 34.9* 09/11/2015   MCV 86.0 09/11/2015   PLT 91* 09/11/2015     STUDIES: No results found.  ASSESSMENT: Autoimmune thrombocytopenia.  PLAN:    1. Thrombocytopenia: Patient's platelet antibodies are positive. She also has a positive ANA, but this is likely clinically insignificant. Finally, she also has mild splenomegaly which is also likely contributing.  Her platelets are essentially stable at 91. No intervention is needed. She does not require treatment at this time. Previously, the remainder of her laboratory work was either negative or within normal limits. Can consider repeating platelet antibodies, because these occasionally can be transient in nature.  Patient has been instructed that is also okay to continue her aspirin unless her platelet count falls below 50 or  if she has evidence of bleeding. Return to clinic in 6 months with repeat laboratory work and further evaluation.  2. Anemia: Mild, monitor.  Patient expressed understanding and was in agreement with this plan. She also understands that She can call clinic at any time with any questions, concerns, or complaints.    Lloyd Huger, MD   09/21/2015 7:41 AM

## 2015-09-24 ENCOUNTER — Ambulatory Visit (INDEPENDENT_AMBULATORY_CARE_PROVIDER_SITE_OTHER): Payer: Commercial Managed Care - HMO

## 2015-09-24 ENCOUNTER — Telehealth: Payer: Self-pay

## 2015-09-24 DIAGNOSIS — Z95810 Presence of automatic (implantable) cardiac defibrillator: Secondary | ICD-10-CM | POA: Diagnosis not present

## 2015-09-24 DIAGNOSIS — I5022 Chronic systolic (congestive) heart failure: Secondary | ICD-10-CM

## 2015-09-24 NOTE — Telephone Encounter (Signed)
Spoke with [place] in the office and requested a copy of labs to be faxed.  She stated she would send a message to the nurse to fax a copy.  Provided phone and fax number.

## 2015-09-24 NOTE — Progress Notes (Signed)
EPIC Encounter for ICM Monitoring  Patient Name: Katherine Rosales is a 65 y.o. female Date: 09/24/2015 Primary Care Physican: Dion Body, MD Primary Cardiologist: [name] Electrophysiologist: [name] Dry Weight: 211 lbs       In the past month, have you:  1. Gained more than 2 pounds in a day or more than 5 pounds in a week? Yes, gained 2-3 pounds in the last week.   2. Had changes in your medications (with verification of current medications)? No  3. Had more shortness of breath than is usual for you? No   4. Limited your activity because of shortness of breath? No   5. Not been able to sleep because of shortness of breath? No   6. Had increased swelling in your feet, ankles, legs or stomach area? Yes, she has bloating in her stomach for the last few days  7. Had symptoms of dehydration (dizziness, dry mouth, increased thirst, decreased urine output) No   8. Had changes in sodium restriction? No   9. Been compliant with medication? No, she reported her daughter fills her pill box and she has been taking Lasix 3 times a week instead of daily.  Reviewed current prescription and explained Lasix 40 mg is ordered daily.  Asked if any of her physicians has advised her to take it 3 times a week instead of daily and she stated no.  She will call her daughter so she can fill the box daily instead of 3 times a week.  ICM trend: 3 month view for 09/24/2015   ICM trend: 1 year view for 09/24/2015   Follow-up plan: ICM clinic phone appointment 10/28/2015.  1st ICM encounter.  FLUID LEVELS:  Optivol thoracic impedance decreased 09/23/2015 to 09/24/2015 suggesting fluid accumulation.    SYMPTOMS:   She reported weight gain of 2-3 pounds and stomach bloating.  Encouraged to call for any fluid symptoms.   EDUCATION:   She reported her brother passed away in the last week and she has been eating food that was brought to her home which has more sodium then she usually eats.  Advised to limit  sodium intake to < 2000 mg and fluid intake to 64 oz daily.     RECOMMENDATIONS:   Recommended she take Lasix 40 mg as prescribed which is daily.  Will call PCP to obtain latest labs.     [name], RN, CCM 09/24/2015 2:10 PM

## 2015-09-25 NOTE — Telephone Encounter (Signed)
Received faxed copy of lab results from PCP office.   CMP results: 08/25/2015 Sodium 142  Potassium 4.5, BUN 17, Creatinine 0.9, eGFR 63 12/05/2014 Sodium 141, Potassium 3.9, BUN 24, Creatinine 1.4, eGFR 38 02/21/2014 Sodium 141, Potassium 4.3, BUN 19, Creatinine 1.2, eGFR 45 10/23/2013 Sodium 141, Potassium 4.1, BUN 26, Creatinine 1.4, eGFR 38

## 2015-09-29 ENCOUNTER — Encounter: Payer: Self-pay | Admitting: Internal Medicine

## 2015-10-07 DIAGNOSIS — G4733 Obstructive sleep apnea (adult) (pediatric): Secondary | ICD-10-CM | POA: Diagnosis not present

## 2015-10-10 ENCOUNTER — Telehealth: Payer: Self-pay | Admitting: Internal Medicine

## 2015-10-10 ENCOUNTER — Other Ambulatory Visit: Payer: Self-pay | Admitting: *Deleted

## 2015-10-10 MED ORDER — CARVEDILOL 12.5 MG PO TABS: 18.7500 mg | ORAL_TABLET | Freq: Two times a day (BID) | ORAL | Status: DC

## 2015-10-10 NOTE — Telephone Encounter (Signed)
Requested Prescriptions   Signed Prescriptions Disp Refills  . carvedilol (COREG) 12.5 MG tablet 270 tablet 3    Sig: Take 1.5 tablets (18.75 mg total) by mouth 2 (two) times daily with a meal.    Authorizing Provider: Deboraha Sprang    Ordering User: Britt Bottom

## 2015-10-10 NOTE — Telephone Encounter (Signed)
°*  STAT* If patient is at the pharmacy, call can be transferred to refill team.   1. Which medications need to be refilled? (please list name of each medication and dose if known) Carvedilol 12.5 mg  1.5 2 x a day   2. Which pharmacy/location (including street and city if local pharmacy) is medication to be sent to? [facility]   3. Do they need a 30 day or 90 day supply? 90 day

## 2015-10-28 ENCOUNTER — Telehealth: Payer: Self-pay

## 2015-10-28 ENCOUNTER — Ambulatory Visit (INDEPENDENT_AMBULATORY_CARE_PROVIDER_SITE_OTHER): Payer: Commercial Managed Care - HMO | Admitting: *Deleted

## 2015-10-28 DIAGNOSIS — Z95810 Presence of automatic (implantable) cardiac defibrillator: Secondary | ICD-10-CM | POA: Diagnosis not present

## 2015-10-28 DIAGNOSIS — I429 Cardiomyopathy, unspecified: Secondary | ICD-10-CM

## 2015-10-28 DIAGNOSIS — I5022 Chronic systolic (congestive) heart failure: Secondary | ICD-10-CM

## 2015-10-28 NOTE — Progress Notes (Signed)
EPIC Encounter for ICM Monitoring  Patient Name: Katherine Rosales is a 65 y.o. female Date: 10/28/2015 Primary Care Physican: Dion Body, MD Primary Cardiologist: [name] Electrophysiologist: [name] Dry Weight: unknown  Attempted patient call and unable to reach.   Thoracic impedance abnormal from suggesting fluid accumulation 10/20/2015 to 10/24/2015 and returned to normal 10/24/2015.     ICM trend: 10/28/2015    Follow-up plan: ICM clinic phone appointment on 11/28/2015.  Copy of ICM check sent to device physician.   [name], RN 10/28/2015 8:14 AM

## 2015-10-28 NOTE — Telephone Encounter (Signed)
Remote ICM transmission received.  Attempted patient call on home and cell.  Left message for return call.

## 2015-10-28 NOTE — Progress Notes (Signed)
Patient returned call.  She denied any fluid symptoms.  She has had some fatigue during the time of decreased impedance.  She stated she is feeling better and thinks the fatigue may be related to heat and the stress in her personal life.  Advised to call she has any fluid symptoms.  Weight: 210 lbs No changes today.

## 2015-10-29 ENCOUNTER — Encounter: Payer: Self-pay | Admitting: Cardiology

## 2015-10-29 NOTE — Progress Notes (Signed)
Remote ICD transmission.   

## 2015-10-30 LAB — CUP PACEART REMOTE DEVICE CHECK
Battery Remaining Longevity: 113 mo
Battery Voltage: 3.01 V
Brady Statistic RV Percent Paced: 0.01 %
HIGH POWER IMPEDANCE MEASURED VALUE: 73 Ohm
Implantable Lead Implant Date: 20140813
Lead Channel Impedance Value: 342 Ohm
Lead Channel Pacing Threshold Pulse Width: 0.4 ms
Lead Channel Sensing Intrinsic Amplitude: 12.375 mV
Lead Channel Sensing Intrinsic Amplitude: 12.375 mV
Lead Channel Setting Pacing Amplitude: 2 V
MDC IDC LEAD LOCATION: 753860
MDC IDC LEAD MODEL: 6935
MDC IDC MSMT LEADCHNL RV IMPEDANCE VALUE: 399 Ohm
MDC IDC MSMT LEADCHNL RV PACING THRESHOLD AMPLITUDE: 0.625 V
MDC IDC SESS DTM: 20170725041706
MDC IDC SET LEADCHNL RV PACING PULSEWIDTH: 0.4 ms
MDC IDC SET LEADCHNL RV SENSING SENSITIVITY: 0.3 mV

## 2015-11-07 DIAGNOSIS — G4733 Obstructive sleep apnea (adult) (pediatric): Secondary | ICD-10-CM | POA: Diagnosis not present

## 2015-11-10 ENCOUNTER — Other Ambulatory Visit: Payer: Self-pay | Admitting: Family Medicine

## 2015-11-10 ENCOUNTER — Ambulatory Visit
Admission: RE | Admit: 2015-11-10 | Discharge: 2015-11-10 | Disposition: A | Discharge status: Home / Self Care | Payer: Commercial Managed Care - HMO | Source: Ambulatory Visit | Attending: Family Medicine | Admitting: Family Medicine

## 2015-11-10 DIAGNOSIS — R928 Other abnormal and inconclusive findings on diagnostic imaging of breast: Secondary | ICD-10-CM | POA: Insufficient documentation

## 2015-11-10 DIAGNOSIS — Z1231 Encounter for screening mammogram for malignant neoplasm of breast: Secondary | ICD-10-CM

## 2015-11-11 ENCOUNTER — Other Ambulatory Visit: Payer: Self-pay | Admitting: Family Medicine

## 2015-11-11 DIAGNOSIS — E1143 Type 2 diabetes mellitus with diabetic autonomic (poly)neuropathy: Secondary | ICD-10-CM | POA: Diagnosis not present

## 2015-11-11 DIAGNOSIS — F331 Major depressive disorder, recurrent, moderate: Secondary | ICD-10-CM | POA: Diagnosis not present

## 2015-11-11 DIAGNOSIS — N63 Unspecified lump in unspecified breast: Secondary | ICD-10-CM

## 2015-11-11 DIAGNOSIS — E039 Hypothyroidism, unspecified: Secondary | ICD-10-CM | POA: Diagnosis not present

## 2015-11-14 ENCOUNTER — Ambulatory Visit
Admission: RE | Admit: 2015-11-14 | Discharge: 2015-11-14 | Disposition: A | Discharge status: Home / Self Care | Payer: Commercial Managed Care - HMO | Source: Ambulatory Visit | Attending: Family Medicine | Admitting: Family Medicine

## 2015-11-14 DIAGNOSIS — N63 Unspecified lump in unspecified breast: Secondary | ICD-10-CM

## 2015-11-14 DIAGNOSIS — N6489 Other specified disorders of breast: Secondary | ICD-10-CM | POA: Diagnosis not present

## 2015-11-14 DIAGNOSIS — R928 Other abnormal and inconclusive findings on diagnostic imaging of breast: Secondary | ICD-10-CM | POA: Diagnosis not present

## 2015-11-17 ENCOUNTER — Other Ambulatory Visit: Payer: Self-pay | Admitting: Family Medicine

## 2015-11-17 DIAGNOSIS — N6310 Unspecified lump in the right breast, unspecified quadrant: Secondary | ICD-10-CM

## 2015-11-24 ENCOUNTER — Ambulatory Visit
Admission: RE | Admit: 2015-11-24 | Discharge: 2015-11-24 | Disposition: A | Discharge status: Home / Self Care | Payer: Commercial Managed Care - HMO | Source: Ambulatory Visit | Attending: Family Medicine | Admitting: Family Medicine

## 2015-11-24 DIAGNOSIS — N63 Unspecified lump in breast: Secondary | ICD-10-CM | POA: Insufficient documentation

## 2015-11-24 DIAGNOSIS — N641 Fat necrosis of breast: Secondary | ICD-10-CM | POA: Insufficient documentation

## 2015-11-24 DIAGNOSIS — N6031 Fibrosclerosis of right breast: Secondary | ICD-10-CM | POA: Insufficient documentation

## 2015-11-24 DIAGNOSIS — N61 Mastitis without abscess: Secondary | ICD-10-CM | POA: Insufficient documentation

## 2015-11-24 DIAGNOSIS — N6310 Unspecified lump in the right breast, unspecified quadrant: Secondary | ICD-10-CM

## 2015-11-25 LAB — SURGICAL PATHOLOGY

## 2015-11-26 HISTORY — PX: BREAST BIOPSY: SHX20

## 2015-11-28 ENCOUNTER — Ambulatory Visit (INDEPENDENT_AMBULATORY_CARE_PROVIDER_SITE_OTHER): Payer: Commercial Managed Care - HMO

## 2015-11-28 DIAGNOSIS — I5022 Chronic systolic (congestive) heart failure: Secondary | ICD-10-CM | POA: Diagnosis not present

## 2015-11-28 DIAGNOSIS — Z95810 Presence of automatic (implantable) cardiac defibrillator: Secondary | ICD-10-CM | POA: Diagnosis not present

## 2015-11-28 NOTE — Progress Notes (Signed)
EPIC Encounter for ICM Monitoring  Patient Name: Katherine Rosales is a 65 y.o. female Date: 11/28/2015 Primary Care Physican: Dion Body, MD Primary Cardiologist: [name] Electrophysiologist: [name] Dry Weight: 215 lb      Heart Failure questions reviewed, pt asymptomatic   Thoracic impedance normal.  Recommendations: No changes.  Low sodium diet education provided.    Follow-up plan: ICM clinic phone appointment on 12/29/2015.  Copy of ICM check sent to device physician.   ICM trend: 11/28/2015       [name], RN 11/28/2015 8:27 AM

## 2015-12-08 DIAGNOSIS — G4733 Obstructive sleep apnea (adult) (pediatric): Secondary | ICD-10-CM | POA: Diagnosis not present

## 2015-12-10 DIAGNOSIS — E119 Type 2 diabetes mellitus without complications: Secondary | ICD-10-CM | POA: Diagnosis not present

## 2015-12-10 DIAGNOSIS — K7469 Other cirrhosis of liver: Secondary | ICD-10-CM | POA: Diagnosis not present

## 2015-12-10 DIAGNOSIS — I10 Essential (primary) hypertension: Secondary | ICD-10-CM | POA: Diagnosis not present

## 2015-12-10 DIAGNOSIS — I428 Other cardiomyopathies: Secondary | ICD-10-CM | POA: Diagnosis not present

## 2015-12-10 DIAGNOSIS — Z9221 Personal history of antineoplastic chemotherapy: Secondary | ICD-10-CM | POA: Diagnosis not present

## 2015-12-10 DIAGNOSIS — Z6841 Body Mass Index (BMI) 40.0 and over, adult: Secondary | ICD-10-CM | POA: Diagnosis not present

## 2015-12-10 DIAGNOSIS — R161 Splenomegaly, not elsewhere classified: Secondary | ICD-10-CM | POA: Diagnosis not present

## 2015-12-10 DIAGNOSIS — K7460 Unspecified cirrhosis of liver: Secondary | ICD-10-CM | POA: Diagnosis not present

## 2015-12-10 DIAGNOSIS — E669 Obesity, unspecified: Secondary | ICD-10-CM | POA: Diagnosis not present

## 2015-12-10 DIAGNOSIS — G4733 Obstructive sleep apnea (adult) (pediatric): Secondary | ICD-10-CM | POA: Diagnosis not present

## 2015-12-24 DIAGNOSIS — K7581 Nonalcoholic steatohepatitis (NASH): Secondary | ICD-10-CM | POA: Diagnosis not present

## 2015-12-24 DIAGNOSIS — K7460 Unspecified cirrhosis of liver: Secondary | ICD-10-CM | POA: Diagnosis not present

## 2015-12-29 ENCOUNTER — Ambulatory Visit (INDEPENDENT_AMBULATORY_CARE_PROVIDER_SITE_OTHER): Payer: Commercial Managed Care - HMO

## 2015-12-29 DIAGNOSIS — Z95810 Presence of automatic (implantable) cardiac defibrillator: Secondary | ICD-10-CM | POA: Diagnosis not present

## 2015-12-29 DIAGNOSIS — I5022 Chronic systolic (congestive) heart failure: Secondary | ICD-10-CM | POA: Diagnosis not present

## 2015-12-29 NOTE — Progress Notes (Signed)
EPIC Encounter for ICM Monitoring  Patient Name: Katherine Rosales is a 65 y.o. female Date: 12/29/2015 Primary Care Physican: Dion Body, MD Primary [place] Electrophysiologist: Faustino Congress Weight: unknown      Heart Failure questions reviewed, pt asymptomatic   Thoracic impedance normal   Recommendations: No changes.  Low sodium diet education provided.    Follow-up plan: ICM clinic phone appointment on 01/29/2016.  Copy of ICM check sent to primary cardiologist and device physician.   ICM trend: 12/29/2015       [name], RN 12/29/2015 4:03 PM

## 2016-01-07 DIAGNOSIS — G4733 Obstructive sleep apnea (adult) (pediatric): Secondary | ICD-10-CM | POA: Diagnosis not present

## 2016-01-29 ENCOUNTER — Ambulatory Visit (INDEPENDENT_AMBULATORY_CARE_PROVIDER_SITE_OTHER): Payer: Commercial Managed Care - HMO | Admitting: *Deleted

## 2016-01-29 DIAGNOSIS — Z95810 Presence of automatic (implantable) cardiac defibrillator: Secondary | ICD-10-CM

## 2016-01-29 DIAGNOSIS — I5022 Chronic systolic (congestive) heart failure: Secondary | ICD-10-CM

## 2016-01-29 DIAGNOSIS — I429 Cardiomyopathy, unspecified: Secondary | ICD-10-CM

## 2016-01-29 NOTE — Progress Notes (Signed)
Remote ICD transmission.   

## 2016-01-30 ENCOUNTER — Telehealth: Payer: Self-pay

## 2016-01-30 NOTE — Telephone Encounter (Signed)
Remote ICM transmission received.  Attempted patient call and left message

## 2016-01-30 NOTE — Progress Notes (Signed)
EPIC Encounter for ICM Monitoring  Patient Name: Katherine Rosales is a 65 y.o. female Date: 01/30/2016 Primary Care Physican: Dion Body, MD Primary [place] Electrophysiologist: Faustino Congress Weight:    unknown             Attempted ICM call and unable to reach.  Transmission reviewed.   Thoracic impedance slightly below baseline suggesting fluid accumulation.  Follow-up plan: ICM clinic phone appointment on 03/01/2016.  Copy of ICM check sent to device physician.   ICM trend: 01/29/2016       [name], RN 01/30/2016 1:49 PM

## 2016-01-30 NOTE — Progress Notes (Signed)
Patient returned call.  She stated she has been on vacation and eating salty foods.  Reviewed transmission.  She feels increase in SOB and thinks she has had a weight gain but has really weighed.  Advised to weigh daily to monitor for fluid weight.  She stated she has had some labs drawn at PCP office and all the results were normal.    Recommendations:  Increase Furosemide 20 mg to 2 tablets daily x 3 days and then return to 1 tablet daily after 3rd day.  She verbalized understanding.    Will recheck fluid levels and remote transmission scheduled for 02/04/2016

## 2016-02-04 ENCOUNTER — Ambulatory Visit (INDEPENDENT_AMBULATORY_CARE_PROVIDER_SITE_OTHER): Payer: Commercial Managed Care - HMO

## 2016-02-04 DIAGNOSIS — Z95810 Presence of automatic (implantable) cardiac defibrillator: Secondary | ICD-10-CM

## 2016-02-04 DIAGNOSIS — I5022 Chronic systolic (congestive) heart failure: Secondary | ICD-10-CM

## 2016-02-04 NOTE — Progress Notes (Signed)
EPIC Encounter for ICM Monitoring  Patient Name: Katherine Rosales is a 65 y.o. female Date: 02/04/2016 Primary Care Physican: Dion Body, MD Primary [place] Electrophysiologist: Faustino Congress Weight: 227 lb         Heart Failure questions reviewed, pt asymptomatic   Thoracic impedance returned to normal after taking extra Furosemide x 3 days.   Recommendations: No changes.  Advised to limit salt intake to 2000 mg daily.  Encouraged to call for fluid symptoms.    Follow-up plan: ICM clinic phone appointment on 03/01/2016.  Copy of ICM check sent to device physician.   ICM trend: 02/04/2016       [name], RN 02/04/2016 2:30 PM

## 2016-02-06 ENCOUNTER — Encounter: Payer: Self-pay | Admitting: Cardiology

## 2016-02-26 LAB — CUP PACEART REMOTE DEVICE CHECK
Battery Remaining Longevity: 110 mo
HIGH POWER IMPEDANCE MEASURED VALUE: 82 Ohm
Implantable Lead Implant Date: 20140813
Implantable Pulse Generator Implant Date: 20140813
Lead Channel Impedance Value: 323 Ohm
Lead Channel Pacing Threshold Amplitude: 0.75 V
Lead Channel Pacing Threshold Pulse Width: 0.4 ms
Lead Channel Sensing Intrinsic Amplitude: 11.625 mV
Lead Channel Sensing Intrinsic Amplitude: 11.625 mV
Lead Channel Setting Pacing Pulse Width: 0.4 ms
Lead Channel Setting Sensing Sensitivity: 0.3 mV
MDC IDC LEAD LOCATION: 753860
MDC IDC LEAD MODEL: 6935
MDC IDC MSMT BATTERY VOLTAGE: 3.01 V
MDC IDC MSMT LEADCHNL RV IMPEDANCE VALUE: 399 Ohm
MDC IDC SESS DTM: 20171026092608
MDC IDC SET LEADCHNL RV PACING AMPLITUDE: 2 V
MDC IDC STAT BRADY RV PERCENT PACED: 0.01 %

## 2016-02-27 ENCOUNTER — Other Ambulatory Visit: Payer: Self-pay | Admitting: Internal Medicine

## 2016-03-01 ENCOUNTER — Telehealth: Payer: Self-pay

## 2016-03-01 ENCOUNTER — Ambulatory Visit (INDEPENDENT_AMBULATORY_CARE_PROVIDER_SITE_OTHER): Payer: Commercial Managed Care - HMO

## 2016-03-01 DIAGNOSIS — I5022 Chronic systolic (congestive) heart failure: Secondary | ICD-10-CM

## 2016-03-01 DIAGNOSIS — Z95810 Presence of automatic (implantable) cardiac defibrillator: Secondary | ICD-10-CM | POA: Diagnosis not present

## 2016-03-01 NOTE — Progress Notes (Signed)
Patient returned call.  She stated she is doing well and denied any fluid symptoms.  Does not weigh at home.  Advised to call for any fluid symptoms.  No changes today.  Next ICM remote transmission 04/01/2016

## 2016-03-01 NOTE — Telephone Encounter (Signed)
Remote ICM transmission received.  Attempted patient call and left message to return call.   

## 2016-03-01 NOTE — Progress Notes (Signed)
EPIC Encounter for ICM Monitoring  Patient Name: Katherine Rosales is a 65 y.o. female Date: 03/01/2016 Primary Care Physican: Dion Body, MD Primary [place] Electrophysiologist: Faustino Congress Weight:    unknown       Attempted ICM call and unable to reach.  Left message to return call.  Transmission reviewed.   Thoracic impedance normal .  Was abnormal suggesting fluid accumulation from 11/3 to 11/20.  Recommendations:   NONE - unable to reach.    Follow-up plan: ICM clinic phone appointment on 04/01/2016.  Copy of ICM check sent to device physician.   ICM trend: 03/01/2016       [name], RN 03/01/2016 4:04 PM

## 2016-03-14 DIAGNOSIS — D693 Immune thrombocytopenic purpura: Secondary | ICD-10-CM | POA: Insufficient documentation

## 2016-03-14 NOTE — Progress Notes (Signed)
[place]  Telephone:(336) [phone] Fax:(336) [phone]  ID: Katherine Rosales OB: 1950-11-25  MR#: [id number]  URK#:[id number]  Patient Care Team: Dion Body, MD as PCP - General (Family Medicine)  CHIEF COMPLAINT: Autoimmune thrombocytopenia.  INTERVAL HISTORY: Patient returns to clinic today for repeat laboratory work and further evaluation. She continues to feel well and is asymptomatic. She has no neurologic complaints. She denies any recent fevers or illnesses.  She has a good appetite and denies weight loss. She denies any chest pain or shortness of breath. She denies any easy bleeding or bruising. She has no nausea, vomiting, constipation, or diarrhea. She has no urinary complaints. Patient offers no specific complaints today.   REVIEW OF SYSTEMS:   Review of Systems  Constitutional: Negative.  Negative for fever, malaise/fatigue and weight loss.  Respiratory: Negative.  Negative for cough and shortness of breath.   Cardiovascular: Negative.  Negative for chest pain.  Gastrointestinal: Negative.  Negative for blood in stool and melena.  Genitourinary: Negative.   Musculoskeletal: Negative.   Neurological: Negative.  Negative for weakness.  Endo/Heme/Allergies: Does not bruise/bleed easily.  Psychiatric/Behavioral: Negative.     As per HPI. Otherwise, a complete review of systems is negative.  PAST MEDICAL HISTORY: Past Medical History:  Diagnosis Date  . 8315 Lead-Medtronic   . Automatic implantable cardioverter-defibrillator in situ   . Breast cancer ([place]) 03/1995  . Cardiomyopathy, nonischemic ([place])   . Diabetes mellitus    Type II Diabetes  . Hypertension   . ICD (implantable cardiac defibrillator), singleMedtronic   . Spleen enlarged   . Systolic heart failure, chronic ([place])     PAST SURGICAL HISTORY: Past Surgical History:  Procedure Laterality Date  . BREAST BIOPSY Right    2 neg bx   . BREAST EXCISIONAL BIOPSY Right 03/1995   +  breast ca chemo and rad  . BREAST EXCISIONAL BIOPSY Right 2011   surgical bx neg  . BREAST LUMPECTOMY    . CHOLECYSTECTOMY    . ESOPHAGOGASTRODUODENOSCOPY (EGD) WITH PROPOFOL N/A 06/25/2015   Procedure: ESOPHAGOGASTRODUODENOSCOPY (EGD) WITH PROPOFOL;  Surgeon: Manya Silvas, MD;  Location: [facility] ENDOSCOPY;  Service: Endoscopy;  Laterality: N/A;  . IMPLANTABLE CARDIOVERTER DEFIBRILLATOR (ICD) GENERATOR CHANGE N/A 11/15/2012   Procedure: ICD GENERATOR CHANGE;  Surgeon: Deboraha Sprang, MD;  Location: [facility] CATH LAB;  Service: Cardiovascular;  Laterality: N/A;  . JOINT REPLACEMENT  2009   Total Knee Replacement, Left  . LEAD REVISION N/A 11/15/2012   Procedure: LEAD REVISION;  Surgeon: Deboraha Sprang, MD;  Location: [facility] CATH LAB;  Service: Cardiovascular;  Laterality: N/A;  . PACEMAKER INSERTION  06/30/2005   Medtronic Virtuoso VR ICD - Nonischemic cardiomyopathy potentially related to adriamycin, class 3 congestive failture with a narrow QRS and a negative TDI  . TONSILLECTOMY    . TUBAL LIGATION      FAMILY HISTORY Family History  Problem Relation Age of Onset  . Prostate cancer Father        ADVANCED DIRECTIVES:    HEALTH MAINTENANCE: Social History  Substance Use Topics  . Smoking status: Never Smoker  . Smokeless tobacco: Not on file  . Alcohol use No     Colonoscopy:  PAP:  Bone density:  Lipid panel:  No Known Allergies  Current Outpatient Prescriptions  Medication Sig Dispense Refill  . aspirin 81 MG tablet Take 81 mg by mouth daily.    . Calcium Carbonate-Vitamin D (CALTRATE 600+D) 600-400 MG-UNIT per tablet Take 1 tablet  by mouth 2 (two) times daily.      . carvedilol (COREG) 12.5 MG tablet TAKE 1 & 1/2 TABLET BY MOUTH TWICE DAILY WITH FOOD 84 tablet 3  . citalopram (CELEXA) 20 MG tablet Take 1 tablet by mouth daily. WILL BEGIN TO TAKE AFTER TITRATE DOWN OFF OF ZOLOFT    . docusate sodium (COLACE) 100 MG capsule Take 100 mg by mouth 2 (two) times daily.      .  furosemide (LASIX) 20 MG tablet Take 1 tablet by mouth daily.    Marland Kitchen gabapentin (NEURONTIN) 600 MG tablet Take 600 mg by mouth 2 (two) times daily.    Marland Kitchen glimepiride (AMARYL) 2 MG tablet Take 2 mg by mouth daily before breakfast.    . levothyroxine (SYNTHROID, LEVOTHROID) 112 MCG tablet     . lisinopril (PRINIVIL,ZESTRIL) 20 MG tablet Take 20 mg by mouth daily.      Marland Kitchen loratadine (CLARITIN) 10 MG tablet Take 10 mg by mouth daily.      . Multiple Vitamins-Minerals (CENTRUM SILVER) tablet Take 1 tablet by mouth daily.      . Multiple Vitamins-Minerals (OCUVITE PO) Take by mouth daily.    Marland Kitchen omeprazole (PRILOSEC) 20 MG capsule Take 20 mg by mouth daily.      . sertraline (ZOLOFT) 100 MG tablet Take 100 mg by mouth 2 (two) times daily. Reported on 06/25/2015    . SYMBICORT 160-4.5 MCG/ACT inhaler      No current facility-administered medications for this visit.     OBJECTIVE: Vitals:   03/15/16 1512  BP: 121/64  Pulse: 73  Resp: 18  Temp: 98.7 F (37.1 C)     Body mass index is 45.51 kg/m.    ECOG FS:0 - Asymptomatic  General: Well-developed, well-nourished, no acute distress. Eyes: anicteric sclera. Lungs: Clear to auscultation bilaterally. Heart: Regular rate and rhythm. No rubs, murmurs, or gallops. Abdomen: Soft, nontender, nondistended. No organomegaly noted, normoactive bowel sounds. Musculoskeletal: No edema, cyanosis, or clubbing. Neuro: Alert, answering all questions appropriately. Cranial nerves grossly intact. Skin: No rashes or petechiae noted. Psych: Normal affect.    LAB RESULTS:  Lab Results  Component Value Date   NA 141 11/08/2012   K 4.7 11/08/2012   CL 102 11/08/2012   CO2 21 11/08/2012   GLUCOSE 121 (H) 11/08/2012   BUN 21 11/08/2012   CREATININE 1.10 (H) 11/08/2012   CALCIUM 9.7 11/08/2012   GFRNONAA 54 (L) 11/08/2012   GFRAA 62 11/08/2012    Lab Results  Component Value Date   WBC 4.9 03/15/2016   NEUTROABS 3.3 03/15/2016   HGB 12.0 03/15/2016    HCT 36.1 03/15/2016   MCV 86.1 03/15/2016   PLT 105 (L) 03/15/2016     STUDIES: No results found.  ASSESSMENT: Autoimmune thrombocytopenia.  PLAN:    1. Autoimmune thrombocytopenia: Patient's platelet antibodies are positive. She also has a positive ANA, but this is likely clinically insignificant. Finally, she also has mild splenomegaly which is also likely contributing.  Her platelets are essentially stable at 105. No intervention is needed. She does not require treatment or a bone marrow biopsy at this time. Previously, the remainder of her laboratory work was either negative or within normal limits. Will repeat platelet antibodies and ANA at next clinic visit. Patient has been instructed that is also okay to continue her aspirin unless her platelet count falls below 50 or if she has evidence of bleeding. Return to clinic in 6 months with repeat laboratory work and  further evaluation. If everything remains stable, can consider discharging patient from clinic. 2. Anemia: Resolved.   Patient expressed understanding and was in agreement with this plan. She also understands that She can call clinic at any time with any questions, concerns, or complaints.    Lloyd Huger, MD   03/17/2016 9:54 AM

## 2016-03-15 ENCOUNTER — Other Ambulatory Visit: Payer: Self-pay

## 2016-03-15 ENCOUNTER — Inpatient Hospital Stay: Payer: Commercial Managed Care - HMO | Attending: Oncology

## 2016-03-15 ENCOUNTER — Inpatient Hospital Stay (HOSPITAL_BASED_OUTPATIENT_CLINIC_OR_DEPARTMENT_OTHER): Payer: Commercial Managed Care - HMO | Admitting: Oncology

## 2016-03-15 VITALS — BP 121/64 | HR 73 | Temp 98.7°F | Resp 18 | Wt 225.3 lb

## 2016-03-15 DIAGNOSIS — Z79899 Other long term (current) drug therapy: Secondary | ICD-10-CM

## 2016-03-15 DIAGNOSIS — Z8042 Family history of malignant neoplasm of prostate: Secondary | ICD-10-CM | POA: Insufficient documentation

## 2016-03-15 DIAGNOSIS — I10 Essential (primary) hypertension: Secondary | ICD-10-CM | POA: Diagnosis not present

## 2016-03-15 DIAGNOSIS — D693 Immune thrombocytopenic purpura: Secondary | ICD-10-CM

## 2016-03-15 DIAGNOSIS — I429 Cardiomyopathy, unspecified: Secondary | ICD-10-CM | POA: Insufficient documentation

## 2016-03-15 DIAGNOSIS — R161 Splenomegaly, not elsewhere classified: Secondary | ICD-10-CM

## 2016-03-15 DIAGNOSIS — Z7982 Long term (current) use of aspirin: Secondary | ICD-10-CM | POA: Insufficient documentation

## 2016-03-15 DIAGNOSIS — E119 Type 2 diabetes mellitus without complications: Secondary | ICD-10-CM | POA: Insufficient documentation

## 2016-03-15 DIAGNOSIS — Z9049 Acquired absence of other specified parts of digestive tract: Secondary | ICD-10-CM

## 2016-03-15 DIAGNOSIS — Z7984 Long term (current) use of oral hypoglycemic drugs: Secondary | ICD-10-CM | POA: Insufficient documentation

## 2016-03-15 DIAGNOSIS — Z95810 Presence of automatic (implantable) cardiac defibrillator: Secondary | ICD-10-CM

## 2016-03-15 DIAGNOSIS — I5022 Chronic systolic (congestive) heart failure: Secondary | ICD-10-CM | POA: Insufficient documentation

## 2016-03-15 DIAGNOSIS — D696 Thrombocytopenia, unspecified: Secondary | ICD-10-CM

## 2016-03-15 LAB — CBC WITH DIFFERENTIAL/PLATELET
BASOS ABS: 0 10*3/uL (ref 0–0.1)
BASOS PCT: 1 %
EOS ABS: 0.1 10*3/uL (ref 0–0.7)
EOS PCT: 3 %
HCT: 36.1 % (ref 35.0–47.0)
Hemoglobin: 12 g/dL (ref 12.0–16.0)
LYMPHS ABS: 1 10*3/uL (ref 1.0–3.6)
Lymphocytes Relative: 20 %
MCH: 28.7 pg (ref 26.0–34.0)
MCHC: 33.3 g/dL (ref 32.0–36.0)
MCV: 86.1 fL (ref 80.0–100.0)
Monocytes Absolute: 0.5 10*3/uL (ref 0.2–0.9)
Monocytes Relative: 10 %
Neutro Abs: 3.3 10*3/uL (ref 1.4–6.5)
Neutrophils Relative %: 66 %
PLATELETS: 105 10*3/uL — AB (ref 150–440)
RBC: 4.19 MIL/uL (ref 3.80–5.20)
RDW: 13.8 % (ref 11.5–14.5)
WBC: 4.9 10*3/uL (ref 3.6–11.0)

## 2016-03-15 NOTE — Progress Notes (Signed)
Offers no complaints.

## 2016-04-01 ENCOUNTER — Telehealth: Payer: Self-pay | Admitting: Cardiology

## 2016-04-01 ENCOUNTER — Ambulatory Visit (INDEPENDENT_AMBULATORY_CARE_PROVIDER_SITE_OTHER): Payer: Commercial Managed Care - HMO

## 2016-04-01 DIAGNOSIS — Z95810 Presence of automatic (implantable) cardiac defibrillator: Secondary | ICD-10-CM

## 2016-04-01 DIAGNOSIS — I5022 Chronic systolic (congestive) heart failure: Secondary | ICD-10-CM

## 2016-04-01 NOTE — Telephone Encounter (Signed)
Spoke with pt and reminded pt of remote transmission that is due today. Pt verbalized understanding.   

## 2016-04-02 NOTE — Progress Notes (Signed)
EPIC Encounter for ICM Monitoring  Patient Name: AZARI JANSSENS is a 65 y.o. female Date: 04/02/2016 Primary Care Physican: Dion Body, MD Primary [place] Electrophysiologist: Faustino Congress Weight:unknown             Heart Failure questions reviewed, pt asymptomatic   Thoracic impedance abnormal suggesting fluid accumulation.  Labs: 11/11/2015 Creatinine 1.1, BUN 15, Potassium 4.4, Sodium 143, EGFR 50 - Care Everywhere 08/25/2015 Creatinine 0.9, BUN 17, Potassium 4.5, Sodium 142, EGFR 63  - Care Everywhere 04/09/2015 Creatinine 1.4, BUN 24, Potassium 3.9, Sodium 141, EGFR 38  - Care Everywhere  Recommendations:   Increase Furosemide 20 mg to 2 tablets every morning x 2 days and then return to 1 tablet daily.   Provided direct ICM number.   Follow-up plan: ICM clinic phone appointment on 04/08/2016 to recheck fluid levels.  Office appt with Dr [name] 04/27/2016.    Copy of ICM check sent to primary cardiologist and device physician.   3 month ICM trend : 04/01/2016   1 Year ICM trend:      [name], RN 04/02/2016 7:59 AM

## 2016-04-08 ENCOUNTER — Ambulatory Visit (INDEPENDENT_AMBULATORY_CARE_PROVIDER_SITE_OTHER): Payer: Commercial Managed Care - HMO

## 2016-04-08 DIAGNOSIS — Z95810 Presence of automatic (implantable) cardiac defibrillator: Secondary | ICD-10-CM

## 2016-04-08 DIAGNOSIS — I5022 Chronic systolic (congestive) heart failure: Secondary | ICD-10-CM

## 2016-04-08 NOTE — Progress Notes (Signed)
EPIC Encounter for ICM Monitoring  Patient Name: Katherine Rosales is a 66 y.o. female Date: 04/08/2016 Primary Care Physican: Katherine Body, MD Primary [place] Electrophysiologist: Katherine Rosales Weight:unknown   Attempted ICM call and unable to reach.  Left message to return call.  Transmission reviewed.   Thoracic impedance returned to normal after taking extra Furosemide x 2 days.  Labs: 11/11/2015 Creatinine 1.1, BUN 15, Potassium 4.4, Sodium 143, EGFR 50 - Care Everywhere 08/25/2015 Creatinine 0.9, BUN 17, Potassium 4.5, Sodium 142, EGFR 63  - Care Everywhere 04/09/2015 Creatinine 1.4, BUN 24, Potassium 3.9, Sodium 141, EGFR 38  - Care Everywhere   Recommendations:  Provided ICM direct phone number to return call    Follow-up plan: ICM clinic phone appointment on 05/03/2016.  Copy of ICM check sent to device physician.   3 month ICM trend : 04/08/2016   1 Year ICM trend:      Katherine Billings, RN 04/08/2016 11:55 AM

## 2016-04-13 DIAGNOSIS — K7469 Other cirrhosis of liver: Secondary | ICD-10-CM | POA: Diagnosis not present

## 2016-04-13 DIAGNOSIS — R162 Hepatomegaly with splenomegaly, not elsewhere classified: Secondary | ICD-10-CM | POA: Diagnosis not present

## 2016-04-27 ENCOUNTER — Encounter: Payer: Self-pay | Admitting: Internal Medicine

## 2016-04-27 ENCOUNTER — Ambulatory Visit (INDEPENDENT_AMBULATORY_CARE_PROVIDER_SITE_OTHER): Payer: Commercial Managed Care - HMO | Admitting: Internal Medicine

## 2016-04-27 VITALS — BP 112/80 | HR 66 | Ht 59.0 in | Wt 227.5 lb

## 2016-04-27 DIAGNOSIS — I428 Other cardiomyopathies: Secondary | ICD-10-CM | POA: Diagnosis not present

## 2016-04-27 DIAGNOSIS — Z95810 Presence of automatic (implantable) cardiac defibrillator: Secondary | ICD-10-CM | POA: Diagnosis not present

## 2016-04-27 DIAGNOSIS — I5022 Chronic systolic (congestive) heart failure: Secondary | ICD-10-CM

## 2016-04-27 LAB — CUP PACEART INCLINIC DEVICE CHECK
Battery Remaining Longevity: 107 mo
Battery Voltage: 3.01 V
HIGH POWER IMPEDANCE MEASURED VALUE: 92 Ohm
Implantable Lead Location: 753860
Implantable Lead Model: 6935
Implantable Pulse Generator Implant Date: 20140813
Lead Channel Impedance Value: 380 Ohm
Lead Channel Pacing Threshold Amplitude: 0.75 V
Lead Channel Setting Pacing Pulse Width: 0.4 ms
Lead Channel Setting Sensing Sensitivity: 0.3 mV
MDC IDC LEAD IMPLANT DT: 20140813
MDC IDC MSMT LEADCHNL RV IMPEDANCE VALUE: 323 Ohm
MDC IDC MSMT LEADCHNL RV PACING THRESHOLD PULSEWIDTH: 0.4 ms
MDC IDC MSMT LEADCHNL RV SENSING INTR AMPL: 14.875 mV
MDC IDC SESS DTM: 20180123161635
MDC IDC SET LEADCHNL RV PACING AMPLITUDE: 2 V
MDC IDC STAT BRADY RV PERCENT PACED: 0.18 %

## 2016-04-27 NOTE — Progress Notes (Signed)
Patient Care Team: Dion Body, MD as PCP - General (Family Medicine)   HPI  Katherine Rosales is a 66 y.o. female Seen in followup for ICD implanted for nonischemic cardiac myopathy and primary prevention. She has chronic systolic failure. She underwent generator replacement in 8/14. She is a Medtronic device.  The patient denies chest pain, shortness of breath is stable and chronic  NO nocturnal dyspnea, orthopnea or peripheral edema.  There have been no palpitations, lightheadedness or syncope.     She has known history of chronic hepatitis. Followed locally and at [facility]; she is not felt to be a candidate for transplant  Intercurrently she has been diagnosed with autoimmune thrombocytopenia; these notes were reviewed  She has a history of sleep apnea. He was treated with BiPAP years ago. She has been noncompliant. Past Medical History:  Diagnosis Date  . 9470 Lead-Medtronic   . Automatic implantable cardioverter-defibrillator in situ   . Breast cancer ([place]) 03/1995  . Cardiomyopathy, nonischemic ([place])   . Diabetes mellitus    Type II Diabetes  . Hypertension   . ICD (implantable cardiac defibrillator), singleMedtronic   . Spleen enlarged   . Systolic heart failure, chronic ([place])     Past Surgical History:  Procedure Laterality Date  . BREAST BIOPSY Right    2 neg bx   . BREAST EXCISIONAL BIOPSY Right 03/1995   + breast ca chemo and rad  . BREAST EXCISIONAL BIOPSY Right 2011   surgical bx neg  . BREAST LUMPECTOMY    . CHOLECYSTECTOMY    . ESOPHAGOGASTRODUODENOSCOPY (EGD) WITH PROPOFOL N/A 06/25/2015   Procedure: ESOPHAGOGASTRODUODENOSCOPY (EGD) WITH PROPOFOL;  Surgeon: Manya Silvas, MD;  [facility];  Service: Endoscopy;  Laterality: N/A;  . IMPLANTABLE CARDIOVERTER DEFIBRILLATOR (ICD) GENERATOR CHANGE N/A 11/15/2012   Procedure: ICD GENERATOR CHANGE;  Surgeon: Deboraha Sprang, MD;  Location: [facility] CATH LAB;  Service: Cardiovascular;  Laterality: N/A;  .  JOINT REPLACEMENT  2009   Total Knee Replacement, Left  . LEAD REVISION N/A 11/15/2012   Procedure: LEAD REVISION;  Surgeon: Deboraha Sprang, MD;  Location: [facility] CATH LAB;  Service: Cardiovascular;  Laterality: N/A;  . PACEMAKER INSERTION  06/30/2005   Medtronic Virtuoso VR ICD - Nonischemic cardiomyopathy potentially related to adriamycin, class 3 congestive failture with a narrow QRS and a negative TDI  . TONSILLECTOMY    . TUBAL LIGATION      Current Outpatient Prescriptions  Medication Sig Dispense Refill  . aspirin 81 MG tablet Take 81 mg by mouth daily.    . Calcium Carbonate-Vitamin D (CALTRATE 600+D) 600-400 MG-UNIT per tablet Take 1 tablet by mouth 2 (two) times daily.      . carvedilol (COREG) 12.5 MG tablet TAKE 1 & 1/2 TABLET BY MOUTH TWICE DAILY WITH FOOD 84 tablet 3  . citalopram (CELEXA) 20 MG tablet Take 1 tablet by mouth daily. WILL BEGIN TO TAKE AFTER TITRATE DOWN OFF OF ZOLOFT    . docusate sodium (COLACE) 100 MG capsule Take 100 mg by mouth 2 (two) times daily.      . furosemide (LASIX) 20 MG tablet Take 1 tablet by mouth daily.    Marland Kitchen gabapentin (NEURONTIN) 600 MG tablet Take 600 mg by mouth 2 (two) times daily.    Marland Kitchen glimepiride (AMARYL) 2 MG tablet Take 2 mg by mouth daily before breakfast.    . levothyroxine (SYNTHROID, LEVOTHROID) 112 MCG tablet     . lisinopril (PRINIVIL,ZESTRIL) 20 MG tablet Take 20  mg by mouth daily.      Marland Kitchen loratadine (CLARITIN) 10 MG tablet Take 10 mg by mouth daily.      . Multiple Vitamins-Minerals (CENTRUM SILVER) tablet Take 1 tablet by mouth daily.      . Multiple Vitamins-Minerals (OCUVITE PO) Take by mouth daily.    Marland Kitchen omeprazole (PRILOSEC) 20 MG capsule Take 20 mg by mouth daily.      . sertraline (ZOLOFT) 100 MG tablet Take 100 mg by mouth 2 (two) times daily. Reported on 06/25/2015    . SYMBICORT 160-4.5 MCG/ACT inhaler      No current facility-administered medications for this visit.     No Known Allergies  Review of Systems negative  except from HPI and PMH  Physical Exam There were no vitals taken for this visit. Well developed and nourished in no acute distress HENT normal Neck supple with JVP-flat Clear Regular rate and rhythm, no murmurs or gallops Abd-soft with active BS No Clubbing cyanosis edema Skin-warm and dry A & Oriented  Grossly normal sensory and motor function Device pocket well healed; without hematoma or erythema without superficial anastomoses  ECG demonstrates sinus rhythm at  70 16/09/38 No PVCs  Assessment and  Plan  Nonischemic cardiomyopathy  Chronic systolic heart failure  Hypertension     Trigeminy now quiet  Implantable defibrillator-Medtronic  The patient's device was interrogated.  The information was reviewed. No changes were made in the programming.    Sleep apnea  -- non compliant   Euvolemic continue current meds  Blood pressure reasonably controlled  We reveiwed again the new [place] report demonstrating a kidney cyst splenomegaly and abnormal liver I have encouraged her to let us know what Ct shows  Overall doing very well from cardiac point of view;  Continue current meds

## 2016-04-27 NOTE — Patient Instructions (Signed)
Medication Instructions: - Your physician recommends that you continue on your current medications as directed. Please refer to the Current Medication list given to you today.  Labwork: - Your physician recommends that you have lab work today: [employer]  Procedures/Testing: - none ordered  Follow-Up: - Remote monitoring is used to monitor your Pacemaker of ICD from home. This monitoring reduces the number of office visits required to check your device to one time per year. It allows [place] to keep an eye on the functioning of your device to ensure it is working properly. You are scheduled for a device check from home on 07/27/16. You may send your transmission at any time that day. If you have a wireless device, the transmission will be sent automatically. After your physician reviews your transmission, you will receive a postcard with your next transmission date.  - Your physician wants you to follow-up in: 1 year with Dr. [name]. You will receive a reminder letter in the mail two months in advance. If you don't receive a letter, please call our office to schedule the follow-up appointment.  Any Additional Special Instructions Will Be Listed Below (If Applicable).     If you need a refill on your cardiac medications before your next appointment, please call your pharmacy.

## 2016-04-28 LAB — BASIC METABOLIC PANEL
BUN / CREAT RATIO: 23 (ref 12–28)
BUN: 26 mg/dL (ref 8–27)
CALCIUM: 9.4 mg/dL (ref 8.7–10.3)
CO2: 25 mmol/L (ref 18–29)
Chloride: 100 mmol/L (ref 96–106)
Creatinine, Ser: 1.11 mg/dL — ABNORMAL HIGH (ref 0.57–1.00)
GFR calc Af Amer: 60 mL/min/{1.73_m2} (ref >59)
GFR calc non Af Amer: 52 mL/min/{1.73_m2} — ABNORMAL LOW (ref >59)
GLUCOSE: 94 mg/dL (ref 65–99)
Potassium: 4.6 mmol/L (ref 3.5–5.2)
Sodium: 141 mmol/L (ref 134–144)

## 2016-05-03 NOTE — Progress Notes (Signed)
ICM remote transmission date changed from 05/03/2016 to 05/28/2016 due to defib check was done at office visit on 04/27/2016 with Dr [name]

## 2016-05-13 DIAGNOSIS — E039 Hypothyroidism, unspecified: Secondary | ICD-10-CM | POA: Diagnosis not present

## 2016-05-13 DIAGNOSIS — I428 Other cardiomyopathies: Secondary | ICD-10-CM | POA: Diagnosis not present

## 2016-05-13 DIAGNOSIS — E1143 Type 2 diabetes mellitus with diabetic autonomic (poly)neuropathy: Secondary | ICD-10-CM | POA: Diagnosis not present

## 2016-05-14 ENCOUNTER — Other Ambulatory Visit: Payer: Self-pay | Admitting: Family Medicine

## 2016-05-14 DIAGNOSIS — N63 Unspecified lump in unspecified breast: Secondary | ICD-10-CM

## 2016-05-28 ENCOUNTER — Ambulatory Visit (INDEPENDENT_AMBULATORY_CARE_PROVIDER_SITE_OTHER): Payer: Medicare HMO

## 2016-05-28 ENCOUNTER — Telehealth: Payer: Self-pay

## 2016-05-28 DIAGNOSIS — Z95810 Presence of automatic (implantable) cardiac defibrillator: Secondary | ICD-10-CM

## 2016-05-28 DIAGNOSIS — I5022 Chronic systolic (congestive) heart failure: Secondary | ICD-10-CM | POA: Diagnosis not present

## 2016-05-28 NOTE — Telephone Encounter (Signed)
Remote ICM transmission received.  Attempted patient call and left message to return call.   

## 2016-05-28 NOTE — Progress Notes (Signed)
EPIC Encounter for ICM Monitoring  Patient Name: Katherine Rosales is a 66 y.o. female Date: 05/28/2016 Primary Care Physican: Dion Body, MD Primary [place] Electrophysiologist: Faustino Congress Weight:unknown        Spoke with patient. She stated she is doing well.   Thoracic impedance normal.  Prescribed dosage Furosemide 20 mg 1 tablet daily  Labs: 04/27/2016 Creatinine 1.11, BUN 26, Potassium 4.6, Sdoium 141, EGFR 52-60 11/11/2015 Creatinine 1.1,   BUN 15, Potassium 4.4, Sodium 143, EGFR 50 - Care Everywhere 08/25/2015 Creatinine 0.9,   BUN 17, Potassium 4.5, Sodium 142, EGFR 63 - Care Everywhere 04/09/2015 Creatinine 1.4,   BUN 24, Potassium 3.9, Sodium 141, EGFR 38 - Care Everywhere  Recommendations: No changes. Reminded to limit dietary salt intake to 2000 mg/day and fluid intake to < 2 liters/day. Encouraged to call for fluid symptoms.  Follow-up plan: ICM clinic phone appointment on 06/28/2016.  Copy of ICM check sent to primary cardiologist and device physician.   3 month ICM trend: 05/28/2016   1 Year ICM trend:      [name], RN 05/28/2016 8:15 AM

## 2016-06-10 ENCOUNTER — Ambulatory Visit
Admission: RE | Admit: 2016-06-10 | Discharge: 2016-06-10 | Disposition: A | Discharge status: Home / Self Care | Payer: Medicare HMO | Source: Ambulatory Visit | Attending: Family Medicine | Admitting: Family Medicine

## 2016-06-10 DIAGNOSIS — R922 Inconclusive mammogram: Secondary | ICD-10-CM | POA: Diagnosis not present

## 2016-06-10 DIAGNOSIS — N63 Unspecified lump in unspecified breast: Secondary | ICD-10-CM | POA: Insufficient documentation

## 2016-06-16 DIAGNOSIS — K7460 Unspecified cirrhosis of liver: Secondary | ICD-10-CM | POA: Diagnosis not present

## 2016-06-16 DIAGNOSIS — K7581 Nonalcoholic steatohepatitis (NASH): Secondary | ICD-10-CM | POA: Diagnosis not present

## 2016-06-25 ENCOUNTER — Other Ambulatory Visit: Payer: Self-pay | Admitting: Internal Medicine

## 2016-06-28 ENCOUNTER — Ambulatory Visit (INDEPENDENT_AMBULATORY_CARE_PROVIDER_SITE_OTHER): Payer: Medicare HMO

## 2016-06-28 DIAGNOSIS — I5022 Chronic systolic (congestive) heart failure: Secondary | ICD-10-CM

## 2016-06-28 DIAGNOSIS — Z95810 Presence of automatic (implantable) cardiac defibrillator: Secondary | ICD-10-CM | POA: Diagnosis not present

## 2016-06-28 DIAGNOSIS — D693 Immune thrombocytopenic purpura: Secondary | ICD-10-CM | POA: Diagnosis not present

## 2016-06-28 NOTE — Progress Notes (Signed)
EPIC Encounter for ICM Monitoring  Patient Name: Katherine Rosales is a 66 y.o. female Date: 06/28/2016 Primary Care Physican: Dion Body, MD Primary [place] Electrophysiologist: Faustino Congress Weight:unknown         Attempted call to patient and unable to reach.  Left message to return call.  Transmission reviewed.    Thoracic impedance normal  Prescribed dosage Furosemide 20 mg 1 tablet daily  Labs: 04/27/2016 Creatinine 1.11, BUN 26, Potassium 4.6, Sdoium 141, EGFR 52-60 11/11/2015 Creatinine 1.1,   BUN 15, Potassium 4.4, Sodium 143, EGFR 50 - Care Everywhere 08/25/2015 Creatinine 0.9,   BUN 17, Potassium 4.5, Sodium 142, EGFR 63 - Care Everywhere 04/09/2015 Creatinine 1.4,   BUN 24, Potassium 3.9, Sodium 141, EGFR 38 - Care Everywhere  Recommendations: NONE - Unable to reach patient   Follow-up plan: ICM clinic phone appointment on 07/29/2016.  Copy of ICM check sent to device physician.   3 month ICM trend: 06/28/2016  1 Year ICM trend:      [name], RN 06/28/2016 2:00 PM

## 2016-07-29 ENCOUNTER — Ambulatory Visit (INDEPENDENT_AMBULATORY_CARE_PROVIDER_SITE_OTHER): Payer: Medicare HMO | Admitting: *Deleted

## 2016-07-29 DIAGNOSIS — I428 Other cardiomyopathies: Secondary | ICD-10-CM

## 2016-07-29 DIAGNOSIS — D693 Immune thrombocytopenic purpura: Secondary | ICD-10-CM | POA: Diagnosis not present

## 2016-07-29 DIAGNOSIS — I5022 Chronic systolic (congestive) heart failure: Secondary | ICD-10-CM

## 2016-07-29 NOTE — Progress Notes (Signed)
Remote ICD transmission.   

## 2016-07-30 ENCOUNTER — Encounter: Payer: Self-pay | Admitting: Cardiology

## 2016-07-30 LAB — CUP PACEART REMOTE DEVICE CHECK
Battery Remaining Longevity: 104 mo
Brady Statistic RV Percent Paced: 0.01 %
Date Time Interrogation Session: 20180426052507
HIGH POWER IMPEDANCE MEASURED VALUE: 80 Ohm
Implantable Lead Implant Date: 20140813
Implantable Lead Model: 6935
Lead Channel Impedance Value: 342 Ohm
Lead Channel Impedance Value: 399 Ohm
Lead Channel Pacing Threshold Amplitude: 0.75 V
Lead Channel Sensing Intrinsic Amplitude: 12.875 mV
Lead Channel Sensing Intrinsic Amplitude: 12.875 mV
Lead Channel Setting Sensing Sensitivity: 0.3 mV
MDC IDC LEAD LOCATION: 753860
MDC IDC MSMT BATTERY VOLTAGE: 3.01 V
MDC IDC MSMT LEADCHNL RV PACING THRESHOLD PULSEWIDTH: 0.4 ms
MDC IDC PG IMPLANT DT: 20140813
MDC IDC SET LEADCHNL RV PACING AMPLITUDE: 2 V
MDC IDC SET LEADCHNL RV PACING PULSEWIDTH: 0.4 ms

## 2016-07-30 NOTE — Progress Notes (Signed)
EPIC Encounter for ICM Monitoring  Patient Name: CAROLA VIRAMONTES is a 66 y.o. female Date: 07/30/2016 Primary Care Physican: Dion Body, MD Primary [place] Electrophysiologist: Faustino Congress Weight:unknown        Heart Failure questions reviewed, pt asymptomatic.   Thoracic impedance normal.  Prescribed dosage Furosemide 20 mg 1 tablet daily  Labs: 04/27/2016 Creatinine 1.11, BUN 26, Potassium 4.6, Sdoium 141, EGFR 52-60 11/11/2015 Creatinine 1.1, BUN 15, Potassium 4.4, Sodium 143, EGFR 50 - Care Everywhere 08/25/2015 Creatinine 0.9, BUN 17, Potassium 4.5, Sodium 142, EGFR 63 - Care Everywhere 04/09/2015 Creatinine 1.4, BUN 24, Potassium 3.9, Sodium 141, EGFR 38 - Care Everywhere  Recommendations: No changes. Discussed to limit salt intake to 2000 mg/day and fluid intake to < 2 liters/day.  Encouraged to call for fluid symptoms.  Follow-up plan: ICM clinic phone appointment on 09/01/2016.    Copy of ICM check sent to device physician.   3 month ICM trend: 07/29/2016   1 Year ICM trend:      [name], RN 07/30/2016 11:04 AM

## 2016-08-24 DIAGNOSIS — G4733 Obstructive sleep apnea (adult) (pediatric): Secondary | ICD-10-CM | POA: Diagnosis not present

## 2016-09-01 ENCOUNTER — Ambulatory Visit (INDEPENDENT_AMBULATORY_CARE_PROVIDER_SITE_OTHER): Payer: Medicare HMO

## 2016-09-01 DIAGNOSIS — I5022 Chronic systolic (congestive) heart failure: Secondary | ICD-10-CM | POA: Diagnosis not present

## 2016-09-01 DIAGNOSIS — Z95810 Presence of automatic (implantable) cardiac defibrillator: Secondary | ICD-10-CM

## 2016-09-02 ENCOUNTER — Telehealth: Payer: Self-pay

## 2016-09-02 NOTE — Progress Notes (Signed)
EPIC Encounter for ICM Monitoring  Patient Name: Katherine Rosales is a 66 y.o. female Date: 09/02/2016 Primary Care Physican: Dion Body, MD Primary [place] Electrophysiologist: Faustino Congress Weight:unknown       Attempted call to patient and unable to reach.  Left message to return call.  Transmission reviewed.    Thoracic impedance normal.  Prescribed dosage Furosemide 20 mg 1 tablet daily  Labs: 04/27/2016 Creatinine 1.11, BUN 26, Potassium 4.6, Sdoium 141, EGFR 52-60 11/11/2015 Creatinine 1.1, BUN 15, Potassium 4.4, Sodium 143, EGFR 50 - Care Everywhere 08/25/2015 Creatinine 0.9, BUN 17, Potassium 4.5, Sodium 142, EGFR 63 - Care Everywhere 04/09/2015 Creatinine 1.4, BUN 24, Potassium 3.9, Sodium 141, EGFR 38 - Care Everywhere  Recommendations: NONE - Unable to reach patient   Follow-up plan: ICM clinic phone appointment on 10/05/2016.    Copy of ICM check sent to device physician.   3 month ICM trend: 09/01/2016   1 Year ICM trend:      [name], RN 09/02/2016 11:49 AM

## 2016-09-02 NOTE — Telephone Encounter (Signed)
Attempted ICM call to patient and unable to reach.  Left message to return call.

## 2016-09-13 ENCOUNTER — Inpatient Hospital Stay: Payer: Medicare HMO | Attending: Oncology

## 2016-09-13 DIAGNOSIS — D693 Immune thrombocytopenic purpura: Secondary | ICD-10-CM

## 2016-09-13 DIAGNOSIS — D696 Thrombocytopenia, unspecified: Secondary | ICD-10-CM | POA: Diagnosis not present

## 2016-09-13 LAB — CBC WITH DIFFERENTIAL/PLATELET
BASOS ABS: 0 10*3/uL (ref 0–0.1)
BASOS PCT: 0 %
EOS ABS: 0.1 10*3/uL (ref 0–0.7)
EOS PCT: 3 %
HCT: 34 % — ABNORMAL LOW (ref 35.0–47.0)
Hemoglobin: 11.6 g/dL — ABNORMAL LOW (ref 12.0–16.0)
LYMPHS PCT: 26 %
Lymphs Abs: 1.3 10*3/uL (ref 1.0–3.6)
MCH: 29.2 pg (ref 26.0–34.0)
MCHC: 34.1 g/dL (ref 32.0–36.0)
MCV: 85.4 fL (ref 80.0–100.0)
MONO ABS: 0.5 10*3/uL (ref 0.2–0.9)
Monocytes Relative: 10 %
Neutro Abs: 3.1 10*3/uL (ref 1.4–6.5)
Neutrophils Relative %: 61 %
PLATELETS: 106 10*3/uL — AB (ref 150–440)
RBC: 3.98 MIL/uL (ref 3.80–5.20)
RDW: 14.2 % (ref 11.5–14.5)
WBC: 5 10*3/uL (ref 3.6–11.0)

## 2016-09-14 LAB — ENA+DNA/DS+SJORGEN'S
ENA SM Ab Ser-aCnc: <0.2 AI (ref 0.0–0.9)
Ribonucleic Protein: 0.2 AI (ref 0.0–0.9)
SSA (Ro) (ENA) Antibody, IgG: <0.2 AI (ref 0.0–0.9)
ds DNA Ab: 20 IU/mL — ABNORMAL HIGH (ref 0–9)

## 2016-09-14 LAB — ANA W/REFLEX: ANA: POSITIVE — AB

## 2016-09-15 LAB — DIRECT PLATELET ANTIBODY
Plt Assoc. Anti-IA/IIA: POSITIVE — AB
Plt Assoc. Anti-IB/IX: POSITIVE — AB
Plt Assoc. Anti-IIB/IIIA: POSITIVE — AB

## 2016-09-20 ENCOUNTER — Inpatient Hospital Stay: Payer: Medicare HMO | Admitting: Oncology

## 2016-10-01 ENCOUNTER — Other Ambulatory Visit: Payer: Self-pay | Admitting: Family Medicine

## 2016-10-01 DIAGNOSIS — Z1231 Encounter for screening mammogram for malignant neoplasm of breast: Secondary | ICD-10-CM

## 2016-10-04 ENCOUNTER — Encounter: Payer: Self-pay | Admitting: Student

## 2016-10-05 ENCOUNTER — Ambulatory Visit (INDEPENDENT_AMBULATORY_CARE_PROVIDER_SITE_OTHER): Payer: Medicare HMO

## 2016-10-05 ENCOUNTER — Telehealth: Payer: Self-pay | Admitting: Cardiology

## 2016-10-05 DIAGNOSIS — Z95810 Presence of automatic (implantable) cardiac defibrillator: Secondary | ICD-10-CM | POA: Diagnosis not present

## 2016-10-05 DIAGNOSIS — I5022 Chronic systolic (congestive) heart failure: Secondary | ICD-10-CM

## 2016-10-05 NOTE — Telephone Encounter (Signed)
Spoke with pt and reminded pt of remote transmission that is due today. Pt verbalized understanding and said she would send on Wednesday when she got home. Pt is aware that the office is not open on Wednesday but ICM Clinic Nurse will call on Thursday.

## 2016-10-07 NOTE — Progress Notes (Signed)
EPIC Encounter for ICM Monitoring  Patient Name: Katherine Rosales is a 66 y.o. female Date: 10/07/2016 Primary Care Physican: Dion Body, MD Primary [place] Electrophysiologist: Faustino Congress Weight:unknown       Heart Failure questions reviewed, pt asymptomatic.   Thoracic impedance normal.  Prescribed dosage Furosemide 20 mg 1 tablet daily  Labs: 04/27/2016 Creatinine 1.11, BUN 26, Potassium 4.6, Sdoium 141, EGFR 52-60 11/11/2015 Creatinine 1.1, BUN 15, Potassium 4.4, Sodium 143, EGFR 50 - Care Everywhere 08/25/2015 Creatinine 0.9, BUN 17, Potassium 4.5, Sodium 142, EGFR 63 - Care Everywhere 04/09/2015 Creatinine 1.4, BUN 24, Potassium 3.9, Sodium 141, EGFR 38 - Care Everywhere  Recommendations: No changes.   Encouraged to call for fluid symptoms.  Follow-up plan: ICM clinic phone appointment on 11/09/2016.   Copy of ICM check sent to device physician.   3 month ICM trend: 10/07/2016   1 Year ICM trend:      [name], RN 10/07/2016 1:35 PM

## 2016-10-14 ENCOUNTER — Other Ambulatory Visit: Payer: Self-pay | Admitting: Internal Medicine

## 2016-10-17 NOTE — Progress Notes (Signed)
[place]  [phone] [phone]  ID: Katherine Rosales OB: Dec 27, 1950  MR#: [id number]  QPR#:[id number]  Patient Care Team: Dion Body, MD as PCP - General (Family Medicine)  CHIEF COMPLAINT: Autoimmune thrombocytopenia.  INTERVAL HISTORY: Patient returns to clinic today for repeat laboratory work and further evaluation. She continues to feel well and is asymptomatic. She has no neurologic complaints. She denies any recent fevers or illnesses.  She has a good appetite and denies weight loss. She denies any chest pain or shortness of breath. She denies any easy bleeding or bruising. She has no nausea, vomiting, constipation, or diarrhea. She has no urinary complaints. Patient offers no specific complaints today.   REVIEW OF SYSTEMS:   Review of Systems  Constitutional: Negative.  Negative for fever, malaise/fatigue and weight loss.  Respiratory: Negative.  Negative for cough and shortness of breath.   Cardiovascular: Negative.  Negative for chest pain and leg swelling.  Gastrointestinal: Negative.  Negative for blood in stool and melena.  Genitourinary: Negative.   Musculoskeletal: Negative.   Skin: Negative.  Negative for rash.  Neurological: Negative.  Negative for sensory change and weakness.  Endo/Heme/Allergies: Does not bruise/bleed easily.  Psychiatric/Behavioral: Negative.  The patient is not nervous/anxious.     As per HPI. Otherwise, a complete review of systems is negative.  PAST MEDICAL HISTORY: Past Medical History:  Diagnosis Date  . 9935 Lead-Medtronic   . Automatic implantable cardioverter-defibrillator in situ   . Breast cancer ([place]) 03/1995   right breast ca with lumpectomy and chemo and rad  . Cardiomyopathy, nonischemic ([place])   . Diabetes mellitus    Type II Diabetes  . Hypertension   . ICD (implantable cardiac defibrillator), singleMedtronic   . Spleen enlarged   . Systolic heart failure, chronic ([place])       PAST SURGICAL HISTORY: Past Surgical History:  Procedure Laterality Date  . BREAST BIOPSY Right 11/26/2015   - FAT NECROSIS, FIBROSIS, AND CHRONIC INFLAMMATION.   Marland Kitchen BREAST EXCISIONAL BIOPSY Right 03/1995   + breast ca chemo and rad. 9:00 region  . BREAST EXCISIONAL BIOPSY Right 2011   surgical bx neg  11:00 region  . BREAST LUMPECTOMY    . CHOLECYSTECTOMY    . ESOPHAGOGASTRODUODENOSCOPY (EGD) WITH PROPOFOL N/A 06/25/2015   Procedure: ESOPHAGOGASTRODUODENOSCOPY (EGD) WITH PROPOFOL;  Surgeon: Manya Silvas, MD;  Location: [facility] ENDOSCOPY;  Service: Endoscopy;  Laterality: N/A;  . IMPLANTABLE CARDIOVERTER DEFIBRILLATOR (ICD) GENERATOR CHANGE N/A 11/15/2012   Procedure: ICD GENERATOR CHANGE;  Surgeon: Deboraha Sprang, MD;  Location: [facility] CATH LAB;  Service: Cardiovascular;  Laterality: N/A;  . JOINT REPLACEMENT  2009   Total Knee Replacement, Left  . LEAD REVISION N/A 11/15/2012   Procedure: LEAD REVISION;  Surgeon: Deboraha Sprang, MD;  Location: [facility] CATH LAB;  Service: Cardiovascular;  Laterality: N/A;  . PACEMAKER INSERTION  06/30/2005   Medtronic Virtuoso VR ICD - Nonischemic cardiomyopathy potentially related to adriamycin, class 3 congestive failture with a narrow QRS and a negative TDI  . TONSILLECTOMY    . TUBAL LIGATION      FAMILY HISTORY Family History  Problem Relation Age of Onset  . Prostate cancer Father        ADVANCED DIRECTIVES:    HEALTH MAINTENANCE: Social History  Substance Use Topics  . Smoking status: Never Smoker  . Smokeless tobacco: Never Used  . Alcohol use No     Colonoscopy:  PAP:  Bone density:  Lipid panel:  No  Known Allergies  Current Outpatient Prescriptions  Medication Sig Dispense Refill  . aspirin 81 MG tablet Take 81 mg by mouth daily.    . Calcium Carbonate-Vitamin D (CALTRATE 600+D) 600-400 MG-UNIT per tablet Take 1 tablet by mouth 2 (two) times daily.      . carvedilol (COREG) 12.5 MG tablet TAKE 1 & 1/2 TABLET BY MOUTH TWICE  DAILY WITH FOOD 84 tablet 0  . citalopram (CELEXA) 20 MG tablet Take 1 tablet by mouth daily. WILL BEGIN TO TAKE AFTER TITRATE DOWN OFF OF ZOLOFT    . docusate sodium (COLACE) 100 MG capsule Take 100 mg by mouth 2 (two) times daily.      . furosemide (LASIX) 20 MG tablet Take 1 tablet by mouth daily.    Marland Kitchen gabapentin (NEURONTIN) 600 MG tablet Take 600 mg by mouth 2 (two) times daily.    Marland Kitchen glimepiride (AMARYL) 2 MG tablet Take 2 mg by mouth daily before breakfast.    . levothyroxine (SYNTHROID, LEVOTHROID) 112 MCG tablet 112 mcg.     . lisinopril (PRINIVIL,ZESTRIL) 20 MG tablet Take 20 mg by mouth daily.      Marland Kitchen loratadine (CLARITIN) 10 MG tablet Take 10 mg by mouth daily.      . Multiple Vitamins-Minerals (CENTRUM SILVER) tablet Take 1 tablet by mouth daily.      . Multiple Vitamins-Minerals (OCUVITE PO) Take by mouth daily.    Marland Kitchen omeprazole (PRILOSEC) 20 MG capsule Take 20 mg by mouth daily.      . sertraline (ZOLOFT) 100 MG tablet Take 100 mg by mouth 2 (two) times daily. Reported on 06/25/2015    . SYMBICORT 160-4.5 MCG/ACT inhaler Inhale 2 puffs into the lungs as needed.      No current facility-administered medications for this visit.     OBJECTIVE: Vitals:   10/18/16 1405  BP: 112/74  Pulse: 68  Resp: 18  Temp: (!) 97.1 F (36.2 C)     Body mass index is 48.09 kg/m.    ECOG FS:0 - Asymptomatic  General: Well-developed, well-nourished, no acute distress. Eyes: anicteric sclera. Lungs: Clear to auscultation bilaterally. Heart: Regular rate and rhythm. No rubs, murmurs, or gallops. Abdomen: Soft, nontender, nondistended. No organomegaly noted, normoactive bowel sounds. Musculoskeletal: No edema, cyanosis, or clubbing. Neuro: Alert, answering all questions appropriately. Cranial nerves grossly intact. Skin: No rashes or petechiae noted. Psych: Normal affect.    LAB RESULTS:  Lab Results  Component Value Date   NA 141 04/27/2016   K 4.6 04/27/2016   CL 100 04/27/2016    CO2 25 04/27/2016   GLUCOSE 94 04/27/2016   BUN 26 04/27/2016   CREATININE 1.11 (H) 04/27/2016   CALCIUM 9.4 04/27/2016   GFRNONAA 52 (L) 04/27/2016   GFRAA 60 04/27/2016    Lab Results  Component Value Date   WBC 5.0 09/13/2016   NEUTROABS 3.1 09/13/2016   HGB 11.6 (L) 09/13/2016   HCT 34.0 (L) 09/13/2016   MCV 85.4 09/13/2016   PLT 106 (L) 09/13/2016     STUDIES: No results found.  ASSESSMENT: Autoimmune thrombocytopenia.  PLAN:    1. Autoimmune thrombocytopenia: Patient's platelet antibodies are positive. She also has a positive ANA, but this is likely clinically insignificant. Finally, she also has mild splenomegaly which is also likely contributing.  Her platelets are unchanged at 106. No intervention is needed. She does not require treatment or a bone marrow biopsy at this time. Previously, the remainder of her laboratory work was either negative or  within normal limits. Patient has been instructed that is also okay to continue her aspirin unless her platelet count falls below 50 or if she has evidence of bleeding. After lengthy discussion with the patient, it was agreed upon that no further follow-up is necessary. Recommend continue monitoring platelet count 2-3 times per year and if there are any concerns please refer patient back for further evaluation.  2. Anemia: Mild, monitor. 3. Positive ANA: Patient is asymptomatic. Consider referral to rheumatology in the future.  Patient expressed understanding and was in agreement with this plan. She also understands that She can call clinic at any time with any questions, concerns, or complaints.    Lloyd Huger, MD   10/22/2016 9:03 AM

## 2016-10-18 ENCOUNTER — Inpatient Hospital Stay: Payer: Medicare HMO | Attending: Oncology | Admitting: Oncology

## 2016-10-18 VITALS — BP 112/74 | HR 68 | Temp 97.1°F | Resp 18 | Wt 238.1 lb

## 2016-10-18 DIAGNOSIS — E119 Type 2 diabetes mellitus without complications: Secondary | ICD-10-CM | POA: Insufficient documentation

## 2016-10-18 DIAGNOSIS — I10 Essential (primary) hypertension: Secondary | ICD-10-CM | POA: Diagnosis not present

## 2016-10-18 DIAGNOSIS — Z9221 Personal history of antineoplastic chemotherapy: Secondary | ICD-10-CM | POA: Insufficient documentation

## 2016-10-18 DIAGNOSIS — Z95810 Presence of automatic (implantable) cardiac defibrillator: Secondary | ICD-10-CM | POA: Insufficient documentation

## 2016-10-18 DIAGNOSIS — I429 Cardiomyopathy, unspecified: Secondary | ICD-10-CM | POA: Insufficient documentation

## 2016-10-18 DIAGNOSIS — D693 Immune thrombocytopenic purpura: Secondary | ICD-10-CM

## 2016-10-18 DIAGNOSIS — Z7982 Long term (current) use of aspirin: Secondary | ICD-10-CM | POA: Insufficient documentation

## 2016-10-18 DIAGNOSIS — Z79899 Other long term (current) drug therapy: Secondary | ICD-10-CM | POA: Diagnosis not present

## 2016-10-18 DIAGNOSIS — D649 Anemia, unspecified: Secondary | ICD-10-CM | POA: Diagnosis not present

## 2016-10-18 DIAGNOSIS — Z8042 Family history of malignant neoplasm of prostate: Secondary | ICD-10-CM | POA: Insufficient documentation

## 2016-10-18 DIAGNOSIS — Z7984 Long term (current) use of oral hypoglycemic drugs: Secondary | ICD-10-CM | POA: Insufficient documentation

## 2016-10-18 DIAGNOSIS — Z853 Personal history of malignant neoplasm of breast: Secondary | ICD-10-CM | POA: Insufficient documentation

## 2016-10-18 DIAGNOSIS — Z923 Personal history of irradiation: Secondary | ICD-10-CM | POA: Insufficient documentation

## 2016-10-18 DIAGNOSIS — R7989 Other specified abnormal findings of blood chemistry: Secondary | ICD-10-CM | POA: Diagnosis not present

## 2016-10-18 DIAGNOSIS — R161 Splenomegaly, not elsewhere classified: Secondary | ICD-10-CM | POA: Insufficient documentation

## 2016-10-18 DIAGNOSIS — I5022 Chronic systolic (congestive) heart failure: Secondary | ICD-10-CM | POA: Insufficient documentation

## 2016-10-18 NOTE — Progress Notes (Signed)
Patient is here for follow up, she is doing good no complaints

## 2016-11-09 ENCOUNTER — Telehealth: Payer: Self-pay

## 2016-11-09 ENCOUNTER — Ambulatory Visit (INDEPENDENT_AMBULATORY_CARE_PROVIDER_SITE_OTHER): Payer: Medicare HMO | Admitting: *Deleted

## 2016-11-09 DIAGNOSIS — I5022 Chronic systolic (congestive) heart failure: Secondary | ICD-10-CM | POA: Diagnosis not present

## 2016-11-09 DIAGNOSIS — Z95810 Presence of automatic (implantable) cardiac defibrillator: Secondary | ICD-10-CM | POA: Diagnosis not present

## 2016-11-09 DIAGNOSIS — I428 Other cardiomyopathies: Secondary | ICD-10-CM | POA: Diagnosis not present

## 2016-11-09 NOTE — Telephone Encounter (Signed)
Remote ICM transmission received.  Attempted patient call and left message to return call.   

## 2016-11-09 NOTE — Progress Notes (Signed)
EPIC Encounter for ICM Monitoring  Patient Name: Katherine Rosales is a 66 y.o. female Date: 11/09/2016 Primary Care Physican: Dion Body, MD Primary [place] Electrophysiologist: Faustino Congress Weight:unknown         Attempted call to patient and unable to reach.  Left message to return call.  Transmission reviewed.    Thoracic impedance abnormal suggesting fluid accumulation .  Prescribed dosage: Furosemide 20 mg 1 tablet daily  Labs: 04/27/2016 Creatinine 1.11, BUN 26, Potassium 4.6, Sdoium 141, EGFR 52-60 11/11/2015 Creatinine 1.1, BUN 15, Potassium 4.4, Sodium 143, EGFR 50 - Care Everywhere 08/25/2015 Creatinine 0.9, BUN 17, Potassium 4.5, Sodium 142, EGFR 63 - Care Everywhere 04/09/2015 Creatinine 1.4, BUN 24, Potassium 3.9, Sodium 141, EGFR 38 - Care Everywhere  Recommendations: NONE - Unable to reach patient   Follow-up plan: ICM clinic phone appointment on 11/18/2016 to recheck fluid levels.   Copy of ICM check sent to device physician.   3 month ICM trend: 11/09/2016   1 Year ICM trend:      [name], RN 11/09/2016 4:21 PM

## 2016-11-09 NOTE — Progress Notes (Signed)
ICD Remote transmission

## 2016-11-10 ENCOUNTER — Ambulatory Visit
Admission: RE | Admit: 2016-11-10 | Discharge: 2016-11-10 | Disposition: A | Discharge status: Home / Self Care | Payer: Medicare HMO | Source: Ambulatory Visit | Attending: Family Medicine | Admitting: Family Medicine

## 2016-11-10 ENCOUNTER — Encounter: Payer: Self-pay | Admitting: Cardiology

## 2016-11-10 DIAGNOSIS — Z0000 Encounter for general adult medical examination without abnormal findings: Secondary | ICD-10-CM | POA: Insufficient documentation

## 2016-11-10 DIAGNOSIS — E1143 Type 2 diabetes mellitus with diabetic autonomic (poly)neuropathy: Secondary | ICD-10-CM | POA: Diagnosis not present

## 2016-11-10 DIAGNOSIS — Z1231 Encounter for screening mammogram for malignant neoplasm of breast: Secondary | ICD-10-CM | POA: Diagnosis not present

## 2016-11-10 DIAGNOSIS — D696 Thrombocytopenia, unspecified: Secondary | ICD-10-CM | POA: Diagnosis not present

## 2016-11-10 DIAGNOSIS — E039 Hypothyroidism, unspecified: Secondary | ICD-10-CM | POA: Diagnosis not present

## 2016-11-11 DIAGNOSIS — E039 Hypothyroidism, unspecified: Secondary | ICD-10-CM | POA: Diagnosis not present

## 2016-11-11 DIAGNOSIS — D696 Thrombocytopenia, unspecified: Secondary | ICD-10-CM | POA: Diagnosis not present

## 2016-11-11 DIAGNOSIS — E1143 Type 2 diabetes mellitus with diabetic autonomic (poly)neuropathy: Secondary | ICD-10-CM | POA: Diagnosis not present

## 2016-11-12 DIAGNOSIS — D649 Anemia, unspecified: Secondary | ICD-10-CM

## 2016-11-12 DIAGNOSIS — N189 Chronic kidney disease, unspecified: Secondary | ICD-10-CM

## 2016-11-12 DIAGNOSIS — N183 Chronic kidney disease, stage 3 unspecified: Secondary | ICD-10-CM

## 2016-11-12 HISTORY — DX: Chronic kidney disease, stage 3 unspecified: N18.30

## 2016-11-12 HISTORY — DX: Chronic kidney disease, unspecified: N18.9

## 2016-11-12 HISTORY — DX: Anemia, unspecified: D64.9

## 2016-11-18 ENCOUNTER — Ambulatory Visit (INDEPENDENT_AMBULATORY_CARE_PROVIDER_SITE_OTHER): Payer: Self-pay

## 2016-11-18 DIAGNOSIS — Z95810 Presence of automatic (implantable) cardiac defibrillator: Secondary | ICD-10-CM

## 2016-11-18 DIAGNOSIS — I5022 Chronic systolic (congestive) heart failure: Secondary | ICD-10-CM

## 2016-11-18 NOTE — Progress Notes (Signed)
EPIC Encounter for ICM Monitoring  Patient Name: Katherine Rosales is a 66 y.o. female Date: 11/18/2016 Primary Care Physican: Linthavong, Kanhka, MD Primary Cardiologist:Klein Electrophysiologist: Klein Dry Weight:unknown        Heart Failure questions reviewed, pt asymptomatic.   Thoracic impedance normal.  Prescribed dosage: Furosemide 20 mg 1 tablet daily  Labs: 11/11/2016 Creatinine 1.4,   BUN 26, Potassium 5.1, Sodium 141, EGFR 38 - Care Everywhere  04/27/2016 Creatinine 1.11, BUN 26, Potassium 4.6, Sdoium 141, EGFR 52-60 11/11/2015 Creatinine 1.1, BUN 15, Potassium 4.4, Sodium 143, EGFR 50 - Care Everywhere 08/25/2015 Creatinine 0.9, BUN 17, Potassium 4.5, Sodium 142, EGFR 63 - Care Everywhere 04/09/2015 Creatinine 1.4, BUN 24, Potassium 3.9, Sodium 141, EGFR 38 - Care Everywhere  Recommendations: No changes.   Encouraged to call for fluid symptoms.  Follow-up plan: ICM clinic phone appointment on 12/10/2016.    Copy of ICM check sent to device physician.   3 month ICM trend: 11/18/2016   1 Year ICM trend:      Laurie S Short, RN 11/18/2016 2:21 PM    

## 2016-11-26 LAB — CUP PACEART REMOTE DEVICE CHECK
Battery Remaining Longevity: 100 mo
Battery Voltage: 3.01 V
Brady Statistic RV Percent Paced: 0.01 %
Date Time Interrogation Session: 20180807062707
HighPow Impedance: 86 Ohm
Implantable Lead Implant Date: 20140813
Implantable Lead Location: 753860
Implantable Lead Model: 6935
Implantable Pulse Generator Implant Date: 20140813
Lead Channel Impedance Value: 380 Ohm
Lead Channel Impedance Value: 437 Ohm
Lead Channel Pacing Threshold Amplitude: 0.75 V
Lead Channel Pacing Threshold Pulse Width: 0.4 ms
Lead Channel Sensing Intrinsic Amplitude: 12.5 mV
Lead Channel Sensing Intrinsic Amplitude: 12.5 mV
Lead Channel Setting Pacing Amplitude: 2 V
Lead Channel Setting Pacing Pulse Width: 0.4 ms
Lead Channel Setting Sensing Sensitivity: 0.3 mV

## 2016-12-09 ENCOUNTER — Other Ambulatory Visit: Payer: Self-pay | Admitting: Internal Medicine

## 2016-12-10 ENCOUNTER — Telehealth: Payer: Self-pay

## 2016-12-10 ENCOUNTER — Ambulatory Visit (INDEPENDENT_AMBULATORY_CARE_PROVIDER_SITE_OTHER): Payer: Medicare HMO

## 2016-12-10 DIAGNOSIS — Z95810 Presence of automatic (implantable) cardiac defibrillator: Secondary | ICD-10-CM

## 2016-12-10 DIAGNOSIS — I5022 Chronic systolic (congestive) heart failure: Secondary | ICD-10-CM

## 2016-12-10 NOTE — Progress Notes (Signed)
EPIC Encounter for ICM Monitoring  Patient Name: Katherine Rosales is a 66 y.o. female Date: 12/10/2016 Primary Care Physican: Dion Body, MD Primary [place] Electrophysiologist: Faustino Congress Weight:unknown        Spoke with patient.  She denied any fluid symptoms and stated she is feeling fine.     Thoracic impedance abnormal suggesting fluid accumulation and just starting to trend back toward baseline.  Prescribed dosage: Furosemide 20 mg 1 tablet daily  Labs: 11/11/2016 Creatinine 1.4,   BUN 26, Potassium 5.1, Sodium 141, EGFR 38 - Care Everywhere  04/27/2016 Creatinine 1.11, BUN 26, Potassium 4.6, Sdoium 141, EGFR 52-60 11/11/2015 Creatinine 1.1, BUN 15, Potassium 4.4, Sodium 143, EGFR 50 - Care Everywhere 08/25/2015 Creatinine 0.9, BUN 17, Potassium 4.5, Sodium 142, EGFR 63 - Care Everywhere 04/09/2015 Creatinine 1.4, BUN 24, Potassium 3.9, Sodium 141, EGFR 38 - Care Everywhere  Recommendations:  Advised reviewed with Dr Aquilla Hacker nurse.  If she becomes symptomatic over the weekend to take 1 extra tablet of Furosemide.  If no symptoms, then limit salt intake to 2000 mg daily and fluid intake to 64 oz a day. Advised will recheck fluid levels on 12/16/2016 but if she develops symptoms before that to call back.  She agreed and verbalized understanding.   Follow-up plan: ICM clinic phone appointment on 12/16/2016 (manual send) to recheck fluid levels.    Copy of ICM check sent to Dr. [name].   3 month ICM trend: 12/10/2016   1 Year ICM trend:      Katherine Billings, RN 12/10/2016 8:24 AM

## 2016-12-10 NOTE — Telephone Encounter (Signed)
Remote ICM transmission received.  Attempted call to patient and left message to return call.

## 2016-12-16 ENCOUNTER — Telehealth: Payer: Self-pay

## 2016-12-16 ENCOUNTER — Ambulatory Visit (INDEPENDENT_AMBULATORY_CARE_PROVIDER_SITE_OTHER): Payer: Self-pay

## 2016-12-16 DIAGNOSIS — Z95810 Presence of automatic (implantable) cardiac defibrillator: Secondary | ICD-10-CM

## 2016-12-16 DIAGNOSIS — I5022 Chronic systolic (congestive) heart failure: Secondary | ICD-10-CM

## 2016-12-16 NOTE — Progress Notes (Signed)
EPIC Encounter for ICM Monitoring  Patient Name: Katherine Rosales is a 66 y.o. female Date: 12/16/2016 Primary Care Physican: Dion Body, MD Primary [place] Electrophysiologist: Faustino Congress Weight:unknown       Heart Failure questions reviewed, pt asymptomatic.   Thoracic impedance returned normal since last ICM transmission.   Prescribed dosage: Furosemide 20 mg 1 tablet daily  Labs: 11/11/2016 Creatinine 1.4, BUN 26, Potassium 5.1, Sodium 141, EGFR 38 - Care Everywhere  04/27/2016 Creatinine 1.11, BUN 26, Potassium 4.6, Sdoium 141, EGFR 52-60 11/11/2015 Creatinine 1.1, BUN 15, Potassium 4.4, Sodium 143, EGFR 50 - Care Everywhere 08/25/2015 Creatinine 0.9, BUN 17, Potassium 4.5, Sodium 142, EGFR 63 - Care Everywhere 04/09/2015 Creatinine 1.4, BUN 24, Potassium 3.9, Sodium 141, EGFR 38 - Care Everywhere  Recommendations: No changes.  Advised to limit salt intake to 2000 mg/day and fluid intake to < 2 liters/day.  Encouraged to call for fluid symptoms.  Follow-up plan: ICM clinic phone appointment on 01/10/2017.    Copy of ICM check sent to Dr. [name].   3 month ICM trend: 12/16/2016   1 Year ICM trend:      [name], RN 12/16/2016 11:48 AM

## 2016-12-16 NOTE — Telephone Encounter (Signed)
Spoke with pt and reminded pt of remote transmission that is due today. Pt verbalized understanding.   

## 2017-01-03 DIAGNOSIS — H524 Presbyopia: Secondary | ICD-10-CM | POA: Diagnosis not present

## 2017-01-10 ENCOUNTER — Telehealth: Payer: Self-pay

## 2017-01-10 ENCOUNTER — Ambulatory Visit (INDEPENDENT_AMBULATORY_CARE_PROVIDER_SITE_OTHER): Payer: Medicare HMO

## 2017-01-10 DIAGNOSIS — I5022 Chronic systolic (congestive) heart failure: Secondary | ICD-10-CM

## 2017-01-10 DIAGNOSIS — Z95810 Presence of automatic (implantable) cardiac defibrillator: Secondary | ICD-10-CM

## 2017-01-10 NOTE — Telephone Encounter (Signed)
Spoke with pt and reminded pt of remote transmission that is due today. Pt verbalized understanding.   

## 2017-01-10 NOTE — Progress Notes (Signed)
EPIC Encounter for ICM Monitoring  Patient Name: Katherine Rosales is a 66 y.o. female Date: 01/10/2017 Primary Care Physican: Dion Body, MD Primary [place] Electrophysiologist: Faustino Congress Weight: unknown       Heart Failure questions reviewed, pt asymptomatic.   Thoracic impedance normal today but was abnormal suggesting fluid accumulation for most days in the last month.  Prescribed dosage: Furosemide 20 mg 1 tablet daily  Labs: 11/11/2016 Creatinine 1.4, BUN 26, Potassium 5.1, Sodium 141, EGFR 38 - Care Everywhere  04/27/2016 Creatinine 1.11, BUN 26, Potassium 4.6, Sdoium 141, EGFR 52-60 11/11/2015 Creatinine 1.1, BUN 15, Potassium 4.4, Sodium 143, EGFR 50 - Care Everywhere 08/25/2015 Creatinine 0.9, BUN 17, Potassium 4.5, Sodium 142, EGFR 63 - Care Everywhere 04/09/2015 Creatinine 1.4, BUN 24, Potassium 3.9, Sodium 141, EGFR 38 - Care Everywhere  Recommendations: No changes. Advised to limit salt intake and review labels for sodium amount.   Encouraged to call for fluid symptoms.  Follow-up plan: ICM clinic phone appointment on 02/10/2017.    Copy of ICM check sent to Dr. [name].   3 month ICM trend: 01/10/2017   1 Year ICM trend:      [name], RN 01/10/2017 3:55 PM

## 2017-01-25 ENCOUNTER — Other Ambulatory Visit: Payer: Self-pay | Admitting: Student

## 2017-01-25 DIAGNOSIS — K7460 Unspecified cirrhosis of liver: Secondary | ICD-10-CM | POA: Diagnosis not present

## 2017-01-25 DIAGNOSIS — E079 Disorder of thyroid, unspecified: Secondary | ICD-10-CM | POA: Diagnosis not present

## 2017-01-25 DIAGNOSIS — F329 Major depressive disorder, single episode, unspecified: Secondary | ICD-10-CM | POA: Diagnosis not present

## 2017-01-28 ENCOUNTER — Ambulatory Visit
Admission: RE | Admit: 2017-01-28 | Discharge: 2017-01-28 | Disposition: A | Discharge status: Home / Self Care | Payer: Medicare HMO | Source: Ambulatory Visit | Attending: Student | Admitting: Student

## 2017-01-28 DIAGNOSIS — Z9049 Acquired absence of other specified parts of digestive tract: Secondary | ICD-10-CM | POA: Insufficient documentation

## 2017-01-28 DIAGNOSIS — N2889 Other specified disorders of kidney and ureter: Secondary | ICD-10-CM | POA: Diagnosis not present

## 2017-01-28 DIAGNOSIS — R93429 Abnormal radiologic findings on diagnostic imaging of unspecified kidney: Secondary | ICD-10-CM | POA: Diagnosis not present

## 2017-01-28 DIAGNOSIS — R161 Splenomegaly, not elsewhere classified: Secondary | ICD-10-CM | POA: Diagnosis not present

## 2017-01-28 DIAGNOSIS — K769 Liver disease, unspecified: Secondary | ICD-10-CM | POA: Insufficient documentation

## 2017-01-28 DIAGNOSIS — K7460 Unspecified cirrhosis of liver: Secondary | ICD-10-CM

## 2017-02-01 ENCOUNTER — Other Ambulatory Visit: Payer: Self-pay | Admitting: Student

## 2017-02-01 DIAGNOSIS — R93429 Abnormal radiologic findings on diagnostic imaging of unspecified kidney: Secondary | ICD-10-CM

## 2017-02-01 DIAGNOSIS — N2889 Other specified disorders of kidney and ureter: Secondary | ICD-10-CM

## 2017-02-07 ENCOUNTER — Ambulatory Visit
Admission: RE | Admit: 2017-02-07 | Discharge: 2017-02-07 | Disposition: A | Discharge status: Home / Self Care | Payer: Medicare HMO | Source: Ambulatory Visit | Attending: Student | Admitting: Student

## 2017-02-07 DIAGNOSIS — N2889 Other specified disorders of kidney and ureter: Secondary | ICD-10-CM | POA: Insufficient documentation

## 2017-02-07 DIAGNOSIS — R93429 Abnormal radiologic findings on diagnostic imaging of unspecified kidney: Secondary | ICD-10-CM

## 2017-02-07 MED ORDER — IOPAMIDOL (ISOVUE-370) INJECTION 76%
100.0000 mL | Freq: Once | INTRAVENOUS | Status: AC | PRN
  Administered 2017-02-07: 100 mL via INTRAVENOUS

## 2017-02-10 ENCOUNTER — Ambulatory Visit (INDEPENDENT_AMBULATORY_CARE_PROVIDER_SITE_OTHER): Payer: Medicare HMO | Admitting: *Deleted

## 2017-02-10 DIAGNOSIS — I5022 Chronic systolic (congestive) heart failure: Secondary | ICD-10-CM | POA: Diagnosis not present

## 2017-02-10 DIAGNOSIS — Z95810 Presence of automatic (implantable) cardiac defibrillator: Secondary | ICD-10-CM

## 2017-02-10 DIAGNOSIS — I428 Other cardiomyopathies: Secondary | ICD-10-CM

## 2017-02-10 NOTE — Progress Notes (Addendum)
EPIC Encounter for ICM Monitoring  Patient Name: Katherine Rosales is a 66 y.o. female Date: 02/10/2017 Primary Care Physican: Dion Body, MD Primary [place] Electrophysiologist: Faustino Congress Weight: does not weigh        Heart Failure questions reviewed, pt asymptomatic.  She has had to drink a lot of fluids for a kidney scan and then instructions to drink more fluids to flush out contrast.    Optivol: Thoracic impedance abnormal suggesting fluid accumulation but trending toward baseline.  Prescribed dosage: Furosemide 20 mg 1 tablet daily  Labs: 01/25/2017 Creatinine 1.2,   BUN 16, Potassium 4.3, Sodium 142, EGFR 45 - Care Everywhere 11/11/2016 Creatinine 1.4, BUN 26, Potassium 5.1, Sodium 141, EGFR 38 - Care Everywhere  04/27/2016 Creatinine 1.11, BUN 26, Potassium 4.6, Sdoium 141, EGFR 52-60 11/11/2015 Creatinine 1.1, BUN 15, Potassium 4.4, Sodium 143, EGFR 50 - Care Everywhere 08/25/2015 Creatinine 0.9, BUN 17, Potassium 4.5, Sodium 142, EGFR 63 - Care Everywhere 04/09/2015 Creatinine 1.4, BUN 24, Potassium 3.9, Sodium 141, EGFR 38 - Care Everywhere  Recommendations: No changes.  Advised to limit salt intake to 2000 mg/day and fluid intake to < 2 liters/day.  Encouraged to call for fluid symptoms.  Follow-up plan: ICM clinic phone appointment on 03/15/2017.    Copy of ICM check sent to Dr. [name].   3 month ICM trend: 02/10/2017    1 Year ICM trend:       [name], RN 02/10/2017 2:59 PM

## 2017-02-10 NOTE — Progress Notes (Signed)
Remote ICD transmission.   

## 2017-02-11 ENCOUNTER — Encounter: Payer: Self-pay | Admitting: Cardiology

## 2017-02-15 LAB — CUP PACEART REMOTE DEVICE CHECK
Battery Voltage: 3.01 V
Brady Statistic RV Percent Paced: 0.01 %
HighPow Impedance: 89 Ohm
Implantable Lead Implant Date: 20140813
Implantable Lead Location: 753860
Implantable Lead Model: 6935
Lead Channel Impedance Value: 380 Ohm
Lead Channel Impedance Value: 437 Ohm
Lead Channel Setting Pacing Amplitude: 2 V
Lead Channel Setting Pacing Pulse Width: 0.4 ms
Lead Channel Setting Sensing Sensitivity: 0.3 mV
MDC IDC MSMT BATTERY REMAINING LONGEVITY: 91 mo
MDC IDC MSMT LEADCHNL RV PACING THRESHOLD AMPLITUDE: 0.875 V
MDC IDC MSMT LEADCHNL RV PACING THRESHOLD PULSEWIDTH: 0.4 ms
MDC IDC MSMT LEADCHNL RV SENSING INTR AMPL: 13.875 mV
MDC IDC MSMT LEADCHNL RV SENSING INTR AMPL: 13.875 mV
MDC IDC PG IMPLANT DT: 20140813
MDC IDC SESS DTM: 20181108072840

## 2017-03-15 ENCOUNTER — Ambulatory Visit (INDEPENDENT_AMBULATORY_CARE_PROVIDER_SITE_OTHER): Payer: Medicare HMO

## 2017-03-15 DIAGNOSIS — Z95810 Presence of automatic (implantable) cardiac defibrillator: Secondary | ICD-10-CM

## 2017-03-15 DIAGNOSIS — I5022 Chronic systolic (congestive) heart failure: Secondary | ICD-10-CM

## 2017-03-17 NOTE — Progress Notes (Signed)
EPIC Encounter for ICM Monitoring  Patient Name: MKENZIE DOTTS is a 66 y.o. female Date: 03/17/2017 Primary Care Physican: Dion Body, MD Primary [place] Electrophysiologist: Faustino Congress Weight: does not weigh      Heart Failure questions reviewed, pt asymptomatic.   Thoracic impedance abnormal suggesting fluid accumulation since 03/13/2017.  Prescribed dosage: Furosemide 20 mg 1 tablet daily  Labs: 01/25/2017 Creatinine 1.2,   BUN 16, Potassium 4.3, Sodium 142, EGFR 45 - Care Everywhere 11/11/2016 Creatinine 1.4, BUN 26, Potassium 5.1, Sodium 141, EGFR 38 - Care Everywhere  04/27/2016 Creatinine 1.11, BUN 26, Potassium 4.6, Sdoium 141, EGFR 52-60 11/11/2015 Creatinine 1.1, BUN 15, Potassium 4.4, Sodium 143, EGFR 50 - Care Everywhere 08/25/2015 Creatinine 0.9, BUN 17, Potassium 4.5, Sodium 142, EGFR 63 - Care Everywhere 04/09/2015 Creatinine 1.4, BUN 24, Potassium 3.9, Sodium 141, EGFR 38 - Care Everywhere  Recommendations:  Advised to take Furosemide 20 mg 1 tablet twice a day x 2 days and then return to 1 tablet daily.   Follow-up plan: ICM clinic phone appointment on 03/21/2017 to recheck fluid levels.    Copy of ICM check sent to Dr. [name].   3 month ICM trend: 03/15/2017    1 Year ICM trend:       [name], RN 03/17/2017 5:20 PM

## 2017-03-21 ENCOUNTER — Ambulatory Visit (INDEPENDENT_AMBULATORY_CARE_PROVIDER_SITE_OTHER): Payer: Self-pay

## 2017-03-21 DIAGNOSIS — Z95810 Presence of automatic (implantable) cardiac defibrillator: Secondary | ICD-10-CM

## 2017-03-21 DIAGNOSIS — I5022 Chronic systolic (congestive) heart failure: Secondary | ICD-10-CM

## 2017-03-24 NOTE — Progress Notes (Signed)
EPIC Encounter for ICM Monitoring  Patient Name: Katherine Rosales is a 66 y.o. female Date: 03/24/2017 Primary Care Physican: Dion Body, MD Primary [place] Electrophysiologist: Faustino Congress Weight: does not weigh         Transmission received.   Thoracic impedance normal after taking increased Furosemide dosage x 2 days.  Prescribed dosage: Furosemide 20 mg 1 tablet daily  Labs: 01/25/2017 Creatinine 1.2, BUN 16, Potassium 4.3, Sodium 142, EGFR 45 - Care Everywhere 11/11/2016 Creatinine 1.4, BUN 26, Potassium 5.1, Sodium 141, EGFR 38 - Care Everywhere  04/27/2016 Creatinine 1.11, BUN 26, Potassium 4.6, Sdoium 141, EGFR 52-60 11/11/2015 Creatinine 1.1, BUN 15, Potassium 4.4, Sodium 143, EGFR 50 - Care Everywhere 08/25/2015 Creatinine 0.9, BUN 17, Potassium 4.5, Sodium 142, EGFR 63 - Care Everywhere 04/09/2015 Creatinine 1.4, BUN 24, Potassium 3.9, Sodium 141, EGFR 38 - Care Everywhere   Recommendations: None.  Follow-up plan: ICM clinic phone appointment on 04/21/2017.   Copy of ICM check sent to Dr. [name].   3 month ICM trend: 03/21/2017    1 Year ICM trend:       [name], RN 03/24/2017 4:50 PM

## 2017-04-01 ENCOUNTER — Other Ambulatory Visit: Payer: Self-pay | Admitting: Internal Medicine

## 2017-04-01 NOTE — Telephone Encounter (Signed)
Pt has an appointment with Dr. [name] in the [place] office. Please address

## 2017-04-12 DIAGNOSIS — Z0100 Encounter for examination of eyes and vision without abnormal findings: Secondary | ICD-10-CM | POA: Diagnosis not present

## 2017-04-21 ENCOUNTER — Ambulatory Visit (INDEPENDENT_AMBULATORY_CARE_PROVIDER_SITE_OTHER): Payer: Medicare HMO

## 2017-04-21 DIAGNOSIS — Z95810 Presence of automatic (implantable) cardiac defibrillator: Secondary | ICD-10-CM

## 2017-04-21 DIAGNOSIS — I5022 Chronic systolic (congestive) heart failure: Secondary | ICD-10-CM | POA: Diagnosis not present

## 2017-04-22 ENCOUNTER — Telehealth: Payer: Self-pay

## 2017-04-22 NOTE — Progress Notes (Signed)
EPIC Encounter for ICM Monitoring  Patient Name: Katherine Rosales is a 67 y.o. female Date: 04/22/2017 Primary Care Physican: Dion Body, MD Primary [place] Electrophysiologist: Faustino Congress Weight: does not weigh       Attempted call to patient and unable to reach.  Left message to return call.  Transmission reviewed.    Thoracic impedance at normal on 04/21/2017 but was abnormal suggesting fluid accumulation from 04/07/2017 to 04/20/2017.  Prescribed dosage: Furosemide 20 mg 1 tablet daily  Labs: 01/25/2017 Creatinine 1.2, BUN 16, Potassium 4.3, Sodium 142, EGFR 45 - Care Everywhere 11/11/2016 Creatinine 1.4, BUN 26, Potassium 5.1, Sodium 141, EGFR 38 - Care Everywhere  04/27/2016 Creatinine 1.11, BUN 26, Potassium 4.6, Sdoium 141, EGFR 52-60 11/11/2015 Creatinine 1.1, BUN 15, Potassium 4.4, Sodium 143, EGFR 50 - Care Everywhere 08/25/2015 Creatinine 0.9, BUN 17, Potassium 4.5, Sodium 142, EGFR 63 - Care Everywhere 04/09/2015 Creatinine 1.4, BUN 24, Potassium 3.9, Sodium 141, EGFR 38 - Care Everywhere  Recommendations: NONE - Unable to reach.  Follow-up plan: ICM clinic phone appointment on 06/17/2017.  Office appointment scheduled 05/17/2017 with Dr. [name].  Copy of ICM check sent to Dr. [name].   3 month ICM trend: 04/21/2017    1 Year ICM trend:       [name], RN 04/22/2017 9:12 AM

## 2017-04-22 NOTE — Telephone Encounter (Signed)
Remote ICM transmission received.  Attempted call to patient and left message to return call.

## 2017-05-12 ENCOUNTER — Ambulatory Visit (INDEPENDENT_AMBULATORY_CARE_PROVIDER_SITE_OTHER): Payer: Medicare HMO | Admitting: *Deleted

## 2017-05-12 DIAGNOSIS — I428 Other cardiomyopathies: Secondary | ICD-10-CM

## 2017-05-12 NOTE — Progress Notes (Signed)
Remote ICD transmission.   

## 2017-05-17 ENCOUNTER — Encounter: Payer: Medicare HMO | Admitting: Internal Medicine

## 2017-05-17 ENCOUNTER — Encounter: Payer: Self-pay | Admitting: Internal Medicine

## 2017-05-17 ENCOUNTER — Ambulatory Visit: Payer: Medicare HMO | Admitting: Internal Medicine

## 2017-05-17 VITALS — BP 106/60 | HR 66 | Ht 59.0 in | Wt 233.0 lb

## 2017-05-17 DIAGNOSIS — I5022 Chronic systolic (congestive) heart failure: Secondary | ICD-10-CM

## 2017-05-17 DIAGNOSIS — I428 Other cardiomyopathies: Secondary | ICD-10-CM

## 2017-05-17 DIAGNOSIS — Z95810 Presence of automatic (implantable) cardiac defibrillator: Secondary | ICD-10-CM | POA: Diagnosis not present

## 2017-05-17 DIAGNOSIS — I10 Essential (primary) hypertension: Secondary | ICD-10-CM | POA: Diagnosis not present

## 2017-05-17 MED ORDER — FUROSEMIDE 20 MG PO TABS: ORAL_TABLET | ORAL | 1 refills | Status: DC

## 2017-05-17 NOTE — Progress Notes (Signed)
Patient Care Team: Dion Body, MD as PCP - General (Family Medicine)   HPI  Katherine Rosales is a 67 y.o. female Seen in followup for Medtronic  ICD implanted for nonischemic cardiac myopathy and primary prevention. She has chronic systolic failure. She underwent generator replacement in 8/14.     She has known history of chronic hepatitis. Followed locally and at [facility];    Notes reviewed describing " ; she becomes tearful when discussing this, stating that she does not feel like getting out of bed in the morning after losing her father, mother, and brother over the last 3 years "  Comes in today smiling for the first time in years.  Denies breath or chest pain.  Had no edema.  She has 2 pillow orthopnea which is stable. She has a history of sleep apnea.  But is become compliant with her CPAP.  She is resting better. Past Medical History:  Diagnosis Date  . 1027 Lead-Medtronic   . Automatic implantable cardioverter-defibrillator in situ   . Breast cancer ([place]) 03/1995   right breast ca with lumpectomy and chemo and rad  . Cardiomyopathy, nonischemic ([place])   . Diabetes mellitus    Type II Diabetes  . Hypertension   . ICD (implantable cardiac defibrillator), singleMedtronic   . Spleen enlarged   . Systolic heart failure, chronic ([place])     Past Surgical History:  Procedure Laterality Date  . BREAST BIOPSY Right 11/26/2015   - FAT NECROSIS, FIBROSIS, AND CHRONIC INFLAMMATION.   Marland Kitchen BREAST EXCISIONAL BIOPSY Right 03/1995   + breast ca chemo and rad. 9:00 region  . BREAST EXCISIONAL BIOPSY Right 2011   surgical bx neg  11:00 region  . BREAST LUMPECTOMY    . CHOLECYSTECTOMY    . ESOPHAGOGASTRODUODENOSCOPY (EGD) WITH PROPOFOL N/A 06/25/2015   Procedure: ESOPHAGOGASTRODUODENOSCOPY (EGD) WITH PROPOFOL;  Surgeon: Manya Silvas, MD;  Location: [facility] ENDOSCOPY;  Service: Endoscopy;  Laterality: N/A;  . IMPLANTABLE CARDIOVERTER DEFIBRILLATOR (ICD) GENERATOR CHANGE N/A 11/15/2012   Procedure: ICD GENERATOR CHANGE;  Surgeon: Deboraha Sprang, MD;  Location: [facility] CATH LAB;  Service: Cardiovascular;  Laterality: N/A;  . JOINT REPLACEMENT  2009   Total Knee Replacement, Left  . LEAD REVISION N/A 11/15/2012   Procedure: LEAD REVISION;  Surgeon: Deboraha Sprang, MD;  Location: [facility] CATH LAB;  Service: Cardiovascular;  Laterality: N/A;  . PACEMAKER INSERTION  06/30/2005   Medtronic Virtuoso VR ICD - Nonischemic cardiomyopathy potentially related to adriamycin, class 3 congestive failture with a narrow QRS and a negative TDI  . TONSILLECTOMY    . TUBAL LIGATION      Current Outpatient Medications  Medication Sig Dispense Refill  . aspirin 81 MG tablet Take 81 mg by mouth daily.    . Calcium Carbonate-Vitamin D (CALTRATE 600+D) 600-400 MG-UNIT per tablet Take 1 tablet by mouth 2 (two) times daily.      . carvedilol (COREG) 12.5 MG tablet TAKE 1 & 1/2 TABLET BY MOUTH TWICE DAILY WITH FOOD 84 tablet 0  . docusate sodium (COLACE) 100 MG capsule Take 100 mg by mouth 2 (two) times daily.      . furosemide (LASIX) 20 MG tablet Take 1 tablet by mouth daily.    Marland Kitchen gabapentin (NEURONTIN) 600 MG tablet Take 600 mg by mouth 2 (two) times daily.    Marland Kitchen glimepiride (AMARYL) 2 MG tablet Take 2 mg by mouth daily before breakfast.    . levothyroxine (SYNTHROID, LEVOTHROID) 100 MCG tablet Take 100  mcg by mouth.     Marland Kitchen lisinopril (PRINIVIL,ZESTRIL) 20 MG tablet Take 20 mg by mouth daily.      Marland Kitchen loratadine (CLARITIN) 10 MG tablet Take 10 mg by mouth daily.      . Multiple Vitamins-Minerals (CENTRUM SILVER) tablet Take 1 tablet by mouth daily.      Marland Kitchen omeprazole (PRILOSEC) 20 MG capsule Take 20 mg by mouth daily.      . sertraline (ZOLOFT) 100 MG tablet Take 100 mg by mouth 2 (two) times daily. Reported on 06/25/2015    . SYMBICORT 160-4.5 MCG/ACT inhaler Inhale 2 puffs into the lungs as needed.     . citalopram (CELEXA) 20 MG tablet Take 1 tablet by mouth daily. WILL BEGIN TO TAKE AFTER TITRATE DOWN OFF OF  ZOLOFT     No current facility-administered medications for this visit.     No Known Allergies  Review of Systems negative except from HPI and PMH  Physical Exam BP 106/60 (BP Location: Left Arm, Patient Position: Sitting, Cuff Size: Normal)   Pulse 66   Ht 4' 11"  (1.499 m)   Wt 233 lb (105.7 kg)   BMI 47.06 kg/m  Well developed and nourished in no acute distress HENT normal Neck supple with JVP-flat Clear Regular rate and rhythm, no murmurs or gallops Abd-soft with active BS No Clubbing cyanosis edema Skin-warm and dry A & Oriented  Grossly normal sensory and motor function  ECG demonstrates sinus at 66 Intervals 19/10/45 Poor R wave progression Assessment and  Plan  Nonischemic cardiomyopathy  Heart failure Chronic systolic  Hypertension     Depression   PVCs- Trigeminy   Implantable defibrillator-Medtronic  The patient's device was interrogated.  The information was reviewed. No changes were made in the programming.     Sleep apnea  -- compliant  Chronic kidney disease    Euvolemic continue current meds-- followed closely in ICM clinic   Will give Lasix 20 Rx so as to allow prn uptitration   BP well controlled  No intercurrent Ventricular tachycardia  CPAP compliance ahs probably had major effect on mood with better sleep   No PVCs  We spent more than 50% of our >25 min visit in face to face counseling regarding the above

## 2017-05-17 NOTE — Patient Instructions (Addendum)
Medication Instructions: - Your physician recommends that you continue on your current medications as directed. Please refer to the Current Medication list given to you today.  You are being given a prescription for lasix (furosemide) 20 mg to take 1 additional tablet daily as needed for increased swelling/ shortness of breath  Labwork: - none ordered  Procedures/Testing: - none ordered  Follow-Up: - Remote monitoring is used to monitor your Pacemaker of ICD from home. This monitoring reduces the number of office visits required to check your device to one time per year. It allows [place] to keep an eye on the functioning of your device to ensure it is working properly. You are scheduled for a device check from home on 06/17/17. You may send your transmission at any time that day. If you have a wireless device, the transmission will be sent automatically. After your physician reviews your transmission, you will receive a postcard with your next transmission date.   - Your physician wants you to follow-up in: 1 year with Dr. [name]. You will receive a reminder letter in the mail two months in advance. If you don't receive a letter, please call our office to schedule the follow-up appointment.   Any Additional Special Instructions Will Be Listed Below (If Applicable).     If you need a refill on your cardiac medications before your next appointment, please call your pharmacy.

## 2017-05-18 ENCOUNTER — Encounter: Payer: Self-pay | Admitting: Cardiology

## 2017-05-20 DIAGNOSIS — D696 Thrombocytopenia, unspecified: Secondary | ICD-10-CM | POA: Diagnosis not present

## 2017-05-20 DIAGNOSIS — J209 Acute bronchitis, unspecified: Secondary | ICD-10-CM | POA: Diagnosis not present

## 2017-05-20 DIAGNOSIS — E039 Hypothyroidism, unspecified: Secondary | ICD-10-CM | POA: Diagnosis not present

## 2017-05-20 DIAGNOSIS — E1143 Type 2 diabetes mellitus with diabetic autonomic (poly)neuropathy: Secondary | ICD-10-CM | POA: Diagnosis not present

## 2017-05-20 DIAGNOSIS — N183 Chronic kidney disease, stage 3 (moderate): Secondary | ICD-10-CM | POA: Diagnosis not present

## 2017-05-20 DIAGNOSIS — D638 Anemia in other chronic diseases classified elsewhere: Secondary | ICD-10-CM | POA: Diagnosis not present

## 2017-05-20 LAB — CUP PACEART INCLINIC DEVICE CHECK
Date Time Interrogation Session: 20190215170351
Implantable Lead Location: 753860
Implantable Lead Model: 6935
MDC IDC LEAD IMPLANT DT: 20140813
MDC IDC PG IMPLANT DT: 20140813

## 2017-05-26 ENCOUNTER — Other Ambulatory Visit: Payer: Self-pay | Admitting: Internal Medicine

## 2017-06-02 LAB — CUP PACEART REMOTE DEVICE CHECK
Battery Voltage: 3.01 V
Date Time Interrogation Session: 20190207102828
HighPow Impedance: 97 Ohm
Implantable Lead Implant Date: 20140813
Implantable Lead Location: 753860
Implantable Pulse Generator Implant Date: 20140813
Lead Channel Pacing Threshold Amplitude: 0.75 V
Lead Channel Pacing Threshold Pulse Width: 0.4 ms
Lead Channel Sensing Intrinsic Amplitude: 12.25 mV
Lead Channel Setting Pacing Amplitude: 2 V
MDC IDC MSMT BATTERY REMAINING LONGEVITY: 88 mo
MDC IDC MSMT LEADCHNL RV IMPEDANCE VALUE: 342 Ohm
MDC IDC MSMT LEADCHNL RV IMPEDANCE VALUE: 437 Ohm
MDC IDC MSMT LEADCHNL RV SENSING INTR AMPL: 12.25 mV
MDC IDC SET LEADCHNL RV PACING PULSEWIDTH: 0.4 ms
MDC IDC SET LEADCHNL RV SENSING SENSITIVITY: 0.3 mV
MDC IDC STAT BRADY RV PERCENT PACED: 0.01 %

## 2017-06-17 ENCOUNTER — Ambulatory Visit (INDEPENDENT_AMBULATORY_CARE_PROVIDER_SITE_OTHER): Payer: Medicare HMO

## 2017-06-17 DIAGNOSIS — Z95810 Presence of automatic (implantable) cardiac defibrillator: Secondary | ICD-10-CM | POA: Diagnosis not present

## 2017-06-17 DIAGNOSIS — I5022 Chronic systolic (congestive) heart failure: Secondary | ICD-10-CM

## 2017-06-17 NOTE — Progress Notes (Signed)
EPIC Encounter for ICM Monitoring  Patient Name: Katherine Rosales is a 67 y.o. female Date: 06/17/2017 Primary Care Physican: Dion Body, MD Primary [place] Electrophysiologist: Faustino Congress Weight: 233 lbs (last office visit on 05/17/17)       Heart Failure questions reviewed, pt asymptomatic.  She has been eating salty foods and snacks and explained this contributes to fluid retention.  Advised to decrease sodium amount to 2000 mg daily.   Thoracic impedance abnormal suggesting fluid accumulation with progressive worsening since 05/13/2017 with exception of a few days at baseline.  Prescribed dosage: Furosemide 20 mg 1 tablet daily.   Labs: 05/20/2017 Creatinine 1.3,   BUN 25, Potassium 4.4, Sodium 141, EGFR 41 - Care Everywhere 01/25/2017 Creatinine 1.2, BUN 16, Potassium 4.3, Sodium 142, EGFR 45 - Care Everywhere 11/11/2016 Creatinine 1.4, BUN 26, Potassium 5.1, Sodium 141, EGFR 38 - Care Everywhere  04/27/2016 Creatinine 1.11, BUN 26, Potassium 4.6, Sdoium 141, EGFR 52-60 11/11/2015 Creatinine 1.1, BUN 15, Potassium 4.4, Sodium 143, EGFR 50 - Care Everywhere 08/25/2015 Creatinine 0.9, BUN 17, Potassium 4.5, Sodium 142, EGFR 63 - Care Everywhere 04/09/2015 Creatinine 1.4, BUN 24, Potassium 3.9, Sodium 141, EGFR 38 - Care Everywhere  Recommendations: No changes. Advised to take 1 additional tablet of her extra PRN Furosemide for total of 40 mg daily x 3 days.  After 3rd day then return to 20 mg daily.    Follow-up plan: ICM clinic phone appointment on 06/20/2017 (manual send) to recheck fluid levels.    Copy of ICM check sent to Dr. [name].   3 month ICM trend: 06/17/2017    1 Year ICM trend:       [name], RN 06/17/2017 11:53 AM

## 2017-06-20 ENCOUNTER — Ambulatory Visit (INDEPENDENT_AMBULATORY_CARE_PROVIDER_SITE_OTHER): Payer: Self-pay

## 2017-06-20 ENCOUNTER — Telehealth: Payer: Self-pay | Admitting: Cardiology

## 2017-06-20 DIAGNOSIS — Z95810 Presence of automatic (implantable) cardiac defibrillator: Secondary | ICD-10-CM

## 2017-06-20 DIAGNOSIS — I5022 Chronic systolic (congestive) heart failure: Secondary | ICD-10-CM

## 2017-06-20 NOTE — Telephone Encounter (Signed)
LMOVM reminding pt to send remote transmission.   

## 2017-06-21 NOTE — Progress Notes (Signed)
EPIC Encounter for ICM Monitoring  Patient Name: Katherine Rosales is a 67 y.o. female Date: 06/21/2017 Primary Care Physican: Dion Body, MD Primary [place] Electrophysiologist: Faustino Congress Weight:does not weigh      Heart Failure questions reviewed, pt asymptomatic.   Thoracic impedance returned to normal after taking extra PRN Furosemide.  Prescribed dosage: Furosemide 20 mg 1 tablet daily.   Labs: 05/20/2017 Creatinine 1.3,   BUN 25, Potassium 4.4, Sodium 141, EGFR 41 - Care Everywhere 01/25/2017 Creatinine 1.2, BUN 16, Potassium 4.3, Sodium 142, EGFR 45 - Care Everywhere 11/11/2016 Creatinine 1.4, BUN 26, Potassium 5.1, Sodium 141, EGFR 38 - Care Everywhere  04/27/2016 Creatinine 1.11, BUN 26, Potassium 4.6, Sdoium 141, EGFR 52-60 11/11/2015 Creatinine 1.1, BUN 15, Potassium 4.4, Sodium 143, EGFR 50 - Care Everywhere 08/25/2015 Creatinine 0.9, BUN 17, Potassium 4.5, Sodium 142, EGFR 63 - Care Everywhere 04/09/2015 Creatinine 1.4, BUN 24, Potassium 3.9, Sodium 141, EGFR 38 - Care Everywhere  Recommendations: No changes.  Encouraged to call for fluid symptoms.  Follow-up plan: ICM clinic phone appointment on 07/18/2017.  Copy of ICM check sent to Dr. [name].   3 month ICM trend: 06/20/2017    1 Year ICM trend:       [name], RN 06/21/2017 2:31 PM

## 2017-07-18 ENCOUNTER — Ambulatory Visit (INDEPENDENT_AMBULATORY_CARE_PROVIDER_SITE_OTHER): Payer: Self-pay

## 2017-07-18 DIAGNOSIS — Z95810 Presence of automatic (implantable) cardiac defibrillator: Secondary | ICD-10-CM

## 2017-07-18 DIAGNOSIS — I5022 Chronic systolic (congestive) heart failure: Secondary | ICD-10-CM

## 2017-07-19 NOTE — Progress Notes (Signed)
  No ICM remote transmission received.

## 2017-07-22 NOTE — Progress Notes (Signed)
No ICM remote transmission received for 07/18/2017 and next ICM transmission scheduled for 08/11/2017.

## 2017-07-26 DIAGNOSIS — K7581 Nonalcoholic steatohepatitis (NASH): Secondary | ICD-10-CM | POA: Diagnosis not present

## 2017-07-26 DIAGNOSIS — Z86010 Personal history of colonic polyps: Secondary | ICD-10-CM | POA: Diagnosis not present

## 2017-07-26 DIAGNOSIS — K7460 Unspecified cirrhosis of liver: Secondary | ICD-10-CM | POA: Diagnosis not present

## 2017-07-27 ENCOUNTER — Other Ambulatory Visit: Payer: Self-pay | Admitting: Student

## 2017-07-27 DIAGNOSIS — K7581 Nonalcoholic steatohepatitis (NASH): Principal | ICD-10-CM

## 2017-07-27 DIAGNOSIS — K7460 Unspecified cirrhosis of liver: Secondary | ICD-10-CM

## 2017-07-28 ENCOUNTER — Telehealth: Payer: Self-pay

## 2017-07-28 NOTE — Telephone Encounter (Signed)
   [facility] Pre-operative Risk Assessment    Request for surgical clearance:  1. What type of surgery is being performed? Colonoscopy   2. When is this surgery scheduled? 10/03/2017   3. What type of clearance is required (medical clearance vs. Pharmacy clearance to hold med vs. Both)? Both  4. Are there any medications that need to be held prior to surgery and how long? Aspirin, Day of Procedure  Practice name and name of physician performing surgery? [place], [place]  5. What is your office phone number [phone]    7.   What is your office fax number [phone]  8.   Anesthesia type (None, local, MAC, general) ? Monitored anesthesia   Joaquim Lai 07/28/2017, 1:23 PM  _________________________________________________________________   (provider comments below)

## 2017-07-28 NOTE — Telephone Encounter (Signed)
   Primary Cardiologist: [name], MD  Chart reviewed as part of pre-operative protocol coverage. Patient has history of NICM, chronic systolic CHF, ICD, breast CA, DM, HTN. At last OV 05/2017 was doing well with Dr. [name] except prescribed PRN Lasix - has been followed in remote ICM clinic but did not receive her April transmission to evaluate volume status (which has periodically required the Lasix). Callback staff, can you please touch base with device clinic for them to review? This would be helpful information before clearing.   Charlie Pitter, PA-C 07/28/2017, 4:54 PM

## 2017-07-29 NOTE — Telephone Encounter (Signed)
Call to patient and advised no ICM remote report received 07/18/2017 and rescheduled for 08/11/2017.  She denied any fluid symptoms at this time.  Encouraged to call for any fluid symptoms.

## 2017-08-01 NOTE — Telephone Encounter (Signed)
S/w pt is aware too send in remote on 5/9 and to hold ASA the day of procedure per Truitt Merle, NP.

## 2017-08-01 NOTE — Telephone Encounter (Signed)
Patient will be doing remote ICD transmission on 08/11/2017.  Should be ok to hold aspirin day of procedure as requested.   Burtis Junes, RN, [place] [address] [place],   [phone]

## 2017-08-02 ENCOUNTER — Ambulatory Visit
Admission: RE | Admit: 2017-08-02 | Discharge: 2017-08-02 | Disposition: A | Discharge status: Home / Self Care | Payer: Medicare HMO | Source: Ambulatory Visit | Attending: Student | Admitting: Student

## 2017-08-02 DIAGNOSIS — Q619 Cystic kidney disease, unspecified: Secondary | ICD-10-CM | POA: Insufficient documentation

## 2017-08-02 DIAGNOSIS — K7581 Nonalcoholic steatohepatitis (NASH): Secondary | ICD-10-CM | POA: Diagnosis not present

## 2017-08-02 DIAGNOSIS — K7460 Unspecified cirrhosis of liver: Secondary | ICD-10-CM | POA: Insufficient documentation

## 2017-08-02 DIAGNOSIS — Z9049 Acquired absence of other specified parts of digestive tract: Secondary | ICD-10-CM | POA: Insufficient documentation

## 2017-08-11 ENCOUNTER — Telehealth: Payer: Self-pay | Admitting: Cardiology

## 2017-08-11 ENCOUNTER — Encounter: Payer: Medicare HMO | Admitting: *Deleted

## 2017-08-11 NOTE — Telephone Encounter (Signed)
Spoke with pt and reminded pt of remote transmission that is due today. Pt verbalized understanding.   

## 2017-08-12 NOTE — Progress Notes (Signed)
No ICM remote transmission received for 08/11/2017 and next ICM transmission scheduled for 09/01/2017.

## 2017-08-18 ENCOUNTER — Other Ambulatory Visit: Payer: Self-pay | Admitting: Internal Medicine

## 2017-08-23 ENCOUNTER — Ambulatory Visit (INDEPENDENT_AMBULATORY_CARE_PROVIDER_SITE_OTHER): Payer: Medicare HMO

## 2017-08-23 DIAGNOSIS — I5022 Chronic systolic (congestive) heart failure: Secondary | ICD-10-CM

## 2017-08-23 DIAGNOSIS — Z95810 Presence of automatic (implantable) cardiac defibrillator: Secondary | ICD-10-CM | POA: Diagnosis not present

## 2017-08-23 NOTE — Progress Notes (Signed)
EPIC Encounter for ICM Monitoring  Patient Name: Katherine Rosales is a 67 y.o. female Date: 08/23/2017 Primary Care Physican: Katherine Body, MD Primary [place] Electrophysiologist: Katherine Rosales Weight:does not weigh            Heart Failure questions reviewed, pt asymptomatic.   Thoracic impedance abnormal suggesting fluid accumulation and starting to trend toward baseline.  Prescribed dosage: Furosemide 20 mg 1 tablet daily and PRN Furosemide if needed.  Labs: 05/20/2017 Creatinine 1.3, BUN 25, Potassium 4.4, Sodium 141, EGFR 41 - Care Everywhere 01/25/2017 Creatinine 1.2, BUN 16, Potassium 4.3, Sodium 142, EGFR 45 - Care Everywhere 11/11/2016 Creatinine 1.4, BUN 26, Potassium 5.1, Sodium 141, EGFR 38 - Care Everywhere  04/27/2016 Creatinine 1.11, BUN 26, Potassium 4.6, Sdoium 141, EGFR 52-60 11/11/2015 Creatinine 1.1, BUN 15, Potassium 4.4, Sodium 143, EGFR 50 - Care Everywhere 08/25/2015 Creatinine 0.9, BUN 17, Potassium 4.5, Sodium 142, EGFR 63 - Care Everywhere 04/09/2015 Creatinine 1.4, BUN 24, Potassium 3.9, Sodium 141, EGFR 38 - Care Everywhere  Recommendations:  Advised to take PRN Furosemide 1 tablet x 2 days and then return to PRN.   Follow-up plan: ICM clinic phone appointment on 09/08/2017 to recheck fluid levels.    Copy of ICM check sent to Dr. Caryl Rosales.   3 month ICM trend: 08/23/2017    1 Year ICM trend:       Katherine Billings, RN 08/23/2017 11:42 AM

## 2017-09-08 ENCOUNTER — Ambulatory Visit (INDEPENDENT_AMBULATORY_CARE_PROVIDER_SITE_OTHER): Payer: Self-pay

## 2017-09-08 DIAGNOSIS — I5022 Chronic systolic (congestive) heart failure: Secondary | ICD-10-CM

## 2017-09-08 DIAGNOSIS — Z95810 Presence of automatic (implantable) cardiac defibrillator: Secondary | ICD-10-CM

## 2017-09-09 ENCOUNTER — Telehealth: Payer: Self-pay

## 2017-09-09 NOTE — Progress Notes (Signed)
EPIC Encounter for ICM Monitoring  Patient Name: Katherine Rosales is a 67 y.o. female Date: 09/09/2017 Primary Care Physican: Dion Body, MD Primary [place] Electrophysiologist: Faustino Congress Weight:does not weigh       Attempted call to patient and unable to reach.  Left message to return call.  Transmission reviewed.    Thoracic impedance returned to normal since last ICM transmission on 08/23/2017.  Prescribed dosage: Furosemide 20 mg 1 tablet daily and PRN Furosemide if needed.  Labs: 07/26/2017 Creatinine 1.1,   BUN 26, Potassium 4.3, Sodium 142, EGFR 50 - Care Everywhere 05/20/2017 Creatinine 1.3, BUN 25, Potassium 4.4, Sodium 141, EGFR 41 - Care Everywhere 01/25/2017 Creatinine 1.2, BUN 16, Potassium 4.3, Sodium 142, EGFR 45 - Care Everywhere 11/11/2016 Creatinine 1.4, BUN 26, Potassium 5.1, Sodium 141, EGFR 38 - Care Everywhere  04/27/2016 Creatinine 1.11, BUN 26, Potassium 4.6, Sdoium 141, EGFR 52-60  Recommendations: NONE - Unable to reach.  Follow-up plan: ICM clinic phone appointment on 09/29/2017.   Recall appt 05/17/2018 with Dr. [name].  Copy of ICM check sent to Dr. [name].   3 month ICM trend: 09/08/2017    1 Year ICM trend:       [name], RN 09/09/2017 8:40 AM

## 2017-09-09 NOTE — Progress Notes (Signed)
Patient returned call and stated she is feeling good.  Reviewed transmission and advised returned to normal.  No changes today.  Next ICM remote transmission 09/29/2017

## 2017-09-09 NOTE — Telephone Encounter (Signed)
Remote ICM transmission received.  Attempted call to patient and left message, per DPR, to return call.

## 2017-09-29 ENCOUNTER — Ambulatory Visit (INDEPENDENT_AMBULATORY_CARE_PROVIDER_SITE_OTHER): Payer: Medicare HMO

## 2017-09-29 DIAGNOSIS — Z95810 Presence of automatic (implantable) cardiac defibrillator: Secondary | ICD-10-CM | POA: Diagnosis not present

## 2017-09-29 DIAGNOSIS — I5022 Chronic systolic (congestive) heart failure: Secondary | ICD-10-CM

## 2017-09-29 NOTE — Progress Notes (Signed)
EPIC Encounter for ICM Monitoring  Patient Name: Katherine Rosales is a 67 y.o. female Date: 09/29/2017 Primary Care Physican: Dion Body, MD Primary [place] Electrophysiologist: Faustino Congress Weight:does not weigh        Heart Failure questions reviewed, pt asymptomatic.  Pt scheduled for colonoscopy on 10/03/2017   Thoracic impedance abnormal suggesting fluid accumulation starting 09/25/2017.  Prescribed dosage: Furosemide 20 mg 1 tablet dailyand PRN Furosemide if needed.  Labs: 07/26/2017 Creatinine 1.1,   BUN 26, Potassium 4.3, Sodium 142, EGFR 50 - Care Everywhere 05/20/2017 Creatinine 1.3, BUN 25, Potassium 4.4, Sodium 141, EGFR 41 - Care Everywhere 01/25/2017 Creatinine 1.2, BUN 16, Potassium 4.3, Sodium 142, EGFR 45 - Care Everywhere 11/11/2016 Creatinine 1.4, BUN 26, Potassium 5.1, Sodium 141, EGFR 38 - Care Everywhere  04/27/2016 Creatinine 1.11, BUN 26, Potassium 4.6, Sdoium 141, EGFR 52-60  Recommendations: No changes since she will be having colonoscopy prep this weekend.  Advised if she has any fluid symptoms she has the PRN Furosemide she can take. Encouraged to call for fluid symptoms.  Follow-up plan: ICM clinic phone appointment on 10/04/2017 to recheck fluid levels.    Copy of ICM check sent to Dr. [name].    3 month ICM trend: 09/29/2017    1 Year ICM trend:       [name], RN 09/29/2017 3:57 PM

## 2017-10-03 ENCOUNTER — Encounter
Admission: RE | Disposition: A | Discharge status: Home / Self Care | Payer: Self-pay | Source: Ambulatory Visit | Attending: Unknown Physician Specialty

## 2017-10-03 ENCOUNTER — Ambulatory Visit: Payer: Medicare HMO | Admitting: Anesthesiology

## 2017-10-03 ENCOUNTER — Encounter: Payer: Self-pay | Admitting: Anesthesiology

## 2017-10-03 ENCOUNTER — Ambulatory Visit
Admission: RE | Admit: 2017-10-03 | Discharge: 2017-10-03 | Disposition: A | Discharge status: Home / Self Care | Payer: Medicare HMO | Source: Ambulatory Visit | Attending: Unknown Physician Specialty | Admitting: Unknown Physician Specialty

## 2017-10-03 DIAGNOSIS — Z96652 Presence of left artificial knee joint: Secondary | ICD-10-CM | POA: Diagnosis not present

## 2017-10-03 DIAGNOSIS — Z9221 Personal history of antineoplastic chemotherapy: Secondary | ICD-10-CM | POA: Diagnosis not present

## 2017-10-03 DIAGNOSIS — I428 Other cardiomyopathies: Secondary | ICD-10-CM | POA: Diagnosis not present

## 2017-10-03 DIAGNOSIS — Z90710 Acquired absence of both cervix and uterus: Secondary | ICD-10-CM | POA: Insufficient documentation

## 2017-10-03 DIAGNOSIS — Z86010 Personal history of colonic polyps: Secondary | ICD-10-CM | POA: Insufficient documentation

## 2017-10-03 DIAGNOSIS — Z79899 Other long term (current) drug therapy: Secondary | ICD-10-CM | POA: Diagnosis not present

## 2017-10-03 DIAGNOSIS — D649 Anemia, unspecified: Secondary | ICD-10-CM | POA: Insufficient documentation

## 2017-10-03 DIAGNOSIS — G4730 Sleep apnea, unspecified: Secondary | ICD-10-CM | POA: Diagnosis not present

## 2017-10-03 DIAGNOSIS — Z853 Personal history of malignant neoplasm of breast: Secondary | ICD-10-CM | POA: Diagnosis not present

## 2017-10-03 DIAGNOSIS — N183 Chronic kidney disease, stage 3 (moderate): Secondary | ICD-10-CM | POA: Insufficient documentation

## 2017-10-03 DIAGNOSIS — Z1211 Encounter for screening for malignant neoplasm of colon: Secondary | ICD-10-CM | POA: Insufficient documentation

## 2017-10-03 DIAGNOSIS — Z7982 Long term (current) use of aspirin: Secondary | ICD-10-CM | POA: Insufficient documentation

## 2017-10-03 DIAGNOSIS — K640 First degree hemorrhoids: Secondary | ICD-10-CM | POA: Diagnosis not present

## 2017-10-03 DIAGNOSIS — E1122 Type 2 diabetes mellitus with diabetic chronic kidney disease: Secondary | ICD-10-CM | POA: Insufficient documentation

## 2017-10-03 DIAGNOSIS — F329 Major depressive disorder, single episode, unspecified: Secondary | ICD-10-CM | POA: Diagnosis not present

## 2017-10-03 DIAGNOSIS — Z923 Personal history of irradiation: Secondary | ICD-10-CM | POA: Diagnosis not present

## 2017-10-03 DIAGNOSIS — K648 Other hemorrhoids: Secondary | ICD-10-CM | POA: Diagnosis not present

## 2017-10-03 DIAGNOSIS — Z95810 Presence of automatic (implantable) cardiac defibrillator: Secondary | ICD-10-CM | POA: Diagnosis not present

## 2017-10-03 DIAGNOSIS — Z8042 Family history of malignant neoplasm of prostate: Secondary | ICD-10-CM | POA: Diagnosis not present

## 2017-10-03 DIAGNOSIS — F419 Anxiety disorder, unspecified: Secondary | ICD-10-CM | POA: Insufficient documentation

## 2017-10-03 DIAGNOSIS — I130 Hypertensive heart and chronic kidney disease with heart failure and stage 1 through stage 4 chronic kidney disease, or unspecified chronic kidney disease: Secondary | ICD-10-CM | POA: Diagnosis not present

## 2017-10-03 DIAGNOSIS — E039 Hypothyroidism, unspecified: Secondary | ICD-10-CM | POA: Insufficient documentation

## 2017-10-03 DIAGNOSIS — I110 Hypertensive heart disease with heart failure: Secondary | ICD-10-CM | POA: Diagnosis not present

## 2017-10-03 DIAGNOSIS — I5022 Chronic systolic (congestive) heart failure: Secondary | ICD-10-CM | POA: Diagnosis not present

## 2017-10-03 HISTORY — DX: Major depressive disorder, single episode, unspecified: F32.9

## 2017-10-03 HISTORY — PX: COLONOSCOPY WITH PROPOFOL: SHX5780

## 2017-10-03 HISTORY — DX: Chronic kidney disease, unspecified: N18.9

## 2017-10-03 HISTORY — DX: Depression, unspecified: F32.A

## 2017-10-03 HISTORY — DX: Anemia, unspecified: D64.9

## 2017-10-03 HISTORY — DX: Anxiety disorder, unspecified: F41.9

## 2017-10-03 HISTORY — DX: Hypothyroidism, unspecified: E03.9

## 2017-10-03 HISTORY — DX: Sleep apnea, unspecified: G47.30

## 2017-10-03 LAB — GLUCOSE, CAPILLARY: Glucose-Capillary: 116 mg/dL — ABNORMAL HIGH (ref 70–99)

## 2017-10-03 SURGERY — COLONOSCOPY WITH PROPOFOL
Anesthesia: General

## 2017-10-03 MED ORDER — PROPOFOL 500 MG/50ML IV EMUL
INTRAVENOUS | Status: AC
  Filled 2017-10-03: qty 50

## 2017-10-03 MED ORDER — PROPOFOL 500 MG/50ML IV EMUL
INTRAVENOUS | Status: DC | PRN
  Administered 2017-10-03: 180 ug/kg/min via INTRAVENOUS

## 2017-10-03 MED ORDER — PIPERACILLIN-TAZOBACTAM 3.375 G IVPB 30 MIN
3.3750 g | Freq: Once | INTRAVENOUS | Status: AC
  Administered 2017-10-03: 3.375 g via INTRAVENOUS
  Filled 2017-10-03: qty 50

## 2017-10-03 MED ORDER — PHENYLEPHRINE HCL 10 MG/ML IJ SOLN
INTRAMUSCULAR | Status: DC | PRN
  Administered 2017-10-03: 100 ug via INTRAVENOUS

## 2017-10-03 MED ORDER — EPHEDRINE SULFATE 50 MG/ML IJ SOLN
INTRAMUSCULAR | Status: DC | PRN
  Administered 2017-10-03: 10 mg via INTRAVENOUS

## 2017-10-03 MED ORDER — SODIUM CHLORIDE 0.9 % IV SOLN: INTRAVENOUS | Status: DC

## 2017-10-03 MED ORDER — MIDAZOLAM HCL 2 MG/2ML IJ SOLN
INTRAMUSCULAR | Status: AC
  Filled 2017-10-03: qty 2

## 2017-10-03 MED ORDER — EPHEDRINE SULFATE 50 MG/ML IJ SOLN
INTRAMUSCULAR | Status: AC
  Filled 2017-10-03: qty 1

## 2017-10-03 MED ORDER — MIDAZOLAM HCL 2 MG/2ML IJ SOLN
INTRAMUSCULAR | Status: DC | PRN
  Administered 2017-10-03: 1 mg via INTRAVENOUS

## 2017-10-03 MED ORDER — FENTANYL CITRATE (PF) 100 MCG/2ML IJ SOLN
INTRAMUSCULAR | Status: AC
  Filled 2017-10-03: qty 2

## 2017-10-03 MED ORDER — FENTANYL CITRATE (PF) 100 MCG/2ML IJ SOLN
INTRAMUSCULAR | Status: DC | PRN
  Administered 2017-10-03: 50 ug via INTRAVENOUS

## 2017-10-03 MED ORDER — PIPERACILLIN-TAZOBACTAM 3.375 G IVPB
INTRAVENOUS | Status: AC
  Filled 2017-10-03: qty 50

## 2017-10-03 MED ORDER — PROPOFOL 10 MG/ML IV BOLUS
INTRAVENOUS | Status: DC | PRN
  Administered 2017-10-03: 50 mg via INTRAVENOUS

## 2017-10-03 MED ORDER — SODIUM CHLORIDE 0.9 % IV SOLN
INTRAVENOUS | Status: DC
  Administered 2017-10-03 (×2): via INTRAVENOUS

## 2017-10-03 MED ORDER — PHENYLEPHRINE HCL 10 MG/ML IJ SOLN
INTRAMUSCULAR | Status: AC
  Filled 2017-10-03: qty 1

## 2017-10-03 MED ORDER — LIDOCAINE HCL (PF) 2 % IJ SOLN
INTRAMUSCULAR | Status: AC
  Filled 2017-10-03: qty 10

## 2017-10-03 NOTE — Anesthesia Postprocedure Evaluation (Signed)
Anesthesia Post Note  Patient: Katherine Rosales  Procedure(s) Performed: COLONOSCOPY WITH PROPOFOL (N/A )  Patient location during evaluation: Endoscopy Anesthesia Type: General Level of consciousness: awake and alert Pain management: pain level controlled Vital Signs Assessment: post-procedure vital signs reviewed and stable Respiratory status: spontaneous breathing, nonlabored ventilation, respiratory function stable and patient connected to nasal cannula oxygen Cardiovascular status: blood pressure returned to baseline and stable Postop Assessment: no apparent nausea or vomiting Anesthetic complications: no     Last Vitals:  Vitals:   10/03/17 1115 10/03/17 1125  BP: (!) 97/54 (!) 105/49  Pulse: 71 69  Resp: 15 18  Temp:    SpO2: 99% 98%    Last Pain:  Vitals:   10/03/17 1125  PainSc: 0-No pain                 Stowell,Carlin Mamone S

## 2017-10-03 NOTE — Anesthesia Procedure Notes (Signed)
Date/Time: 10/03/2017 10:40 AM Performed by: Allean Found, CRNA Pre-anesthesia Checklist: Emergency Drugs available, Suction available, Patient identified, Patient being monitored and Timeout performed Patient Re-evaluated:Patient Re-evaluated prior to induction Oxygen Delivery Method: Nasal cannula Placement Confirmation: positive ETCO2

## 2017-10-03 NOTE — Transfer of Care (Signed)
Immediate Anesthesia Transfer of Care Note  Patient: Katherine Rosales  Procedure(s) Performed: COLONOSCOPY WITH PROPOFOL (N/A )  Patient Location: PACU  Anesthesia Type:General  Level of Consciousness: awake  Airway & Oxygen Therapy: Patient Spontanous Breathing and Patient connected to nasal cannula oxygen  Post-op Assessment: Report given to RN and Post -op Vital signs reviewed and stable  Post vital signs: Reviewed and stable  Last Vitals:  Vitals Value Taken Time  BP    Temp    Pulse    Resp    SpO2      Last Pain: There were no vitals filed for this visit.       Complications: No apparent anesthesia complications

## 2017-10-03 NOTE — Op Note (Signed)
[facility] Gastroenterology Patient Name: Katherine Rosales Procedure Date: 10/03/2017 10:23 AM MRN: [id number] Account #: [id number] Date of Birth: 1950/11/30 Admit Type: Outpatient Age: 67 Room: [facility] ENDO ROOM 1 Gender: Female Note Status: Finalized Procedure:            Colonoscopy Indications:          High risk colon cancer surveillance: Personal history                        of colonic polyps Providers:            Manya Silvas, MD Referring MD:         Dion Body (Referring MD) Medicines:            Propofol per Anesthesia Complications:        No immediate complications. Procedure:            Pre-Anesthesia Assessment:                       - After reviewing the risks and benefits, the patient                        was deemed in satisfactory condition to undergo the                        procedure.                       After obtaining informed consent, the colonoscope was                        passed under direct vision. Throughout the procedure,                        the patient's blood pressure, pulse, and oxygen                        saturations were monitored continuously. The                        Colonoscope was introduced through the anus and                        advanced to the the cecum, identified by appendiceal                        orifice and ileocecal valve. The colonoscopy was                        somewhat difficult. The patient tolerated the procedure                        well. The quality of the bowel preparation was adequate                        to identify polyps 6 mm and larger in size. Findings:      Internal hemorrhoids were found during endoscopy. The hemorrhoids were       small and Grade I (internal hemorrhoids that do not prolapse). No stool       except food debris.      The  exam was otherwise without abnormality. There was small debris of       stool with tomato like pieces. Impression:           -  Internal hemorrhoids.                       - The examination was otherwise normal.                       - No specimens collected. Recommendation:       - The findings and recommendations were discussed with                        the patient's family. Manya Silvas, MD 10/03/2017 10:58:35 AM This report has been signed electronically. Number of Addenda: 0 Note Initiated On: 10/03/2017 10:23 AM Scope Withdrawal Time: 0 hours 4 minutes 16 seconds  Total Procedure Duration: 0 hours 15 minutes 28 seconds       [facility]

## 2017-10-03 NOTE — Anesthesia Preprocedure Evaluation (Signed)
Anesthesia Evaluation  Patient identified by MRN, date of birth, ID band Patient awake    Reviewed: Allergy & Precautions, NPO status , Patient's Chart, lab work & pertinent test results, reviewed documented beta blocker date and time   Airway Mallampati: III  TM Distance: >3 FB     Dental  (+) Chipped   Pulmonary sleep apnea and Continuous Positive Airway Pressure Ventilation ,           Cardiovascular hypertension, Pt. on medications and Pt. on home beta blockers +CHF  + Cardiac Defibrillator      Neuro/Psych PSYCHIATRIC DISORDERS Anxiety Depression    GI/Hepatic   Endo/Other  diabetes, Type 2Hypothyroidism Morbid obesity  Renal/GU Renal disease     Musculoskeletal   Abdominal   Peds  Hematology  (+) anemia ,   Anesthesia Other Findings   Reproductive/Obstetrics                             Anesthesia Physical Anesthesia Plan  ASA: III  Anesthesia Plan: General   Post-op Pain Management:    Induction: Intravenous  PONV Risk Score and Plan:   Airway Management Planned:   Additional Equipment:   Intra-op Plan:   Post-operative Plan:   Informed Consent: I have reviewed the patients History and Physical, chart, labs and discussed the procedure including the risks, benefits and alternatives for the proposed anesthesia with the patient or authorized representative who has indicated his/her understanding and acceptance.     Plan Discussed with: CRNA  Anesthesia Plan Comments:         Anesthesia Quick Evaluation

## 2017-10-03 NOTE — H&P (Signed)
Primary Care Physician:  Dion Body, MD Primary Gastroenterologist:  Dr. Vira Agar  Pre-Procedure History & Physical: HPI:  Katherine Rosales is a 67 y.o. female is here for an colonoscopy.  For personal history of colon polyps.   Past Medical History:  Diagnosis Date  . 7858 Lead-Medtronic   . Anemia 11/12/2016  . Anxiety 12/25/2013  . Automatic implantable cardioverter-defibrillator in situ   . Breast cancer ([place]) 03/1995   right breast ca with lumpectomy and chemo and rad  . Cardiomyopathy, nonischemic ([place])   . Chronic kidney disease 11/12/2016   stage 3  . Depression   . Diabetes mellitus    Type II Diabetes  . Hypertension   . Hypothyroidism   . ICD (implantable cardiac defibrillator), singleMedtronic   . Sleep apnea    On CPAP  . Spleen enlarged   . Systolic heart failure, chronic ([place])     Past Surgical History:  Procedure Laterality Date  . ABDOMINAL HYSTERECTOMY    . BREAST BIOPSY Right 11/26/2015   - FAT NECROSIS, FIBROSIS, AND CHRONIC INFLAMMATION.   Marland Kitchen BREAST EXCISIONAL BIOPSY Right 03/1995   + breast ca chemo and rad. 9:00 region  . BREAST EXCISIONAL BIOPSY Right 2011   surgical bx neg  11:00 region  . BREAST LUMPECTOMY    . CHOLECYSTECTOMY    . COLONOSCOPY  03/05/2013  . COLONOSCOPY W/ POLYPECTOMY  03/05/2013  . ESOPHAGOGASTRODUODENOSCOPY  01/11/2003, 06/25/2015  . ESOPHAGOGASTRODUODENOSCOPY (EGD) WITH PROPOFOL N/A 06/25/2015   Procedure: ESOPHAGOGASTRODUODENOSCOPY (EGD) WITH PROPOFOL;  Surgeon: Manya Silvas, MD;  Location: [facility] ENDOSCOPY;  Service: Endoscopy;  Laterality: N/A;  . FOOT SURGERY Right   . IMPLANTABLE CARDIOVERTER DEFIBRILLATOR (ICD) GENERATOR CHANGE N/A 11/15/2012   Procedure: ICD GENERATOR CHANGE;  Surgeon: Deboraha Sprang, MD;  Location: [facility] CATH LAB;  Service: Cardiovascular;  Laterality: N/A;  . JOINT REPLACEMENT  2009   Total Knee Replacement, Left  . LEAD REVISION N/A 11/15/2012   Procedure: LEAD REVISION;  Surgeon: Deboraha Sprang, MD;  Location: [facility] CATH LAB;  Service: Cardiovascular;  Laterality: N/A;  . PACEMAKER INSERTION  06/30/2005   Medtronic Virtuoso VR ICD - Nonischemic cardiomyopathy potentially related to adriamycin, class 3 congestive failture with a narrow QRS and a negative TDI  . TONSILLECTOMY    . TUBAL LIGATION      Prior to Admission medications   Medication Sig Start Date End Date Taking? Authorizing Provider  aspirin 81 MG tablet Take 81 mg by mouth daily.   Yes [provider]  Calcium Carbonate-Vitamin D (CALTRATE 600+D) 600-400 MG-UNIT per tablet Take 1 tablet by mouth 2 (two) times daily.     Yes [provider]  carvedilol (COREG) 12.5 MG tablet TAKE 1 & 1/2 TABLET BY MOUTH TWICE DAILY WITH FOOD 08/18/17  Yes Deboraha Sprang, MD  docusate sodium (COLACE) 100 MG capsule Take 100 mg by mouth 2 (two) times daily.     Yes [provider]  furosemide (LASIX) 20 MG tablet Take 1 tablet by mouth daily. 08/30/14  Yes [provider]  furosemide (LASIX) 20 MG tablet Take 1 tablet (20 mg) daily as needed 05/17/17  Yes Deboraha Sprang, MD  gabapentin (NEURONTIN) 600 MG tablet Take 600 mg by mouth 2 (two) times daily.   Yes [provider]  levothyroxine (SYNTHROID, LEVOTHROID) 100 MCG tablet Take 100 mcg by mouth.  09/06/14  Yes [provider]  lisinopril (PRINIVIL,ZESTRIL) 20 MG tablet Take 20 mg by mouth  daily.     Yes [provider]  loratadine (CLARITIN) 10 MG tablet Take 10 mg by mouth daily.     Yes [provider]  Multiple Vitamins-Minerals (CENTRUM SILVER) tablet Take 1 tablet by mouth daily.     Yes [provider]  omeprazole (PRILOSEC) 20 MG capsule Take 20 mg by mouth daily.     Yes [provider]  sertraline (ZOLOFT) 100 MG tablet Take 100 mg by mouth 2 (two) times daily. Reported on 06/25/2015   Yes [provider]  SYMBICORT 160-4.5 MCG/ACT inhaler Inhale 2 puffs into the lungs as needed.   08/27/14  Yes [provider]  citalopram (CELEXA) 20 MG tablet Take 1 tablet by mouth daily. WILL BEGIN TO TAKE AFTER TITRATE DOWN OFF OF ZOLOFT 04/16/15 10/18/16  [provider]  glimepiride (AMARYL) 2 MG tablet Take 2 mg by mouth daily before breakfast.    [provider]    Allergies as of 08/03/2017  . (No Known Allergies)    Family History  Problem Relation Age of Onset  . Prostate cancer Father     Social History   Socioeconomic History  . Marital status: Widowed    Spouse name: Not on file  . Number of children: Not on file  . Years of education: Not on file  . Highest education level: Not on file  Occupational History  . Occupation: Full time    Employer: [employer]  Social Needs  . Financial resource strain: Not on file  . Food insecurity:    Worry: Not on file    Inability: Not on file  . Transportation needs:    Medical: Not on file    Non-medical: Not on file  Tobacco Use  . Smoking status: Never Smoker  . Smokeless tobacco: Never Used  Substance and Sexual Activity  . Alcohol use: No  . Drug use: No  . Sexual activity: Not on file  Lifestyle  . Physical activity:    Days per week: Not on file    Minutes per session: Not on file  . Stress: Not on file  Relationships  . Social connections:    Talks on phone: Not on file    Gets together: Not on file    Attends religious service: Not on file    Active member of club or organization: Not on file    Attends meetings of clubs or organizations: Not on file    Relationship status: Not on file  . Intimate partner violence:    Fear of current or ex partner: Not on file    Emotionally abused: Not on file    Physically abused: Not on file    Forced sexual activity: Not on file  Other Topics Concern  . Not on file  Social History Narrative   Married   Does not get regular exercise    Review of Systems: See HPI, otherwise negative ROS  Physical Exam: There were no vitals  taken for this visit. General:   Alert,  pleasant and cooperative in NAD Head:  Normocephalic and atraumatic. Neck:  Supple; no masses or thyromegaly. Lungs:  Clear throughout to auscultation.    Heart:  Regular rate and rhythm. Abdomen:  Soft, nontender and nondistended. Normal bowel sounds, without guarding, and without rebound.   Neurologic:  Alert and  oriented x4;  grossly normal neurologically.  Impression/Plan: ANQUINETTE PIERRO is here for an colonoscopy to be performed for colonoscopy for personal history of colon  polyps.  Risks, benefits, limitations, and alternatives regarding  colonoscopy have been reviewed with the patient.  Questions have been answered.  All parties agreeable.   Gaylyn Cheers, MD  10/03/2017, 10:25 AM

## 2017-10-03 NOTE — Anesthesia Post-op Follow-up Note (Signed)
Anesthesia QCDR form completed.        

## 2017-10-04 ENCOUNTER — Ambulatory Visit (INDEPENDENT_AMBULATORY_CARE_PROVIDER_SITE_OTHER): Payer: Medicare HMO

## 2017-10-04 ENCOUNTER — Encounter: Payer: Self-pay | Admitting: Unknown Physician Specialty

## 2017-10-04 DIAGNOSIS — I5022 Chronic systolic (congestive) heart failure: Secondary | ICD-10-CM

## 2017-10-04 DIAGNOSIS — Z95810 Presence of automatic (implantable) cardiac defibrillator: Secondary | ICD-10-CM

## 2017-10-05 NOTE — Progress Notes (Signed)
EPIC Encounter for ICM Monitoring  Patient Name: Katherine Rosales is a 67 y.o. female Date: 10/05/2017 Primary Care Physican: Dion Body, MD Primary [place] Electrophysiologist: Faustino Congress Weight:does not weigh      Heart Failure questions reviewed, pt symptomatic with stomach bloating.   Thoracic impedance abnormal suggesting fluid accumulation starting 10/01/2017.  Prescribed dosage: Furosemide 20 mg 1 tablet dailyand PRN Furosemide if needed.  Labs: 07/26/2017 Creatinine 1.1, BUN 26, Potassium 4.3, Sodium 142, EGFR 50 - Care Everywhere 05/20/2017 Creatinine 1.3, BUN 25, Potassium 4.4, Sodium 141, EGFR 41 - Care Everywhere 01/25/2017 Creatinine 1.2, BUN 16, Potassium 4.3, Sodium 142, EGFR 45 - Care Everywhere 11/11/2016 Creatinine 1.4, BUN 26, Potassium 5.1, Sodium 141, EGFR 38 - Care Everywhere  04/27/2016 Creatinine 1.11, BUN 26, Potassium 4.6, Sdoium 141, EGFR 52-60  Recommendations: Advised to take 1 tablet PRN Furosemide for 3 days and then return to Furosemide 20 mg 1 tablet daily.  Follow-up plan: ICM clinic phone appointment on 10/17/2017 to recheck fluid levels.   Copy of ICM check sent to Dr. [name].   3 month ICM trend: 10/04/2017    1 Year ICM trend:       [name], RN 10/05/2017 11:39 AM

## 2017-10-13 ENCOUNTER — Other Ambulatory Visit: Payer: Self-pay | Admitting: Family Medicine

## 2017-10-13 DIAGNOSIS — Z1231 Encounter for screening mammogram for malignant neoplasm of breast: Secondary | ICD-10-CM

## 2017-10-17 ENCOUNTER — Ambulatory Visit (INDEPENDENT_AMBULATORY_CARE_PROVIDER_SITE_OTHER): Payer: Medicare HMO

## 2017-10-17 DIAGNOSIS — Z95810 Presence of automatic (implantable) cardiac defibrillator: Secondary | ICD-10-CM

## 2017-10-17 DIAGNOSIS — I5022 Chronic systolic (congestive) heart failure: Secondary | ICD-10-CM

## 2017-10-17 NOTE — Progress Notes (Signed)
EPIC Encounter for ICM Monitoring  Patient Name: Katherine Rosales is a 67 y.o. female Date: 10/17/2017 Primary Care Physican: Dion Body, MD Primary [place] Electrophysiologist: Faustino Congress Weight:does not weigh      Heart Failure questions reviewed, pt asymptomatic.   Thoracic impedance returned to normal after taking extra Furosemide 1 tablet x 3 days.  Prescribed dosage: Furosemide 20 mg 1 tablet dailyand PRN Furosemide if needed.  Labs: 07/26/2017 Creatinine 1.1, BUN 26, Potassium 4.3, Sodium 142, EGFR 50 - Care Everywhere 05/20/2017 Creatinine 1.3, BUN 25, Potassium 4.4, Sodium 141, EGFR 41 - Care Everywhere 01/25/2017 Creatinine 1.2, BUN 16, Potassium 4.3, Sodium 142, EGFR 45 - Care Everywhere 11/11/2016 Creatinine 1.4, BUN 26, Potassium 5.1, Sodium 141, EGFR 38 - Care Everywhere  04/27/2016 Creatinine 1.11, BUN 26, Potassium 4.6, Sdoium 141, EGFR 52-60  Recommendations: No changes.   Encouraged to call for fluid symptoms.  Follow-up plan: ICM clinic phone appointment on 11/03/2017.    Copy of ICM check sent to Dr. [name].   3 month ICM trend: 10/17/2017    1 Year ICM trend:       [name], RN 10/17/2017 4:06 PM

## 2017-11-03 ENCOUNTER — Ambulatory Visit (INDEPENDENT_AMBULATORY_CARE_PROVIDER_SITE_OTHER): Payer: Medicare HMO

## 2017-11-03 ENCOUNTER — Telehealth: Payer: Self-pay

## 2017-11-03 DIAGNOSIS — I5022 Chronic systolic (congestive) heart failure: Secondary | ICD-10-CM | POA: Diagnosis not present

## 2017-11-03 DIAGNOSIS — Z95810 Presence of automatic (implantable) cardiac defibrillator: Secondary | ICD-10-CM | POA: Diagnosis not present

## 2017-11-03 NOTE — Telephone Encounter (Signed)
LMOVM reminding pt to send remote transmission.   

## 2017-11-03 NOTE — Progress Notes (Signed)
EPIC Encounter for ICM Monitoring  Patient Name: Katherine Rosales is a 67 y.o. female Date: 11/03/2017 Primary Care Physican: Dion Body, MD Primary [place] Electrophysiologist: Faustino Congress Weight:does not weigh       Heart Failure questions reviewed, pt asymptomatic.   Thoracic impedance abnormal suggesting fluid accumulation starting 10/26/2017.  Prescribed dosage: Furosemide 20 mg 1 tablet dailyand PRN Furosemide if needed.  Labs: 07/26/2017 Creatinine 1.1, BUN 26, Potassium 4.3, Sodium 142, EGFR 50 - Care Everywhere 05/20/2017 Creatinine 1.3, BUN 25, Potassium 4.4, Sodium 141, EGFR 41 - Care Everywhere 01/25/2017 Creatinine 1.2, BUN 16, Potassium 4.3, Sodium 142, EGFR 45 - Care Everywhere 11/11/2016 Creatinine 1.4, BUN 26, Potassium 5.1, Sodium 141, EGFR 38 - Care Everywhere  04/27/2016 Creatinine 1.11, BUN 26, Potassium 4.6, Sdoium 141, EGFR 52-60  Recommendations:  Advised to take Furosemide 40 mg daily x 3 days and then return to 20 mg daily.  Follow-up plan: ICM clinic phone appointment on 11/07/2017 to recheck fluid levels.       Copy of ICM check sent to Dr. [name].   3 month ICM trend: 11/03/2017    1 Year ICM trend:       [name], RN 11/03/2017 2:25 PM

## 2017-11-07 ENCOUNTER — Ambulatory Visit (INDEPENDENT_AMBULATORY_CARE_PROVIDER_SITE_OTHER): Payer: Self-pay

## 2017-11-07 DIAGNOSIS — I5022 Chronic systolic (congestive) heart failure: Secondary | ICD-10-CM

## 2017-11-07 DIAGNOSIS — Z95810 Presence of automatic (implantable) cardiac defibrillator: Secondary | ICD-10-CM

## 2017-11-08 NOTE — Progress Notes (Signed)
EPIC Encounter for ICM Monitoring  Patient Name: Katherine Rosales is a 67 y.o. female Date: 11/08/2017 Primary Care Physican: Dion Body, MD Primary [place] Electrophysiologist: Faustino Congress Weight:does not weigh        Heart Failure questions reviewed, pt asymptomatic.   Thoracic impedance returned to normal after taking 3 days of extra Furosemide.  Prescribed dosage: Furosemide 20 mg 1 tablet dailyand PRN Furosemide if needed.  Labs: 07/26/2017 Creatinine 1.1, BUN 26, Potassium 4.3, Sodium 142, EGFR 50 - Care Everywhere 05/20/2017 Creatinine 1.3, BUN 25, Potassium 4.4, Sodium 141, EGFR 41 - Care Everywhere 01/25/2017 Creatinine 1.2, BUN 16, Potassium 4.3, Sodium 142, EGFR 45 - Care Everywhere 11/11/2016 Creatinine 1.4, BUN 26, Potassium 5.1, Sodium 141, EGFR 38 - Care Everywhere  04/27/2016 Creatinine 1.11, BUN 26, Potassium 4.6, Sdoium 141, EGFR 52-60  Recommendations: No changes.   Encouraged to call for fluid symptoms.  Follow-up plan: ICM clinic phone appointment on 12/08/2017.     Copy of ICM check sent to Dr. [name].   3 month ICM trend: 11/07/2017    1 Year ICM trend:       Katherine Billings, RN 11/08/2017 1:10 PM

## 2017-11-14 ENCOUNTER — Ambulatory Visit
Admission: RE | Admit: 2017-11-14 | Discharge: 2017-11-14 | Disposition: A | Discharge status: Home / Self Care | Payer: Medicare HMO | Source: Ambulatory Visit | Attending: Family Medicine | Admitting: Family Medicine

## 2017-11-14 DIAGNOSIS — Z1231 Encounter for screening mammogram for malignant neoplasm of breast: Secondary | ICD-10-CM | POA: Diagnosis not present

## 2017-11-14 HISTORY — DX: Personal history of antineoplastic chemotherapy: Z92.21

## 2017-11-14 HISTORY — DX: Personal history of irradiation: Z92.3

## 2017-11-17 DIAGNOSIS — E1143 Type 2 diabetes mellitus with diabetic autonomic (poly)neuropathy: Secondary | ICD-10-CM | POA: Diagnosis not present

## 2017-11-17 DIAGNOSIS — N183 Chronic kidney disease, stage 3 (moderate): Secondary | ICD-10-CM | POA: Diagnosis not present

## 2017-11-17 DIAGNOSIS — F331 Major depressive disorder, recurrent, moderate: Secondary | ICD-10-CM | POA: Diagnosis not present

## 2017-11-17 DIAGNOSIS — E039 Hypothyroidism, unspecified: Secondary | ICD-10-CM | POA: Diagnosis not present

## 2017-11-17 DIAGNOSIS — D638 Anemia in other chronic diseases classified elsewhere: Secondary | ICD-10-CM | POA: Diagnosis not present

## 2017-11-17 DIAGNOSIS — D696 Thrombocytopenia, unspecified: Secondary | ICD-10-CM | POA: Diagnosis not present

## 2017-11-17 DIAGNOSIS — Z0000 Encounter for general adult medical examination without abnormal findings: Secondary | ICD-10-CM | POA: Diagnosis not present

## 2017-11-18 DIAGNOSIS — N184 Chronic kidney disease, stage 4 (severe): Secondary | ICD-10-CM | POA: Insufficient documentation

## 2017-11-21 DIAGNOSIS — N289 Disorder of kidney and ureter, unspecified: Secondary | ICD-10-CM | POA: Diagnosis not present

## 2017-12-08 ENCOUNTER — Ambulatory Visit (INDEPENDENT_AMBULATORY_CARE_PROVIDER_SITE_OTHER): Payer: Medicare HMO

## 2017-12-08 DIAGNOSIS — I5022 Chronic systolic (congestive) heart failure: Secondary | ICD-10-CM

## 2017-12-08 DIAGNOSIS — Z95810 Presence of automatic (implantable) cardiac defibrillator: Secondary | ICD-10-CM

## 2017-12-09 ENCOUNTER — Other Ambulatory Visit: Payer: Self-pay | Admitting: Internal Medicine

## 2017-12-12 NOTE — Progress Notes (Signed)
EPIC Encounter for ICM Monitoring  Patient Name: Katherine Rosales is a 67 y.o. female Date: 12/12/2017 Primary Care Physican: Dion Body, MD Primary [place] Electrophysiologist: Faustino Congress Weight:does not weigh      Heart Failure questions reviewed, pt asymptomatic.  Patient's PCP was worried about the kidney function after lab results reviewed in August.  Furosemide was held for a few days and was told to increase fluid intake. She was referred to [facility] for follow up   Thoracic impedance normal.  Prescribed dosage: Furosemide 20 mg 1 tablet dailyand PRN Furosemide if needed.  Labs: 11/21/2017 Creatinine 1.5,   BUN 19, Potassium 4.5, Sodium 141, EGFR 35 - Care Everywhere 11/17/2017 Creatinine 1.6,   BUN 30, Potassium 4.5, Sodium 140, EGFR 32 - Care Everywhere 07/26/2017 Creatinine 1.1, BUN 26, Potassium 4.3, Sodium 142, EGFR 50 - Care Everywhere 05/20/2017 Creatinine 1.3, BUN 25, Potassium 4.4, Sodium 141, EGFR 41 - Care Everywhere 01/25/2017 Creatinine 1.2, BUN 16, Potassium 4.3, Sodium 142, EGFR 45 - Care Everywhere 11/11/2016 Creatinine 1.4, BUN 26, Potassium 5.1, Sodium 141, EGFR 38 - Care Everywhere  04/27/2016 Creatinine 1.11, BUN 26, Potassium 4.6, Sdoium 141, EGFR 52-60  Recommendations: No changes.   Encouraged to call for fluid symptoms.  Follow-up plan: ICM clinic phone appointment on 01/10/2018.    Copy of ICM check sent to Dr. [name].   3 month ICM trend: 12/08/2017    1 Year ICM trend:       [name], RN 12/12/2017 9:49 AM

## 2017-12-27 DIAGNOSIS — E1122 Type 2 diabetes mellitus with diabetic chronic kidney disease: Secondary | ICD-10-CM | POA: Diagnosis not present

## 2017-12-27 DIAGNOSIS — N183 Chronic kidney disease, stage 3 (moderate): Secondary | ICD-10-CM | POA: Diagnosis not present

## 2017-12-27 DIAGNOSIS — R809 Proteinuria, unspecified: Secondary | ICD-10-CM | POA: Diagnosis not present

## 2017-12-27 DIAGNOSIS — I129 Hypertensive chronic kidney disease with stage 1 through stage 4 chronic kidney disease, or unspecified chronic kidney disease: Secondary | ICD-10-CM | POA: Diagnosis not present

## 2018-01-10 ENCOUNTER — Ambulatory Visit (INDEPENDENT_AMBULATORY_CARE_PROVIDER_SITE_OTHER): Payer: Medicare HMO

## 2018-01-10 DIAGNOSIS — I5022 Chronic systolic (congestive) heart failure: Secondary | ICD-10-CM

## 2018-01-10 DIAGNOSIS — Z95810 Presence of automatic (implantable) cardiac defibrillator: Secondary | ICD-10-CM | POA: Diagnosis not present

## 2018-01-10 NOTE — Progress Notes (Signed)
EPIC Encounter for ICM Monitoring  Patient Name: Katherine Rosales is a 67 y.o. female Date: 01/10/2018 Primary Care Physican: Dion Body, MD Primary [place] Electrophysiologist: Faustino Congress Weight:does not weigh           Heart Failure questions reviewed, pt asymptomatic.   Thoracic impedance normal.  Prescribed: Furosemide 20 mg 1 tablet dailyand PRN Furosemide if needed.    Labs: 12/27/2017 Creatinine 1.35, BUN 26, Potassium 4.7, Sodium 138, eGFR 40-47 11/21/2017 Creatinine 1.5,   BUN 19, Potassium 4.5, Sodium 141, eGFR 35 - Care Everywhere 11/17/2017 Creatinine 1.6,   BUN 30, Potassium 4.5, Sodium 140, eGFR 32 - Care Everywhere 07/26/2017 Creatinine 1.1, BUN 26, Potassium 4.3, Sodium 142, eGFR 50 - Care Everywhere 05/20/2017 Creatinine 1.3, BUN 25, Potassium 4.4, Sodium 141, eGFR 41 - Care Everywhere 01/25/2017 Creatinine 1.2, BUN 16, Potassium 4.3, Sodium 142, eGFR 45 - Care Everywhere 11/11/2016 Creatinine 1.4, BUN 26, Potassium 5.1, Sodium 141, eGFR 38 - Care Everywhere  04/27/2016 Creatinine 1.11, BUN 26, Potassium 4.6, Sdoium 141, eGFR 52-60  Recommendations: No changes.  fluid intake to 64 oz daily.  Encouraged to call for fluid symptoms.  Follow-up plan: ICM clinic phone appointment on 02/13/2018.    Copy of ICM check sent to Dr. [name].   3 month ICM trend: 01/10/2018    1 Year ICM trend:       [name], RN 01/10/2018 4:01 PM

## 2018-02-06 ENCOUNTER — Other Ambulatory Visit: Payer: Self-pay | Admitting: Student

## 2018-02-06 DIAGNOSIS — K7460 Unspecified cirrhosis of liver: Secondary | ICD-10-CM | POA: Insufficient documentation

## 2018-02-06 DIAGNOSIS — K7581 Nonalcoholic steatohepatitis (NASH): Principal | ICD-10-CM

## 2018-02-06 DIAGNOSIS — D693 Immune thrombocytopenic purpura: Secondary | ICD-10-CM | POA: Diagnosis not present

## 2018-02-13 ENCOUNTER — Ambulatory Visit (INDEPENDENT_AMBULATORY_CARE_PROVIDER_SITE_OTHER): Payer: Medicare HMO

## 2018-02-13 ENCOUNTER — Ambulatory Visit (INDEPENDENT_AMBULATORY_CARE_PROVIDER_SITE_OTHER): Payer: Medicare HMO | Admitting: *Deleted

## 2018-02-13 DIAGNOSIS — I5022 Chronic systolic (congestive) heart failure: Secondary | ICD-10-CM

## 2018-02-13 DIAGNOSIS — Z95810 Presence of automatic (implantable) cardiac defibrillator: Secondary | ICD-10-CM

## 2018-02-13 DIAGNOSIS — I428 Other cardiomyopathies: Secondary | ICD-10-CM

## 2018-02-14 ENCOUNTER — Ambulatory Visit: Payer: Medicare HMO

## 2018-02-14 DIAGNOSIS — N041 Nephrotic syndrome with focal and segmental glomerular lesions: Secondary | ICD-10-CM | POA: Diagnosis not present

## 2018-02-14 DIAGNOSIS — I129 Hypertensive chronic kidney disease with stage 1 through stage 4 chronic kidney disease, or unspecified chronic kidney disease: Secondary | ICD-10-CM | POA: Diagnosis not present

## 2018-02-14 DIAGNOSIS — N183 Chronic kidney disease, stage 3 (moderate): Secondary | ICD-10-CM | POA: Diagnosis not present

## 2018-02-14 DIAGNOSIS — R809 Proteinuria, unspecified: Secondary | ICD-10-CM | POA: Diagnosis not present

## 2018-02-14 DIAGNOSIS — N2581 Secondary hyperparathyroidism of renal origin: Secondary | ICD-10-CM | POA: Diagnosis not present

## 2018-02-14 DIAGNOSIS — E1122 Type 2 diabetes mellitus with diabetic chronic kidney disease: Secondary | ICD-10-CM | POA: Diagnosis not present

## 2018-02-14 NOTE — Progress Notes (Signed)
Remote ICD transmission.   

## 2018-02-14 NOTE — Progress Notes (Signed)
EPIC Encounter for ICM Monitoring  Patient Name: Katherine Rosales is a 67 y.o. female Date: 02/14/2018 Primary Care Physican: Dion Body, MD Primary [place] Electrophysiologist: Virginia Crews Weight: 227 lbs        Heart Failure questions reviewed, pt asymptomatic.  Ultrasound on liver and kidney this week.   Thoracic impedance normal.   Prescribed: Furosemide 20 mg 1 tablet dailyand PRN Furosemide if needed.    Labs: 12/27/2017 Creatinine 1.35, BUN 26, Potassium 4.7, Sodium 138, eGFR 40-47 11/21/2017 Creatinine 1.5, BUN 19, Potassium 4.5, Sodium 141, eGFR 35 - Care Everywhere 11/17/2017 Creatinine 1.6, BUN 30, Potassium 4.5, Sodium 140, eGFR 32 - Care Everywhere 07/26/2017 Creatinine 1.1, BUN 26, Potassium 4.3, Sodium 142, eGFR 50 - Care Everywhere 05/20/2017 Creatinine 1.3, BUN 25, Potassium 4.4, Sodium 141, eGFR 41 - Care Everywhere 01/25/2017 Creatinine 1.2, BUN 16, Potassium 4.3, Sodium 142, eGFR 45 - Care Everywhere 11/11/2016 Creatinine 1.4, BUN 26, Potassium 5.1, Sodium 141, eGFR 38 - Care Everywhere  04/27/2016 Creatinine 1.11, BUN 26, Potassium 4.6, Sdoium 141, eGFR 52-60  Recommendations: No changes.   Encouraged to call for fluid symptoms.  Follow-up plan: ICM clinic phone appointment on 03/16/2018.    Copy of ICM check sent to Dr. [name].   3 month ICM trend: 02/13/2018    1 Year ICM trend:       [name], RN 02/14/2018 12:59 PM

## 2018-02-16 ENCOUNTER — Ambulatory Visit
Admission: RE | Admit: 2018-02-16 | Discharge: 2018-02-16 | Disposition: A | Discharge status: Home / Self Care | Payer: Medicare HMO | Source: Ambulatory Visit | Attending: Student | Admitting: Student

## 2018-02-16 DIAGNOSIS — F331 Major depressive disorder, recurrent, moderate: Secondary | ICD-10-CM | POA: Diagnosis not present

## 2018-02-16 DIAGNOSIS — E1143 Type 2 diabetes mellitus with diabetic autonomic (poly)neuropathy: Secondary | ICD-10-CM | POA: Diagnosis not present

## 2018-02-16 DIAGNOSIS — F411 Generalized anxiety disorder: Secondary | ICD-10-CM | POA: Diagnosis not present

## 2018-02-16 DIAGNOSIS — E039 Hypothyroidism, unspecified: Secondary | ICD-10-CM | POA: Diagnosis not present

## 2018-02-16 DIAGNOSIS — R161 Splenomegaly, not elsewhere classified: Secondary | ICD-10-CM | POA: Insufficient documentation

## 2018-02-16 DIAGNOSIS — K7460 Unspecified cirrhosis of liver: Secondary | ICD-10-CM | POA: Diagnosis not present

## 2018-02-16 DIAGNOSIS — K769 Liver disease, unspecified: Secondary | ICD-10-CM | POA: Diagnosis not present

## 2018-02-16 DIAGNOSIS — D638 Anemia in other chronic diseases classified elsewhere: Secondary | ICD-10-CM | POA: Diagnosis not present

## 2018-02-16 DIAGNOSIS — K7581 Nonalcoholic steatohepatitis (NASH): Secondary | ICD-10-CM | POA: Diagnosis not present

## 2018-02-16 DIAGNOSIS — N183 Chronic kidney disease, stage 3 (moderate): Secondary | ICD-10-CM | POA: Diagnosis not present

## 2018-03-16 ENCOUNTER — Ambulatory Visit (INDEPENDENT_AMBULATORY_CARE_PROVIDER_SITE_OTHER): Payer: Medicare HMO

## 2018-03-16 DIAGNOSIS — Z95810 Presence of automatic (implantable) cardiac defibrillator: Secondary | ICD-10-CM | POA: Diagnosis not present

## 2018-03-16 DIAGNOSIS — I5022 Chronic systolic (congestive) heart failure: Secondary | ICD-10-CM

## 2018-03-17 NOTE — Progress Notes (Signed)
EPIC Encounter for ICM Monitoring  Patient Name: Katherine Rosales is a 67 y.o. female Date: 03/17/2018 Primary Care Physican: Dion Body, MD Primary Care Physican: Dion Body, MD Primary [place] Electrophysiologist: [name] Last Weight: 227 lbs                                                    Transmission reviewed   Thoracic impedance normal.   Prescribed: Furosemide 20 mg 1 tablet dailyand PRN Furosemide if needed.   Labs: 12/27/2017 Creatinine 1.35, BUN 26, Potassium 4.7, Sodium 138, eGFR 40-47 11/21/2017 Creatinine 1.5, BUN 19, Potassium 4.5, Sodium 141,eGFR 35 - Care Everywhere 11/17/2017 Creatinine 1.6, BUN 30, Potassium 4.5, Sodium 140,eGFR 32 - Care Everywhere 07/26/2017 Creatinine 1.1, BUN 26, Potassium 4.3, Sodium 142, eGFR 50 - Care Everywhere 05/20/2017 Creatinine 1.3, BUN 25, Potassium 4.4, Sodium 141, eGFR 41 - Care Everywhere 01/25/2017 Creatinine 1.2, BUN 16, Potassium 4.3, Sodium 142, eGFR 45 - Care Everywhere 11/11/2016 Creatinine 1.4, BUN 26, Potassium 5.1, Sodium 141, eGFR 38 - Care Everywhere  04/27/2016 Creatinine 1.11, BUN 26, Potassium 4.6, Sdoium 141,eGFR 52-60  Recommendations: None  Follow-up plan: ICM clinic phone appointment on 04/24/2018.    Copy of ICM check sent to Dr. [name].    3 month ICM trend: 03/16/2018    1 Year ICM trend:       [name], RN 03/17/2018 4:36 PM

## 2018-03-23 DIAGNOSIS — H04123 Dry eye syndrome of bilateral lacrimal glands: Secondary | ICD-10-CM | POA: Diagnosis not present

## 2018-03-23 DIAGNOSIS — E119 Type 2 diabetes mellitus without complications: Secondary | ICD-10-CM | POA: Diagnosis not present

## 2018-03-23 DIAGNOSIS — H25093 Other age-related incipient cataract, bilateral: Secondary | ICD-10-CM | POA: Diagnosis not present

## 2018-03-23 DIAGNOSIS — D3131 Benign neoplasm of right choroid: Secondary | ICD-10-CM | POA: Diagnosis not present

## 2018-04-15 LAB — CUP PACEART REMOTE DEVICE CHECK
Battery Voltage: 3.01 V
Date Time Interrogation Session: 20191111051704
HighPow Impedance: 85 Ohm
Implantable Lead Location: 753860
Implantable Lead Model: 6935
Lead Channel Impedance Value: 342 Ohm
Lead Channel Impedance Value: 437 Ohm
Lead Channel Pacing Threshold Pulse Width: 0.4 ms
Lead Channel Sensing Intrinsic Amplitude: 14.5 mV
Lead Channel Setting Pacing Amplitude: 2 V
Lead Channel Setting Pacing Pulse Width: 0.4 ms
MDC IDC LEAD IMPLANT DT: 20140813
MDC IDC MSMT BATTERY REMAINING LONGEVITY: 76 mo
MDC IDC MSMT LEADCHNL RV PACING THRESHOLD AMPLITUDE: 0.75 V
MDC IDC MSMT LEADCHNL RV SENSING INTR AMPL: 14.5 mV
MDC IDC PG IMPLANT DT: 20140813
MDC IDC SET LEADCHNL RV SENSING SENSITIVITY: 0.3 mV
MDC IDC STAT BRADY RV PERCENT PACED: 0.07 %

## 2018-04-24 ENCOUNTER — Ambulatory Visit (INDEPENDENT_AMBULATORY_CARE_PROVIDER_SITE_OTHER): Payer: Medicare HMO

## 2018-04-24 DIAGNOSIS — I5022 Chronic systolic (congestive) heart failure: Secondary | ICD-10-CM

## 2018-04-24 DIAGNOSIS — Z95810 Presence of automatic (implantable) cardiac defibrillator: Secondary | ICD-10-CM

## 2018-04-24 MED ORDER — FUROSEMIDE 20 MG PO TABS: ORAL_TABLET | ORAL | 1 refills | Status: DC

## 2018-04-24 NOTE — Progress Notes (Signed)
EPIC Encounter for ICM Monitoring  Patient Name: Katherine Rosales is a 68 y.o. female Date: 04/24/2018 Primary Care Physican: Dion Body, MD Primary [place] Electrophysiologist: [name] Last Weight:227lbs  Today's Weight: 227 lbs        Heart Failure questions reviewed.   Pt asymptomatic.  She has been eating foods higher in salt.   Report: Thoracic impedance abnormal suggesting fluid accumulation starting 04/11/2018.   Prescribed: Furosemide 20 mg 1 tablet dailyand PRN Furosemide if needed.   Labs: 12/27/2017 Creatinine 1.35, BUN 26, Potassium 4.7, Sodium 138, eGFR 40-47 11/21/2017 Creatinine 1.5, BUN 19, Potassium 4.5, Sodium 141,eGFR 35 - Care Everywhere 11/17/2017 Creatinine 1.6, BUN 30, Potassium 4.5, Sodium 140,eGFR 32 - Care Everywhere 07/26/2017 Creatinine 1.1, BUN 26, Potassium 4.3, Sodium 142, eGFR 50 - Care Everywhere 05/20/2017 Creatinine 1.3, BUN 25, Potassium 4.4, Sodium 141, eGFR 41 - Care Everywhere 01/25/2017 Creatinine 1.2, BUN 16, Potassium 4.3, Sodium 142, eGFR 45 - Care Everywhere 11/11/2016 Creatinine 1.4, BUN 26, Potassium 5.1, Sodium 141, eGFR 38 - Care Everywhere  04/27/2016 Creatinine 1.11, BUN 26, Potassium 4.6, Sdoium 141,eGFR 52-60  Recommendations:  Advised to take PRN Furosemide 1 tablet x 3 days in addition to her prescribed daily dosage of Furosemide.  Patient needs refill of PRN furosemide and was sent to [facility] as requested.   Follow-up plan: ICM clinic phone appointment on 04/28/2018 to recheck fluid levels.   Recall for Dr [name] 05/17/2018  Copy of ICM check sent to Dr. [name].   3 month ICM trend: 04/24/2018    1 Year ICM trend:       [name], RN 04/24/2018 12:38 PM

## 2018-04-28 ENCOUNTER — Ambulatory Visit (INDEPENDENT_AMBULATORY_CARE_PROVIDER_SITE_OTHER): Payer: Medicare HMO

## 2018-04-28 ENCOUNTER — Telehealth: Payer: Self-pay

## 2018-04-28 DIAGNOSIS — Z95810 Presence of automatic (implantable) cardiac defibrillator: Secondary | ICD-10-CM

## 2018-04-28 DIAGNOSIS — I5022 Chronic systolic (congestive) heart failure: Secondary | ICD-10-CM

## 2018-04-28 NOTE — Telephone Encounter (Signed)
Attempted return call to patient as requested by voice mail message.  Patient checking to see if the remote transmission was received today or yesterday.  Left message transmission not received and listed steps to send remote.

## 2018-04-28 NOTE — Progress Notes (Signed)
EPIC Encounter for ICM Monitoring  Patient Name: Katherine Rosales is a 67 y.o. female Date: 04/28/2018 Primary Care Physican: Linthavong, Kanhka, MD Primary Cardiologist:Klein Electrophysiologist: Klein LastWeight:227lbs  Today's Weight: has not weighed                                                            Heart Failure questions reviewed.   Pt asymptomatic.     Report: Thoracic impedance returned to normal after taking PRN Furosemide.   Prescribed: Furosemide 20 mg 1 tablet dailyand PRN Furosemide if needed.   Labs: 12/27/2017 Creatinine 1.35, BUN 26, Potassium 4.7, Sodium 138, eGFR 40-47 11/21/2017 Creatinine 1.5, BUN 19, Potassium 4.5, Sodium 141,eGFR 35 - Care Everywhere 11/17/2017 Creatinine 1.6, BUN 30, Potassium 4.5, Sodium 140,eGFR 32 - Care Everywhere 07/26/2017 Creatinine 1.1, BUN 26, Potassium 4.3, Sodium 142, eGFR 50 - Care Everywhere 05/20/2017 Creatinine 1.3, BUN 25, Potassium 4.4, Sodium 141, eGFR 41 - Care Everywhere 01/25/2017 Creatinine 1.2, BUN 16, Potassium 4.3, Sodium 142, eGFR 45 - Care Everywhere 11/11/2016 Creatinine 1.4, BUN 26, Potassium 5.1, Sodium 141, eGFR 38 - Care Everywhere  04/27/2016 Creatinine 1.11, BUN 26, Potassium 4.6, Sdoium 141,eGFR 52-60  Recommendations:  No changes.  Encouraged to call for fluid symptoms.   Follow-up plan: ICM clinic phone appointment on 05/29/2018 to recheck fluid levels.   Recall for Dr Klein 05/17/2018  Copy of ICM check sent to Dr. Klein.   3 month ICM trend: 04/28/2018    1 Year ICM trend:       Laurie S Short, RN 04/28/2018 2:26 PM   

## 2018-05-15 ENCOUNTER — Telehealth: Payer: Self-pay

## 2018-05-15 ENCOUNTER — Ambulatory Visit (INDEPENDENT_AMBULATORY_CARE_PROVIDER_SITE_OTHER): Payer: Medicare HMO

## 2018-05-15 DIAGNOSIS — I5022 Chronic systolic (congestive) heart failure: Secondary | ICD-10-CM

## 2018-05-15 DIAGNOSIS — I428 Other cardiomyopathies: Secondary | ICD-10-CM | POA: Diagnosis not present

## 2018-05-15 NOTE — Telephone Encounter (Signed)
Returned patient call as requested by voice mail message. She asked if she should send a transmission that was scheduled for today and explained this transmission is for the in depth review report that is done every 3 months.  I advised next fluid level remote check will be 05/29/2018. She said she will send it in.

## 2018-05-16 LAB — CUP PACEART REMOTE DEVICE CHECK
Battery Voltage: 3 V
Brady Statistic RV Percent Paced: 0.05 %
Date Time Interrogation Session: 20200210113423
HighPow Impedance: 81 Ohm
Implantable Lead Implant Date: 20140813
Implantable Lead Location: 753860
Implantable Lead Model: 6935
Implantable Pulse Generator Implant Date: 20140813
Lead Channel Impedance Value: 342 Ohm
Lead Channel Impedance Value: 437 Ohm
Lead Channel Pacing Threshold Amplitude: 0.75 V
Lead Channel Pacing Threshold Pulse Width: 0.4 ms
Lead Channel Sensing Intrinsic Amplitude: 13.25 mV
Lead Channel Sensing Intrinsic Amplitude: 13.25 mV
Lead Channel Setting Pacing Pulse Width: 0.4 ms
MDC IDC MSMT BATTERY REMAINING LONGEVITY: 78 mo
MDC IDC SET LEADCHNL RV PACING AMPLITUDE: 2 V
MDC IDC SET LEADCHNL RV SENSING SENSITIVITY: 0.3 mV

## 2018-05-23 ENCOUNTER — Other Ambulatory Visit: Payer: Self-pay | Admitting: Internal Medicine

## 2018-05-29 ENCOUNTER — Ambulatory Visit (INDEPENDENT_AMBULATORY_CARE_PROVIDER_SITE_OTHER): Payer: Medicare HMO

## 2018-05-29 DIAGNOSIS — I5022 Chronic systolic (congestive) heart failure: Secondary | ICD-10-CM | POA: Diagnosis not present

## 2018-05-29 DIAGNOSIS — Z95810 Presence of automatic (implantable) cardiac defibrillator: Secondary | ICD-10-CM | POA: Diagnosis not present

## 2018-05-29 NOTE — Progress Notes (Signed)
Remote ICD transmission.   

## 2018-05-29 NOTE — Progress Notes (Signed)
EPIC Encounter for ICM Monitoring  Patient Name: Katherine Rosales is a 68 y.o. female Date: 05/29/2018 Primary Care Physican: Dion Body, MD Primary [place] Electrophysiologist: [name] LastWeight: 227lbs Today's Weight:not weighing    Heart Failure questions reviewed. Pt asymptomatic but generally did not feel well a couple of days ago.  She feels okay today.  Report: Thoracic impedance abnormalbut is returning to baseline.   Prescribed:Furosemide 20 mg 1 tablet dailyand PRN Furosemide if needed.   Labs: 12/27/2017 Creatinine 1.35, BUN 26, Potassium 4.7, Sodium 138, eGFR 40-47 11/21/2017 Creatinine 1.5, BUN 19, Potassium 4.5, Sodium 141,eGFR 35 - Care Everywhere 11/17/2017 Creatinine 1.6, BUN 30, Potassium 4.5, Sodium 140,eGFR 32 - Care Everywhere 07/26/2017 Creatinine 1.1, BUN 26, Potassium 4.3, Sodium 142, eGFR 50 - Care Everywhere 05/20/2017 Creatinine 1.3, BUN 25, Potassium 4.4, Sodium 141, eGFR 41 - Care Everywhere 01/25/2017 Creatinine 1.2, BUN 16, Potassium 4.3, Sodium 142, eGFR 45 - Care Everywhere 11/11/2016 Creatinine 1.4, BUN 26, Potassium 5.1, Sodium 141, eGFR 38 - Care Everywhere  04/27/2016 Creatinine 1.11, BUN 26, Potassium 4.6, Sdoium 141,eGFR 52-60  Recommendations:She said she will take 1 extra Furosemide if needed.  Encouraged to call for fluid symptoms.   Follow-up plan: ICM clinic phone appointment on3/30/2020. Advised to call office to schedule a routine yearly follow up with Dr [name].  Copy of ICM check sent to [place].   3 month ICM trend: 05/29/2018    1 Year ICM trend:       [name], RN 05/29/2018 12:27 PM

## 2018-06-09 ENCOUNTER — Telehealth: Payer: Self-pay

## 2018-06-09 NOTE — Telephone Encounter (Signed)
Returned patient call as requested regarding remote transmission billing.  She stated the insurance has charged her for one of the remote codes that she has never gotten before.  Advised I spoke with billing and the bill was corrected and she should received an updated bill.  She was grateful for the assistance.

## 2018-06-20 ENCOUNTER — Telehealth: Payer: Self-pay | Admitting: Internal Medicine

## 2018-06-20 DIAGNOSIS — N183 Chronic kidney disease, stage 3 (moderate): Secondary | ICD-10-CM | POA: Diagnosis not present

## 2018-06-20 DIAGNOSIS — D638 Anemia in other chronic diseases classified elsewhere: Secondary | ICD-10-CM | POA: Diagnosis not present

## 2018-06-20 DIAGNOSIS — E039 Hypothyroidism, unspecified: Secondary | ICD-10-CM | POA: Diagnosis not present

## 2018-06-20 DIAGNOSIS — E1143 Type 2 diabetes mellitus with diabetic autonomic (poly)neuropathy: Secondary | ICD-10-CM | POA: Diagnosis not present

## 2018-06-20 NOTE — Telephone Encounter (Signed)
Called and spoke to pt regarding appointment 06/27/18 for followup NICM and  ICD  Currently symptoms are stable   Discussed rescheduling of the appt 6-8 weeks from now  Pt is agreeable and advised to call if interval problems

## 2018-06-27 ENCOUNTER — Encounter: Payer: Medicare HMO | Admitting: Internal Medicine

## 2018-07-03 ENCOUNTER — Other Ambulatory Visit: Payer: Self-pay

## 2018-07-03 ENCOUNTER — Ambulatory Visit (INDEPENDENT_AMBULATORY_CARE_PROVIDER_SITE_OTHER): Payer: Medicare HMO

## 2018-07-03 DIAGNOSIS — Z95810 Presence of automatic (implantable) cardiac defibrillator: Secondary | ICD-10-CM | POA: Diagnosis not present

## 2018-07-03 DIAGNOSIS — I5022 Chronic systolic (congestive) heart failure: Secondary | ICD-10-CM | POA: Diagnosis not present

## 2018-07-04 NOTE — Progress Notes (Signed)
EPIC Encounter for ICM Monitoring  Patient Name: Katherine Rosales is a 68 y.o. female Date: 07/04/2018 Primary Care Physican: Dion Body, MD Primary [place] Electrophysiologist: [name] LastWeight: 227lbs Today's Weight:not weighing   Heart Failure questions reviewed. Pt she feels bloated and took extra Furosemide today and will take for 2 more days.  Report: Thoracic impedanceabnormalbut is returning to baseline.  Prescribed:Furosemide 20 mg 1 tablet dailyand PRN Furosemide if needed.   Labs: 12/27/2017 Creatinine 1.35, BUN 26, Potassium 4.7, Sodium 138, eGFR 40-47 11/21/2017 Creatinine 1.5, BUN 19, Potassium 4.5, Sodium 141,eGFR 35 - Care Everywhere 11/17/2017 Creatinine 1.6, BUN 30, Potassium 4.5, Sodium 140,eGFR 32 - Care Everywhere 07/26/2017 Creatinine 1.1, BUN 26, Potassium 4.3, Sodium 142, eGFR 50 - Care Everywhere 05/20/2017 Creatinine 1.3, BUN 25, Potassium 4.4, Sodium 141, eGFR 41 - Care Everywhere 01/25/2017 Creatinine 1.2, BUN 16, Potassium 4.3, Sodium 142, eGFR 45 - Care Everywhere 11/11/2016 Creatinine 1.4, BUN 26, Potassium 5.1, Sodium 141, eGFR 38 - Care Everywhere  04/27/2016 Creatinine 1.11, BUN 26, Potassium 4.6, Sdoium 141,eGFR 52-60  Recommendations: She took extra Furosemide today and will take for 2 more days.  Follow-up plan: ICM clinic phone appointment on4/06/2018 to recheck fluid levels.   Copy of ICM check sent to [place].   3 month ICM trend: 07/03/2018    1 Year ICM trend:       [name], RN 07/04/2018 1:40 PM

## 2018-07-07 ENCOUNTER — Telehealth: Payer: Self-pay

## 2018-07-07 ENCOUNTER — Other Ambulatory Visit: Payer: Self-pay

## 2018-07-07 ENCOUNTER — Ambulatory Visit (INDEPENDENT_AMBULATORY_CARE_PROVIDER_SITE_OTHER): Payer: Medicare HMO

## 2018-07-07 DIAGNOSIS — Z95810 Presence of automatic (implantable) cardiac defibrillator: Secondary | ICD-10-CM

## 2018-07-07 DIAGNOSIS — I5022 Chronic systolic (congestive) heart failure: Secondary | ICD-10-CM

## 2018-07-07 NOTE — Telephone Encounter (Signed)
Patient called and reported there is still a problem with 04/24/2018 billing.  Advised I had spoke with billing department to correct but will follow up with billing on that issue.  She said she has other bills that she is getting from [employer] and they are saying the remote transmissions are considered office visits. She contacted her insurance company today and was told to contact the billing department about it.  Advised she will need to speak with someone in the billing department for other bills she has received.  She needs the contact number.  Advised I will get a number for her and call her back.

## 2018-07-07 NOTE — Telephone Encounter (Signed)
Provided patient with billing contact number.  She will call to inquire about recent charges.

## 2018-07-07 NOTE — Progress Notes (Signed)
EPIC Encounter for ICM Monitoring  Patient Name: Katherine Rosales is a 68 y.o. female Date: 07/07/2018 Primary Care Physican: Dion Body, MD Primary [place] Electrophysiologist: [name] LastWeight: 227lbs Today's Weight:not weighing   Heart Failure questions reviewed. Pt is asymptomatic and feeling fine.  Report: Thoracic impedancereturned to normal since taking extra Furosemide.  Prescribed:Furosemide 20 mg 1 tablet dailyand PRN Furosemide if needed.   Labs: 12/27/2017 Creatinine 1.35, BUN 26, Potassium 4.7, Sodium 138, eGFR 40-47 11/21/2017 Creatinine 1.5, BUN 19, Potassium 4.5, Sodium 141,eGFR 35 - Care Everywhere 11/17/2017 Creatinine 1.6, BUN 30, Potassium 4.5, Sodium 140,eGFR 32 - Care Everywhere 07/26/2017 Creatinine 1.1, BUN 26, Potassium 4.3, Sodium 142, eGFR 50 - Care Everywhere 05/20/2017 Creatinine 1.3, BUN 25, Potassium 4.4, Sodium 141, eGFR 41 - Care Everywhere 01/25/2017 Creatinine 1.2, BUN 16, Potassium 4.3, Sodium 142, eGFR 45 - Care Everywhere 11/11/2016 Creatinine 1.4, BUN 26, Potassium 5.1, Sodium 141, eGFR 38 - Care Everywhere  04/27/2016 Creatinine 1.11, BUN 26, Potassium 4.6, Sdoium 141,eGFR 52-60  Recommendations: No changes and encouraged to call for fluid symptoms.   Follow-up plan: ICM clinic phone appointment on5/07/2018.  Copy of ICM check sent to [place].   3 month ICM trend: 07/07/2018    1 Year ICM trend:       Katherine Billings, RN 07/07/2018 10:31 AM

## 2018-07-19 ENCOUNTER — Telehealth: Payer: Self-pay

## 2018-07-19 ENCOUNTER — Other Ambulatory Visit: Payer: Self-pay | Admitting: Internal Medicine

## 2018-07-19 NOTE — Telephone Encounter (Signed)
Virtual Visit Pre-Appointment Phone Call    Confirm consent - "In the setting of the current Covid19 crisis, you are scheduled for a TELEPHONE visit with your provider on 08-08-18 at 9:00.  Just as we do with many in-office visits, in order for you to participate in this visit, we must obtain consent.  I can obtain your verbal consent now.  All virtual visits are billed to your insurance company just like a normal visit would be.  By agreeing to a virtual visit, we'd like you to understand that the technology does not allow for your provider to perform an examination, and thus may limit your provider's ability to fully assess your condition.  Finally, though the technology is pretty good, we cannot assure that it will always work on either your or our end, and in the setting of a video visit, we may have to convert it to a phone-only visit.  In either situation, we cannot ensure that we have a secure connection.  Are you willing to proceed? YES    TELEPHONE CALL NOTE  Katherine Rosales has been deemed a candidate for a follow-up tele-health visit to limit community exposure during the Covid-19 pandemic. I spoke with the patient via phone to ensure availability of phone/video source, confirm preferred email & phone number, and discuss instructions and expectations.  I reminded Katherine Rosales to be prepared with any vital sign and/or heart rhythm information that could potentially be obtained via home monitoring, at the time of her visit. I reminded Katherine Rosales to expect a phone call at the time of her visit if her visit.  [name], RMA 07/19/2018 2:29 PM       FULL LENGTH CONSENT FOR TELE-HEALTH VISIT   I hereby voluntarily request, consent and authorize [facility] and its employed or contracted physicians, physician assistants, nurse practitioners or other licensed health care professionals (the Practitioner), to provide me with telemedicine health care services (the  "Services") as deemed necessary by the treating Practitioner. I acknowledge and consent to receive the Services by the Practitioner via telemedicine. I understand that the telemedicine visit will involve communicating with the Practitioner through live audiovisual communication technology and the disclosure of certain medical information by electronic transmission. I acknowledge that I have been given the opportunity to request an in-person assessment or other available alternative prior to the telemedicine visit and am voluntarily participating in the telemedicine visit.  I understand that I have the right to withhold or withdraw my consent to the use of telemedicine in the course of my care at any time, without affecting my right to future care or treatment, and that the Practitioner or I may terminate the telemedicine visit at any time. I understand that I have the right to inspect all information obtained and/or recorded in the course of the telemedicine visit and may receive copies of available information for a reasonable fee.  I understand that some of the potential risks of receiving the Services via telemedicine include:  Marland Kitchen Delay or interruption in medical evaluation due to technological equipment failure or disruption; . Information transmitted may not be sufficient (e.g. poor resolution of images) to allow for appropriate medical decision making by the Practitioner; and/or  . In rare instances, security protocols could fail, causing a breach of personal health information.  Furthermore, I acknowledge that it is my responsibility to provide information about my medical history, conditions and care that is complete and accurate to the best of  my ability. I acknowledge that Practitioner's advice, recommendations, and/or decision may be based on factors not within their control, such as incomplete or inaccurate data provided by me or distortions of diagnostic images or specimens that may result from  electronic transmissions. I understand that the practice of medicine is not an exact science and that Practitioner makes no warranties or guarantees regarding treatment outcomes. I acknowledge that I will receive a copy of this consent concurrently upon execution via email to the email address I last provided but may also request a printed copy by calling the office of [place].    I understand that my insurance will be billed for this visit.   I have read or had this consent read to me. . I understand the contents of this consent, which adequately explains the benefits and risks of the Services being provided via telemedicine.  . I have been provided ample opportunity to ask questions regarding this consent and the Services and have had my questions answered to my satisfaction. . I give my informed consent for the services to be provided through the use of telemedicine in my medical care  By participating in this telemedicine visit I agree to the above.

## 2018-07-19 NOTE — Telephone Encounter (Signed)
This is a [place] pt 

## 2018-07-19 NOTE — Telephone Encounter (Signed)
Please review for refill, Thanks !  

## 2018-08-07 ENCOUNTER — Telehealth: Payer: Self-pay

## 2018-08-07 NOTE — Telephone Encounter (Signed)
Returned patient call as requested by voice mail message.  Patient said she had been trying to call the main office number regarding her virtual appt with Dr [name] tomorrow.  She asked how much is the visit and she said she was fine and not sure she really needed the appointment.  Advised it has been a year since her last visit but if she wants to change the visit to another time to call the [place] main office number.  She was not dialing the correct number and was reason she was unable to reach the office.  Provided the main office number and advised her to call so they can answer questions about the visit or change if needed.  Will check remote transmission tomorrow.

## 2018-08-08 ENCOUNTER — Other Ambulatory Visit: Payer: Self-pay

## 2018-08-08 ENCOUNTER — Telehealth (INDEPENDENT_AMBULATORY_CARE_PROVIDER_SITE_OTHER): Payer: Medicare HMO | Admitting: Internal Medicine

## 2018-08-08 ENCOUNTER — Ambulatory Visit (INDEPENDENT_AMBULATORY_CARE_PROVIDER_SITE_OTHER): Payer: Medicare HMO

## 2018-08-08 VITALS — Ht 59.0 in | Wt 227.0 lb

## 2018-08-08 DIAGNOSIS — Z95810 Presence of automatic (implantable) cardiac defibrillator: Secondary | ICD-10-CM

## 2018-08-08 DIAGNOSIS — I5022 Chronic systolic (congestive) heart failure: Secondary | ICD-10-CM | POA: Diagnosis not present

## 2018-08-08 DIAGNOSIS — I10 Essential (primary) hypertension: Secondary | ICD-10-CM

## 2018-08-08 DIAGNOSIS — I428 Other cardiomyopathies: Secondary | ICD-10-CM

## 2018-08-08 NOTE — Patient Instructions (Signed)
Medication Instructions:  - Your physician recommends that you continue on your current medications as directed. Please refer to the Current Medication list given to you today.  If you need a refill on your cardiac medications before your next appointment, please call your pharmacy.   Lab work: - none ordered  If you have labs (blood work) drawn today and your tests are completely normal, you will receive your results only by: Marland Kitchen MyChart Message (if you have MyChart) OR . A paper copy in the mail If you have any lab test that is abnormal or we need to change your treatment, we will call you to review the results.  Testing/Procedures: - Your physician has requested that you have an echocardiogram in 2-3 months. Echocardiography is a painless test that uses sound waves to create images of your heart. It provides your doctor with information about the size and shape of your heart and how well your heart's chambers and valves are working. This procedure takes approximately one hour. There are no restrictions for this procedure.  Follow-Up: At [facility], you and your health needs are our priority.  As part of our continuing mission to provide you with exceptional heart care, we have created designated Provider Care Teams.  These Care Teams include your primary Cardiologist (physician) and Advanced Practice Providers (APPs -  Physician Assistants and Nurse Practitioners) who all work together to provide you with the care you need, when you need it.  You will need a follow up appointment in 1 year with Dr. [name].  Marland Kitchen Please call our office 2 months in advance to schedule this appointment.  (Call in early March 2021 to schedule).  Remote monitoring is used to monitor your Pacemaker of ICD from home. This monitoring reduces the number of office visits required to check your device to one time per year. It allows [place] to keep an eye on the functioning of your device to ensure it is working properly.  You are scheduled for a device check from home on 08/14/2018 (full download) & /09/11/2018 Margarita Grizzle only- fluid check). You may send your transmission at any time that day. If you have a wireless device, the transmission will be sent automatically. After your physician reviews your transmission, you will receive a postcard with your next transmission date.   Any Other Special Instructions Will Be Listed Below (If Applicable). - N/A

## 2018-08-08 NOTE — Progress Notes (Signed)
EPIC Encounter for ICM Monitoring  Patient Name: Katherine Rosales is a 68 y.o. female Date: 08/08/2018 Primary Care Physican: Dion Body, MD Primary [place] Electrophysiologist: [name] LastWeight: 227lbs Today's Weight:not weighing   Transmission reviewed.   Report: Thoracic impedancereturned to normal since taking extra Furosemide.  Prescribed:Furosemide 20 mg 1 tablet dailyand PRN Furosemide if needed.   Labs: 12/27/2017 Creatinine 1.35, BUN 26, Potassium 4.7, Sodium 138, GFR 40-47 11/21/2017 Creatinine 1.5, BUN 19, Potassium 4.5, Sodium 141,GFR 35 - Care Everywhere 11/17/2017 Creatinine 1.6, BUN 30, Potassium 4.5, Sodium 140,GFR 32 - Care Everywhere 07/26/2017 Creatinine 1.1, BUN 26, Potassium 4.3, Sodium 142, GFR 50 - Care Everywhere 05/20/2017 Creatinine 1.3, BUN 25, Potassium 4.4, Sodium 141, GFR 41 - Care Everywhere 01/25/2017 Creatinine 1.2, BUN 16, Potassium 4.3, Sodium 142, GFR 45 - Care Everywhere 11/11/2016 Creatinine 1.4, BUN 26, Potassium 5.1, Sodium 141, GFR 38 - Care Everywhere  04/27/2016 Creatinine 1.11, BUN 26, Potassium 4.6, Sdoium 141,GFR 52-60  Recommendations: No changes.   Follow-up plan: ICM clinic phone appointment on6/11/2018.  Copy of ICM check sent to [place].  3 month ICM trend: 08/08/2018    1 Year ICM trend:       Katherine Billings, RN 08/08/2018 7:41 AM

## 2018-08-08 NOTE — Progress Notes (Signed)
Electrophysiology TeleHealth Note   Due to national recommendations of social distancing due to [place] 19, an audio/video telehealth visit is felt to be most appropriate for this patient at this time The patient did not have access to video technology/had technical difficulties with video requiring transitioning to audio format only (telephone).  All issues noted in this document were discussed and addressed.  No physical exam could be performed with this format.    .  See MyChart message from today for the patient's consent to telehealth for [address].   Date:  08/08/2018   ID:  Katherine Rosales, DOB 01/12/1951, MRN [id number]  Location: patient's home  Provider location: [address], [place]  Evaluation Performed: Follow-up visit  PCP:  Dion Body, MD  Cardiologist:     Electrophysiologist:  SK   Chief Complaint:  ICD and CHF  History of Present Illness:    Katherine Rosales is a 68 y.o. female who presents via audio/video conferencing for a telehealth visit today.  Since last being seen in our clinic, the patient reports doing quite well  Mood is much better, now 3 years after her brother died  The patient denies chest pain,  nocturnal dyspnea, orthopnea or peripheral edema.  There have been no palpitations, lightheadedness or syncope.  chonic modest SOB   Date Cr K Hgb  8/19 1.6 4.5 11.7  3/20 1/2  4.4 11.9   6/14 Echo EF 25-30%  The patient denies symptoms of fevers, chills, cough, or new SOB worrisome for COVID 19.    Past Medical History:  Diagnosis Date  . 9562 Lead-Medtronic   . Anemia 11/12/2016  . Anxiety 12/25/2013  . Automatic implantable cardioverter-defibrillator in situ   . Breast cancer ([place]) 03/1995   right breast ca with lumpectomy and chemo and rad  . Cardiomyopathy, nonischemic ([place])   . Chronic kidney disease 11/12/2016   stage 3  . Depression   . Diabetes mellitus    Type II Diabetes  . Hypertension   .  Hypothyroidism   . ICD (implantable cardiac defibrillator), singleMedtronic   . Personal history of chemotherapy 1996   right breast ca  . Personal history of radiation therapy 1996   right breast ca  . Sleep apnea    On CPAP  . Spleen enlarged   . Systolic heart failure, chronic (E[place])     Past Surgical History:  Procedure Laterality Date  . ABDOMINAL HYSTERECTOMY    . BREAST BIOPSY Right 11/26/2015   - FAT NECROSIS, FIBROSIS, AND CHRONIC INFLAMMATION.   Marland Kitchen BREAST EXCISIONAL BIOPSY Right 03/1995   + breast ca chemo and rad. 9:00 region  . BREAST EXCISIONAL BIOPSY Right 2011   surgical bx neg  11:00 region  . BREAST LUMPECTOMY    . CHOLECYSTECTOMY    . COLONOSCOPY  03/05/2013  . COLONOSCOPY W/ POLYPECTOMY  03/05/2013  . COLONOSCOPY WITH PROPOFOL N/A 10/03/2017   Procedure: COLONOSCOPY WITH PROPOFOL;  Surgeon: Manya Silvas, MD;  Location: [facility] ENDOSCOPY;  Service: Endoscopy;  Laterality: N/A;  . ESOPHAGOGASTRODUODENOSCOPY  01/11/2003, 06/25/2015  . ESOPHAGOGASTRODUODENOSCOPY (EGD) WITH PROPOFOL N/A 06/25/2015   Procedure: ESOPHAGOGASTRODUODENOSCOPY (EGD) WITH PROPOFOL;  Surgeon: Manya Silvas, MD;  Location: [facility] ENDOSCOPY;  Service: Endoscopy;  Laterality: N/A;  . FOOT SURGERY Right   . IMPLANTABLE CARDIOVERTER DEFIBRILLATOR (ICD) GENERATOR CHANGE N/A 11/15/2012   Procedure: ICD GENERATOR CHANGE;  Surgeon: Deboraha Sprang, MD;  Location: [facility] CATH LAB;  Service: Cardiovascular;  Laterality: N/A;  . JOINT REPLACEMENT  2009   Total Knee Replacement, Left  . LEAD REVISION N/A 11/15/2012   Procedure: LEAD REVISION;  Surgeon: Deboraha Sprang, MD;  [facility] C[facility] LAB;  Service: Cardiovascular;  Laterality: N/A;  . PACEMAKER INSERTION  06/30/2005   Medtronic Virtuoso VR ICD - Nonischemic cardiomyopathy potentially related to adriamycin, class 3 congestive failture with a narrow QRS and a negative TDI  . TONSILLECTOMY    . TUBAL LIGATION      Current Outpatient Medications   Medication Sig Dispense Refill  . aspirin 81 MG tablet Take 81 mg by mouth daily.    . Calcium Carbonate-Vitamin D (CALTRATE 600+D) 600-400 MG-UNIT per tablet Take 1 tablet by mouth 2 (two) times daily.      . carvedilol (COREG) 12.5 MG tablet TAKE 1 & 1/2 TABLET BY MOUTH TWICE DAILY WITH FOOD 84 tablet 0  . docusate sodium (COLACE) 100 MG capsule Take 100 mg by mouth 2 (two) times daily.      . furosemide (LASIX) 20 MG tablet Take 1 tablet by mouth daily.    Marland Kitchen gabapentin (NEURONTIN) 600 MG tablet Take 600 mg by mouth 2 (two) times daily.    Marland Kitchen glimepiride (AMARYL) 2 MG tablet Take 2 mg by mouth 2 (two) times daily before lunch and supper.     . levothyroxine (SYNTHROID, LEVOTHROID) 100 MCG tablet Take 100 mcg by mouth.     Marland Kitchen lisinopril (PRINIVIL,ZESTRIL) 20 MG tablet Take 20 mg by mouth daily.      Marland Kitchen loratadine (CLARITIN) 10 MG tablet Take 10 mg by mouth daily.      . Multiple Vitamins-Minerals (CENTRUM SILVER) tablet Take 1 tablet by mouth daily.      Marland Kitchen omeprazole (PRILOSEC) 20 MG capsule Take 20 mg by mouth daily.      . SYMBICORT 160-4.5 MCG/ACT inhaler Inhale 2 puffs into the lungs as needed.     . citalopram (CELEXA) 20 MG tablet Take 1 tablet by mouth daily. WILL BEGIN TO TAKE AFTER TITRATE DOWN OFF OF ZOLOFT    . furosemide (LASIX) 20 MG tablet Take 1 tablet (20 mg total) by mouth daily as needed for fluid or edema. (Patient not taking: Reported on 08/08/2018) 30 tablet 0   No current facility-administered medications for this visit.     Allergies:   Patient has no known allergies.   Social History:  The patient  reports that she has never smoked. She has never used smokeless tobacco. She reports that she does not drink alcohol or use drugs.   Family History:  The patient's   family history includes Prostate cancer in her father.   ROS:  Please see the history of present illness.   All other systems are personally reviewed and negative.    Exam:    Vital Signs:  Ht 4' 11"  (1.499  m)   Wt 227 lb (103 kg)   BMI 45.85 kg/m      BP 134/74 IM visit 06/20/18  Labs/Other Tests and Data Reviewed:    Recent Labs: No results found for requested labs within last 8760 hours.   Wt Readings from Last 3 Encounters:  08/08/18 227 lb (103 kg)  05/17/17 233 lb (105.7 kg)  10/18/16 238 lb 1.6 oz (108 kg)     Other studies personally reviewed: Additional studies/ records that were reviewed today include As above     Last device remote is reviewed from [place] PDF dated 2/20 which reveals  normal device function,   arrhythmias - none    ASSESSMENT & PLAN:    Nonischemic cardiomyopathy  Heart failure Chronic systolic  Hypertension     Depression   PVCs- Trigeminy   Implantable defibrillator-Medtronic  VVI  The patient's device was interrogated.  The information was reviewed. No changes were made in the programming.     Sleep apnea  -- compliant  Chronic kidney disease*  Kidney function better  Euvolemic by report  continue current meds   BP well controlled   Depression and mood better    With LV dysfunction previously recorded ( and most recently 2014) she would if still in this range be a candidate for medication optimization with entresto and/or aldactone  Have reviewed with her   COVID 19 screen The patient denies symptoms of COVID 19 at this time.  The importance of social distancing was discussed today.  Follow-up:  61 m Next remote: As Scheduled   Current medicines are reviewed at length with the patient today.   The patient does not have concerns regarding her medicines.  The following changes were made today:  none  Labs/ tests ordered today include:  No orders of the defined types were placed in this encounter.   Future tests ( post COVID )  Echo  in 2-3  months  Patient Risk:  after full review of this patients clinical status, I feel that they are at moderate risk at this time.  Today, I have spent 5 minutes with the patient  with telehealth technology discussing the above.  Signed, [name], MD  08/08/2018 9:03 AM     [address] [place] [address] [phone] (office) [phone] (fax)

## 2018-08-14 ENCOUNTER — Ambulatory Visit (INDEPENDENT_AMBULATORY_CARE_PROVIDER_SITE_OTHER): Payer: Medicare HMO | Admitting: *Deleted

## 2018-08-14 ENCOUNTER — Other Ambulatory Visit: Payer: Self-pay | Admitting: Student

## 2018-08-14 ENCOUNTER — Other Ambulatory Visit: Payer: Self-pay

## 2018-08-14 DIAGNOSIS — K7460 Unspecified cirrhosis of liver: Secondary | ICD-10-CM

## 2018-08-14 DIAGNOSIS — I428 Other cardiomyopathies: Secondary | ICD-10-CM

## 2018-08-14 DIAGNOSIS — I5022 Chronic systolic (congestive) heart failure: Secondary | ICD-10-CM

## 2018-08-14 DIAGNOSIS — K7581 Nonalcoholic steatohepatitis (NASH): Secondary | ICD-10-CM | POA: Diagnosis not present

## 2018-08-14 DIAGNOSIS — D693 Immune thrombocytopenic purpura: Secondary | ICD-10-CM

## 2018-08-15 LAB — CUP PACEART REMOTE DEVICE CHECK
Battery Remaining Longevity: 75 mo
Battery Voltage: 3 V
Brady Statistic RV Percent Paced: 0.02 %
Date Time Interrogation Session: 20200511041701
HighPow Impedance: 103 Ohm
Implantable Lead Implant Date: 20140813
Implantable Lead Location: 753860
Implantable Lead Model: 6935
Implantable Pulse Generator Implant Date: 20140813
Lead Channel Impedance Value: 380 Ohm
Lead Channel Impedance Value: 456 Ohm
Lead Channel Pacing Threshold Amplitude: 0.75 V
Lead Channel Pacing Threshold Pulse Width: 0.4 ms
Lead Channel Sensing Intrinsic Amplitude: 12 mV
Lead Channel Sensing Intrinsic Amplitude: 12 mV
Lead Channel Setting Pacing Amplitude: 2 V
Lead Channel Setting Pacing Pulse Width: 0.4 ms
Lead Channel Setting Sensing Sensitivity: 0.3 mV

## 2018-08-29 NOTE — Progress Notes (Signed)
Remote ICD transmission.   

## 2018-08-30 ENCOUNTER — Ambulatory Visit
Admission: RE | Admit: 2018-08-30 | Discharge: 2018-08-30 | Disposition: A | Discharge status: Home / Self Care | Payer: Medicare HMO | Source: Ambulatory Visit | Attending: Student | Admitting: Student

## 2018-08-30 ENCOUNTER — Other Ambulatory Visit: Payer: Self-pay

## 2018-08-30 DIAGNOSIS — K7460 Unspecified cirrhosis of liver: Secondary | ICD-10-CM | POA: Diagnosis not present

## 2018-08-30 DIAGNOSIS — D693 Immune thrombocytopenic purpura: Secondary | ICD-10-CM | POA: Insufficient documentation

## 2018-08-30 DIAGNOSIS — K7581 Nonalcoholic steatohepatitis (NASH): Secondary | ICD-10-CM | POA: Insufficient documentation

## 2018-08-30 DIAGNOSIS — R161 Splenomegaly, not elsewhere classified: Secondary | ICD-10-CM | POA: Diagnosis not present

## 2018-08-30 DIAGNOSIS — N289 Disorder of kidney and ureter, unspecified: Secondary | ICD-10-CM | POA: Diagnosis not present

## 2018-08-31 ENCOUNTER — Other Ambulatory Visit: Payer: Self-pay | Admitting: Student

## 2018-09-01 ENCOUNTER — Other Ambulatory Visit: Payer: Self-pay | Admitting: Student

## 2018-09-01 DIAGNOSIS — N289 Disorder of kidney and ureter, unspecified: Secondary | ICD-10-CM

## 2018-09-08 ENCOUNTER — Ambulatory Visit
Admission: RE | Admit: 2018-09-08 | Discharge: 2018-09-08 | Disposition: A | Discharge status: Home / Self Care | Payer: Medicare HMO | Source: Ambulatory Visit | Attending: Student | Admitting: Student

## 2018-09-08 ENCOUNTER — Other Ambulatory Visit: Payer: Self-pay

## 2018-09-08 DIAGNOSIS — N289 Disorder of kidney and ureter, unspecified: Secondary | ICD-10-CM | POA: Diagnosis not present

## 2018-09-08 DIAGNOSIS — N281 Cyst of kidney, acquired: Secondary | ICD-10-CM | POA: Diagnosis not present

## 2018-09-08 LAB — POCT I-STAT CREATININE: Creatinine, Ser: 1.3 mg/dL — ABNORMAL HIGH (ref 0.44–1.00)

## 2018-09-08 MED ORDER — IOHEXOL 300 MG/ML  SOLN
75.0000 mL | Freq: Once | INTRAMUSCULAR | Status: AC | PRN
  Administered 2018-09-08: 75 mL via INTRAVENOUS

## 2018-09-11 ENCOUNTER — Ambulatory Visit (INDEPENDENT_AMBULATORY_CARE_PROVIDER_SITE_OTHER): Payer: Medicare HMO

## 2018-09-11 DIAGNOSIS — Z95810 Presence of automatic (implantable) cardiac defibrillator: Secondary | ICD-10-CM | POA: Diagnosis not present

## 2018-09-11 DIAGNOSIS — I5022 Chronic systolic (congestive) heart failure: Secondary | ICD-10-CM

## 2018-09-12 ENCOUNTER — Other Ambulatory Visit: Payer: Self-pay | Admitting: Cardiovascular Disease

## 2018-09-15 NOTE — Progress Notes (Signed)
EPIC Encounter for ICM Monitoring  Patient Name: Katherine Rosales is a 68 y.o. female Date: 09/15/2018 Primary Care Physican: Dion Body, MD Primary [place] Electrophysiologist: [name] 6/12/2020Weight: 220lbs   Transmission reviewed. Spoke with patient.  She said she could tell She said she took extra fluid pill 09/14/2018   Optivol thoracic impedancetrending towardnormal.  Prescribed:Furosemide 20 mg 1 tablet dailyand PRN Furosemide if needed.   Labs: 09/08/2018 Creatinine 1.30 12/27/2017 Creatinine 1.35, BUN 26, Potassium 4.7, Sodium 138, GFR 40-47 11/21/2017 Creatinine 1.5, BUN 19, Potassium 4.5, Sodium 141,GFR 35 - Care Everywhere 11/17/2017 Creatinine 1.6, BUN 30, Potassium 4.5, Sodium 140,GFR 32 - Care Everywhere 07/26/2017 Creatinine 1.1, BUN 26, Potassium 4.3, Sodium 142, GFR 50 - Care Everywhere 05/20/2017 Creatinine 1.3, BUN 25, Potassium 4.4, Sodium 141, GFR 41 - Care Everywhere 01/25/2017 Creatinine 1.2, BUN 16, Potassium 4.3, Sodium 142, GFR 45 - Care Everywhere 11/11/2016 Creatinine 1.4, BUN 26, Potassium 5.1, Sodium 141, GFR 38 - Care Everywhere  04/27/2016 Creatinine 1.11, BUN 26, Potassium 4.6, Sdoium 141,GFR 52-60  Recommendations: No changes and encouraged to call for fluid symptoms.   Follow-up plan: ICM clinic phone appointment on7/13/2020.  Copy of ICM check sent to [place].  3 month ICM trend: 09/11/2018    1 Year ICM trend:       [name], RN 09/15/2018 8:00 AM

## 2018-10-12 ENCOUNTER — Other Ambulatory Visit: Payer: Self-pay | Admitting: Family Medicine

## 2018-10-12 DIAGNOSIS — Z1231 Encounter for screening mammogram for malignant neoplasm of breast: Secondary | ICD-10-CM

## 2018-10-13 DIAGNOSIS — D693 Immune thrombocytopenic purpura: Secondary | ICD-10-CM | POA: Diagnosis not present

## 2018-10-13 DIAGNOSIS — E1143 Type 2 diabetes mellitus with diabetic autonomic (poly)neuropathy: Secondary | ICD-10-CM | POA: Diagnosis not present

## 2018-10-13 DIAGNOSIS — K7460 Unspecified cirrhosis of liver: Secondary | ICD-10-CM | POA: Diagnosis not present

## 2018-10-13 DIAGNOSIS — E039 Hypothyroidism, unspecified: Secondary | ICD-10-CM | POA: Diagnosis not present

## 2018-10-13 DIAGNOSIS — K7581 Nonalcoholic steatohepatitis (NASH): Secondary | ICD-10-CM | POA: Diagnosis not present

## 2018-10-13 DIAGNOSIS — N183 Chronic kidney disease, stage 3 (moderate): Secondary | ICD-10-CM | POA: Diagnosis not present

## 2018-10-13 DIAGNOSIS — D638 Anemia in other chronic diseases classified elsewhere: Secondary | ICD-10-CM | POA: Diagnosis not present

## 2018-10-16 ENCOUNTER — Ambulatory Visit (INDEPENDENT_AMBULATORY_CARE_PROVIDER_SITE_OTHER): Payer: Medicare HMO

## 2018-10-16 DIAGNOSIS — I5022 Chronic systolic (congestive) heart failure: Secondary | ICD-10-CM | POA: Diagnosis not present

## 2018-10-16 DIAGNOSIS — Z95810 Presence of automatic (implantable) cardiac defibrillator: Secondary | ICD-10-CM | POA: Diagnosis not present

## 2018-10-20 DIAGNOSIS — F331 Major depressive disorder, recurrent, moderate: Secondary | ICD-10-CM | POA: Diagnosis not present

## 2018-10-20 DIAGNOSIS — F411 Generalized anxiety disorder: Secondary | ICD-10-CM | POA: Diagnosis not present

## 2018-10-20 DIAGNOSIS — N183 Chronic kidney disease, stage 3 (moderate): Secondary | ICD-10-CM | POA: Diagnosis not present

## 2018-10-20 DIAGNOSIS — E039 Hypothyroidism, unspecified: Secondary | ICD-10-CM | POA: Diagnosis not present

## 2018-10-20 DIAGNOSIS — E1143 Type 2 diabetes mellitus with diabetic autonomic (poly)neuropathy: Secondary | ICD-10-CM | POA: Diagnosis not present

## 2018-10-20 NOTE — Progress Notes (Signed)
EPIC Encounter for ICM Monitoring  Patient Name: Katherine Rosales is a 68 y.o. female Date: 10/20/2018 Primary Care Physican: Dion Body, MD Primary [place] Electrophysiologist: [name] 6/12/2020Weight: 220lbs 10/20/2018 Weight: 218 lbs   Transmission reviewed.Spoke with patient and she said she is feeling fine.  She said her A1C has been high and the medication may need to be changed.   Optivol thoracic impedancetending close to baseline normal.  Prescribed:Furosemide 20 mg 1 tablet dailyand PRN Furosemide if needed.   Labs: 09/08/2018 Creatinine 1.30 12/27/2017 Creatinine 1.35, BUN 26, Potassium 4.7, Sodium 138, GFR 40-47  Recommendations: No changes and encouraged to call for fluid symptoms.   Follow-up plan: ICM clinic phone appointment on8/17/2020.  Copy of ICM check sent to [place].  3 month ICM trend: 10/16/2018    1 Year ICM trend:       [name], RN 10/20/2018 9:42 AM

## 2018-10-30 ENCOUNTER — Telehealth: Payer: Self-pay

## 2018-10-30 NOTE — Telephone Encounter (Signed)
Returned call to patient as requested by voice mail message regarding [place] office number.  She said she called the [place] office today and obtained the [place] office number so she can schedule an appointment with Dr [name].  No further questions.

## 2018-11-07 DIAGNOSIS — R69 Illness, unspecified: Secondary | ICD-10-CM | POA: Diagnosis not present

## 2018-11-13 ENCOUNTER — Ambulatory Visit (INDEPENDENT_AMBULATORY_CARE_PROVIDER_SITE_OTHER): Payer: Medicare HMO | Admitting: *Deleted

## 2018-11-13 DIAGNOSIS — I428 Other cardiomyopathies: Secondary | ICD-10-CM

## 2018-11-13 LAB — CUP PACEART REMOTE DEVICE CHECK
Battery Remaining Longevity: 71 mo
Battery Voltage: 3 V
Brady Statistic RV Percent Paced: 0.03 %
Date Time Interrogation Session: 20200810052303
HighPow Impedance: 81 Ohm
Implantable Lead Implant Date: 20140813
Implantable Lead Location: 753860
Implantable Lead Model: 6935
Implantable Pulse Generator Implant Date: 20140813
Lead Channel Impedance Value: 342 Ohm
Lead Channel Impedance Value: 456 Ohm
Lead Channel Pacing Threshold Amplitude: 0.875 V
Lead Channel Pacing Threshold Pulse Width: 0.4 ms
Lead Channel Sensing Intrinsic Amplitude: 13.25 mV
Lead Channel Sensing Intrinsic Amplitude: 13.25 mV
Lead Channel Setting Pacing Amplitude: 2 V
Lead Channel Setting Pacing Pulse Width: 0.4 ms
Lead Channel Setting Sensing Sensitivity: 0.3 mV

## 2018-11-20 ENCOUNTER — Ambulatory Visit
Admission: RE | Admit: 2018-11-20 | Discharge: 2018-11-20 | Disposition: A | Discharge status: Home / Self Care | Payer: Medicare HMO | Source: Ambulatory Visit | Attending: Family Medicine | Admitting: Family Medicine

## 2018-11-20 ENCOUNTER — Ambulatory Visit (INDEPENDENT_AMBULATORY_CARE_PROVIDER_SITE_OTHER): Payer: Medicare HMO

## 2018-11-20 DIAGNOSIS — Z1231 Encounter for screening mammogram for malignant neoplasm of breast: Secondary | ICD-10-CM | POA: Diagnosis not present

## 2018-11-20 DIAGNOSIS — I5022 Chronic systolic (congestive) heart failure: Secondary | ICD-10-CM

## 2018-11-20 DIAGNOSIS — Z95810 Presence of automatic (implantable) cardiac defibrillator: Secondary | ICD-10-CM

## 2018-11-21 ENCOUNTER — Encounter: Payer: Self-pay | Admitting: Cardiology

## 2018-11-21 ENCOUNTER — Other Ambulatory Visit: Payer: Self-pay | Admitting: Family Medicine

## 2018-11-21 DIAGNOSIS — R928 Other abnormal and inconclusive findings on diagnostic imaging of breast: Secondary | ICD-10-CM

## 2018-11-21 NOTE — Progress Notes (Signed)
Remote ICD transmission.   

## 2018-11-22 NOTE — Progress Notes (Signed)
EPIC Encounter for ICM Monitoring  Patient Name: Katherine Rosales is a 68 y.o. female Date: 11/22/2018 Primary Care Physican: Dion Body, MD Primary [place] Electrophysiologist: [name] 10/20/2018 Weight: 218 lbs   Transmission reviewed.Spoke with patient and she said she is feeling fine.    Optivol thoracic impedancenormal.  Prescribed:Furosemide 20 mg 1 tablet dailyand PRN Furosemide if needed.   Labs: 09/08/2018 Creatinine 1.30 12/27/2017 Creatinine 1.35, BUN 26, Potassium 4.7, Sodium 138, GFR 40-47  Recommendations: No changes and encouraged to call for fluid symptoms.  Follow-up plan: ICM clinic phone appointment on10/08/2018.  Copy of ICM check sent to [place].  3 month ICM trend: 11/20/2018    1 Year ICM trend:       [name], RN 11/22/2018 5:08 PM

## 2018-11-28 ENCOUNTER — Other Ambulatory Visit: Payer: Medicare HMO

## 2018-12-08 ENCOUNTER — Other Ambulatory Visit: Payer: Self-pay

## 2018-12-08 ENCOUNTER — Ambulatory Visit
Admission: RE | Admit: 2018-12-08 | Discharge: 2018-12-08 | Disposition: A | Discharge status: Home / Self Care | Payer: Medicare HMO | Source: Ambulatory Visit | Attending: Family Medicine | Admitting: Family Medicine

## 2018-12-08 ENCOUNTER — Ambulatory Visit (INDEPENDENT_AMBULATORY_CARE_PROVIDER_SITE_OTHER): Payer: Medicare HMO

## 2018-12-08 DIAGNOSIS — I5022 Chronic systolic (congestive) heart failure: Secondary | ICD-10-CM | POA: Diagnosis not present

## 2018-12-08 DIAGNOSIS — R928 Other abnormal and inconclusive findings on diagnostic imaging of breast: Secondary | ICD-10-CM

## 2018-12-08 DIAGNOSIS — N6489 Other specified disorders of breast: Secondary | ICD-10-CM | POA: Diagnosis not present

## 2018-12-12 ENCOUNTER — Telehealth: Payer: Self-pay

## 2018-12-12 DIAGNOSIS — Z79899 Other long term (current) drug therapy: Secondary | ICD-10-CM

## 2018-12-12 DIAGNOSIS — I5022 Chronic systolic (congestive) heart failure: Secondary | ICD-10-CM

## 2018-12-12 NOTE — Telephone Encounter (Signed)
To Dr. [name] to review- patient is calling for echo results.  These are in your in-basket.   Thanks!

## 2018-12-12 NOTE — Telephone Encounter (Signed)
Received voice mail message from patient asking for echo results that were done on 12/08/2018.  Routed to [facility] Triage for follow up.

## 2018-12-13 NOTE — Telephone Encounter (Signed)
Patient calling back to learn updated status of echo results.

## 2018-12-14 NOTE — Telephone Encounter (Signed)
I called and spoke with the patient. I advised her that Dr. [name] has not yet signed off on her echo report, but prelim results were given.   I advised I will have to call her back once Dr. [name] reviews since according to her e-visit with him in May, he may want to make some medication adjustment dependent on what these results showed.   The patient voices understanding and is agreeable. She is aware that Dr. [name] will be out until ~ 9/21.  The patient states she had breast cancer with chemo/ radiation/ lumpectomy back in 1996. She has recently had a repeat mammogram, breast US, and now will be undergoing a bx of her right breast again. She is understandably concerned about what these results will show.

## 2018-12-15 ENCOUNTER — Other Ambulatory Visit: Payer: Self-pay | Admitting: Family Medicine

## 2018-12-15 DIAGNOSIS — R928 Other abnormal and inconclusive findings on diagnostic imaging of breast: Secondary | ICD-10-CM

## 2018-12-18 NOTE — Telephone Encounter (Signed)
Heather The other issue was meds related to persistent LFV dysfunction  Can we begin her on aldactone 12.5 and check a BMET in a few weeks And then we can think of lisinopril>>entresto (can we set a PA visit for that in about a month Thanks SK

## 2018-12-20 ENCOUNTER — Other Ambulatory Visit: Payer: Self-pay | Admitting: Family Medicine

## 2018-12-20 ENCOUNTER — Ambulatory Visit
Admission: RE | Admit: 2018-12-20 | Discharge: 2018-12-20 | Disposition: A | Discharge status: Home / Self Care | Payer: Medicare HMO | Source: Ambulatory Visit | Attending: Family Medicine | Admitting: Family Medicine

## 2018-12-20 DIAGNOSIS — R928 Other abnormal and inconclusive findings on diagnostic imaging of breast: Secondary | ICD-10-CM

## 2018-12-20 DIAGNOSIS — N6311 Unspecified lump in the right breast, upper outer quadrant: Secondary | ICD-10-CM | POA: Diagnosis not present

## 2018-12-20 DIAGNOSIS — N6081 Other benign mammary dysplasias of right breast: Secondary | ICD-10-CM | POA: Diagnosis not present

## 2018-12-20 HISTORY — PX: BREAST BIOPSY: SHX20

## 2018-12-20 MED ORDER — SPIRONOLACTONE 25 MG PO TABS: 12.5000 mg | ORAL_TABLET | Freq: Every day | ORAL | 6 refills | Status: DC

## 2018-12-20 NOTE — Telephone Encounter (Signed)
Attempted to call the patient. No answer- I left a message to please call back.  

## 2018-12-20 NOTE — Telephone Encounter (Signed)
I spoke with the patient regarding Dr. Olin Pia recommendations based on her echo results. She is aware to: - start aldactone 12.5 mg once daily - repeat a BMP in 1-2 weeks at the [facility] - follow up in 1 month with Dr. [name].  The patient is agreeable with all of the above recommendations and voices understanding.   RX sent to [facility]. BMP order placed - the patient is aware to go to the [facility] lab between 9/24-10/2 for a repeat lab draw. Appt scheduled for 10/20 with Dr. [name].

## 2018-12-20 NOTE — Telephone Encounter (Signed)
Patient returning voicemail, please call back when able at [phone]

## 2019-01-02 DIAGNOSIS — R928 Other abnormal and inconclusive findings on diagnostic imaging of breast: Secondary | ICD-10-CM | POA: Diagnosis not present

## 2019-01-03 ENCOUNTER — Other Ambulatory Visit: Payer: Self-pay | Admitting: Internal Medicine

## 2019-01-05 LAB — SURGICAL PATHOLOGY

## 2019-01-08 ENCOUNTER — Ambulatory Visit (INDEPENDENT_AMBULATORY_CARE_PROVIDER_SITE_OTHER): Payer: Medicare HMO

## 2019-01-08 DIAGNOSIS — I5022 Chronic systolic (congestive) heart failure: Secondary | ICD-10-CM

## 2019-01-08 DIAGNOSIS — Z95810 Presence of automatic (implantable) cardiac defibrillator: Secondary | ICD-10-CM | POA: Diagnosis not present

## 2019-01-09 ENCOUNTER — Other Ambulatory Visit: Payer: Medicare HMO

## 2019-01-09 ENCOUNTER — Other Ambulatory Visit
Admission: RE | Admit: 2019-01-09 | Discharge: 2019-01-09 | Disposition: A | Discharge status: Home / Self Care | Payer: Medicare HMO | Source: Ambulatory Visit | Attending: Internal Medicine | Admitting: Internal Medicine

## 2019-01-09 DIAGNOSIS — N1832 Chronic kidney disease, stage 3b: Secondary | ICD-10-CM | POA: Diagnosis not present

## 2019-01-09 DIAGNOSIS — R809 Proteinuria, unspecified: Secondary | ICD-10-CM | POA: Insufficient documentation

## 2019-01-09 DIAGNOSIS — N183 Chronic kidney disease, stage 3 unspecified: Secondary | ICD-10-CM | POA: Insufficient documentation

## 2019-01-09 DIAGNOSIS — E1122 Type 2 diabetes mellitus with diabetic chronic kidney disease: Secondary | ICD-10-CM | POA: Diagnosis not present

## 2019-01-09 DIAGNOSIS — I5022 Chronic systolic (congestive) heart failure: Secondary | ICD-10-CM | POA: Insufficient documentation

## 2019-01-09 DIAGNOSIS — E119 Type 2 diabetes mellitus without complications: Secondary | ICD-10-CM | POA: Insufficient documentation

## 2019-01-09 DIAGNOSIS — Z79899 Other long term (current) drug therapy: Secondary | ICD-10-CM | POA: Diagnosis not present

## 2019-01-09 DIAGNOSIS — N041 Nephrotic syndrome with focal and segmental glomerular lesions: Secondary | ICD-10-CM | POA: Diagnosis not present

## 2019-01-09 DIAGNOSIS — N2581 Secondary hyperparathyroidism of renal origin: Secondary | ICD-10-CM | POA: Insufficient documentation

## 2019-01-09 DIAGNOSIS — I129 Hypertensive chronic kidney disease with stage 1 through stage 4 chronic kidney disease, or unspecified chronic kidney disease: Secondary | ICD-10-CM | POA: Insufficient documentation

## 2019-01-09 DIAGNOSIS — N051 Unspecified nephritic syndrome with focal and segmental glomerular lesions: Secondary | ICD-10-CM | POA: Insufficient documentation

## 2019-01-09 LAB — BASIC METABOLIC PANEL
Anion gap: 9 (ref 5–15)
BUN: 25 mg/dL — ABNORMAL HIGH (ref 8–23)
CO2: 27 mmol/L (ref 22–32)
Calcium: 9.9 mg/dL (ref 8.9–10.3)
Chloride: 101 mmol/L (ref 98–111)
Creatinine, Ser: 1.21 mg/dL — ABNORMAL HIGH (ref 0.44–1.00)
GFR calc Af Amer: 53 mL/min — ABNORMAL LOW (ref >60)
GFR calc non Af Amer: 46 mL/min — ABNORMAL LOW (ref >60)
Glucose, Bld: 198 mg/dL — ABNORMAL HIGH (ref 70–99)
Potassium: 4.1 mmol/L (ref 3.5–5.1)
Sodium: 137 mmol/L (ref 135–145)

## 2019-01-09 NOTE — Addendum Note (Signed)
Addended by: Janan Ridge on: 01/09/2019 01:43 PM   Modules accepted: Orders

## 2019-01-10 NOTE — Progress Notes (Signed)
EPIC Encounter for ICM Monitoring  Patient Name: Katherine Rosales is a 68 y.o. female Date: 01/10/2019 Primary Care Physican: Dion Body, MD Primary [place] Electrophysiologist: [name] 01/10/2019 Weight: 220 lbs  Transmission reviewed.Spoke with patientandshe said she is feeling fine. Advised needs to update DPR at the office appointment on 10/20.  Optivol thoracic impedancenormal.  Prescribed:Furosemide 20 mg 1 tablet dailyand PRN Furosemide if needed.   Labs: 01/09/2019 Creatinine 1.21, BUN 25, Potassium 4.1, Sodium 137, GFR 46-53 09/08/2018 Creatinine 1.30 12/27/2017 Creatinine 1.35, BUN 26, Potassium 4.7, Sodium 138, GFR 40-47  Recommendations: No changes and encouraged to call if experiencing any fluid symptoms.  Follow-up plan: ICM clinic phone appointment on 02/13/2019.   91 day device clinic remote transmission 02/12/2019.  Office appt 01/23/2019 with Dr. [name].    Copy of ICM check sent to Dr. [name].   3 month ICM trend: 01/08/2019    1 Year ICM trend:       [name], RN 01/10/2019 9:50 AM

## 2019-01-16 ENCOUNTER — Telehealth: Payer: Self-pay | Admitting: Internal Medicine

## 2019-01-16 NOTE — Telephone Encounter (Signed)
Patient calling to discuss recent testing results  ° °Please call  ° °

## 2019-01-16 NOTE — Telephone Encounter (Signed)
I spoke with the patient regarding her results.

## 2019-01-23 ENCOUNTER — Encounter: Payer: Medicare HMO | Admitting: Internal Medicine

## 2019-02-12 ENCOUNTER — Ambulatory Visit (INDEPENDENT_AMBULATORY_CARE_PROVIDER_SITE_OTHER): Payer: Medicare HMO | Admitting: *Deleted

## 2019-02-12 DIAGNOSIS — I428 Other cardiomyopathies: Secondary | ICD-10-CM

## 2019-02-12 DIAGNOSIS — I5022 Chronic systolic (congestive) heart failure: Secondary | ICD-10-CM | POA: Diagnosis not present

## 2019-02-13 ENCOUNTER — Ambulatory Visit (INDEPENDENT_AMBULATORY_CARE_PROVIDER_SITE_OTHER): Payer: Medicare HMO

## 2019-02-13 DIAGNOSIS — I5022 Chronic systolic (congestive) heart failure: Secondary | ICD-10-CM | POA: Diagnosis not present

## 2019-02-13 DIAGNOSIS — Z95810 Presence of automatic (implantable) cardiac defibrillator: Secondary | ICD-10-CM | POA: Diagnosis not present

## 2019-02-13 LAB — CUP PACEART REMOTE DEVICE CHECK
Battery Remaining Longevity: 68 mo
Battery Voltage: 2.99 V
Brady Statistic RV Percent Paced: 0.05 %
Date Time Interrogation Session: 20201109072602
HighPow Impedance: 87 Ohm
Implantable Lead Implant Date: 20140813
Implantable Lead Location: 753860
Implantable Lead Model: 6935
Implantable Pulse Generator Implant Date: 20140813
Lead Channel Impedance Value: 342 Ohm
Lead Channel Impedance Value: 399 Ohm
Lead Channel Pacing Threshold Amplitude: 0.75 V
Lead Channel Pacing Threshold Pulse Width: 0.4 ms
Lead Channel Sensing Intrinsic Amplitude: 13.375 mV
Lead Channel Sensing Intrinsic Amplitude: 13.375 mV
Lead Channel Setting Pacing Amplitude: 2 V
Lead Channel Setting Pacing Pulse Width: 0.4 ms
Lead Channel Setting Sensing Sensitivity: 0.3 mV

## 2019-02-16 DIAGNOSIS — N183 Chronic kidney disease, stage 3 unspecified: Secondary | ICD-10-CM | POA: Diagnosis not present

## 2019-02-16 DIAGNOSIS — E1143 Type 2 diabetes mellitus with diabetic autonomic (poly)neuropathy: Secondary | ICD-10-CM | POA: Diagnosis not present

## 2019-02-16 DIAGNOSIS — E039 Hypothyroidism, unspecified: Secondary | ICD-10-CM | POA: Diagnosis not present

## 2019-02-16 NOTE — Progress Notes (Signed)
EPIC Encounter for ICM Monitoring  Patient Name: Katherine Rosales is a 68 y.o. female Date: 02/16/2019 Primary Care Physican: Dion Body, MD Primary [place] Electrophysiologist: [name] 01/10/2019 Weight: 220 lbs  Transmission reviewed.Spoke with patientandshe said she is feeling fine. Advised needs to update DPR at the office appointment on 10/20.  Optivol thoracic impedancenormal.  Prescribed:Furosemide 20 mg 1 tablet dailyand PRN Furosemide if needed.   Labs: 01/09/2019 Creatinine 1.21, BUN 25, Potassium 4.1, Sodium 137, GFR 46-53 09/08/2018 Creatinine 1.30 12/27/2017 Creatinine 1.35, BUN 26, Potassium 4.7, Sodium 138, GFR 40-47  Recommendations: No changes and encouraged to call if experiencing any fluid symptoms.  Follow-up plan: ICM clinic phone appointment on 03/26/2019.   91 day device clinic remote transmission 05/14/2019.  Office appt 02/20/2019 with Dr. [name].    Copy of ICM check sent to Dr. [name].   3 month ICM trend: 02/16/2019    1 Year ICM trend:       [name], RN 02/16/2019 1:19 PM

## 2019-02-20 ENCOUNTER — Other Ambulatory Visit: Payer: Self-pay

## 2019-02-20 ENCOUNTER — Ambulatory Visit: Payer: Medicare HMO | Admitting: Internal Medicine

## 2019-02-20 ENCOUNTER — Encounter: Payer: Self-pay | Admitting: Internal Medicine

## 2019-02-20 VITALS — BP 110/74 | HR 71 | Temp 97.3°F | Ht 59.0 in | Wt 217.5 lb

## 2019-02-20 DIAGNOSIS — I5022 Chronic systolic (congestive) heart failure: Secondary | ICD-10-CM | POA: Diagnosis not present

## 2019-02-20 DIAGNOSIS — I428 Other cardiomyopathies: Secondary | ICD-10-CM

## 2019-02-20 DIAGNOSIS — Z95810 Presence of automatic (implantable) cardiac defibrillator: Secondary | ICD-10-CM | POA: Diagnosis not present

## 2019-02-20 NOTE — Progress Notes (Signed)
Patient Care Team: Dion Body, MD as PCP - General (Family Medicine) Deboraha Sprang, MD as PCP - Cardiology (Cardiology)   HPI  Katherine Rosales is a 68 y.o. female Seen in followup for Medtronic  ICD implanted for nonischemic cardiac myopathy and primary prevention. She has chronic systolic failure. She underwent generator replacement in 8/14.     She has known history of chronic hepatitis. Followed locally and at [facility];      Date Cr K Hgb  8/19 1.6 4.5 11.7  3/20 1/2  4.4 11.9  10/20 1.2 4.1 11.6     DATE TEST EF   6/14 Echo   20-25 %   9/20 Echo   30-35 %         Litany of medical events this year.  And very expensive, $45 here $45 there.  She states she has $5 in her checking account until midnight.  Breathing is stable.  No nocturnal dyspnea.  No edema.  No chest pain.  Currently not needing to use her as needed diuretic  She has a history of sleep apnea.  But is become compliant with her CPAP.  She is resting better. Past Medical History:  Diagnosis Date  . 7510 Lead-Medtronic   . Anemia 11/12/2016  . Anxiety 12/25/2013  . Automatic implantable cardioverter-defibrillator in situ   . Breast cancer ([place]) 03/1995   right breast ca with lumpectomy and chemo and rad  . Cardiomyopathy, nonischemic ([place])   . Chronic kidney disease 11/12/2016   stage 3  . Depression   . Diabetes mellitus    Type II Diabetes  . Hypertension   . Hypothyroidism   . ICD (implantable cardiac defibrillator), singleMedtronic   . Personal history of chemotherapy 1996   right breast ca  . Personal history of radiation therapy 1996   right breast ca  . Sleep apnea    On CPAP  . Spleen enlarged   . Systolic heart failure, chronic ([place])     Past Surgical History:  Procedure Laterality Date  . ABDOMINAL HYSTERECTOMY    . BREAST BIOPSY Right 11/26/2015   - FAT NECROSIS, FIBROSIS, AND CHRONIC INFLAMMATION.   Marland Kitchen BREAST BIOPSY Right 12/20/2018   Affirm Bx- X-clip- path pending  .  BREAST EXCISIONAL BIOPSY Right 03/1995   + breast ca chemo and rad. 9:00 region  . BREAST EXCISIONAL BIOPSY Right 2011   surgical bx neg  11:00 region  . BREAST LUMPECTOMY Right 1996  . CHOLECYSTECTOMY    . COLONOSCOPY  03/05/2013  . COLONOSCOPY W/ POLYPECTOMY  03/05/2013  . COLONOSCOPY WITH PROPOFOL N/A 10/03/2017   Procedure: COLONOSCOPY WITH PROPOFOL;  Surgeon: Manya Silvas, MD;  [facility] ENDOSCOPY;  Service: Endoscopy;  Laterality: N/A;  . ESOPHAGOGASTRODUODENOSCOPY  01/11/2003, 06/25/2015  . ESOPHAGOGASTRODUODENOSCOPY (EGD) WITH PROPOFOL N/A 06/25/2015   Procedure: ESOPHAGOGASTRODUODENOSCOPY (EGD) WITH PROPOFOL;  Surgeon: Manya Silvas, MD;  Location: [facility] ENDOSCOPY;  Service: Endoscopy;  Laterality: N/A;  . FOOT SURGERY Right   . IMPLANTABLE CARDIOVERTER DEFIBRILLATOR (ICD) GENERATOR CHANGE N/A 11/15/2012   Procedure: ICD GENERATOR CHANGE;  Surgeon: Deboraha Sprang, MD;  Location[facility] CATH LAB;  Service: Cardiovascular;  Laterality: N/A;  . JOINT REPLACEMENT  2009   Total Knee Replacement, Left  . LEAD REVISION N/A 11/15/2012   Procedure: LEAD REVISION;  Surgeon: Deboraha Sprang, MD;  Location: [facility] C[facility] LAB;  Service: Cardiovascular;  Laterality: N/A;  . PACEMAKER INSERTION  06/30/2005   Medtronic Virtuoso VR ICD - Nonischemic  cardiomyopathy potentially related to adriamycin, class 3 congestive failture with a narrow QRS and a negative TDI  . TONSILLECTOMY    . TUBAL LIGATION      Current Outpatient Medications  Medication Sig Dispense Refill  . aspirin 81 MG tablet Take 81 mg by mouth daily.    . Calcium Carbonate-Vitamin D (CALTRATE 600+D) 600-400 MG-UNIT per tablet Take 1 tablet by mouth 2 (two) times daily.      . carvedilol (COREG) 12.5 MG tablet TAKE 1 & 1/2 TABLET BY MOUTH TWICE DAILY WITH FOOD 270 tablet 2  . docusate sodium (COLACE) 100 MG capsule Take 100 mg by mouth 2 (two) times daily.      . furosemide (LASIX) 20 MG tablet Take 1 tablet by mouth daily.    Marland Kitchen  gabapentin (NEURONTIN) 600 MG tablet Take 600 mg by mouth 2 (two) times daily.    Marland Kitchen glimepiride (AMARYL) 2 MG tablet Take 2 mg by mouth 2 (two) times daily before lunch and supper.     . levothyroxine (SYNTHROID, LEVOTHROID) 100 MCG tablet Take 100 mcg by mouth.     Marland Kitchen lisinopril (PRINIVIL,ZESTRIL) 20 MG tablet Take 20 mg by mouth daily.      . Multiple Vitamins-Minerals (CENTRUM SILVER) tablet Take 1 tablet by mouth daily.      Marland Kitchen omeprazole (PRILOSEC) 20 MG capsule Take 20 mg by mouth daily.      Marland Kitchen spironolactone (ALDACTONE) 25 MG tablet Take 0.5 tablets (12.5 mg total) by mouth daily. 30 tablet 6  . SYMBICORT 160-4.5 MCG/ACT inhaler Inhale 2 puffs into the lungs as needed.     . citalopram (CELEXA) 20 MG tablet Take 1 tablet by mouth daily. WILL BEGIN TO TAKE AFTER TITRATE DOWN OFF OF ZOLOFT     No current facility-administered medications for this visit.     No Known Allergies  Review of Systems negative except from HPI and PMH  Physical Exam BP 110/74 (BP Location: Left Arm, Patient Position: Sitting, Cuff Size: Normal)   Pulse 71   Temp (!) 97.3 F (36.3 C)   Ht 4' 11"  (1.499 m)   Wt 217 lb 8 oz (98.7 kg)   SpO2 97%   BMI 43.93 kg/m    BP 110/74 (BP Location: Left Arm, Patient Position: Sitting, Cuff Size: Normal)   Pulse 71   Temp (!) 97.3 F (36.3 C)   Ht 4' 11"  (1.499 m)   Wt 217 lb 8 oz (98.7 kg)   SpO2 97%   BMI 43.93 kg/m  Well developed and Morbidly obese in no acute distress HENT normal Neck supple with JVP-flat Clear Regular rate and rhythm, no murmur Abd-soft with active BS No Clubbing cyanosis tr edema Skin-warm and dry A & Oriented  Grossly normal sensory and motor function  ECG sinus @ 71 16/10/42  PRWP   Assessment and  Plan  Nonischemic cardiomyopathy  Heart failure Chronic systolic  Hypertension      PVCs   Implantable defibrillator-Medtronic The patient's device was interrogated.  The information was reviewed. No changes were made in  the programming.       Sleep apnea  -- compliant  Chronic kidney disease    Euvolemic continue current meds  BP well controlled

## 2019-02-20 NOTE — Patient Instructions (Addendum)
Medication Instructions:  - Your physician recommends that you continue on your current medications as directed. Please refer to the Current Medication list given to you today.  *If you need a refill on your cardiac medications before your next appointment, please call your pharmacy*  Lab Work: - none ordered  If you have labs (blood work) drawn today and your tests are completely normal, you will receive your results only by: Marland Kitchen MyChart Message (if you have MyChart) OR . A paper copy in the mail If you have any lab test that is abnormal or we need to change your treatment, we will call you to review the results.  Testing/Procedures: - none ordered  Follow-Up: At [facility], you and your health needs are our priority.  As part of our continuing mission to provide you with exceptional heart care, we have created designated Provider Care Teams.  These Care Teams include your primary Cardiologist (physician) and Advanced Practice Providers (APPs -  Physician Assistants and Nurse Practitioners) who all work together to provide you with the care you need, when you need it.  Your next appointment:   6 months  The format for your next appointment:   In Person  Provider:   [name], MD  Other Instructions - n/a

## 2019-02-23 DIAGNOSIS — I428 Other cardiomyopathies: Secondary | ICD-10-CM | POA: Diagnosis not present

## 2019-02-23 DIAGNOSIS — Z0000 Encounter for general adult medical examination without abnormal findings: Secondary | ICD-10-CM | POA: Diagnosis not present

## 2019-02-23 DIAGNOSIS — E1143 Type 2 diabetes mellitus with diabetic autonomic (poly)neuropathy: Secondary | ICD-10-CM | POA: Diagnosis not present

## 2019-02-23 DIAGNOSIS — K7460 Unspecified cirrhosis of liver: Secondary | ICD-10-CM | POA: Diagnosis not present

## 2019-02-23 DIAGNOSIS — K7581 Nonalcoholic steatohepatitis (NASH): Secondary | ICD-10-CM | POA: Diagnosis not present

## 2019-02-23 DIAGNOSIS — E039 Hypothyroidism, unspecified: Secondary | ICD-10-CM | POA: Diagnosis not present

## 2019-02-26 LAB — CUP PACEART INCLINIC DEVICE CHECK
Battery Remaining Longevity: 56 mo
Battery Voltage: 2.99 V
Brady Statistic RV Percent Paced: 0.09 %
Date Time Interrogation Session: 20201117133600
HighPow Impedance: 92 Ohm
Implantable Lead Implant Date: 20140813
Implantable Lead Location: 753860
Implantable Lead Model: 6935
Implantable Pulse Generator Implant Date: 20140813
Lead Channel Impedance Value: 380 Ohm
Lead Channel Impedance Value: 437 Ohm
Lead Channel Pacing Threshold Amplitude: 0.75 V
Lead Channel Pacing Threshold Pulse Width: 0.4 ms
Lead Channel Sensing Intrinsic Amplitude: 15 mV
Lead Channel Setting Pacing Amplitude: 2 V
Lead Channel Setting Pacing Pulse Width: 0.4 ms
Lead Channel Setting Sensing Sensitivity: 0.3 mV

## 2019-03-14 NOTE — Progress Notes (Signed)
Remote ICD transmission.   

## 2019-03-26 ENCOUNTER — Ambulatory Visit (INDEPENDENT_AMBULATORY_CARE_PROVIDER_SITE_OTHER): Payer: Medicare HMO

## 2019-03-26 DIAGNOSIS — I5022 Chronic systolic (congestive) heart failure: Secondary | ICD-10-CM | POA: Diagnosis not present

## 2019-03-26 DIAGNOSIS — Z95810 Presence of automatic (implantable) cardiac defibrillator: Secondary | ICD-10-CM | POA: Diagnosis not present

## 2019-03-27 NOTE — Progress Notes (Signed)
EPIC Encounter for ICM Monitoring  Patient Name: Katherine Rosales is a 68 y.o. female Date: 03/27/2019 Primary Care Physican: Dion Body, MD Primary [place] Electrophysiologist: [name] 02/20/2019 Weight: 217lbs  Spoke with patientandshe said she is feeling fine.  She ate a lot of country ham, sausage and bacon over the weekend causing decreased impedance.    Optivol thoracic impedancenormal.  Prescribed:Furosemide 20 mg 1 tablet dailyand PRN Furosemide if needed.   Labs: 01/09/2019 Creatinine 1.21, BUN 25, Potassium 4.1, Sodium 137, GFR 46-53 09/08/2018 Creatinine 1.30 12/27/2017 Creatinine 1.35, BUN 26, Potassium 4.7, Sodium 138, GFR 40-47  Recommendations:   She said she would take an extra Furosemide tomorrow.   Follow-up plan: ICM clinic phone appointment on 04/30/2019.   91 day device clinic remote transmission 05/14/2019.   Copy of ICM check sent to Dr. [name].   3 month ICM trend: 03/26/2019    1 Year ICM trend:       [name], RN 03/27/2019 3:32 PM

## 2019-04-09 DIAGNOSIS — K7581 Nonalcoholic steatohepatitis (NASH): Secondary | ICD-10-CM | POA: Diagnosis not present

## 2019-04-09 DIAGNOSIS — K7460 Unspecified cirrhosis of liver: Secondary | ICD-10-CM | POA: Diagnosis not present

## 2019-04-09 DIAGNOSIS — D693 Immune thrombocytopenic purpura: Secondary | ICD-10-CM | POA: Diagnosis not present

## 2019-04-26 DIAGNOSIS — E119 Type 2 diabetes mellitus without complications: Secondary | ICD-10-CM | POA: Diagnosis not present

## 2019-04-27 DIAGNOSIS — H524 Presbyopia: Secondary | ICD-10-CM | POA: Diagnosis not present

## 2019-04-30 ENCOUNTER — Ambulatory Visit (INDEPENDENT_AMBULATORY_CARE_PROVIDER_SITE_OTHER): Payer: Medicare HMO

## 2019-04-30 DIAGNOSIS — I5022 Chronic systolic (congestive) heart failure: Secondary | ICD-10-CM | POA: Diagnosis not present

## 2019-04-30 DIAGNOSIS — Z95810 Presence of automatic (implantable) cardiac defibrillator: Secondary | ICD-10-CM | POA: Diagnosis not present

## 2019-05-04 NOTE — Progress Notes (Signed)
EPIC Encounter for ICM Monitoring  Patient Name: Katherine Rosales is a 69 y.o. female Date: 05/04/2019 Primary Care Physican: Dion Body, MD Primary [place] Electrophysiologist: [name] 05/04/2019 Weight: 217lbs  Spoke with patientandshe said she is feeling fine.    Optivol thoracic impedancenormal.  Prescribed:Furosemide 20 mg 1 tablet dailyand PRN Furosemide if needed.   Labs: 01/09/2019 Creatinine 1.21, BUN 25, Potassium 4.1, Sodium 137, GFR 46-53 09/08/2018 Creatinine 1.30 12/27/2017 Creatinine 1.35, BUN 26, Potassium 4.7, Sodium 138, GFR 40-47  Recommendations:  No changes and encouraged to call if experiencing any fluid symptoms.  Follow-up plan: ICM clinic phone appointment on3/04/2019. 91 day device clinic remote transmission 05/14/2019.   Copy of ICM check sent to Katherine Rosales.   3 month ICM trend: 04/30/2019    1 Year ICM trend:       Katherine Billings, RN 05/04/2019 3:26 PM

## 2019-05-08 DIAGNOSIS — H25813 Combined forms of age-related cataract, bilateral: Secondary | ICD-10-CM | POA: Diagnosis not present

## 2019-05-11 ENCOUNTER — Other Ambulatory Visit: Payer: Self-pay | Admitting: Surgery

## 2019-05-11 DIAGNOSIS — R928 Other abnormal and inconclusive findings on diagnostic imaging of breast: Secondary | ICD-10-CM

## 2019-05-14 ENCOUNTER — Ambulatory Visit (INDEPENDENT_AMBULATORY_CARE_PROVIDER_SITE_OTHER): Payer: Medicare HMO | Admitting: *Deleted

## 2019-05-14 DIAGNOSIS — I5022 Chronic systolic (congestive) heart failure: Secondary | ICD-10-CM | POA: Diagnosis not present

## 2019-05-15 LAB — CUP PACEART REMOTE DEVICE CHECK
Battery Remaining Longevity: 63 mo
Battery Voltage: 2.98 V
Brady Statistic RV Percent Paced: 0.05 %
Date Time Interrogation Session: 20210208043824
HighPow Impedance: 77 Ohm
Implantable Lead Implant Date: 20140813
Implantable Lead Location: 753860
Implantable Lead Model: 6935
Implantable Pulse Generator Implant Date: 20140813
Lead Channel Impedance Value: 342 Ohm
Lead Channel Impedance Value: 399 Ohm
Lead Channel Pacing Threshold Amplitude: 0.75 V
Lead Channel Pacing Threshold Pulse Width: 0.4 ms
Lead Channel Sensing Intrinsic Amplitude: 12 mV
Lead Channel Sensing Intrinsic Amplitude: 12 mV
Lead Channel Setting Pacing Amplitude: 2 V
Lead Channel Setting Pacing Pulse Width: 0.4 ms
Lead Channel Setting Sensing Sensitivity: 0.3 mV

## 2019-05-18 DIAGNOSIS — D693 Immune thrombocytopenic purpura: Secondary | ICD-10-CM | POA: Diagnosis not present

## 2019-05-18 DIAGNOSIS — E1143 Type 2 diabetes mellitus with diabetic autonomic (poly)neuropathy: Secondary | ICD-10-CM | POA: Diagnosis not present

## 2019-05-18 DIAGNOSIS — K7581 Nonalcoholic steatohepatitis (NASH): Secondary | ICD-10-CM | POA: Diagnosis not present

## 2019-05-18 DIAGNOSIS — E039 Hypothyroidism, unspecified: Secondary | ICD-10-CM | POA: Diagnosis not present

## 2019-05-18 DIAGNOSIS — K7460 Unspecified cirrhosis of liver: Secondary | ICD-10-CM | POA: Diagnosis not present

## 2019-05-25 ENCOUNTER — Other Ambulatory Visit: Payer: Self-pay | Admitting: Internal Medicine

## 2019-05-25 DIAGNOSIS — Z6841 Body Mass Index (BMI) 40.0 and over, adult: Secondary | ICD-10-CM | POA: Diagnosis not present

## 2019-05-25 DIAGNOSIS — E119 Type 2 diabetes mellitus without complications: Secondary | ICD-10-CM | POA: Diagnosis not present

## 2019-05-25 DIAGNOSIS — Z0000 Encounter for general adult medical examination without abnormal findings: Secondary | ICD-10-CM | POA: Diagnosis not present

## 2019-06-04 ENCOUNTER — Ambulatory Visit (INDEPENDENT_AMBULATORY_CARE_PROVIDER_SITE_OTHER): Payer: Medicare HMO

## 2019-06-04 DIAGNOSIS — I5022 Chronic systolic (congestive) heart failure: Secondary | ICD-10-CM | POA: Diagnosis not present

## 2019-06-04 DIAGNOSIS — Z95810 Presence of automatic (implantable) cardiac defibrillator: Secondary | ICD-10-CM | POA: Diagnosis not present

## 2019-06-08 NOTE — Progress Notes (Signed)
EPIC Encounter for ICM Monitoring  Patient Name: Katherine Rosales is a 69 y.o. female Date: 06/08/2019 Primary Care Physican: Dion Body, MD Primary [place] Electrophysiologist: [name] 06/08/2019 Weight: 217lbs  Spoke with patientandshe said she is feeling fine.  Optivol thoracic impedancenormal.  Prescribed:Furosemide 20 mg 1 tablet dailyand PRN Furosemide if needed.   Labs: 01/09/2019 Creatinine 1.21, BUN 25, Potassium 4.1, Sodium 137, GFR 46-53 09/08/2018 Creatinine 1.30 12/27/2017 Creatinine 1.35, BUN 26, Potassium 4.7, Sodium 138, GFR 40-47  Recommendations:No changes and encouraged to call if experiencing any fluid symptoms.  Follow-up plan: ICM clinic phone appointment on4/08/2019. 91 day device clinic remote transmission 08/13/2019.  Office appointment with Dr [name] on 08/28/2019.  Copy of ICM check sent to [place].  3 month ICM trend: 06/04/2019    1 Year ICM trend:       Katherine Billings, RN 06/08/2019 3:11 PM

## 2019-06-14 DIAGNOSIS — E039 Hypothyroidism, unspecified: Secondary | ICD-10-CM | POA: Diagnosis not present

## 2019-06-14 DIAGNOSIS — E1165 Type 2 diabetes mellitus with hyperglycemia: Secondary | ICD-10-CM | POA: Diagnosis not present

## 2019-06-14 DIAGNOSIS — N1832 Chronic kidney disease, stage 3b: Secondary | ICD-10-CM | POA: Diagnosis not present

## 2019-06-14 DIAGNOSIS — E1121 Type 2 diabetes mellitus with diabetic nephropathy: Secondary | ICD-10-CM | POA: Diagnosis not present

## 2019-06-14 DIAGNOSIS — E1142 Type 2 diabetes mellitus with diabetic polyneuropathy: Secondary | ICD-10-CM | POA: Diagnosis not present

## 2019-06-14 DIAGNOSIS — I10 Essential (primary) hypertension: Secondary | ICD-10-CM | POA: Diagnosis not present

## 2019-06-21 ENCOUNTER — Other Ambulatory Visit: Payer: Medicare HMO

## 2019-07-13 NOTE — Progress Notes (Signed)
No ICM remote transmission received for 07/09/2019 and next ICM transmission scheduled for 07/30/2019.

## 2019-07-31 ENCOUNTER — Telehealth: Payer: Self-pay

## 2019-07-31 NOTE — Telephone Encounter (Signed)
Left message for patient to remind of missed remote transmission.  

## 2019-08-08 NOTE — Progress Notes (Signed)
No ICM remote transmission received for 07/30/2019 and next ICM transmission scheduled for 08/14/2019.

## 2019-08-13 ENCOUNTER — Ambulatory Visit
Admission: RE | Admit: 2019-08-13 | Discharge: 2019-08-13 | Disposition: A | Discharge status: Home / Self Care | Payer: Medicare HMO | Source: Ambulatory Visit | Attending: Surgery | Admitting: Surgery

## 2019-08-13 DIAGNOSIS — K7581 Nonalcoholic steatohepatitis (NASH): Secondary | ICD-10-CM | POA: Diagnosis not present

## 2019-08-13 DIAGNOSIS — R928 Other abnormal and inconclusive findings on diagnostic imaging of breast: Secondary | ICD-10-CM | POA: Diagnosis not present

## 2019-08-13 DIAGNOSIS — E1165 Type 2 diabetes mellitus with hyperglycemia: Secondary | ICD-10-CM | POA: Diagnosis not present

## 2019-08-13 DIAGNOSIS — N1832 Chronic kidney disease, stage 3b: Secondary | ICD-10-CM | POA: Diagnosis not present

## 2019-08-13 DIAGNOSIS — E1143 Type 2 diabetes mellitus with diabetic autonomic (poly)neuropathy: Secondary | ICD-10-CM | POA: Diagnosis not present

## 2019-08-13 DIAGNOSIS — E1122 Type 2 diabetes mellitus with diabetic chronic kidney disease: Secondary | ICD-10-CM | POA: Diagnosis not present

## 2019-08-13 DIAGNOSIS — E1142 Type 2 diabetes mellitus with diabetic polyneuropathy: Secondary | ICD-10-CM | POA: Diagnosis not present

## 2019-08-13 DIAGNOSIS — R922 Inconclusive mammogram: Secondary | ICD-10-CM | POA: Diagnosis not present

## 2019-08-13 DIAGNOSIS — E039 Hypothyroidism, unspecified: Secondary | ICD-10-CM | POA: Diagnosis not present

## 2019-08-13 DIAGNOSIS — E1121 Type 2 diabetes mellitus with diabetic nephropathy: Secondary | ICD-10-CM | POA: Diagnosis not present

## 2019-08-13 DIAGNOSIS — R1011 Right upper quadrant pain: Secondary | ICD-10-CM | POA: Diagnosis not present

## 2019-08-13 DIAGNOSIS — Z853 Personal history of malignant neoplasm of breast: Secondary | ICD-10-CM | POA: Diagnosis not present

## 2019-08-13 DIAGNOSIS — I10 Essential (primary) hypertension: Secondary | ICD-10-CM | POA: Diagnosis not present

## 2019-08-13 DIAGNOSIS — N1831 Chronic kidney disease, stage 3a: Secondary | ICD-10-CM | POA: Diagnosis not present

## 2019-08-13 DIAGNOSIS — K7469 Other cirrhosis of liver: Secondary | ICD-10-CM | POA: Diagnosis not present

## 2019-08-14 ENCOUNTER — Telehealth: Payer: Self-pay

## 2019-08-14 NOTE — Progress Notes (Signed)
No ICM remote transmission received for 08/14/2019 and next ICM transmission scheduled for 08/22/2019.

## 2019-08-14 NOTE — Telephone Encounter (Signed)
Left message for patient to remind of missed remote transmission.  

## 2019-08-15 ENCOUNTER — Other Ambulatory Visit: Payer: Self-pay | Admitting: Physician Assistant

## 2019-08-15 DIAGNOSIS — K7581 Nonalcoholic steatohepatitis (NASH): Secondary | ICD-10-CM

## 2019-08-15 DIAGNOSIS — R1011 Right upper quadrant pain: Secondary | ICD-10-CM

## 2019-08-15 DIAGNOSIS — K7469 Other cirrhosis of liver: Secondary | ICD-10-CM

## 2019-08-22 ENCOUNTER — Ambulatory Visit: Payer: Self-pay | Admitting: Surgery

## 2019-08-22 ENCOUNTER — Ambulatory Visit (INDEPENDENT_AMBULATORY_CARE_PROVIDER_SITE_OTHER): Payer: Medicare HMO

## 2019-08-22 DIAGNOSIS — I5022 Chronic systolic (congestive) heart failure: Secondary | ICD-10-CM

## 2019-08-22 DIAGNOSIS — Z95810 Presence of automatic (implantable) cardiac defibrillator: Secondary | ICD-10-CM

## 2019-08-22 DIAGNOSIS — R928 Other abnormal and inconclusive findings on diagnostic imaging of breast: Secondary | ICD-10-CM | POA: Diagnosis not present

## 2019-08-22 NOTE — Progress Notes (Signed)
EPIC Encounter for ICM Monitoring  Patient Name: Katherine Rosales is a 69 y.o. female Date: 08/22/2019 Primary Care Physican: Dion Body, MD Primary [place] Electrophysiologist: [name] 5/19/2021Weight: 384YKZ  Spoke with patient.  She denies any fluid symptoms.  Advised to update DPR at 5/25 OV.  Optivol thoracic impedancesuggesting possible fluid accumulation starting 08/14/2019.  Prescribed:Furosemide 20 mg 1 tablet dailyand PRN Furosemide if needed.   Labs: 01/09/2019 Creatinine 1.21, BUN 25, Potassium 4.1, Sodium 137, GFR 46-53 09/08/2018 Creatinine 1.30 12/27/2017 Creatinine 1.35, BUN 26, Potassium 4.7, Sodium 138, GFR 40-47  Recommendations:Advised to take extra Furosemide tablet x 2 - 3 days.  Encouraged to call if she experiences any fluid symptoms.   Follow-up plan: ICM clinic phone appointment on 09/10/2019 to recheck fluid levels (will be rechecked at 5/25 OV). 91 day device clinic remote transmission 11/12/2019.  Office appointment with Dr [name] on 08/28/2019.  Copy of ICM check sent to [place].  3 month ICM trend: 08/21/2019    1 Year ICM trend:       [name], RN 08/22/2019 8:01 AM

## 2019-08-22 NOTE — H&P (Signed)
Subjective:   CC: Abnormal mammogram [R92.8] HPI:  Katherine Rosales is a 69 y.o. female who returns for evaluation of above. Area of concern now expanded since last exam.  No changes on physical exam or reported re: breast.  Currently workup for possible lump on right flank area.  Past Medical History:  has a past medical history of Anxiety, Cancer of right breast (CMS-HCC), Chronic back pain, Depression, Diabetes mellitus type 2, uncomplicated (CMS-HCC), History of nephrotic syndrome, History of sleep apnea, Hypertension, Insomnia, Liver cirrhosis secondary to NASH (CMS-HCC), Nonischemic cardiomyopathy (CMS-HCC), Obesity, and Thyroid disease.  Past Surgical History:  has a past surgical history that includes Post CA of right breast with lumpectomy and radiation; Cholecystectomy; Insertion of defibrillator pacemaker; Right foot surgery; Benign cysts removal from right breast, 08/2009; Hysterectomy; Colonoscopy (01/11/2003); Colonoscopy (03/05/2013); egd (01/11/2003); egd (06/25/2015); Colonoscopy (10/03/2017); Left total knee arthroplasty using computer-assisted navigation (10/24/2008); and Laparoscopic tubal ligation.  Family History: family history includes No Known Problems in her brother, mother, and sister; Prostate cancer in her father.  Social History:  reports that she has never smoked. She has never used smokeless tobacco. She reports that she does not drink alcohol and does not use drugs.  Current Medications: has a current medication list which includes the following prescription(s): accu-chek aviva plus test strp, aspirin, blood glucose meter, calcium carbonate-vitamin d3, carvedilol, century adults 50 plus, dok, duloxetine, furosemide, gabapentin, glimepiride, i-vite, lancets, levothyroxine, lisinopril, omeprazole, semaglutide, and spironolactone.  Allergies:  Allergies as of 08/22/2019 - Reviewed 08/22/2019  Allergen Reaction Noted  . Ibuprofen Liver Disorder 11/01/2018    ROS:   A 15 point review of systems was performed and was negative except as noted in HPI   Objective:     BP 118/69   Pulse 89   Ht 149.9 cm (4' 11" )   Wt 98.4 kg (217 lb)   BMI 43.83 kg/m   Constitutional :  alert, appears stated age, cooperative and no distress  Lymphatics/Throat:  no asymmetry, masses, or scars  Respiratory:  clear to auscultation bilaterally  Cardiovascular:  regular rate and rhythm  Gastrointestinal: soft, non-tender; bowel sounds normal; no masses,  no organomegaly.   Musculoskeletal: Steady gait and movement  Skin: Cool and moist.  Former lumpectomy sites notes on right breast, with area of concern deep to the incision closer to areola.  Psychiatric: Normal affect, non-agitated, not confused  Breast:  Chaperone present for exam.  breasts appear normal, no suspicious masses, no skin or nipple changes or axillary nodes.    LABS:  SURGICAL PATHOLOGY SURGICAL PATHOLOGY CASE: [id number] PATIENT: Katherine Rosales Surgical Pathology Report     Specimen Submitted: A. Breast, right  Clinical History: Distortion. R/O radial scar vs IMC; X-shaped clip placed following stereotactic biopsy of RIGHT upper outer quadrant    DIAGNOSIS: A. BREAST, RIGHT UPPER OUTER QUADRANT; STEREOTACTIC CORE BIOPSY: - BENIGN MAMMARY PARENCHYMA WITH DENSE STROMAL FIBROSIS. - NEGATIVE FOR ATYPICAL PROLIFERATIVE BREAST DISEASE.  GROSS DESCRIPTION: A. Labeled: Right breast upper outer quadrant stereo cores biopsy, distortion Received: in a formalin-filled CoreTainer transport system Accompanying specimen radiograph: No Time / date in fixative: Tissue procedure time 8:19 AM, tissue put in formalin time 8:22 AM 12/20/2018 Cold ischemic time: 3 minutes Total fixation time: 12 hours Core pieces: Multiple Measurement: Ranging from 0.5 to 1.2 cm in length and 0.2 cm in width Description / comments: Fibrofatty soft tissue cores Inked: Green Entirely submitted. Block  summary: 1-3-entirely submitted breast cores   Final Diagnosis performed  by Allena Napoleon, MD.  Electronically signed 12/21/2018 2:15:02PM The electronic signature indicates that the named Attending Pathologist has evaluated the specimen Technical component performed at [facility], [address] [place], [place] [address] Lab: [phone] Dir: Rush Farmer, MD, MMM  Professional component performed at [facility], [address], [place], [place], [place] [address] Lab: [phone] Dir: Dellia Nims. Rubinas, MD      RADS: CLINICAL DATA: Six-month interval follow-up after biopsy of  architectural distortion in the UPPER subareolar RIGHT breast with  pathology demonstrating stromal fibrosis.   Personal history of malignant RIGHT breast lumpectomy in 1996 with  adjuvant radiation therapy and chemotherapy, though the lumpectomy  was performed in the UPPER OUTER QUADRANT of the RIGHT breast, not  in the UPPER subareolar location.   EXAM:  DIGITAL DIAGNOSTIC UNILATERAL RIGHT MAMMOGRAM WITH CAD AND TOMO   COMPARISON: Previous exam(s).   ACR Breast Density Category c: The breast tissue is heterogeneously  dense, which may obscure small masses.   FINDINGS:  Tomosynthesis and synthesized full field CC and MLO views of the  RIGHT breast were obtained.   The architectural distortion in the UPPER subareolar location is  even more prominent on the current examination when compared to the  examination 6 months ago. There is no visible associated mass. The X  shaped tissue marker clip placed at the time of the biopsy 6 months  ago is within the POSTERIOR portion of the distortion.   Post surgical scar/architectural distortion at the lumpectomy site  in the [place] at MIDDLE to POSTERIOR depth, separated  from the architectural distortion in the UPPER subareolar location  by approximately 4 cm. No new findings elsewhere in the RIGHT  breast.   Mammographic  images were processed with CAD.   IMPRESSION:  The architectural distortion in the UPPER subareolar RIGHT breast,  biopsy proven stromal fibrosis, is more prominent on the current  examination. The X shaped tissue marker clip is within the POSTERIOR  portion of the distortion.   RECOMMENDATION:  1. In the absence of a sonographic correlate on the prior  ultrasound, I would consider excisional biopsy of thedistortion in  order to exclude a high risk complex sclerosing lesion or lobular  neoplasia/malignancy. (Typically, stromal fibrosis will have an  associated ultrasound correlate which was not present on the  diagnostic ultrasound 6 months ago).  2. If surgical excision is not performed, diagnostic BILATERAL  mammography is recommended in September, 2021 (which will be 1 year  since the initial workup).  3. If surgical excision is performed, the patient may return to  screening mammography in September, 2021 (in the absence of  malignancy).   I have discussed the findings and recommendations with the patient.  If applicable, a reminder letter will be sent to the patient  regarding the next appointment.   BI-RADS CATEGORY 4: Suspicious.    Electronically Signed  By: Evangeline Dakin M.D.  On: 08/13/2019 15:27   Assessment:   Abnormal mammogram [R92.8]  Plan:     1. Abnormal mammogram [R92.8]  Due to change since previous exam, will recommend full excisional biopsy to confirm no occult lesion now.    Discussed the risk of surgery including recurrence, chronic pain, post-op infxn, poor/delayed wound healing, poor cosmesis, seroma, hematoma formation, and possible re-operation to address said risks. The risks of general anesthetic, if used, includes MI, CVA, sudden death or even reaction to anesthetic medications also discussed.  Typical post-op recovery time and possbility of  activity restrictions were also discussed.  Alternatives include continued observation.   Benefits include possible symptom relief, pathologic evaluation, and/or curative excision.   The patient verbalized understanding and all questions were answered to the patient's satisfaction.  2. Patient has elected to proceed with surgical treatment. Procedure will be scheduled.  RF localization, no SLNB. Written consent was obtained. RIGHT side

## 2019-08-27 ENCOUNTER — Ambulatory Visit
Admission: RE | Admit: 2019-08-27 | Discharge: 2019-08-27 | Disposition: A | Discharge status: Home / Self Care | Payer: Medicare HMO | Source: Ambulatory Visit | Attending: Physician Assistant | Admitting: Physician Assistant

## 2019-08-27 ENCOUNTER — Other Ambulatory Visit: Payer: Self-pay

## 2019-08-27 DIAGNOSIS — K7469 Other cirrhosis of liver: Secondary | ICD-10-CM | POA: Diagnosis not present

## 2019-08-27 DIAGNOSIS — K7460 Unspecified cirrhosis of liver: Secondary | ICD-10-CM | POA: Diagnosis not present

## 2019-08-27 DIAGNOSIS — R1011 Right upper quadrant pain: Secondary | ICD-10-CM | POA: Insufficient documentation

## 2019-08-27 DIAGNOSIS — K449 Diaphragmatic hernia without obstruction or gangrene: Secondary | ICD-10-CM | POA: Diagnosis not present

## 2019-08-27 DIAGNOSIS — K7581 Nonalcoholic steatohepatitis (NASH): Secondary | ICD-10-CM | POA: Insufficient documentation

## 2019-08-27 MED ORDER — IOHEXOL 300 MG/ML  SOLN
75.0000 mL | Freq: Once | INTRAMUSCULAR | Status: AC | PRN
  Administered 2019-08-27: 75 mL via INTRAVENOUS

## 2019-08-28 ENCOUNTER — Other Ambulatory Visit: Payer: Self-pay | Admitting: Surgery

## 2019-08-28 ENCOUNTER — Encounter: Payer: Self-pay | Admitting: Internal Medicine

## 2019-08-28 ENCOUNTER — Telehealth: Payer: Self-pay

## 2019-08-28 ENCOUNTER — Ambulatory Visit: Payer: Medicare HMO | Admitting: Internal Medicine

## 2019-08-28 VITALS — BP 94/62 | HR 88 | Ht 59.0 in | Wt 215.5 lb

## 2019-08-28 DIAGNOSIS — I493 Ventricular premature depolarization: Secondary | ICD-10-CM

## 2019-08-28 DIAGNOSIS — Z95810 Presence of automatic (implantable) cardiac defibrillator: Secondary | ICD-10-CM

## 2019-08-28 DIAGNOSIS — I5022 Chronic systolic (congestive) heart failure: Secondary | ICD-10-CM

## 2019-08-28 DIAGNOSIS — I428 Other cardiomyopathies: Secondary | ICD-10-CM

## 2019-08-28 DIAGNOSIS — R928 Other abnormal and inconclusive findings on diagnostic imaging of breast: Secondary | ICD-10-CM

## 2019-08-28 NOTE — Patient Instructions (Signed)
Medication Instructions:  - Your physician recommends that you continue on your current medications as directed. Please refer to the Current Medication list given to you today.  *If you need a refill on your cardiac medications before your next appointment, please call your pharmacy*   Lab Work: - none ordered  If you have labs (blood work) drawn today and your tests are completely normal, you will receive your results only by: Marland Kitchen MyChart Message (if you have MyChart) OR . A paper copy in the mail If you have any lab test that is abnormal or we need to change your treatment, we will call you to review the results.   Testing/Procedures: - none ordered   Follow-Up: At [facility], you and your health needs are our priority.  As part of our continuing mission to provide you with exceptional heart care, we have created designated Provider Care Teams.  These Care Teams include your primary Cardiologist (physician) and Advanced Practice Providers (APPs -  Physician Assistants and Nurse Practitioners) who all work together to provide you with the care you need, when you need it.  We recommend signing up for the patient portal called "MyChart".  Sign up information is provided on this After Visit Summary.  MyChart is used to connect with patients for Virtual Visits (Telemedicine).  Patients are able to view lab/test results, encounter notes, upcoming appointments, etc.  Non-urgent messages can be sent to your provider as well.   To learn more about what you can do with MyChart, go to [email or web address].    Your next appointment:   1 year(s)  The format for your next appointment:   In Person  Provider:   [name], MD   Other Instructions n/a

## 2019-08-28 NOTE — Progress Notes (Signed)
Patient Care Team: Dion Body, MD as PCP - General (Family Medicine) Deboraha Sprang, MD as PCP - Cardiology (Cardiology)   HPI  Katherine Rosales is a 69 y.o. female Seen in followup for Medtronic  ICD implanted for nonischemic cardiac myopathy and primary prevention. She has chronic systolic failure. She underwent generator replacement in 8/14.     She has known history of chronic hepatitis. Followed locally and at [facility];   also followed by nephrology.  Was contacted by somebody from: About a new device; could not find any records  Shortness of breath is chronic and stable.  No chest pain.  No edema.  Chronic back pain which is limiting.  Without nocturnal dyspnea orthopnea.  She is actually sleeping on her sister's couch.  Moved over during Covid and decided that it was better to be living with some breathing by herself.   Date Cr K Hgb  8/19 1.6 4.5 11.7  3/20 1/2  4.4 11.9  10/20 1.2 4.1 11.6  5/21 1.3 4.6 11/.8 (2/21)     DATE TEST EF   6/14 Echo   20-25 %   9/20 Echo   30-35 %            Past Medical History:  Diagnosis Date  . 8242 Lead-Medtronic   . Anemia 11/12/2016  . Anxiety 12/25/2013  . Automatic implantable cardioverter-defibrillator in situ   . Breast cancer ([place]) 03/1995   right breast ca with lumpectomy and chemo and rad  . Cardiomyopathy, nonischemic ([place])   . Chronic kidney disease 11/12/2016   stage 3  . Depression   . Diabetes mellitus    Type II Diabetes  . Hypertension   . Hypothyroidism   . ICD (implantable cardiac defibrillator), singleMedtronic   . Personal history of chemotherapy 1996   right breast ca  . Personal history of radiation therapy 1996   right breast ca  . Sleep apnea    On CPAP  . Spleen enlarged   . Systolic heart failure, chronic ([place])     Past Surgical History:  Procedure Laterality Date  . ABDOMINAL HYSTERECTOMY    . BREAST BIOPSY Right 11/26/2015   - FAT NECROSIS, FIBROSIS, AND CHRONIC INFLAMMATION.    Marland Kitchen BREAST BIOPSY Right 12/20/2018   Affirm Bx- X-clip- path pending  . BREAST EXCISIONAL BIOPSY Right 03/1995   + breast ca chemo and rad. 9:00 region  . BREAST EXCISIONAL BIOPSY Right 2011   surgical bx neg  11:00 region  . BREAST LUMPECTOMY Right 1996  . CHOLECYSTECTOMY    . COLONOSCOPY  03/05/2013  . COLONOSCOPY W/ POLYPECTOMY  03/05/2013  . COLONOSCOPY WITH PROPOFOL N/A 10/03/2017   Procedure: COLONOSCOPY WITH PROPOFOL;  Surgeon: Manya Silvas, MD;  Location: [facility] ENDOSCOPY;  Service: Endoscopy;  Laterality: N/A;  . ESOPHAGOGASTRODUODENOSCOPY  01/11/2003, 06/25/2015  . ESOPHAGOGASTRODUODENOSCOPY (EGD) WITH PROPOFOL N/A 06/25/2015   Procedure: ESOPHAGOGASTRODUODENOSCOPY (EGD) WITH PROPOFOL;  Surgeon: Manya Silvas, MD;  Location: [facility] ENDOSCOPY;  Service: Endoscopy;  Laterality: N/A;  . FOOT SURGERY Right   . IMPLANTABLE CARDIOVERTER DEFIBRILLATOR (ICD) GENERATOR CHANGE N/A 11/15/2012   Procedure: ICD GENERATOR CHANGE;  Surgeon: Deboraha Sprang, MD;  Location: [facility] CATH LAB;  Service: Cardiovascular;  Laterality: N/A;  . JOINT REPLACEMENT  2009   Total Knee Replacement, Left  . LEAD REVISION N/A 11/15/2012   Procedure: LEAD REVISION;  Surgeon: Deboraha Sprang, MD;  Location: [facility] CATH LAB;  Service: Cardiovascular;  Laterality: N/A;  . PACEMAKER INSERTION  06/30/2005   Medtronic Virtuoso VR ICD - Nonischemic cardiomyopathy potentially related to adriamycin, class 3 congestive failture with a narrow QRS and a negative TDI  . TONSILLECTOMY    . TUBAL LIGATION      Current Outpatient Medications  Medication Sig Dispense Refill  . aspirin 81 MG tablet Take 81 mg by mouth daily.    . Calcium Carbonate-Vitamin D (CALTRATE 600+D) 600-400 MG-UNIT per tablet Take 1 tablet by mouth 2 (two) times daily.      . carvedilol (COREG) 12.5 MG tablet TAKE 1 & 1/2 TABLET BY MOUTH TWICE DAILY WITH FOOD 90 tablet 10  . citalopram (CELEXA) 20 MG tablet Take 1 tablet by mouth daily. WILL BEGIN TO TAKE AFTER  TITRATE DOWN OFF OF ZOLOFT    . docusate sodium (COLACE) 100 MG capsule Take 100 mg by mouth 2 (two) times daily.      . furosemide (LASIX) 20 MG tablet Take 1 tablet by mouth daily.    Marland Kitchen gabapentin (NEURONTIN) 600 MG tablet Take 600 mg by mouth 2 (two) times daily.    Marland Kitchen glimepiride (AMARYL) 2 MG tablet Take 2 mg by mouth 2 (two) times daily before lunch and supper.     . levothyroxine (SYNTHROID, LEVOTHROID) 100 MCG tablet Take 100 mcg by mouth.     Marland Kitchen lisinopril (PRINIVIL,ZESTRIL) 20 MG tablet Take 20 mg by mouth daily.      . Multiple Vitamins-Minerals (CENTRUM SILVER) tablet Take 1 tablet by mouth daily.      Marland Kitchen omeprazole (PRILOSEC) 20 MG capsule Take 20 mg by mouth daily.      . Semaglutide,0.25 or 0.5MG/DOS, (OZEMPIC, 0.25 OR 0.5 MG/DOSE,) 2 MG/1.5ML SOPN Inject 0.5 mg into the skin. Takes it once a weeks every Friday    . spironolactone (ALDACTONE) 25 MG tablet TAKE (1/2) TABLET BY MOUTH ONCE DAILY. 15 tablet 10  . SYMBICORT 160-4.5 MCG/ACT inhaler Inhale 2 puffs into the lungs as needed.      No current facility-administered medications for this visit.    No Known Allergies  Review of Systems negative except from HPI and PMH  Physical Exam BP 94/62 (BP Location: Left Arm, Patient Position: Sitting, Cuff Size: Large)   Pulse 88   Ht 4' 11"  (1.499 m)   Wt 215 lb 8 oz (97.8 kg)   SpO2 96%   BMI 43.53 kg/m   Well developed and well nourished in no acute distress HENT normal Neck supple  Clear Device pocket well healed; without hematoma or erythema.  There is no tethering  Regular rate and rhythm, no  gallop No murmur Abd-soft with active BS-- fullness R side  No Clubbing cyanosis  edema Skin-warm and dry A & Oriented  Grossly normal sensory and motor function  ECG sinus at 88 Interval 21/11/39 Nonspecific ST changes   Assessment and  Plan  Nonischemic cardiomyopathy  Heart failure Chronic systolic  Hypertension      PVCs   Implantable defibrillator-Medtronic         Sleep apnea  -- compliant       Euvolemic continue current meds  BP low limiting afterload reduction  Probable right sided lipoma; reviewed her CT scan with her.  Suggested she cut contact her PCP to make sure nothing else.  PVCs are quiescient. Will decrease lisinopril to 10 mg a day-- but defer to renal

## 2019-08-28 NOTE — Telephone Encounter (Signed)
   [facility] Pre-operative Risk Assessment    HEARTCARE STAFF: - Please ensure there is not already an duplicate clearance open for this procedure. - Under Visit Info/Reason for Call, type in Other and utilize the format Clearance MM/DD/YY or Clearance TBD. Do not use dashes or single digits. - If request is for dental extraction, please clarify the # of teeth to be extracted.  Request for surgical clearance:  1. What type of surgery is being performed? EXCISIONAL RIGHT BREAST BIOPSY WITH RF TAG   2. When is this surgery scheduled? 09/24/19   3. What type of clearance is required (medical clearance vs. Pharmacy clearance to hold med vs. Both)? MEDICAL  4. Are there any medications that need to be held prior to surgery and how long? ASA   5. Practice name and name of physician performing surgery? [facility]; DR. Benjamine Sprague   6. What is the office phone number? [phone]   7.   What is the office fax number? [phone]  8.   Anesthesia type (None, local, MAC, general) ? GENERAL    Jacinta Shoe 08/28/2019, 3:15 PM  _________________________________________________________________   (provider comments below)

## 2019-09-04 NOTE — Telephone Encounter (Signed)
Ok to hold ASA

## 2019-09-05 DIAGNOSIS — N1832 Chronic kidney disease, stage 3b: Secondary | ICD-10-CM | POA: Diagnosis not present

## 2019-09-05 DIAGNOSIS — E1122 Type 2 diabetes mellitus with diabetic chronic kidney disease: Secondary | ICD-10-CM | POA: Diagnosis not present

## 2019-09-05 DIAGNOSIS — R809 Proteinuria, unspecified: Secondary | ICD-10-CM | POA: Diagnosis not present

## 2019-09-05 DIAGNOSIS — N041 Nephrotic syndrome with focal and segmental glomerular lesions: Secondary | ICD-10-CM | POA: Diagnosis not present

## 2019-09-05 DIAGNOSIS — N2581 Secondary hyperparathyroidism of renal origin: Secondary | ICD-10-CM | POA: Diagnosis not present

## 2019-09-05 DIAGNOSIS — I129 Hypertensive chronic kidney disease with stage 1 through stage 4 chronic kidney disease, or unspecified chronic kidney disease: Secondary | ICD-10-CM | POA: Diagnosis not present

## 2019-09-05 NOTE — Telephone Encounter (Signed)
   Primary Cardiologist: [name], MD  Chart reviewed as part of pre-operative protocol coverage. Patient was contacted 09/05/2019 in reference to pre-operative risk assessment for pending surgery as outlined below.  Katherine Rosales was last seen on 08/28/2019 by Dr. [name].  Since that day, Katherine Rosales has done well.  Therefore, based on ACC/AHA guidelines, the patient would be at acceptable risk for the planned procedure without further cardiovascular testing.   I will route this recommendation to the requesting party via Epic fax function and remove from pre-op pool. Please call with questions.  She may hold aspirin for 7 days prior to the procedure and restart as soon as possible afterward.   [place], [place] 09/05/2019, 9:28 AM

## 2019-09-10 ENCOUNTER — Ambulatory Visit (INDEPENDENT_AMBULATORY_CARE_PROVIDER_SITE_OTHER): Payer: Medicare HMO

## 2019-09-10 DIAGNOSIS — I5022 Chronic systolic (congestive) heart failure: Secondary | ICD-10-CM

## 2019-09-10 DIAGNOSIS — Z95810 Presence of automatic (implantable) cardiac defibrillator: Secondary | ICD-10-CM

## 2019-09-12 DIAGNOSIS — R109 Unspecified abdominal pain: Secondary | ICD-10-CM | POA: Diagnosis not present

## 2019-09-12 DIAGNOSIS — K449 Diaphragmatic hernia without obstruction or gangrene: Secondary | ICD-10-CM | POA: Diagnosis not present

## 2019-09-12 DIAGNOSIS — K7460 Unspecified cirrhosis of liver: Secondary | ICD-10-CM | POA: Diagnosis not present

## 2019-09-12 DIAGNOSIS — K5900 Constipation, unspecified: Secondary | ICD-10-CM | POA: Diagnosis not present

## 2019-09-12 DIAGNOSIS — E1143 Type 2 diabetes mellitus with diabetic autonomic (poly)neuropathy: Secondary | ICD-10-CM | POA: Diagnosis not present

## 2019-09-12 DIAGNOSIS — N1831 Chronic kidney disease, stage 3a: Secondary | ICD-10-CM | POA: Diagnosis not present

## 2019-09-12 DIAGNOSIS — D693 Immune thrombocytopenic purpura: Secondary | ICD-10-CM | POA: Diagnosis not present

## 2019-09-12 DIAGNOSIS — K219 Gastro-esophageal reflux disease without esophagitis: Secondary | ICD-10-CM | POA: Diagnosis not present

## 2019-09-12 DIAGNOSIS — K7581 Nonalcoholic steatohepatitis (NASH): Secondary | ICD-10-CM | POA: Diagnosis not present

## 2019-09-12 DIAGNOSIS — E039 Hypothyroidism, unspecified: Secondary | ICD-10-CM | POA: Diagnosis not present

## 2019-09-12 NOTE — Progress Notes (Signed)
EPIC Encounter for ICM Monitoring  Patient Name: Katherine Rosales is a 69 y.o. female Date: 09/12/2019 Primary Care Physican: Dion Body, MD Primary [place] Electrophysiologist: Klein 6/9/2021Office Weight: 213lbs  Spoke with patient and fingers are swollen at times.BP has been running low 80/60's. She said she drinks a lot of fluids everyday and advised to limit fluid intake 64 oz daily.    Optivol thoracic impedancetrending slightly below baseline.  Prescribed:Furosemide 20 mg 1 tablet dailyand PRN Furosemide if needed.   Labs: 01/09/2019 Creatinine 1.21, BUN 25, Potassium 4.1, Sodium 137, GFR 46-53 09/08/2018 Creatinine 1.30 12/27/2017 Creatinine 1.35, BUN 26, Potassium 4.7, Sodium 138, GFR 40-47  Recommendations:Recommendation to limit salt intake to 2000 mg daily and fluid intake to 64 oz daily.  Encouraged to call if experiencing any fluid symptoms.   Follow-up plan: ICM clinic phone appointment on6/28/2021. 91 day device clinic remote transmission 11/12/2019.   Copy of ICM check sent to [place].  3 month ICM trend: 09/10/2019    1 Year ICM trend:       [name], RN 09/12/2019 12:12 PM

## 2019-09-14 ENCOUNTER — Encounter
Admission: RE | Admit: 2019-09-14 | Discharge: 2019-09-14 | Disposition: A | Discharge status: Home / Self Care | Payer: Medicare HMO | Source: Ambulatory Visit | Attending: Surgery | Admitting: Surgery

## 2019-09-14 ENCOUNTER — Other Ambulatory Visit: Payer: Self-pay

## 2019-09-14 DIAGNOSIS — Z01818 Encounter for other preprocedural examination: Secondary | ICD-10-CM | POA: Insufficient documentation

## 2019-09-14 HISTORY — DX: Nonalcoholic steatohepatitis (NASH): K75.81

## 2019-09-14 NOTE — Patient Instructions (Signed)
Your procedure is scheduled on: 09/24/19 Report to [facility]. To find out your arrival time please call [phone] between 1PM - 3PM on 09/21/19 MAMMOGRAPHY [phone].  Remember: Instructions that are not followed completely may result in serious medical risk, up to and including death, or upon the discretion of your surgeon and anesthesiologist your surgery may need to be rescheduled.     _X__ 1. Do not eat food after midnight the night before your procedure.                 No gum chewing or hard candies. You may drink clear liquids up to 2 hours                 before you are scheduled to arrive for your surgery- DO not drink clear                 liquids within 2 hours of the start of your surgery.                 Clear Liquids include:  water, apple juice without pulp, clear carbohydrate                 drink such as Clearfast or Gatorade, Black Coffee or Tea (Do not add                 anything to coffee or tea). Diabetics water only  __X__2.  On the morning of surgery brush your teeth with toothpaste and water, you                 may rinse your mouth with mouthwash if you wish.  Do not swallow any              toothpaste of mouthwash.     _X__ 3.  No Alcohol for 24 hours before or after surgery.   _X__ 4.  Do Not Smoke or use e-cigarettes For 24 Hours Prior to Your Surgery.                 Do not use any chewable tobacco products for at least 6 hours prior to                 surgery.  ____  5.  Bring all medications with you on the day of surgery if instructed.   __X__  6.  Notify your doctor if there is any change in your medical condition      (cold, fever, infections).     Do not wear jewelry, make-up, hairpins, clips or nail polish. Do not wear lotions, powders, or perfumes. NO DEODARANT Do not shave 48 hours prior to surgery. Men may shave face and neck. Do not bring valuables to the hospital.    [facility] [place] is  not responsible for any belongings or valuables.  Contacts, dentures/partials or body piercings may not be worn into surgery. Bring a case for your contacts, glasses or hearing aids, a denture cup will be supplied. Leave your suitcase in the car. After surgery it may be brought to your room. For patients admitted to the hospital, discharge time is determined by your treatment team.   Patients discharged the day of surgery will not be allowed to drive home.   Please read over the following fact sheets that you were given:   MRSA Information  __X__ Take these medicines the morning of surgery with A SIP OF  WATER:    1. carvedilol (COREG) 12.5 MG tablet  2. citalopram (CELEXA) 20 MG tablet  3. DULoxetine (CYMBALTA) 30 MG capsule  4. gabapentin (NEURONTIN) 600 MG tablet  5. levothyroxine (SYNTHROID, LEVOTHROID) 100 MCG tablet  6. omeprazole (PRILOSEC) 20 MG capsule  ____ Fleet Enema (as directed)   __X__ Use CHG Soap/SAGE wipes as directed  ____ Use inhalers on the day of surgery  ____ Stop metformin/Janumet/Farxiga 2 days prior to surgery    ____ Take 1/2 of usual insulin dose the night before surgery. No insulin the morning          of surgery.   ____ Stop Blood Thinners Coumadin/Plavix/Xarelto/Pleta/Pradaxa/Eliquis/Effient/Aspirin  on   Or contact your Surgeon, Cardiologist or Medical Doctor regarding  ability to stop your blood thinners  __X__ Stop Anti-inflammatories 7 days before surgery such as Advil, Ibuprofen, Motrin,  BC or Goodies Powder, Naprosyn, Naproxen, Aleve    __X__ Stop all herbal supplements, fish oil or vitamin E until after surgery.    __X__ Bring C-Pap to the hospital.   HOLD ASPIRIN FOR 7 DAYS BEFORE YOUR SURGERY

## 2019-09-17 ENCOUNTER — Ambulatory Visit: Payer: Medicare HMO

## 2019-09-18 ENCOUNTER — Other Ambulatory Visit: Payer: Self-pay

## 2019-09-18 MED ORDER — FUROSEMIDE 20 MG PO TABS: 20.0000 mg | ORAL_TABLET | Freq: Every day | ORAL | 2 refills | Status: DC

## 2019-09-18 NOTE — Telephone Encounter (Signed)
Follow up   Vicky from [facility] called and she said they did not receive clearance for the pt. She gave correct fax # [phone] attn Olegario Shearer

## 2019-09-19 ENCOUNTER — Other Ambulatory Visit: Payer: Self-pay

## 2019-09-19 ENCOUNTER — Ambulatory Visit
Admission: RE | Admit: 2019-09-19 | Discharge: 2019-09-19 | Disposition: A | Discharge status: Home / Self Care | Payer: Medicare HMO | Source: Ambulatory Visit | Attending: Surgery | Admitting: Surgery

## 2019-09-19 ENCOUNTER — Other Ambulatory Visit
Admission: RE | Admit: 2019-09-19 | Discharge: 2019-09-19 | Disposition: A | Discharge status: Home / Self Care | Payer: Medicare HMO | Source: Ambulatory Visit | Attending: Surgery | Admitting: Surgery

## 2019-09-19 DIAGNOSIS — R928 Other abnormal and inconclusive findings on diagnostic imaging of breast: Secondary | ICD-10-CM | POA: Diagnosis not present

## 2019-09-19 DIAGNOSIS — H8113 Benign paroxysmal vertigo, bilateral: Secondary | ICD-10-CM | POA: Diagnosis not present

## 2019-09-19 DIAGNOSIS — N183 Chronic kidney disease, stage 3 unspecified: Secondary | ICD-10-CM | POA: Diagnosis not present

## 2019-09-19 DIAGNOSIS — Z01818 Encounter for other preprocedural examination: Secondary | ICD-10-CM | POA: Diagnosis not present

## 2019-09-19 DIAGNOSIS — Z6841 Body Mass Index (BMI) 40.0 and over, adult: Secondary | ICD-10-CM | POA: Diagnosis not present

## 2019-09-19 DIAGNOSIS — E039 Hypothyroidism, unspecified: Secondary | ICD-10-CM | POA: Diagnosis not present

## 2019-09-19 DIAGNOSIS — U071 COVID-19: Secondary | ICD-10-CM | POA: Insufficient documentation

## 2019-09-19 DIAGNOSIS — F331 Major depressive disorder, recurrent, moderate: Secondary | ICD-10-CM | POA: Diagnosis not present

## 2019-09-19 DIAGNOSIS — N6031 Fibrosclerosis of right breast: Secondary | ICD-10-CM | POA: Diagnosis not present

## 2019-09-19 DIAGNOSIS — F411 Generalized anxiety disorder: Secondary | ICD-10-CM | POA: Diagnosis not present

## 2019-09-20 ENCOUNTER — Other Ambulatory Visit: Payer: Medicare HMO

## 2019-09-20 LAB — SARS CORONAVIRUS 2 (TAT 6-24 HRS): SARS Coronavirus 2: POSITIVE — AB

## 2019-09-21 ENCOUNTER — Telehealth: Payer: Self-pay | Admitting: Physician Assistant

## 2019-09-21 NOTE — Telephone Encounter (Signed)
Called to discuss with Katherine Rosales about Covid symptoms and the use of bamlanivimab/etesevimab or casirivimab/imdevimab, a monoclonal antibody infusion for those with mild to moderate Covid symptoms and at a high risk of hospitalization.     Pt does not qualify for infusion therapy as pt has asymptomatic infection. She was tested for an upcoming elective breast biopsy. She had the full Moderna vaccine series in Feb and March of this year.    Isolation precautions discussed. Advised to contact back for consideration should they develop symptoms. Patient verbalized understanding. She has our hotline number.    Patient Active Problem List   Diagnosis Date Noted  . 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 Grieving 11/20/2013  . Stress 10/27/2010  . CARDIOMYOPATHY, SECONDARY 09/10/2008  . SYSTOLIC HEART FAILURE, CHRONIC 09/10/2008  . ICD -Medtronic 09/10/2008    Angelena Form PA-C

## 2019-09-24 ENCOUNTER — Ambulatory Visit: Payer: Medicare HMO

## 2019-09-25 ENCOUNTER — Other Ambulatory Visit: Payer: Self-pay

## 2019-09-25 NOTE — Telephone Encounter (Signed)
*  STAT* If patient is at the pharmacy, call can be transferred to refill team.   1. Which medications need to be refilled? (please list name of each medication and dose if known) Lasix  2. Which pharmacy/location (including street and city if local pharmacy) is medication to be sent to? [facility]  3. Do they need a 30 day or 90 day supply? [place]

## 2019-09-25 NOTE — Telephone Encounter (Signed)
Medication was sent in 09/18/2019

## 2019-10-01 ENCOUNTER — Ambulatory Visit (INDEPENDENT_AMBULATORY_CARE_PROVIDER_SITE_OTHER): Payer: Medicare HMO

## 2019-10-01 DIAGNOSIS — Z95810 Presence of automatic (implantable) cardiac defibrillator: Secondary | ICD-10-CM | POA: Diagnosis not present

## 2019-10-01 DIAGNOSIS — I5022 Chronic systolic (congestive) heart failure: Secondary | ICD-10-CM

## 2019-10-03 ENCOUNTER — Telehealth: Payer: Self-pay

## 2019-10-03 NOTE — Telephone Encounter (Signed)
ICM call to patient.  She reported the Furosemide 20 mg 1 tablet daily was refilled but did not get the PRN Furosemide script filled.  She reports she needs to have the separate PRN script sent to [facility] in [place]. Advised will route to Dr Olin Pia nurse Alvis Lemmings, RN to discuss with Dr [name].

## 2019-10-03 NOTE — Progress Notes (Signed)
EPIC Encounter for ICM Monitoring  Patient Name: Katherine Rosales is a 69 y.o. female Date: 10/03/2019 Primary Care Physican: Dion Body, MD Primary [place] Electrophysiologist: Klein 6/9/2021Office Weight: 213lbs  Spoke with patient and reports feeling well at this time.  Denies fluid symptoms.  She tested positive for COVID on 09/24/2019.  Optivol thoracic impedancenormal  Prescribed:Furosemide 20 mg 1 tablet dailyand PRN Furosemide if needed.   Labs: 01/09/2019 Creatinine 1.21, BUN 25, Potassium 4.1, Sodium 137, GFR 46-53 09/08/2018 Creatinine 1.30 12/27/2017 Creatinine 1.35, BUN 26, Potassium 4.7, Sodium 138, GFR 40-47  Recommendations:No changes and encouraged to call if experiencing any fluid symptoms.  Follow-up plan: ICM clinic phone appointment on8/05/2019. 91 day device clinic remote transmission8/12/2019.  EP/Cardiology Office Visits: Recall for 08/27/2020 with Dr. [name].    Copy of ICM check sent to Dr. [name].   3 month ICM trend: 10/01/2019    1 Year ICM trend:       [name], RN 10/03/2019 10:57 AM

## 2019-10-04 NOTE — Telephone Encounter (Signed)
I have reviewed the patient's chart.   Most recent medication list state the patient is taking lasix 20 mg once daily.   Reviewed back to Dr. Olin Pia note from 05/17/17. There is a comment from Dr. [name] as below:  "Euvolemic continue current meds-- followed closely in ICM clinic   Will give Lasix 20 Rx so as to allow prn uptitration"   Will have nursing call the patient tomorrow to verify she is taking: - Furosemide 20 mg once daily with an extra 1 tablet daily as needed for swelling/ increased SOB.  If that is what she is doing, then ok to adjust her furosemide RX as above.   She should not need 2 separate RX's.

## 2019-10-05 MED ORDER — FUROSEMIDE 20 MG PO TABS: 20.0000 mg | ORAL_TABLET | Freq: Every day | ORAL | 2 refills | Status: DC

## 2019-10-05 NOTE — Telephone Encounter (Signed)
Spoke to patient and she is taking the furosemide 20 mg daily and extra 1 tablet as needed for swelling or increased shortness of breath. The issue is the pharmacy needs it to read that way so that insurance will pay for it. Rx resent to reflect the appropriate wording. Patient will call her pharmacy to get her extra pills.

## 2019-10-18 ENCOUNTER — Ambulatory Visit: Payer: Medicare HMO

## 2019-10-18 ENCOUNTER — Encounter: Payer: Self-pay | Admitting: Surgery

## 2019-10-18 ENCOUNTER — Other Ambulatory Visit: Payer: Self-pay

## 2019-10-18 ENCOUNTER — Ambulatory Visit
Admission: RE | Admit: 2019-10-18 | Discharge: 2019-10-18 | Disposition: A | Discharge status: Home / Self Care | Payer: Medicare HMO | Attending: Surgery | Admitting: Surgery

## 2019-10-18 ENCOUNTER — Encounter
Admission: RE | Disposition: A | Discharge status: Home / Self Care | Payer: Self-pay | Source: Home / Self Care | Attending: Surgery

## 2019-10-18 ENCOUNTER — Ambulatory Visit
Admission: RE | Admit: 2019-10-18 | Discharge: 2019-10-18 | Disposition: A | Discharge status: Home / Self Care | Payer: Medicare HMO | Source: Ambulatory Visit | Attending: Surgery | Admitting: Surgery

## 2019-10-18 DIAGNOSIS — N641 Fat necrosis of breast: Secondary | ICD-10-CM | POA: Insufficient documentation

## 2019-10-18 DIAGNOSIS — Z96652 Presence of left artificial knee joint: Secondary | ICD-10-CM | POA: Diagnosis not present

## 2019-10-18 DIAGNOSIS — Z6841 Body Mass Index (BMI) 40.0 and over, adult: Secondary | ICD-10-CM | POA: Insufficient documentation

## 2019-10-18 DIAGNOSIS — K7460 Unspecified cirrhosis of liver: Secondary | ICD-10-CM | POA: Insufficient documentation

## 2019-10-18 DIAGNOSIS — Z853 Personal history of malignant neoplasm of breast: Secondary | ICD-10-CM | POA: Diagnosis not present

## 2019-10-18 DIAGNOSIS — Z79899 Other long term (current) drug therapy: Secondary | ICD-10-CM | POA: Insufficient documentation

## 2019-10-18 DIAGNOSIS — E119 Type 2 diabetes mellitus without complications: Secondary | ICD-10-CM | POA: Insufficient documentation

## 2019-10-18 DIAGNOSIS — Z9221 Personal history of antineoplastic chemotherapy: Secondary | ICD-10-CM | POA: Diagnosis not present

## 2019-10-18 DIAGNOSIS — I509 Heart failure, unspecified: Secondary | ICD-10-CM | POA: Diagnosis not present

## 2019-10-18 DIAGNOSIS — E669 Obesity, unspecified: Secondary | ICD-10-CM | POA: Insufficient documentation

## 2019-10-18 DIAGNOSIS — Z7984 Long term (current) use of oral hypoglycemic drugs: Secondary | ICD-10-CM | POA: Diagnosis not present

## 2019-10-18 DIAGNOSIS — K7581 Nonalcoholic steatohepatitis (NASH): Secondary | ICD-10-CM | POA: Diagnosis not present

## 2019-10-18 DIAGNOSIS — E039 Hypothyroidism, unspecified: Secondary | ICD-10-CM | POA: Insufficient documentation

## 2019-10-18 DIAGNOSIS — I110 Hypertensive heart disease with heart failure: Secondary | ICD-10-CM | POA: Diagnosis not present

## 2019-10-18 DIAGNOSIS — Z7982 Long term (current) use of aspirin: Secondary | ICD-10-CM | POA: Insufficient documentation

## 2019-10-18 DIAGNOSIS — I428 Other cardiomyopathies: Secondary | ICD-10-CM | POA: Insufficient documentation

## 2019-10-18 DIAGNOSIS — G4730 Sleep apnea, unspecified: Secondary | ICD-10-CM | POA: Diagnosis not present

## 2019-10-18 DIAGNOSIS — N6031 Fibrosclerosis of right breast: Secondary | ICD-10-CM | POA: Diagnosis not present

## 2019-10-18 DIAGNOSIS — Z79890 Hormone replacement therapy (postmenopausal): Secondary | ICD-10-CM | POA: Diagnosis not present

## 2019-10-18 DIAGNOSIS — R928 Other abnormal and inconclusive findings on diagnostic imaging of breast: Secondary | ICD-10-CM

## 2019-10-18 DIAGNOSIS — Z923 Personal history of irradiation: Secondary | ICD-10-CM | POA: Insufficient documentation

## 2019-10-18 DIAGNOSIS — N63 Unspecified lump in unspecified breast: Secondary | ICD-10-CM

## 2019-10-18 DIAGNOSIS — Z95810 Presence of automatic (implantable) cardiac defibrillator: Secondary | ICD-10-CM | POA: Insufficient documentation

## 2019-10-18 DIAGNOSIS — D649 Anemia, unspecified: Secondary | ICD-10-CM | POA: Diagnosis not present

## 2019-10-18 DIAGNOSIS — N6489 Other specified disorders of breast: Secondary | ICD-10-CM | POA: Diagnosis not present

## 2019-10-18 HISTORY — PX: BREAST BIOPSY WITH RADIO FREQUENCY LOCALIZER: SHX6895

## 2019-10-18 LAB — GLUCOSE, CAPILLARY
Glucose-Capillary: 116 mg/dL — ABNORMAL HIGH (ref 70–99)
Glucose-Capillary: 104 mg/dL — ABNORMAL HIGH (ref 70–99)

## 2019-10-18 SURGERY — BREAST BIOPSY WITH RADIO FREQUENCY LOCALIZER
Anesthesia: General | Laterality: Right

## 2019-10-18 MED ORDER — ONDANSETRON HCL 4 MG/2ML IJ SOLN: 4.0000 mg | Freq: Once | INTRAMUSCULAR | Status: DC | PRN

## 2019-10-18 MED ORDER — FENTANYL CITRATE (PF) 100 MCG/2ML IJ SOLN
INTRAMUSCULAR | Status: DC | PRN
  Administered 2019-10-18: 25 ug via INTRAVENOUS

## 2019-10-18 MED ORDER — LIDOCAINE HCL (PF) 2 % IJ SOLN
INTRAMUSCULAR | Status: DC | PRN
  Administered 2019-10-18: 100 mg

## 2019-10-18 MED ORDER — ONDANSETRON HCL 4 MG/2ML IJ SOLN
INTRAMUSCULAR | Status: DC | PRN
  Administered 2019-10-18: 4 mg via INTRAVENOUS

## 2019-10-18 MED ORDER — LIDOCAINE HCL (PF) 1 % IJ SOLN
INTRAMUSCULAR | Status: AC
  Filled 2019-10-18: qty 30

## 2019-10-18 MED ORDER — SODIUM CHLORIDE 0.9 % IV SOLN
INTRAVENOUS | Status: DC | PRN
  Administered 2019-10-18: 80 ug/min via INTRAVENOUS

## 2019-10-18 MED ORDER — ACETAMINOPHEN 325 MG PO TABS: 650.0000 mg | ORAL_TABLET | Freq: Three times a day (TID) | ORAL | 0 refills | Status: AC | PRN

## 2019-10-18 MED ORDER — EPHEDRINE 5 MG/ML INJ
INTRAVENOUS | Status: AC
  Filled 2019-10-18: qty 10

## 2019-10-18 MED ORDER — CEFAZOLIN SODIUM-DEXTROSE 2-4 GM/100ML-% IV SOLN
2.0000 g | INTRAVENOUS | Status: AC
  Administered 2019-10-18: 2 g via INTRAVENOUS

## 2019-10-18 MED ORDER — CHLORHEXIDINE GLUCONATE CLOTH 2 % EX PADS: 6.0000 | MEDICATED_PAD | Freq: Once | CUTANEOUS | Status: DC

## 2019-10-18 MED ORDER — EPHEDRINE SULFATE 50 MG/ML IJ SOLN
INTRAMUSCULAR | Status: DC | PRN
  Administered 2019-10-18: 5 mg via INTRAVENOUS
  Administered 2019-10-18 (×4): 10 mg via INTRAVENOUS

## 2019-10-18 MED ORDER — DOCUSATE SODIUM 100 MG PO CAPS: 100.0000 mg | ORAL_CAPSULE | Freq: Two times a day (BID) | ORAL | 0 refills | Status: AC | PRN

## 2019-10-18 MED ORDER — FENTANYL CITRATE (PF) 100 MCG/2ML IJ SOLN: 25.0000 ug | INTRAMUSCULAR | Status: DC | PRN

## 2019-10-18 MED ORDER — CHLORHEXIDINE GLUCONATE 0.12 % MT SOLN
15.0000 mL | Freq: Once | OROMUCOSAL | Status: AC
  Administered 2019-10-18: 15 mL via OROMUCOSAL

## 2019-10-18 MED ORDER — SEVOFLURANE IN SOLN
RESPIRATORY_TRACT | Status: AC
  Filled 2019-10-18: qty 250

## 2019-10-18 MED ORDER — DEXAMETHASONE SODIUM PHOSPHATE 10 MG/ML IJ SOLN
INTRAMUSCULAR | Status: AC
  Filled 2019-10-18: qty 1

## 2019-10-18 MED ORDER — HYDROCODONE-ACETAMINOPHEN 5-325 MG PO TABS: 1.0000 | ORAL_TABLET | Freq: Four times a day (QID) | ORAL | 0 refills | Status: DC | PRN

## 2019-10-18 MED ORDER — GLYCOPYRROLATE 0.2 MG/ML IJ SOLN
INTRAMUSCULAR | Status: AC
  Filled 2019-10-18: qty 1

## 2019-10-18 MED ORDER — PHENYLEPHRINE HCL (PRESSORS) 10 MG/ML IV SOLN
INTRAVENOUS | Status: DC | PRN
  Administered 2019-10-18 (×5): 100 ug via INTRAVENOUS
  Administered 2019-10-18: 200 ug via INTRAVENOUS

## 2019-10-18 MED ORDER — FENTANYL CITRATE (PF) 100 MCG/2ML IJ SOLN
INTRAMUSCULAR | Status: AC
  Filled 2019-10-18: qty 2

## 2019-10-18 MED ORDER — DEXAMETHASONE SODIUM PHOSPHATE 10 MG/ML IJ SOLN
INTRAMUSCULAR | Status: DC | PRN
  Administered 2019-10-18: 5 mg via INTRAVENOUS

## 2019-10-18 MED ORDER — CEFAZOLIN SODIUM-DEXTROSE 2-4 GM/100ML-% IV SOLN
INTRAVENOUS | Status: AC
  Filled 2019-10-18: qty 100

## 2019-10-18 MED ORDER — CARVEDILOL 12.5 MG PO TABS: 12.5000 mg | ORAL_TABLET | Freq: Two times a day (BID) | ORAL | Status: DC

## 2019-10-18 MED ORDER — PHENYLEPHRINE HCL (PRESSORS) 10 MG/ML IV SOLN
INTRAVENOUS | Status: AC
  Filled 2019-10-18: qty 1

## 2019-10-18 MED ORDER — ORAL CARE MOUTH RINSE: 15.0000 mL | Freq: Once | OROMUCOSAL | Status: AC

## 2019-10-18 MED ORDER — SODIUM CHLORIDE 0.9 % IV SOLN: INTRAVENOUS | Status: DC

## 2019-10-18 MED ORDER — PROPOFOL 10 MG/ML IV BOLUS
INTRAVENOUS | Status: DC | PRN
  Administered 2019-10-18: 50 mg via INTRAVENOUS
  Administered 2019-10-18: 150 mg via INTRAVENOUS

## 2019-10-18 MED ORDER — BUPIVACAINE-EPINEPHRINE 0.5% -1:200000 IJ SOLN
INTRAMUSCULAR | Status: DC | PRN
  Administered 2019-10-18: 10 mL

## 2019-10-18 MED ORDER — PROPOFOL 10 MG/ML IV BOLUS
INTRAVENOUS | Status: AC
  Filled 2019-10-18: qty 20

## 2019-10-18 MED ORDER — LIDOCAINE HCL (PF) 2 % IJ SOLN
INTRAMUSCULAR | Status: AC
  Filled 2019-10-18: qty 5

## 2019-10-18 MED ORDER — LIDOCAINE HCL 1 % IJ SOLN
INTRAMUSCULAR | Status: DC | PRN
  Administered 2019-10-18: 10 mL

## 2019-10-18 MED ORDER — ONDANSETRON HCL 4 MG/2ML IJ SOLN
INTRAMUSCULAR | Status: AC
  Filled 2019-10-18: qty 2

## 2019-10-18 MED ORDER — CARVEDILOL 12.5 MG PO TABS
ORAL_TABLET | ORAL | Status: AC
  Filled 2019-10-18: qty 1

## 2019-10-18 MED ORDER — BUPIVACAINE-EPINEPHRINE (PF) 0.5% -1:200000 IJ SOLN
INTRAMUSCULAR | Status: AC
  Filled 2019-10-18: qty 30

## 2019-10-18 SURGICAL SUPPLY — 49 items
ADH SKN CLS APL DERMABOND .7 (GAUZE/BANDAGES/DRESSINGS) ×1
APL PRP STRL LF DISP 70% ISPRP (MISCELLANEOUS) ×1
APPLIER CLIP 11 MED OPEN (CLIP)
APR CLP MED 11 20 MLT OPN (CLIP)
BLADE SURG 15 STRL LF DISP TIS (BLADE) ×1 IMPLANT
BLADE SURG 15 STRL SS (BLADE) ×3
CANISTER SUCT 1200ML W/VALVE (MISCELLANEOUS) ×3 IMPLANT
CHLORAPREP W/TINT 26 (MISCELLANEOUS) ×3 IMPLANT
CLIP APPLIE 11 MED OPEN (CLIP) IMPLANT
CNTNR SPEC 2.5X3XGRAD LEK (MISCELLANEOUS)
CONT SPEC 4OZ STER OR WHT (MISCELLANEOUS)
CONT SPEC 4OZ STRL OR WHT (MISCELLANEOUS)
CONTAINER SPEC 2.5X3XGRAD LEK (MISCELLANEOUS) IMPLANT
COVER WAND RF STERILE (DRAPES) ×1 IMPLANT
DERMABOND ADVANCED (GAUZE/BANDAGES/DRESSINGS) ×2
DERMABOND ADVANCED .7 DNX12 (GAUZE/BANDAGES/DRESSINGS) ×1 IMPLANT
DEVICE DSSCT PLSMBLD 3.0S LGHT (MISCELLANEOUS) ×1 IMPLANT
DEVICE DUBIN SPECIMEN MAMMOGRA (MISCELLANEOUS) ×3 IMPLANT
DRAPE LAPAROTOMY 77X122 PED (DRAPES) ×3 IMPLANT
ELECT CAUTERY BLADE TIP 2.5 (TIP) ×3
ELECT REM PT RETURN 9FT ADLT (ELECTROSURGICAL) ×3
ELECTRODE CAUTERY BLDE TIP 2.5 (TIP) ×1 IMPLANT
ELECTRODE REM PT RTRN 9FT ADLT (ELECTROSURGICAL) ×1 IMPLANT
GLOVE BIOGEL PI IND STRL 7.0 (GLOVE) ×1 IMPLANT
GLOVE BIOGEL PI INDICATOR 7.0 (GLOVE) ×6
GLOVE SURG SYN 6.5 ES PF (GLOVE) ×9 IMPLANT
GLOVE SURG SYN 6.5 PF PI (GLOVE) ×2 IMPLANT
GOWN STRL REUS W/ TWL LRG LVL3 (GOWN DISPOSABLE) ×3 IMPLANT
GOWN STRL REUS W/TWL LRG LVL3 (GOWN DISPOSABLE) ×9
KIT MARKER MARGIN INK (KITS) ×2 IMPLANT
KIT TURNOVER KIT A (KITS) ×3 IMPLANT
LABEL OR SOLS (LABEL) ×3 IMPLANT
LIGHT WAVEGUIDE WIDE FLAT (MISCELLANEOUS) IMPLANT
MARKER MARGIN CORRECT CLIP (MARKER) ×2 IMPLANT
NEEDLE HYPO 22GX1.5 SAFETY (NEEDLE) ×6 IMPLANT
PACK BASIN MINOR (MISCELLANEOUS) ×3 IMPLANT
PLASMABLADE 3.0S W/LIGHT (MISCELLANEOUS) ×3
SET LOCALIZER 20 PROBE US (MISCELLANEOUS) ×3 IMPLANT
SUT MNCRL 4-0 (SUTURE) ×6
SUT MNCRL 4-0 27XMFL (SUTURE) ×2
SUT SILK 3 0 12 30 (SUTURE) IMPLANT
SUT VIC AB 3-0 SH 27 (SUTURE) ×6
SUT VIC AB 3-0 SH 27X BRD (SUTURE) ×2 IMPLANT
SUTURE MNCRL 4-0 27XMF (SUTURE) ×2 IMPLANT
SYR 20ML LL LF (SYRINGE) ×3 IMPLANT
SYR BULB IRRIG 60ML STRL (SYRINGE) ×3 IMPLANT
TUBING CONNECTING 10 (TUBING) ×2 IMPLANT
TUBING CONNECTING 10' (TUBING) ×1
WATER STERILE IRR 1000ML POUR (IV SOLUTION) ×3 IMPLANT

## 2019-10-18 NOTE — Anesthesia Procedure Notes (Signed)
Procedure Name: LMA Insertion Date/Time: 10/18/2019 7:48 AM Performed by: Nolon Lennert, RN Pre-anesthesia Checklist: Patient identified, Patient being monitored, Timeout performed, Emergency Drugs available and Suction available Patient Re-evaluated:Patient Re-evaluated prior to induction Oxygen Delivery Method: Circle system utilized Preoxygenation: Pre-oxygenation with 100% oxygen Induction Type: IV induction Ventilation: Mask ventilation without difficulty LMA: LMA inserted LMA Size: 3.0 Tube type: Oral Number of attempts: 1 Placement Confirmation: positive ETCO2 and breath sounds checked- equal and bilateral Tube secured with: Tape Dental Injury: Teeth and Oropharynx as per pre-operative assessment

## 2019-10-18 NOTE — Anesthesia Postprocedure Evaluation (Signed)
Anesthesia Post Note  Patient: Katherine Rosales  Procedure(s) Performed: RF TAG GUIDED EXCISIONAL BREAST BIOPSY (Right )  Patient location during evaluation: PACU Anesthesia Type: General Level of consciousness: awake and alert Pain management: pain level controlled Vital Signs Assessment: post-procedure vital signs reviewed and stable Respiratory status: spontaneous breathing and respiratory function stable Cardiovascular status: stable Anesthetic complications: no   No complications documented.   Last Vitals:  Vitals:   10/18/19 0620 10/18/19 0847  BP: 116/66 (!) 101/58  Pulse: 88 77  Resp: 12 12  Temp: (!) 36.3 C 36.5 C  SpO2: 100% 97%    Last Pain:  Vitals:   10/18/19 0847  PainSc: Asleep                 Labaron Digirolamo K

## 2019-10-18 NOTE — Progress Notes (Addendum)
   10/18/19 0725  Clinical Encounter Type  Visited With Patient  Visit Type Initial;Spiritual support  Referral From Chaplain  Consult/Referral To Chaplain  Chaplain briefly visited with patient before surgery. Chaplain also asked if she could pray with her and patient said yes. Chaplain prayed, wished patient well, and left. Patient also spoke with patient's sister in [place] waiting area.

## 2019-10-18 NOTE — Interval H&P Note (Signed)
History and Physical Interval Note:  10/18/2019 7:25 AM  Katherine Rosales  has presented today for surgery, with the diagnosis of R92.8 - Abnormal mammogram.   We again discussed reasoning behind a full excisional biopsy, due to the discordant imaging studies and worsening abnormalities noted on most recent mammogram.  The various methods of treatment have been discussed with the patient and family. After consideration of risks, benefits and other options for treatment, the patient has consented to  Procedure(s): RF TAG GUIDED EXCISIONAL BREAST BIOPSY (Right) as a surgical intervention.  The patient's history has been reviewed, patient examined, no change in status, stable for surgery.  I have reviewed the patient's chart and labs.  Questions were answered to the patient's satisfaction.     Macil Crady Lysle Pearl

## 2019-10-18 NOTE — H&P (Signed)
CC: Abnormal mammogram [R92.8] HPI: Katherine Rosales is a 69 y.o. female who returns for evaluation of above. Area of concern now expanded since last exam.  No changes on physical exam or reported re: breast. Currently workup for possible lump on right flank area.  Past Medical History: has a past medical history of Anxiety, Cancer of right breast (CMS-HCC), Chronic back pain, Depression, Diabetes mellitus type 2, uncomplicated (CMS-HCC), History of nephrotic syndrome, History of sleep apnea, Hypertension, Insomnia, Liver cirrhosis secondary to NASH (CMS-HCC), Nonischemic cardiomyopathy (CMS-HCC), Obesity, and Thyroid disease.  Past Surgical History: has a past surgical history that includes Post CA of right breast with lumpectomy and radiation; Cholecystectomy; Insertion of defibrillator pacemaker; Right foot surgery; Benign cysts removal from right breast, 08/2009; Hysterectomy; Colonoscopy (01/11/2003); Colonoscopy (03/05/2013); egd (01/11/2003); egd (06/25/2015); Colonoscopy (10/03/2017); Left total knee arthroplasty using computer-assisted navigation (10/24/2008); and Laparoscopic tubal ligation.  Family History: family history includes No Known Problems in her brother, mother, and sister; Prostate cancer in her father.  Social History: reports that she has never smoked. She has never used smokeless tobacco. She reports that she does not drink alcohol and does not use drugs.  Current Medications: has a current medication list which includes the following prescription(s): accu-chek aviva plus test strp, aspirin, blood glucose meter, calcium carbonate-vitamin d3, carvedilol, century adults 50 plus, dok, duloxetine, furosemide, gabapentin, glimepiride, i-vite, lancets, levothyroxine, lisinopril, omeprazole, semaglutide, and spironolactone.  Allergies:  Allergies as of 08/22/2019 - Reviewed 08/22/2019  Allergen Reaction Noted  . Ibuprofen Liver Disorder 11/01/2018   ROS:  A 15 point review of  systems was performed and was negative except as noted in HPI  Objective:    BP 118/69  Pulse 89  Ht 149.9 cm (4' 11" )  Wt 98.4 kg (217 lb)  BMI 43.83 kg/m   Constitutional : alert, appears stated age, cooperative and no distress  Lymphatics/Throat: no asymmetry, masses, or scars  Respiratory: clear to auscultation bilaterally  Cardiovascular: regular rate and rhythm  Gastrointestinal: soft, non-tender; bowel sounds normal; no masses, no organomegaly.  Musculoskeletal: Steady gait and movement  Skin: Cool and moist. Former lumpectomy sites notes on right breast, with area of concern deep to the incision closer to areola.  Psychiatric: Normal affect, non-agitated, not confused  Breast: Chaperone present for exam. breasts appear normal, no suspicious masses, no skin or nipple changes or axillary nodes.   LABS:  SURGICAL PATHOLOGY SURGICAL PATHOLOGY CASE: [id number] PATIENT: Katherine Rosales Surgical Pathology Report  Specimen Submitted: A. Breast, right  Clinical History: Distortion. R/O radial scar vs IMC; X-shaped clip placed following stereotactic biopsy of RIGHT upper outer quadrant  DIAGNOSIS: A. BREAST, RIGHT UPPER OUTER QUADRANT; STEREOTACTIC CORE BIOPSY: - BENIGN MAMMARY PARENCHYMA WITH DENSE STROMAL FIBROSIS. - NEGATIVE FOR ATYPICAL PROLIFERATIVE BREAST DISEASE.  GROSS DESCRIPTION: A. Labeled: Right breast upper outer quadrant stereo cores biopsy, distortion Received: in a formalin-filled CoreTainer transport system Accompanying specimen radiograph: No Time / date in fixative: Tissue procedure time 8:19 AM, tissue put in formalin time 8:22 AM 12/20/2018 Cold ischemic time: 3 minutes Total fixation time: 12 hours Core pieces: Multiple Measurement: Ranging from 0.5 to 1.2 cm in length and 0.2 cm in width Description / comments: Fibrofatty soft tissue cores Inked: Green Entirely submitted. Block summary: 1-3-entirely submitted breast cores  Final Diagnosis  performed by Allena Napoleon, MD.  Electronically signed 12/21/2018 2:15:02PM The electronic signature indicates that the named Attending Pathologist has evaluated the specimen Technical component performed at [facility], [address], [place],  [place] [address] Lab: [phone] Dir: Rush Farmer, MD, MMM Professional component performed at [facility], [facility], [place], [place], [place] [address] Lab: [phone] Dir: Dellia Nims. Rubinas, MD     RADS: CLINICAL DATA: Six-month interval follow-up after biopsy of  architectural distortion in the UPPER subareolar RIGHT breast with  pathology demonstrating stromal fibrosis.   Personal history of malignant RIGHT breast lumpectomy in 1996 with  adjuvant radiation therapy and chemotherapy, though the lumpectomy  was performed in the UPPER OUTER QUADRANT of the RIGHT breast, not  in the UPPER subareolar location.   EXAM:  DIGITAL DIAGNOSTIC UNILATERAL RIGHT MAMMOGRAM WITH CAD AND TOMO   COMPARISON: Previous exam(s).   ACR Breast Density Category c: The breast tissue is heterogeneously  dense, which may obscure small masses.   FINDINGS:  Tomosynthesis and synthesized full field CC and MLO views of the  RIGHT breast were obtained.   The architectural distortion in the UPPER subareolar location is  even more prominent on the current examination when compared to the  examination 6 months ago. There is no visible associated mass. The X  shaped tissue marker clip placed at the time of the biopsy 6 months  ago is within the POSTERIOR portion of the distortion.   Post surgical scar/architectural distortion at the lumpectomy site  in the [place] at MIDDLE to POSTERIOR depth, separated  from the architectural distortion in the UPPER subareolar location  by approximately 4 cm. No new findings elsewhere in the RIGHT  breast.   Mammographic images were processed with CAD.   IMPRESSION:  The architectural  distortion in the UPPER subareolar RIGHT breast,  biopsy proven stromal fibrosis, is more prominent on the current  examination. The X shaped tissue marker clip is within the POSTERIOR  portion of the distortion.   RECOMMENDATION:  1. In the absence of a sonographic correlate on the prior  ultrasound, I would consider excisional biopsy of thedistortion in  order to exclude a high risk complex sclerosing lesion or lobular  neoplasia/malignancy. (Typically, stromal fibrosis will have an  associated ultrasound correlate which was not present on the  diagnostic ultrasound 6 months ago).  2. If surgical excision is not performed, diagnostic BILATERAL  mammography is recommended in September, 2021 (which will be 1 year  since the initial workup).  3. If surgical excision is performed, the patient may return to  screening mammography in September, 2021 (in the absence of  malignancy).   I have discussed the findings and recommendations with the patient.  If applicable, a reminder letter will be sent to the patient  regarding the next appointment.   BI-RADS CATEGORY 4: Suspicious.   Electronically Signed  By: Evangeline Dakin M.D.  On: 08/13/2019 15:27  Assessment:   Abnormal mammogram [R92.8]  Plan:    1. Abnormal mammogram [R92.8] Due to change since previous exam, will recommend full excisional biopsy to confirm no occult lesion now.   Discussed the risk of surgery including recurrence, chronic pain, post-op infxn, poor/delayed wound healing, poor cosmesis, seroma, hematoma formation, and possible re-operation to address said risks. The risks of general anesthetic, if used, includes MI, CVA, sudden death or even reaction to anesthetic medications also discussed.  Typical post-op recovery time and possbility of activity restrictions were also discussed. Alternatives include continued observation. Benefits include possible symptom relief, pathologic evaluation, and/or curative  excision.   The patient verbalized understanding and all questions were answered to the patient's satisfaction.  2. Patient  has elected to proceed with surgical treatment. Procedure will be scheduled. RF localization, no SLNB. Written consent was obtained. RIGHT side. Hold aspirin

## 2019-10-18 NOTE — Anesthesia Preprocedure Evaluation (Signed)
Anesthesia Evaluation  Patient identified by MRN, date of birth, ID band Patient awake    Reviewed: Allergy & Precautions, NPO status , Patient's Chart, lab work & pertinent test results  History of Anesthesia Complications Negative for: history of anesthetic complications  Airway Mallampati: III       Dental   Pulmonary sleep apnea (Not using CPAP) , neg COPD, Not current smoker,           Cardiovascular hypertension, Pt. on medications and Pt. on home beta blockers +CHF (LVEF 30%)  + Cardiac Defibrillator      Neuro/Psych neg Seizures Anxiety Depression    GI/Hepatic neg GERD  ,(+) Hepatitis - ([facility])  Endo/Other  diabetes, Type 2, Oral Hypoglycemic AgentsHypothyroidism   Renal/GU Renal InsufficiencyRenal disease     Musculoskeletal   Abdominal   Peds  Hematology  (+) anemia ,   Anesthesia Other Findings   Reproductive/Obstetrics                             Anesthesia Physical Anesthesia Plan  ASA: III  Anesthesia Plan: General   Post-op Pain Management:    Induction: Intravenous  PONV Risk Score and Plan: 3 and Ondansetron, Dexamethasone and Midazolam  Airway Management Planned: LMA  Additional Equipment:   Intra-op Plan:   Post-operative Plan:   Informed Consent: I have reviewed the patients History and Physical, chart, labs and discussed the procedure including the risks, benefits and alternatives for the proposed anesthesia with the patient or authorized representative who has indicated his/her understanding and acceptance.       Plan Discussed with:   Anesthesia Plan Comments:         Anesthesia Quick Evaluation

## 2019-10-18 NOTE — Transfer of Care (Signed)
Immediate Anesthesia Transfer of Care Note  Patient: Katherine Rosales  Procedure(s) Performed: RF TAG GUIDED EXCISIONAL BREAST BIOPSY (Right )  Patient Location: PACU  Anesthesia Type:General  Level of Consciousness: sedated  Airway & Oxygen Therapy: Patient Spontanous Breathing and Patient connected to face mask oxygen  Post-op Assessment: Report given to RN and Post -op Vital signs reviewed and stable  Post vital signs: Reviewed  Last Vitals:  Vitals Value Taken Time  BP    Temp    Pulse 74 10/18/19 0846  Resp    SpO2 97 % 10/18/19 0846  Vitals shown include unvalidated device data.  Last Pain:  Vitals:   10/18/19 0620  PainSc: 0-No pain         Complications: No complications documented.

## 2019-10-18 NOTE — Op Note (Signed)
Preoperative diagnosis: Abnormal mammogram Postoperative diagnosis: Same  Procedure: RF tag-localized right breast lumpectomy  Anesthesia: GETA  Surgeon: Dr. Benjamine Sprague  Wound Classification: Clean  Indications: Patient is a 69 y.o. female with a nonpalpable right breast distortion noted on mammography with core biopsy demonstrating normal tissue, but due to the evolving radiologic findings on subsequent mammogram, decision was made to proceed with excisional biopsy to confirm initial biopsy results.   Specimen: Right breast mass  Complications: None  Estimated Blood Loss: 10 mL  Findings: 1. Specimen mammography shows marker and RF localizer on specimen 2. Pathology call refers gross examination of margins was negative 3. No other palpable mass node identified.   Description of procedure: RF localization was performed by radiology prior to procedure. In the nuclear medicine suite, the subareolar region was injected with Tc-99 sulfur colloid the morning of procedure. Localization studies were reviewed. The patient was taken to the operating room and placed supine on the operating table, and after general anesthesia the right breast was prepped and draped in the usual sterile fashion. A time-out was completed verifying correct patient, procedure, site, positioning, and implant(s) and/or special equipment prior to beginning this procedure.  By identifying the RF localizer, the probable trajectory and location of the mass was visualized. A skin incision was planned in such a way as to minimize the amount of dissection to reach the mass.  The skin incision was made after infusion of local. Flaps were raised and  Sharp and blunt dissection was then taken down to the mass, taking care to include the entire RF localizer and a margin of grossly normal tissue. The specimen was removed. The specimen was oriented with paint and sent to radiology with the localization studies. Confirmation was  received margins initially negative.  Wound irrigated, hemostasis was achieved and the wound closed in layers with  interrupted sutures of 3-0 Vicryl in deep dermal layer and a running subcuticular suture of Monocryl 4-0, then dressed with dermabond. The patient tolerated the procedure well and was taken to the postanesthesia care unit in stable condition. Sponge and instrument count correct at end of procedure.

## 2019-10-18 NOTE — Discharge Instructions (Signed)
Removal, Care After This sheet gives you information about how to care for yourself after your procedure. Your health care provider may also give you more specific instructions. If you have problems or questions, contact your health care provider. What can I expect after the procedure? After the procedure, it is common to have:  Soreness.  Bruising.  Itching. Follow these instructions at home: site care Follow instructions from your health care provider about how to take care of your site. Make sure you:  Wash your hands with soap and water before and after you change your bandage (dressing). If soap and water are not available, use hand sanitizer.  Leave stitches (sutures), skin glue, or adhesive strips in place. These skin closures may need to stay in place for 2 weeks or longer. If adhesive strip edges start to loosen and curl up, you may trim the loose edges. Do not remove adhesive strips completely unless your health care provider tells you to do that.  If the area bleeds or bruises, apply gentle pressure for 10 minutes.  OK TO SHOWER IN 24HRS  Check your site every day for signs of infection. Check for:  Redness, swelling, or pain.  Fluid or blood.  Warmth.  Pus or a bad smell.  General instructions  Rest and then return to your normal activities as told by your health care provider. .  tylenol  as needed for discomfort.   .  Use narcotics, if prescribed, only when tylenol is not enough to control pain. .  325-628m every 8hrs to max of 30039m24hrs (including the 32530mn every norco dose) for the tylenol.   .   RESUME ASPIRIN 48HRS AFTER SURGERY  Keep all follow-up visits as told by your health care provider. This is important. Contact a health care provider if:  You have redness, swelling, or pain around your site.  You have fluid or blood coming from your site.  Your site feels warm to the touch.  You have pus or a bad smell coming from your site.  You  have a fever.  Your sutures, skin glue, or adhesive strips loosen or come off sooner than expected. Get help right away if:  You have bleeding that does not stop with pressure or a dressing. Summary  After the procedure, it is common to have some soreness, bruising, and itching at the site.  Follow instructions from your health care provider about how to take care of your site.  Check your site every day for signs of infection.  Contact a health care provider if you have redness, swelling, or pain around your site, or your site feels warm to the touch.  Keep all follow-up visits as told by your health care provider. This is important. This information is not intended to replace advice given to you by your health care provider. Make sure you discuss any questions you have with your health care provider. Document Released: 04/18/2015 Document Revised: 09/19/2017 Document Reviewed: 09/19/2017 Elsevier Interactive Patient Education  2019 [place]) The drugs that you were given will stay in your system until tomorrow so for the next 24 hours you should not:  A) Drive an automobile B) Make any legal decisions C) Drink any alcoholic beverage   2) You may resume regular meals tomorrow.  Today it is better to start with liquids and gradually work up to solid foods.  You may eat anything you prefer, but it is  better to start with liquids, then soup and crackers, and gradually work up to solid foods.   3) Please notify your doctor immediately if you have any unusual bleeding, trouble breathing, redness and pain at the surgery site, drainage, fever, or pain not relieved by medication.    4) Additional Instructions:        Please contact your physician with any problems or Same Day Surgery at [phone], Monday through Friday 6 am to 4 pm, or [place] at [facility] number at [phone].

## 2019-10-19 ENCOUNTER — Encounter: Payer: Self-pay | Admitting: Surgery

## 2019-10-22 ENCOUNTER — Telehealth: Payer: Self-pay

## 2019-10-22 LAB — SURGICAL PATHOLOGY

## 2019-10-22 NOTE — Telephone Encounter (Signed)
PC to pt.  Pt. Stated she was aware of the water contamination issue in the [place].  Reassured pt. That the water at [facility] was free of contamination, and she would be considered very low risk of becoming infected.  Advised to monitor for any diarrhea, stomach cramps, or nausea and vomiting, and to report to her PCP.   Pt. Verb. Understanding.

## 2019-10-26 DIAGNOSIS — N189 Chronic kidney disease, unspecified: Secondary | ICD-10-CM | POA: Diagnosis not present

## 2019-10-26 DIAGNOSIS — R5383 Other fatigue: Secondary | ICD-10-CM | POA: Diagnosis not present

## 2019-10-26 DIAGNOSIS — I5022 Chronic systolic (congestive) heart failure: Secondary | ICD-10-CM | POA: Diagnosis not present

## 2019-10-26 DIAGNOSIS — I428 Other cardiomyopathies: Secondary | ICD-10-CM | POA: Diagnosis not present

## 2019-10-26 DIAGNOSIS — N183 Chronic kidney disease, stage 3 unspecified: Secondary | ICD-10-CM | POA: Diagnosis not present

## 2019-10-31 DIAGNOSIS — Z8679 Personal history of other diseases of the circulatory system: Secondary | ICD-10-CM | POA: Diagnosis not present

## 2019-10-31 DIAGNOSIS — N183 Chronic kidney disease, stage 3 unspecified: Secondary | ICD-10-CM | POA: Diagnosis not present

## 2019-10-31 DIAGNOSIS — D693 Immune thrombocytopenic purpura: Secondary | ICD-10-CM | POA: Diagnosis not present

## 2019-11-05 ENCOUNTER — Ambulatory Visit (INDEPENDENT_AMBULATORY_CARE_PROVIDER_SITE_OTHER): Payer: Medicare HMO

## 2019-11-05 DIAGNOSIS — I5022 Chronic systolic (congestive) heart failure: Secondary | ICD-10-CM | POA: Diagnosis not present

## 2019-11-05 DIAGNOSIS — Z95810 Presence of automatic (implantable) cardiac defibrillator: Secondary | ICD-10-CM | POA: Diagnosis not present

## 2019-11-07 NOTE — Progress Notes (Signed)
EPIC Encounter for ICM Monitoring  Patient Name: Katherine Rosales is a 69 y.o. female Date: 11/07/2019 Primary Care Physican: Dion Body, MD Primary [place] Electrophysiologist: [name] 8/4/2021OfficeWeight: 211lbs  Spoke with patient and reports feeling well at this time.  Denies fluid symptoms.   Optivol thoracic impedancesuggesting possible fluid accumulation since 10/15/2019 with the exception of a few days at baseline.   Prescribed:Furosemide 20 mg Take 1 tablet (20 mg total) by mouth daily. Take extra 1 extra tablet (20 mg) as needed for swelling or increased shortness of breath.  Labs: 01/09/2019 Creatinine 1.21, BUN 25, Potassium 4.1, Sodium 137, GFR 46-53 09/08/2018 Creatinine 1.30 12/27/2017 Creatinine 1.35, BUN 26, Potassium 4.7, Sodium 138, GFR 40-47  Recommendations:No changes and encouraged to call if experiencing any fluid symptoms.  Follow-up plan: ICM clinic phone appointment on9/13/2021. 91 day device clinic remote transmission8/12/2019.  EP/Cardiology Office Visits: Recall for 08/27/2020 with Dr. [name].    Copy of ICM check sent to Dr. [name]   3 month ICM trend: 11/05/2019    1 Year ICM trend:       [name], RN 11/07/2019 5:09 PM

## 2019-11-12 ENCOUNTER — Ambulatory Visit (INDEPENDENT_AMBULATORY_CARE_PROVIDER_SITE_OTHER): Payer: Medicare HMO | Admitting: *Deleted

## 2019-11-12 DIAGNOSIS — I5022 Chronic systolic (congestive) heart failure: Secondary | ICD-10-CM | POA: Diagnosis not present

## 2019-11-14 LAB — CUP PACEART REMOTE DEVICE CHECK
Battery Remaining Longevity: 53 mo
Battery Voltage: 2.98 V
Brady Statistic RV Percent Paced: 0.01 %
Date Time Interrogation Session: 20210809073725
HighPow Impedance: 104 Ohm
Implantable Lead Implant Date: 20140813
Implantable Lead Location: 753860
Implantable Lead Model: 6935
Implantable Pulse Generator Implant Date: 20140813
Lead Channel Impedance Value: 342 Ohm
Lead Channel Impedance Value: 437 Ohm
Lead Channel Pacing Threshold Amplitude: 0.875 V
Lead Channel Pacing Threshold Pulse Width: 0.4 ms
Lead Channel Sensing Intrinsic Amplitude: 13.625 mV
Lead Channel Sensing Intrinsic Amplitude: 13.625 mV
Lead Channel Setting Pacing Amplitude: 2 V
Lead Channel Setting Pacing Pulse Width: 0.4 ms
Lead Channel Setting Sensing Sensitivity: 0.3 mV

## 2019-11-15 NOTE — Progress Notes (Signed)
Remote ICD transmission.   

## 2019-11-19 DIAGNOSIS — E1122 Type 2 diabetes mellitus with diabetic chronic kidney disease: Secondary | ICD-10-CM | POA: Diagnosis not present

## 2019-11-19 DIAGNOSIS — N1832 Chronic kidney disease, stage 3b: Secondary | ICD-10-CM | POA: Diagnosis not present

## 2019-11-19 DIAGNOSIS — E1165 Type 2 diabetes mellitus with hyperglycemia: Secondary | ICD-10-CM | POA: Diagnosis not present

## 2019-11-19 DIAGNOSIS — E039 Hypothyroidism, unspecified: Secondary | ICD-10-CM | POA: Diagnosis not present

## 2019-11-19 DIAGNOSIS — I10 Essential (primary) hypertension: Secondary | ICD-10-CM | POA: Diagnosis not present

## 2019-11-19 DIAGNOSIS — E1142 Type 2 diabetes mellitus with diabetic polyneuropathy: Secondary | ICD-10-CM | POA: Diagnosis not present

## 2019-12-17 ENCOUNTER — Ambulatory Visit (INDEPENDENT_AMBULATORY_CARE_PROVIDER_SITE_OTHER): Payer: Medicare HMO

## 2019-12-17 DIAGNOSIS — I5022 Chronic systolic (congestive) heart failure: Secondary | ICD-10-CM

## 2019-12-17 DIAGNOSIS — Z95810 Presence of automatic (implantable) cardiac defibrillator: Secondary | ICD-10-CM

## 2019-12-21 NOTE — Progress Notes (Signed)
EPIC Encounter for ICM Monitoring  Patient Name: Katherine Rosales is a 69 y.o. female Date: 12/21/2019 Primary Care Physican: Dion Body, MD Primary [place] Electrophysiologist: Klein 8/4/2021OfficeWeight: 211lbs  Attempted call to patient and unable to reach.   Transmission reviewed.   Optivol thoracic impedance normal but wassuggesting possible fluid accumulation from 11/27/2019 - 12/15/2019.   Prescribed:Furosemide 20 mg Take 1 tablet (20 mg total) by mouth daily. Take extra 1 extra tablet (20 mg) as needed for swelling or increased shortness of breath.  Labs: 01/09/2019 Creatinine 1.21, BUN 25, Potassium 4.1, Sodium 137, GFR 46-53 09/08/2018 Creatinine 1.30 12/27/2017 Creatinine 1.35, BUN 26, Potassium 4.7, Sodium 138, GFR 40-47  Recommendations:Unable to reach.    Follow-up plan: ICM clinic phone appointment on10/18/2021. 91 day device clinic remote transmission11/11/2019.  EP/Cardiology Office Visits:Recall for 5/25/2022with Dr. [name].   Copy of ICM check sent to [place]   3 month ICM trend: 12/17/2019    1 Year ICM trend:       [name], RN 12/21/2019 7:55 AM

## 2020-01-17 ENCOUNTER — Other Ambulatory Visit
Admission: RE | Admit: 2020-01-17 | Discharge: 2020-01-17 | Disposition: A | Discharge status: Home / Self Care | Payer: Medicare HMO | Source: Ambulatory Visit | Attending: Family Medicine | Admitting: Family Medicine

## 2020-01-17 ENCOUNTER — Telehealth: Payer: Self-pay | Admitting: Internal Medicine

## 2020-01-17 DIAGNOSIS — I4891 Unspecified atrial fibrillation: Secondary | ICD-10-CM | POA: Diagnosis not present

## 2020-01-17 DIAGNOSIS — I498 Other specified cardiac arrhythmias: Secondary | ICD-10-CM | POA: Diagnosis not present

## 2020-01-17 DIAGNOSIS — I952 Hypotension due to drugs: Secondary | ICD-10-CM | POA: Diagnosis not present

## 2020-01-17 LAB — TROPONIN I (HIGH SENSITIVITY): Troponin I (High Sensitivity): 6 ng/L (ref <18)

## 2020-01-17 NOTE — Telephone Encounter (Signed)
PCP office calling to report patient is in office and not feeling well. According to PCP office patient has a new onsent of a fib, dizzy, low BP (carvedilol decrease by PCP) Patient is receiving 500 cc's' fluids in office PCP would like to see if this patient would be able to be seen by office  Please call nurse Amy back at [phone], you can leave a vm

## 2020-01-17 NOTE — Telephone Encounter (Signed)
Call received back from Amy at Dr. Celedonio Savage office.  She reports they last saw the patient in July and changed her lisinopril dose.  She was seen in clinic today for dizziness and noted to be hypotensive and in new onset a-fib.   Per Amy, the patient's BP was 99/70 and they are giving her 500 cc of IV fluid now.  I inquired what her HR's are running and Amy advised ~ 116 by EKG.   They have drawn several labs on the patient including a troponin/ CK-MB/ CMP/ TSH.  These results will be visible in Care Everywhere by tomorrow at the latest.  I asked Amy if the patient knew when her heart went out of rhythm and she stated the patient just hasn't felt good for a while.   I advised Amy I have no openings today, but can add on to our DOD schedule tomorrow at 3:20 pm. I will also ask the [facility] if they have received any recent transmissions on this patient and if they have not, I have asked Amy to advise the patient she may be getting a call from our [facility] this afternoon.  Amy will advise the patient of her appt tomorrow and ask her to arrive ~ 15 early.   To Device Clinic to review for a transmission.

## 2020-01-17 NOTE — Telephone Encounter (Signed)
Attempted to call Katherine Rosales back. No answer- I left a message to please call back to discuss further as I have no openings this afternoon, but could offer tomorrow.

## 2020-01-18 ENCOUNTER — Ambulatory Visit: Payer: Medicare HMO | Admitting: Cardiology

## 2020-01-18 ENCOUNTER — Encounter: Payer: Self-pay | Admitting: Cardiology

## 2020-01-18 ENCOUNTER — Other Ambulatory Visit: Payer: Self-pay

## 2020-01-18 VITALS — BP 104/70 | HR 128 | Ht 59.0 in | Wt 209.0 lb

## 2020-01-18 DIAGNOSIS — I10 Essential (primary) hypertension: Secondary | ICD-10-CM | POA: Diagnosis not present

## 2020-01-18 DIAGNOSIS — G4733 Obstructive sleep apnea (adult) (pediatric): Secondary | ICD-10-CM | POA: Diagnosis not present

## 2020-01-18 DIAGNOSIS — I428 Other cardiomyopathies: Secondary | ICD-10-CM | POA: Diagnosis not present

## 2020-01-18 DIAGNOSIS — I4891 Unspecified atrial fibrillation: Secondary | ICD-10-CM

## 2020-01-18 DIAGNOSIS — Z95810 Presence of automatic (implantable) cardiac defibrillator: Secondary | ICD-10-CM | POA: Diagnosis not present

## 2020-01-18 MED ORDER — METOPROLOL TARTRATE 25 MG PO TABS
ORAL_TABLET | ORAL | 1 refills | Status: DC
Start: 2020-01-18 — End: 2020-01-29

## 2020-01-18 MED ORDER — METOPROLOL TARTRATE 12.5 MG HALF TABLET
50.0000 mg | ORAL_TABLET | Freq: Once | ORAL | Status: AC
  Administered 2020-01-18: 50 mg via ORAL

## 2020-01-18 MED ORDER — APIXABAN 5 MG PO TABS: 5.0000 mg | ORAL_TABLET | Freq: Two times a day (BID) | ORAL | 6 refills | Status: DC

## 2020-01-18 NOTE — Telephone Encounter (Signed)
No transmission received as of yet. I will call patient to see if she can send one.

## 2020-01-18 NOTE — Telephone Encounter (Signed)
Patient states she sent manual transmission. Transmission has not been received. Patient states she is not home now. Advised patient if she goes back home before her apt. To call and someone will help her send a transmission. Verbalized understanding.

## 2020-01-18 NOTE — Progress Notes (Signed)
Cardiology Office Note:    Date:  01/18/2020   ID:  Katherine Rosales, DOB Mar 13, 1951, MRN [id number]  PCP:  Katherine Body, MD  [facility] Cardiologist:  Katherine Axe, MD  [place] Electrophysiologist:  None   Referring MD: Katherine Body, MD   Chief Complaint  Patient presents with  . Other    New onset Afib. Meds reviewed verbally with patient.     History of Present Illness:    Katherine Rosales is a 69 y.o. female with a hx of NICM s/p ICD, last EF 30-35%, hypertension, diabetes, CKD stage III, OSA noncompliant with who presents due to new onset atrial fibrillation.  Patient was seen in primary care physician's office yesterday due to not feeling well.  Patient had symptoms of dizziness, low blood pressure with systolics in the 51W and palpitations.  Coreg was decreased.  EKG at the primary care physician's office showed atrial fibrillation with rapid ventricular response, heart rate 118.  Patient received 500 cc of fluid.  Patient felt better today but still a little jittery.  Manual transmission from her ICD was requested but have not been received..  Last device check on 11/12/2019 showed normal device functioning.  Last echocardiogram 12/08/2018 showed moderate to severely reduced ejection fraction, EF 30 to 35%.  Patient not sure how long she has been in atrial fibrillation.  States having low blood pressures at home with systolic in the 25E, lisinopril was decreased to 10 mg.  On follow-up with PCP yesterday, lisinopril stopped due to systolics in the 52D.  She is supposed to be on a CPAP for OSA but has not been compliant.  Past Medical History:  Diagnosis Date  . 7824 Lead-Medtronic   . Anemia 11/12/2016  . Anxiety 12/25/2013  . Automatic implantable cardioverter-defibrillator in situ   . Breast cancer ([place]) 03/1995   right breast ca with lumpectomy and chemo and rad  . Cardiomyopathy, nonischemic ([place])   . Chronic kidney disease 11/12/2016   stage 3  .  Depression   . Diabetes mellitus    Type II Diabetes  . Hypertension   . Hypothyroidism   . ICD (implantable cardiac defibrillator), singleMedtronic   . NASH (nonalcoholic steatohepatitis)   . Personal history of chemotherapy 1996   right breast ca  . Personal history of radiation therapy 1996   right breast ca  . Sleep apnea    On CPAP  . Spleen enlarged   . Systolic heart failure, chronic ([place])     Past Surgical History:  Procedure Laterality Date  . ABDOMINAL HYSTERECTOMY    . BREAST BIOPSY Right 11/26/2015   - FAT NECROSIS, FIBROSIS, AND CHRONIC INFLAMMATION.   Marland Kitchen BREAST BIOPSY Right 12/20/2018   Affirm Bx- X-clip- path pending  . BREAST BIOPSY WITH RADIO FREQUENCY LOCALIZER Right 10/18/2019   Procedure: RF TAG GUIDED EXCISIONAL BREAST BIOPSY;  Surgeon: Benjamine Sprague, DO;  Location: [facility] ORS;  Service: General;  Laterality: Right;  . BREAST EXCISIONAL BIOPSY Right 03/1995   + breast ca chemo and rad. 9:00 region  . BREAST EXCISIONAL BIOPSY Right 2011   surgical bx neg  11:00 region  . BREAST LUMPECTOMY Right 1996  . CHOLECYSTECTOMY    . COLONOSCOPY  03/05/2013  . COLONOSCOPY W/ POLYPECTOMY  03/05/2013  . COLONOSCOPY WITH PROPOFOL N/A 10/03/2017   Procedure: COLONOSCOPY WITH PROPOFOL;  Surgeon: Manya Silvas, MD;  Location: [facility] ENDOSCOPY;  Service: Endoscopy;  Laterality: N/A;  . ESOPHAGOGASTRODUODENOSCOPY  01/11/2003, 06/25/2015  . ESOPHAGOGASTRODUODENOSCOPY (  EGD) WITH PROPOFOL N/A 06/25/2015   Procedure: ESOPHAGOGASTRODUODENOSCOPY (EGD) WITH PROPOFOL;  Surgeon: Manya Silvas, MD;  Location: [facility] ENDOSCOPY;  Service: Endoscopy;  Laterality: N/A;  . FOOT SURGERY Right   . IMPLANTABLE CARDIOVERTER DEFIBRILLATOR (ICD) GENERATOR CHANGE N/A 11/15/2012   Procedure: ICD GENERATOR CHANGE;  Surgeon: Deboraha Sprang, MD;  Location: [facility] CATH LAB;  Service: Cardiovascular;  Laterality: N/A;  . JOINT REPLACEMENT  2009   Total Knee Replacement, Left  . LEAD REVISION N/A 11/15/2012    Procedure: LEAD REVISION;  Surgeon: Deboraha Sprang, MD;  Location: [facility] CATH LAB;  Service: Cardiovascular;  Laterality: N/A;  . PACEMAKER INSERTION  06/30/2005   Medtronic Virtuoso VR ICD - Nonischemic cardiomyopathy potentially related to adriamycin, class 3 congestive failture with a narrow QRS and a negative TDI  . TONSILLECTOMY    . TUBAL LIGATION      Current Medications: Current Meds  Medication Sig  . Calcium Carbonate-Vitamin D (CALTRATE 600+D) 600-400 MG-UNIT per tablet Take 1 tablet by mouth 2 (two) times daily.    . citalopram (CELEXA) 20 MG tablet Take 1 tablet by mouth daily. WILL BEGIN TO TAKE AFTER TITRATE DOWN OFF OF ZOLOFT  . docusate sodium (COLACE) 100 MG capsule Take 100 mg by mouth 2 (two) times daily.    . DULoxetine (CYMBALTA) 30 MG capsule Take 30 mg by mouth daily.  . furosemide (LASIX) 20 MG tablet Take 1 tablet (20 mg total) by mouth daily. Take extra 1 extra tablet (20 mg) as needed for swelling or increased shortness of breath.  . gabapentin (NEURONTIN) 600 MG tablet Take 600 mg by mouth 2 (two) times daily.  Marland Kitchen glimepiride (AMARYL) 4 MG tablet Take 4 mg by mouth 2 (two) times daily before lunch and supper.   Marland Kitchen HYDROcodone-acetaminophen (NORCO) 5-325 MG tablet Take 1 tablet by mouth every 6 (six) hours as needed for up to 6 doses for moderate pain.  Marland Kitchen levothyroxine (SYNTHROID, LEVOTHROID) 100 MCG tablet Take 100 mcg by mouth daily.   . Multiple Vitamins-Minerals (CENTRUM SILVER) tablet Take 1 tablet by mouth daily.    . Multiple Vitamins-Minerals (I-VITE PO) Take 1 tablet by mouth daily.  Marland Kitchen omeprazole (PRILOSEC) 20 MG capsule Take 20 mg by mouth daily.    . Semaglutide,0.25 or 0.5MG/DOS, (OZEMPIC, 0.25 OR 0.5 MG/DOSE,) 2 MG/1.5ML SOPN Inject 0.5 mg into the skin every Friday.   . [DISCONTINUED] aspirin 81 MG tablet Take 81 mg by mouth daily.  . [DISCONTINUED] carvedilol (COREG) 6.25 MG tablet Take 6.25 mg by mouth in the morning and at bedtime.  . [DISCONTINUED]  spironolactone (ALDACTONE) 25 MG tablet TAKE (1/2) TABLET BY MOUTH ONCE DAILY. (Patient taking differently: Take 12.5 mg by mouth daily. )     Allergies:   Ibuprofen   Social History   Socioeconomic History  . Marital status: Widowed    Spouse name: Not on file  . Number of children: Not on file  . Years of education: Not on file  . Highest education level: Not on file  Occupational History  . Occupation: Full time    Employer: [employer]  Tobacco Use  . Smoking status: Never Smoker  . Smokeless tobacco: Never Used  Vaping Use  . Vaping Use: Never used  Substance and Sexual Activity  . Alcohol use: No  . Drug use: No  . Sexual activity: Not on file  Other Topics Concern  . Not on file  Social History Narrative   Married   Does  not get regular exercise   Social Determinants of Health   Financial Resource Strain:   . Difficulty of Paying Living Expenses: Not on file  Food Insecurity:   . Worried About Charity fundraiser in the Last Year: Not on file  . Ran Out of Food in the Last Year: Not on file  Transportation Needs:   . Lack of Transportation (Medical): Not on file  . Lack of Transportation (Non-Medical): Not on file  Physical Activity:   . Days of Exercise per Week: Not on file  . Minutes of Exercise per Session: Not on file  Stress:   . Feeling of Stress : Not on file  Social Connections:   . Frequency of Communication with Friends and Family: Not on file  . Frequency of Social Gatherings with Friends and Family: Not on file  . Attends Religious Services: Not on file  . Active Member of Clubs or Organizations: Not on file  . Attends Archivist Meetings: Not on file  . Marital Status: Not on file     Family History: The patient's family history includes Prostate cancer in her father. There is no history of Breast cancer.  ROS:   Please see the history of present illness.     All other systems reviewed and are negative.  EKGs/Labs/Other  Studies Reviewed:    The following studies were reviewed today:   EKG:  EKG is  ordered today.  The ekg ordered today demonstrates atrial fibrillation, rapid ventricular response  Recent Labs: No results found for requested labs within last 8760 hours.  Recent Lipid Panel No results found for: CHOL, TRIG, HDL, CHOLHDL, VLDL, LDLCALC, LDLDIRECT   Risk Assessment/Calculations:     CHA2DS2-VASc Score = 5  This indicates a 7.2% annual risk of stroke. The patient's score is based upon: CHF History: 1 HTN History: 1 Diabetes History: 1 Stroke History: 0 Vascular Disease History: 0 Age Score: 1 Gender Score: 1      Physical Exam:    VS:  BP 104/70   Pulse (!) 128   Ht 4' 11"  (1.499 m)   Wt 209 lb (94.8 kg)   SpO2 96%   BMI 42.21 kg/m     Wt Readings from Last 3 Encounters:  01/18/20 209 lb (94.8 kg)  10/18/19 212 lb (96.2 kg)  09/14/19 214 lb (97.1 kg)     GEN:  Well nourished, well developed in no acute distress HEENT: Normal NECK: No JVD; No carotid bruits LYMPHATICS: No lymphadenopathy CARDIAC: Irregular irregular , tachycardic, no murmurs, rubs, gallops RESPIRATORY:  Clear to auscultation without rales, wheezing or rhonchi  ABDOMEN: Soft, non-tender, non-distended MUSCULOSKELETAL:  No edema; No deformity  SKIN: Warm and dry NEUROLOGIC:  Alert and oriented x 3 PSYCHIATRIC:  Normal affect   ASSESSMENT:    1. New onset a-fib ([place])   2. NICM (nonischemic cardiomyopathy) ([place])   3. ICD (implantable cardioverter-defibrillator) in place   4. Essential hypertension   5. OSA (obstructive sleep apnea)    PLAN:    In order of problems listed above:  1. Patient with new onset atrial fibrillation with rapid ventricular response., CHA2DS2-VASc of 5, has bled score 3.  Risk benefits of anticoagulation explained to patient.  Start Eliquis 5 mg twice daily, stop aspirin 81 mg.  Patient given 50 mg Lopressor x1 in the office today.  Start Lopressor 25 mg 4 times a  day(8am, 12pm, 4pm, 8pm) 2. History of nonischemic cardiomyopathy, Lopressor as above.  Plan to consolidate to long-acting Toprol-XL if heart rate controlled on Lopressor.  Avoiding nondihydropyridine calcium channel blockers due to LV dysfunction.` Hold Aldactone, lisinopril for now.  We will add other heart failure medications as heart rate improves and blood pressure permits.  Appears euvolemic, continue Lasix 20 mg daily. 3. Follow-up with EP regarding ICD interrogation to confirm onset of atrial fibrillation. 4. BP normal to low normal.  Lopressor as above. 5. Patient with history of sleep apnea, compliant with CPAP recommended.  Total encounter time more than 65 minutes  Greater than 50% was spent in counseling and coordination of care with the patient  Follow-up next week with Dr. [name] or myself if Dr. [name] is not available.  Medication Adjustments/Labs and Tests Ordered: Current medicines are reviewed at length with the patient today.  Concerns regarding medicines are outlined above.  Orders Placed This Encounter  Procedures  . EKG 12-Lead   Meds ordered this encounter  Medications  . metoprolol tartrate (LOPRESSOR) tablet 50 mg  . metoprolol tartrate (LOPRESSOR) 25 MG tablet    Sig: Take 1 tablet (25 mg) by mouth 4 times a day    Dispense:  120 tablet    Refill:  1    Holding coreg  . apixaban (ELIQUIS) 5 MG TABS tablet    Sig: Take 1 tablet (5 mg total) by mouth 2 (two) times daily.    Dispense:  60 tablet    Refill:  6    Patient Instructions  Medication Instructions:   1) Hold  - Aspirin - Coreg - Lisinopril - Spironolactone  2) Continue lasix (furosemide)  3) Start lopressor (metoprolol tartrate) 25 mg - take 1 tablet (25 mg) by mouth four times a day (8am/ 12pm/ 4pm/ 8pm)   4) Start eliquis 5 mg- take 1 tablet by mouth twice daily   - you are being given samples for eliquis 2.5 mg tablets: take 2 tablets (5 mg) by mouth twice daily  Samples  given: Eliquis 2.5 mg Lot: [id number] Exp: 10/2020 # 2 boxes   *If you need a refill on your cardiac medications before your next appointment, please call your pharmacy*   Lab Work: - none ordered  If you have labs (blood work) drawn today and your tests are completely normal, you will receive your results only by: Marland Kitchen MyChart Message (if you have MyChart) OR . A paper copy in the mail If you have any lab test that is abnormal or we need to change your treatment, we will call you to review the results.   Testing/Procedures: - none ordered   Follow-Up: At [facility], you and your health needs are our priority.  As part of our continuing mission to provide you with exceptional heart care, we have created designated Provider Care Teams.  These Care Teams include your primary Cardiologist (physician) and Advanced Practice Providers (APPs -  Physician Assistants and Nurse Practitioners) who all work together to provide you with the care you need, when you need it.  We recommend signing up for the patient portal called "MyChart".  Sign up information is provided on this After Visit Summary.  MyChart is used to connect with patients for Virtual Visits (Telemedicine).  Patients are able to view lab/test results, encounter notes, upcoming appointments, etc.  Non-urgent messages can be sent to your provider as well.   To learn more about what you can do with MyChart, go to [email or web address].    Your next appointment:   1 week(s)  The format for your next appointment:   In Person  Provider:   Kate Sable, MD   Other Instructions      Signed, Kate Sable, MD  01/18/2020 4:52 PM    [place]

## 2020-01-18 NOTE — Patient Instructions (Addendum)
Medication Instructions:   1) Hold  - Aspirin - Coreg - Lisinopril - Spironolactone  2) Continue lasix (furosemide)  3) Start lopressor (metoprolol tartrate) 25 mg - take 1 tablet (25 mg) by mouth four times a day (8am/ 12pm/ 4pm/ 8pm)   4) Start eliquis 5 mg- take 1 tablet by mouth twice daily   - you are being given samples for eliquis 2.5 mg tablets: take 2 tablets (5 mg) by mouth twice daily  Samples given: Eliquis 2.5 mg Lot: [id number] Exp: 10/2020 # 2 boxes   *If you need a refill on your cardiac medications before your next appointment, please call your pharmacy*   Lab Work: - none ordered  If you have labs (blood work) drawn today and your tests are completely normal, you will receive your results only by: Marland Kitchen MyChart Message (if you have MyChart) OR . A paper copy in the mail If you have any lab test that is abnormal or we need to change your treatment, we will call you to review the results.   Testing/Procedures: - none ordered   Follow-Up: At [facility], you and your health needs are our priority.  As part of our continuing mission to provide you with exceptional heart care, we have created designated Provider Care Teams.  These Care Teams include your primary Cardiologist (physician) and Advanced Practice Providers (APPs -  Physician Assistants and Nurse Practitioners) who all work together to provide you with the care you need, when you need it.  We recommend signing up for the patient portal called "MyChart".  Sign up information is provided on this After Visit Summary.  MyChart is used to connect with patients for Virtual Visits (Telemedicine).  Patients are able to view lab/test results, encounter notes, upcoming appointments, etc.  Non-urgent messages can be sent to your provider as well.   To learn more about what you can do with MyChart, go to [email or web address].    Your next appointment:   1 week(s)  The format for your next appointment:   In  Person  Provider:   Kate Sable, MD   Other Instructions

## 2020-01-18 NOTE — Telephone Encounter (Signed)
Follow up:      Patient calling back. Please call patient

## 2020-01-18 NOTE — Telephone Encounter (Signed)
The patient is coming in at 3:20 pm today- any luck on seeing or getting a transmission from her?

## 2020-01-18 NOTE — Telephone Encounter (Signed)
Calling patient to request manual transmission.  Patient states she is not at home right now but will send it when she gets home. Patient states she feels somewhat better but still feels "jittery". Patient reminded of apt. Today in [place].

## 2020-01-21 ENCOUNTER — Ambulatory Visit (INDEPENDENT_AMBULATORY_CARE_PROVIDER_SITE_OTHER): Payer: Medicare HMO

## 2020-01-21 DIAGNOSIS — I5022 Chronic systolic (congestive) heart failure: Secondary | ICD-10-CM | POA: Diagnosis not present

## 2020-01-21 DIAGNOSIS — Z95810 Presence of automatic (implantable) cardiac defibrillator: Secondary | ICD-10-CM

## 2020-01-21 NOTE — Telephone Encounter (Signed)
See ICM note for Optivol follow up 01/21/2020.

## 2020-01-21 NOTE — Progress Notes (Signed)
EPIC Encounter for ICM Monitoring  Patient Name: Katherine Rosales is a 69 y.o. female Date: 01/21/2020 Primary Care Physican: Dion Body, MD Primary Cardiologist:[place] Electrophysiologist: [name] 8/4/2021OfficeWeight: 211lbs   Spoke with patient and transmission reviewed.  She is not feeling well for the past week with shortness of breath.  She was contacted today 10/18, by device clinic RN regarding Afib rhythm and referred to [facility].   She was seen by Dr Garen Lah on 10/15 for newly onset Afib.  Explained afib can contribute to fluid accumulation and she was given 500 cc IV fluid last week.   She is staying at her sister's house at this time.    Optivol thoracic impedance suggesting possible fluid accumulation starting 12/24/2019.  Per device clinic note from Roma Kayser, RN today. 10/18, report suggest presenting VS 89-160.   Prescribed:Furosemide 20 mgTake 1 tablet (20 mg total) by mouth daily. Take extra 1 extra tablet (20 mg) as needed for swelling or increased shortness of breath.  Labs: 01/09/2019 Creatinine 1.21, BUN 25, Potassium 4.1, Sodium 137, GFR 46-53 09/08/2018 Creatinine 1.30 12/27/2017 Creatinine 1.35, BUN 26, Potassium 4.7, Sodium 138, GFR 40-47  Recommendations:  Advised to seek ER if condition worsens.    Follow-up plan: ICM clinic phone appointment on10/25/2021 to recheck fluid levels. 91 day device clinic remote transmission11/11/2019.  EP/Cardiology Office Visits: 01/24/2020 with Dr Garen Lah.  Recall for 5/25/2022with Dr. [name].   Copy of ICM check sent to Dr.Kleinand Dr Garen Lah for [facility] and review.     3 month ICM trend: 01/21/2020    1 Year ICM trend:       [name], RN 01/21/2020 2:26 PM

## 2020-01-21 NOTE — Telephone Encounter (Signed)
  1. Has your device fired? na  2. Is you device beeping? na  3. Are you experiencing draining or swelling at device site? na  4. Are you calling to see if we received your device transmission? YES  5. Have you passed out? na    Please route to [place]

## 2020-01-21 NOTE — Telephone Encounter (Signed)
Following up with patient regarding transmission. Manual transmission received 01/21/20. Presenting VS 89-160. Increased Optivol, thoracic impedence below reference line. Patient reports of no improvement since last OV on 01/18/20. Patient sounds short of breath while walking. Compliant with medications. Patient advised I will forward to the AF clinic and should receive call. Verbalized understanding.

## 2020-01-22 NOTE — Telephone Encounter (Signed)
I had Anderson Malta RN speak with Dr Garen Lah. He said he will see her on Thrusday as scheduled. No changes at this time.

## 2020-01-24 ENCOUNTER — Ambulatory Visit: Payer: Medicare HMO | Admitting: Cardiology

## 2020-01-24 ENCOUNTER — Other Ambulatory Visit: Payer: Self-pay

## 2020-01-24 ENCOUNTER — Encounter: Payer: Self-pay | Admitting: Cardiology

## 2020-01-24 VITALS — BP 110/84 | HR 136 | Ht 59.0 in | Wt 210.4 lb

## 2020-01-24 DIAGNOSIS — G4733 Obstructive sleep apnea (adult) (pediatric): Secondary | ICD-10-CM | POA: Insufficient documentation

## 2020-01-24 DIAGNOSIS — I4819 Other persistent atrial fibrillation: Secondary | ICD-10-CM | POA: Diagnosis not present

## 2020-01-24 DIAGNOSIS — Z95810 Presence of automatic (implantable) cardiac defibrillator: Secondary | ICD-10-CM

## 2020-01-24 DIAGNOSIS — I10 Essential (primary) hypertension: Secondary | ICD-10-CM | POA: Diagnosis not present

## 2020-01-24 DIAGNOSIS — I428 Other cardiomyopathies: Secondary | ICD-10-CM

## 2020-01-24 MED ORDER — FUROSEMIDE 40 MG PO TABS: 40.0000 mg | ORAL_TABLET | Freq: Every day | ORAL | 1 refills | Status: DC

## 2020-01-24 MED ORDER — DIGOXIN 250 MCG PO TABS
0.2500 mg | ORAL_TABLET | Freq: Every day | ORAL | 1 refills | Status: DC
Start: 2020-01-24 — End: 2020-01-31

## 2020-01-24 NOTE — Patient Instructions (Addendum)
Medication Instructions:  Your physician has recommended you make the following change in your medication:   INCREASE Lasix to 40 mg daily.  START Digoxin 0.25 mg (Take 2 tablets today) Then starting tomorrow take 1 tablet daily. An Rx has been sent to your pharmacy.  *If you need a refill on your cardiac medications before your next appointment, please call your pharmacy*   Lab Work: Your physician recommends that you return for lab work on Monday 01/28/20. Please have your lab drawn at the [facility] medical mall. You do not need an appt.  Lab hours are Mon-Fri 7am-6pm.   If you have labs (blood work) drawn today and your tests are completely normal, you will receive your results only by: Marland Kitchen MyChart Message (if you have MyChart) OR . A paper copy in the mail If you have any lab test that is abnormal or we need to change your treatment, we will call you to review the results.   Testing/Procedures: None ordered   Follow-Up: At [facility], you and your health needs are our priority.  As part of our continuing mission to provide you with exceptional heart care, we have created designated Provider Care Teams.  These Care Teams include your primary Cardiologist (physician) and Advanced Practice Providers (APPs -  Physician Assistants and Nurse Practitioners) who all work together to provide you with the care you need, when you need it.  We recommend signing up for the patient portal called "MyChart".  Sign up information is provided on this After Visit Summary.  MyChart is used to connect with patients for Virtual Visits (Telemedicine).  Patients are able to view lab/test results, encounter notes, upcoming appointments, etc.  Non-urgent messages can be sent to your provider as well.   To learn more about what you can do with MyChart, go to [email or web address].    Your next appointment:   Dr. [name] Tues 01/29/20 @ 8am  The format for your next appointment:   In Person  Provider:    You may see Dr. [name] or one of the following Advanced Practice Providers on your designated Care Team:    Murray Hodgkins, NP  Christell Faith, PA-C  Marrianne Mood, PA-C  Cadence Kathlen Mody, [place]    Other Instructions N/A

## 2020-01-24 NOTE — Progress Notes (Signed)
Cardiology Office Note:    Date:  01/24/2020   ID:  Katherine Rosales, DOB [birth year], MRN [id number]  PCP:  Dion Body, MD  [facility] Cardiologist:  [name], MD  [place] Electrophysiologist:  None   Referring MD: Dion Body, MD   Chief Complaint  Patient presents with  . other    1 wk f/u no complaints today. Meds reviewed verbally with pt.    History of Present Illness:    Katherine Rosales is a 69 y.o. female with a hx of NICM s/p ICD, last EF 30-35%, persistent A. fib, hypertension, diabetes, CKD stage III, OSA who presents for follow-up.  She was previously seen due to atrial fibrillation with rapid ventricular response.  She was started on Eliquis and Lopressor.  Has a history of low blood pressures and dizziness, with systolic blood pressures in the 80s.  Lisinopril and Norvasc was stopped.  Symptoms of dizziness have improved.  OptiVol increased on ICD interrogation.  Prior notes Last device check on 11/12/2019 showed normal device functioning.  Last echocardiogram 12/08/2018 showed moderate to severely reduced ejection fraction, EF 30 to 35%.  Patient not sure how long she has been in atrial fibrillation.  States having low blood pressures at home with systolic in the 24P, lisinopril was decreased to 10 mg.  On follow-up with PCP yesterday, lisinopril stopped due to systolics in the 80D.  She is supposed to be on a CPAP for OSA but has not been compliant.  Past Medical History:  Diagnosis Date  . 9833 Lead-Medtronic   . Anemia 11/12/2016  . Anxiety 12/25/2013  . Automatic implantable cardioverter-defibrillator in situ   . Breast cancer ([place]) 03/1995   right breast ca with lumpectomy and chemo and rad  . Cardiomyopathy, nonischemic ([place])   . Chronic kidney disease 11/12/2016   stage 3  . Depression   . Diabetes mellitus    Type II Diabetes  . Hypertension   . Hypothyroidism   . ICD (implantable cardiac defibrillator), singleMedtronic   .  NASH (nonalcoholic steatohepatitis)   . Personal history of chemotherapy 1996   right breast ca  . Personal history of radiation therapy 1996   right breast ca  . Sleep apnea    On CPAP  . Spleen enlarged   . Systolic heart failure, chronic ([place])     Past Surgical History:  Procedure Laterality Date  . ABDOMINAL HYSTERECTOMY    . BREAST BIOPSY Right 11/26/2015   - FAT NECROSIS, FIBROSIS, AND CHRONIC INFLAMMATION.   Marland Kitchen BREAST BIOPSY Right 12/20/2018   Affirm Bx- X-clip- path pending  . BREAST BIOPSY WITH RADIO FREQUENCY LOCALIZER Right 10/18/2019   Procedure: RF TAG GUIDED EXCISIONAL BREAST BIOPSY;  Surgeon: Benjamine Sprague, DO;  Location: [facility] ORS;  Service: General;  Laterality: Right;  . BREAST EXCISIONAL BIOPSY Right 03/1995   + breast ca chemo and rad. 9:00 region  . BREAST EXCISIONAL BIOPSY Right 2011   surgical bx neg  11:00 region  . BREAST LUMPECTOMY Right 1996  . CHOLECYSTECTOMY    . COLONOSCOPY  03/05/2013  . COLONOSCOPY W/ POLYPECTOMY  03/05/2013  . COLONOSCOPY WITH PROPOFOL N/A 10/03/2017   Procedure: COLONOSCOPY WITH PROPOFOL;  Surgeon: Manya Silvas, MD;  Location: [facility] ENDOSCOPY;  Service: Endoscopy;  Laterality: N/A;  . ESOPHAGOGASTRODUODENOSCOPY  01/11/2003, 06/25/2015  . ESOPHAGOGASTRODUODENOSCOPY (EGD) WITH PROPOFOL N/A 06/25/2015   Procedure: ESOPHAGOGASTRODUODENOSCOPY (EGD) WITH PROPOFOL;  Surgeon: Manya Silvas, MD;  Location: [facility] ENDOSCOPY;  Service: Endoscopy;  Laterality: N/A;  . FOOT SURGERY Right   . IMPLANTABLE CARDIOVERTER DEFIBRILLATOR (ICD) GENERATOR CHANGE N/A 11/15/2012   Procedure: ICD GENERATOR CHANGE;  Surgeon: Deboraha Sprang, MD;  Location: [facility] CATH LAB;  Service: Cardiovascular;  Laterality: N/A;  . JOINT REPLACEMENT  2009   Total Knee Replacement, Left  . LEAD REVISION N/A 11/15/2012   Procedure: LEAD REVISION;  Surgeon: Deboraha Sprang, MD;  Location[facility] CATH LAB;  Service: Cardiovascular;  Laterality: N/A;  . PACEMAKER INSERTION  06/30/2005    Medtronic Virtuoso VR ICD - Nonischemic cardiomyopathy potentially related to adriamycin, class 3 congestive failture with a narrow QRS and a negative TDI  . TONSILLECTOMY    . TUBAL LIGATION      Current Medications: Current Meds  Medication Sig  . apixaban (ELIQUIS) 5 MG TABS tablet Take 1 tablet (5 mg total) by mouth 2 (two) times daily.  . Calcium Carbonate-Vitamin D (CALTRATE 600+D) 600-400 MG-UNIT per tablet Take 1 tablet by mouth 2 (two) times daily.    . citalopram (CELEXA) 20 MG tablet Take 1 tablet by mouth daily. WILL BEGIN TO TAKE AFTER TITRATE DOWN OFF OF ZOLOFT  . docusate sodium (COLACE) 100 MG capsule Take 100 mg by mouth 2 (two) times daily.    . DULoxetine (CYMBALTA) 30 MG capsule Take 30 mg by mouth daily.  . furosemide (LASIX) 40 MG tablet Take 1 tablet (40 mg total) by mouth daily.  Marland Kitchen gabapentin (NEURONTIN) 600 MG tablet Take 600 mg by mouth 2 (two) times daily.  Marland Kitchen glimepiride (AMARYL) 4 MG tablet Take 4 mg by mouth 2 (two) times daily before lunch and supper.   . levothyroxine (SYNTHROID, LEVOTHROID) 100 MCG tablet Take 100 mcg by mouth daily.   . metoprolol tartrate (LOPRESSOR) 25 MG tablet Take 1 tablet (25 mg) by mouth 4 times a day  . Multiple Vitamins-Minerals (CENTRUM SILVER) tablet Take 1 tablet by mouth daily.    . Multiple Vitamins-Minerals (I-VITE PO) Take 1 tablet by mouth daily.  Marland Kitchen omeprazole (PRILOSEC) 20 MG capsule Take 20 mg by mouth daily.    . Semaglutide,0.25 or 0.5MG/DOS, (OZEMPIC, 0.25 OR 0.5 MG/DOSE,) 2 MG/1.5ML SOPN Inject 0.5 mg into the skin every Friday.   . [DISCONTINUED] furosemide (LASIX) 20 MG tablet Take 1 tablet (20 mg total) by mouth daily. Take extra 1 extra tablet (20 mg) as needed for swelling or increased shortness of breath.     Allergies:   Ibuprofen   Social History   Socioeconomic History  . Marital status: Widowed    Spouse name: Not on file  . Number of children: Not on file  . Years of education: Not on file  .  Highest education level: Not on file  Occupational History  . Occupation: Full time    Employer: [employer]  Tobacco Use  . Smoking status: Never Smoker  . Smokeless tobacco: Never Used  Vaping Use  . Vaping Use: Never used  Substance and Sexual Activity  . Alcohol use: No  . Drug use: No  . Sexual activity: Not on file  Other Topics Concern  . Not on file  Social History Narrative   Married   Does not get regular exercise   Social Determinants of Health   Financial Resource Strain:   . Difficulty of Paying Living Expenses: Not on file  Food Insecurity:   . Worried About Charity fundraiser in the Last Year: Not on file  . Ran Out of Food in the  Last Year: Not on file  Transportation Needs:   . Lack of Transportation (Medical): Not on file  . Lack of Transportation (Non-Medical): Not on file  Physical Activity:   . Days of Exercise per Week: Not on file  . Minutes of Exercise per Session: Not on file  Stress:   . Feeling of Stress : Not on file  Social Connections:   . Frequency of Communication with Friends and Family: Not on file  . Frequency of Social Gatherings with Friends and Family: Not on file  . Attends Religious Services: Not on file  . Active Member of Clubs or Organizations: Not on file  . Attends Archivist Meetings: Not on file  . Marital Status: Not on file     Family History: The patient's family history includes Prostate cancer in her father. There is no history of Breast cancer.  ROS:   Please see the history of present illness.     All other systems reviewed and are negative.  EKGs/Labs/Other Studies Reviewed:    The following studies were reviewed today:   EKG:  EKG is  ordered today.  The ekg ordered today demonstrates atrial fibrillation, rapid ventricular response  Recent Labs: No results found for requested labs within last 8760 hours.  Recent Lipid Panel No results found for: CHOL, TRIG, HDL, CHOLHDL, VLDL, LDLCALC,  LDLDIRECT   Risk Assessment/Calculations:     CHA2DS2-VASc Score = 5  This indicates a 7.2% annual risk of stroke. The patient's score is based upon: CHF History: 1 HTN History: 1 Diabetes History: 1 Stroke History: 0 Vascular Disease History: 0 Age Score: 1 Gender Score: 1      Physical Exam:    VS:  BP 110/84 (BP Location: Left Arm, Patient Position: Sitting, Cuff Size: Normal)   Pulse (!) 136   Ht 4' 11"  (1.499 m)   Wt 210 lb 6 oz (95.4 kg)   SpO2 98%   BMI 42.49 kg/m     Wt Readings from Last 3 Encounters:  01/24/20 210 lb 6 oz (95.4 kg)  01/18/20 209 lb (94.8 kg)  10/18/19 212 lb (96.2 kg)     GEN:  Well nourished, well developed in no acute distress HEENT: Normal NECK: No JVD; No carotid bruits LYMPHATICS: No lymphadenopathy CARDIAC: Irregular irregular , tachycardic, no murmurs, rubs, gallops RESPIRATORY: Decreased breath sounds at bases ABDOMEN: Soft, non-tender, non-distended MUSCULOSKELETAL:  No edema; No deformity  SKIN: Warm and dry NEUROLOGIC:  Alert and oriented x 3 PSYCHIATRIC:  Normal affect   ASSESSMENT:    1. Persistent atrial fibrillation ([place])   2. NICM (nonischemic cardiomyopathy) ([place])   3. ICD (implantable cardioverter-defibrillator) in place   4. Essential hypertension    PLAN:    In order of problems listed above:  1. Patient with persistent atrial fibrillation., CHA2DS2-VASc of 5.  With increased OptiVol, increased volume possibly driving tachycardia.  History of low normal blood pressures and LV dysfunction preventing use of calcium channel blockers.  Start digoxin 0.5 mg x 1, then 0.25 mg daily.  Continue Lopressor 25 mg 3 times daily for now, Eliquis 5 mg twice daily, stop aspirin 81 mg.  2. History of nonischemic cardiomyopathy, Lopressor as above.  OptiVol increase, likely volume overloaded.  Increase Lasix to 40 mg daily.  Check BMP in 5 days.  Plan to consolidate to long-acting Toprol-XL after heart rate is better controlled.   Follow-up with EP/Dr. [name] next week 3. Follow-up with EP regarding ICD interrogation to  confirm onset of atrial fibrillation. 4. BP  normal.  Lopressor as above.   Follow-up next week with Dr. [name]   Medication Adjustments/Labs and Tests Ordered: Current medicines are reviewed at length with the patient today.  Concerns regarding medicines are outlined above.  Orders Placed This Encounter  Procedures  . Basic metabolic panel  . EKG 12-Lead   Meds ordered this encounter  Medications  . digoxin (LANOXIN) 0.25 MG tablet    Sig: Take 1 tablet (0.25 mg total) by mouth daily.    Dispense:  32 tablet    Refill:  1  . furosemide (LASIX) 40 MG tablet    Sig: Take 1 tablet (40 mg total) by mouth daily.    Dispense:  30 tablet    Refill:  1    Patient Instructions  Medication Instructions:  Your physician has recommended you make the following change in your medication:   INCREASE Lasix to 40 mg daily.  START Digoxin 0.25 mg (Take 2 tablets today) Then starting tomorrow take 1 tablet daily. An Rx has been sent to your pharmacy.  *If you need a refill on your cardiac medications before your next appointment, please call your pharmacy*   Lab Work: Your physician recommends that you return for lab work on Monday 01/28/20. Please have your lab drawn at the [facility] medical mall. You do not need an appt.  Lab hours are Mon-Fri 7am-6pm.   If you have labs (blood work) drawn today and your tests are completely normal, you will receive your results only by: Marland Kitchen MyChart Message (if you have MyChart) OR . A paper copy in the mail If you have any lab test that is abnormal or we need to change your treatment, we will call you to review the results.   Testing/Procedures: None ordered   Follow-Up: At [facility], you and your health needs are our priority.  As part of our continuing mission to provide you with exceptional heart care, we have created designated Provider Care Teams.   These Care Teams include your primary Cardiologist (physician) and Advanced Practice Providers (APPs -  Physician Assistants and Nurse Practitioners) who all work together to provide you with the care you need, when you need it.  We recommend signing up for the patient portal called "MyChart".  Sign up information is provided on this After Visit Summary.  MyChart is used to connect with patients for Virtual Visits (Telemedicine).  Patients are able to view lab/test results, encounter notes, upcoming appointments, etc.  Non-urgent messages can be sent to your provider as well.   To learn more about what you can do with MyChart, go to [email or web address].    Your next appointment:   Dr. [name] Tues 01/29/20 @ 8am  The format for your next appointment:   In Person  Provider:   You may see Dr. [name] or one of the following Advanced Practice Providers on your designated Care Team:    Murray Hodgkins, NP  Christell Faith, PA-C  Marrianne Mood, PA-C  Cadence Tebbetts, [place]    Other Instructions N/A     Signed, Kate Sable, MD  01/24/2020 12:53 PM    [place]

## 2020-01-28 ENCOUNTER — Other Ambulatory Visit: Payer: Self-pay

## 2020-01-28 ENCOUNTER — Other Ambulatory Visit
Admission: RE | Admit: 2020-01-28 | Discharge: 2020-01-28 | Disposition: A | Discharge status: Home / Self Care | Payer: Medicare HMO | Attending: Cardiology | Admitting: Cardiology

## 2020-01-28 ENCOUNTER — Ambulatory Visit (INDEPENDENT_AMBULATORY_CARE_PROVIDER_SITE_OTHER): Payer: Medicare HMO

## 2020-01-28 DIAGNOSIS — Z95810 Presence of automatic (implantable) cardiac defibrillator: Secondary | ICD-10-CM

## 2020-01-28 DIAGNOSIS — I4819 Other persistent atrial fibrillation: Secondary | ICD-10-CM | POA: Diagnosis not present

## 2020-01-28 DIAGNOSIS — I5022 Chronic systolic (congestive) heart failure: Secondary | ICD-10-CM

## 2020-01-28 LAB — BASIC METABOLIC PANEL
Anion gap: 10 (ref 5–15)
BUN: 18 mg/dL (ref 8–23)
CO2: 27 mmol/L (ref 22–32)
Calcium: 9.8 mg/dL (ref 8.9–10.3)
Chloride: 100 mmol/L (ref 98–111)
Creatinine, Ser: 1.28 mg/dL — ABNORMAL HIGH (ref 0.44–1.00)
GFR, Estimated: 45 mL/min — ABNORMAL LOW (ref >60)
Glucose, Bld: 173 mg/dL — ABNORMAL HIGH (ref 70–99)
Potassium: 4 mmol/L (ref 3.5–5.1)
Sodium: 137 mmol/L (ref 135–145)

## 2020-01-29 ENCOUNTER — Ambulatory Visit (INDEPENDENT_AMBULATORY_CARE_PROVIDER_SITE_OTHER): Payer: Medicare HMO | Admitting: Internal Medicine

## 2020-01-29 ENCOUNTER — Encounter: Payer: Self-pay | Admitting: Internal Medicine

## 2020-01-29 VITALS — BP 102/72 | HR 71 | Ht 59.0 in | Wt 211.0 lb

## 2020-01-29 DIAGNOSIS — I4891 Unspecified atrial fibrillation: Secondary | ICD-10-CM | POA: Diagnosis not present

## 2020-01-29 DIAGNOSIS — Z79899 Other long term (current) drug therapy: Secondary | ICD-10-CM | POA: Diagnosis not present

## 2020-01-29 DIAGNOSIS — I428 Other cardiomyopathies: Secondary | ICD-10-CM | POA: Diagnosis not present

## 2020-01-29 DIAGNOSIS — Z95810 Presence of automatic (implantable) cardiac defibrillator: Secondary | ICD-10-CM

## 2020-01-29 DIAGNOSIS — I5022 Chronic systolic (congestive) heart failure: Secondary | ICD-10-CM

## 2020-01-29 DIAGNOSIS — I493 Ventricular premature depolarization: Secondary | ICD-10-CM | POA: Diagnosis not present

## 2020-01-29 MED ORDER — METOPROLOL TARTRATE 50 MG PO TABS
50.0000 mg | ORAL_TABLET | Freq: Two times a day (BID) | ORAL | 6 refills | Status: DC
Start: 2020-01-29 — End: 2020-02-21

## 2020-01-29 MED ORDER — LISINOPRIL 10 MG PO TABS
5.0000 mg | ORAL_TABLET | Freq: Every day | ORAL | Status: DC
Start: 2020-01-29 — End: 2020-04-03

## 2020-01-29 NOTE — Progress Notes (Signed)
Patient Care Team: Dion Body, MD as PCP - General (Family Medicine) Deboraha Sprang, MD as PCP - Cardiology (Cardiology)   HPI  Katherine Rosales is a 69 y.o. female Seen in followup for Medtronic  ICD implanted for nonischemic cardiomyopathy and primary prevention. She has chronic systolic failure. Generator replacement in 8/14.     History of chronic hepatitis. Followed locally and at [facility]; also followed by nephrology.   Intercurrently developed atrial fibrillation with a rapid rate.  Precipitated heart failure.  Seen and cared for by Dr. Waldron Session.  Aspirin stopped and Eliquis started.  No bleeding.  Carvedilol discontinued and metoprolol tartrate started and augmented rate control with digoxin.  Heart rate now reasonably controlled and breathing much improved.  Denies intercurrent abdominal swelling or peripheral edema. ICM clinic had noted significant changes in intrathoracic impedance.  Atrial fibrillation had been temporally related to her discontinuing her CPAP   Date Cr K Hgb  8/19 1.6 4.5 11.7  3/20 1/2  4.4 11.9  10/20 1.2 4.1 11.6  5/21 1.3 4.6 11/.8 (2/21)  10/21 1.28 4.1      DATE TEST EF   6/14 Echo   20-25 %   9/20 Echo   30-35 %            Past Medical History:  Diagnosis Date   6949 Lead-Medtronic    Anemia 11/12/2016   Anxiety 12/25/2013   Automatic implantable cardioverter-defibrillator in situ    Breast cancer ([place]) 03/1995   right breast ca with lumpectomy and chemo and rad   Cardiomyopathy, nonischemic ([place])    Chronic kidney disease 11/12/2016   stage 3   Depression    Diabetes mellitus    Type II Diabetes   Hypertension    Hypothyroidism    ICD (implantable cardiac defibrillator), singleMedtronic    NASH (nonalcoholic steatohepatitis)    Personal history of chemotherapy 1996   right breast ca   Personal history of radiation therapy 1996   right breast ca   Sleep apnea    On CPAP   Spleen enlarged    Systolic heart  failure, chronic ([place])     Past Surgical History:  Procedure Laterality Date   ABDOMINAL HYSTERECTOMY     BREAST BIOPSY Right 11/26/2015   - FAT NECROSIS, FIBROSIS, AND CHRONIC INFLAMMATION.    BREAST BIOPSY Right 12/20/2018   Affirm Bx- X-clip- path pending   BREAST BIOPSY WITH RADIO FREQUENCY LOCALIZER Right 10/18/2019   Procedure: RF TAG GUIDED EXCISIONAL BREAST BIOPSY;  Surgeon: Benjamine Sprague, DO;  Location: [facility] ORS;  Service: General;  Laterality: Right;   BREAST EXCISIONAL BIOPSY Right 03/1995   + breast ca chemo and rad. 9:00 region   BREAST EXCISIONAL BIOPSY Right 2011   surgical bx neg  11:00 region   BREAST LUMPECTOMY Right 1996   CHOLECYSTECTOMY     COLONOSCOPY  03/05/2013   COLONOSCOPY W/ POLYPECTOMY  03/05/2013   COLONOSCOPY WITH PROPOFOL N/A 10/03/2017   Procedure: COLONOSCOPY WITH PROPOFOL;  Surgeon: Manya Silvas, MD;  Location: [facility] ENDOSCOPY;  Service: Endoscopy;  Laterality: N/A;   ESOPHAGOGASTRODUODENOSCOPY  01/11/2003, 06/25/2015   ESOPHAGOGASTRODUODENOSCOPY (EGD) WITH PROPOFOL N/A 06/25/2015   Procedure: ESOPHAGOGASTRODUODENOSCOPY (EGD) WITH PROPOFOL;  Surgeon: Manya Silvas, MD;  Location: [facility] ENDOSCOPY;  Service: Endoscopy;  Laterality: N/A;   FOOT SURGERY Right    IMPLANTABLE CARDIOVERTER DEFIBRILLATOR (ICD) GENERATOR CHANGE N/A 11/15/2012   Procedure: ICD GENERATOR CHANGE;  Surgeon: Deboraha Sprang, MD;  Location: [facility] CATH LAB;  Service: Cardiovascular;  Laterality: N/A;   JOINT REPLACEMENT  2009   Total Knee Replacement, Left   LEAD REVISION N/A 11/15/2012   Procedure: LEAD REVISION;  Surgeon: Deboraha Sprang, MD;  Location: [facility] CATH LAB;  Service: Cardiovascular;  Laterality: N/A;   PACEMAKER INSERTION  06/30/2005   Medtronic Virtuoso VR ICD - Nonischemic cardiomyopathy potentially related to adriamycin, class 3 congestive failture with a narrow QRS and a negative TDI   TONSILLECTOMY     TUBAL LIGATION      Current Outpatient  Medications  Medication Sig Dispense Refill   apixaban (ELIQUIS) 5 MG TABS tablet Take 1 tablet (5 mg total) by mouth 2 (two) times daily. 60 tablet 6   Calcium Carbonate-Vitamin D (CALTRATE 600+D) 600-400 MG-UNIT per tablet Take 1 tablet by mouth 2 (two) times daily.       digoxin (LANOXIN) 0.25 MG tablet Take 1 tablet (0.25 mg total) by mouth daily. 32 tablet 1   docusate sodium (COLACE) 100 MG capsule Take 100 mg by mouth 2 (two) times daily.       DULoxetine (CYMBALTA) 30 MG capsule Take 30 mg by mouth daily.     furosemide (LASIX) 40 MG tablet Take 1 tablet (40 mg total) by mouth daily. 30 tablet 1   gabapentin (NEURONTIN) 600 MG tablet Take 600 mg by mouth 2 (two) times daily.     glimepiride (AMARYL) 4 MG tablet Take 4 mg by mouth 2 (two) times daily before lunch and supper.      levothyroxine (SYNTHROID, LEVOTHROID) 100 MCG tablet Take 100 mcg by mouth daily.      metoprolol tartrate (LOPRESSOR) 25 MG tablet Take 1 tablet (25 mg) by mouth 4 times a day 120 tablet 1   Multiple Vitamins-Minerals (CENTRUM SILVER) tablet Take 1 tablet by mouth daily.       Multiple Vitamins-Minerals (I-VITE PO) Take 1 tablet by mouth daily.     omeprazole (PRILOSEC) 20 MG capsule Take 20 mg by mouth daily.       Semaglutide,0.25 or 0.5MG/DOS, (OZEMPIC, 0.25 OR 0.5 MG/DOSE,) 2 MG/1.5ML SOPN Inject 0.5 mg into the skin every Friday.      citalopram (CELEXA) 20 MG tablet Take 1 tablet by mouth daily. WILL BEGIN TO TAKE AFTER TITRATE DOWN OFF OF ZOLOFT     No current facility-administered medications for this visit.    Allergies  Allergen Reactions   Ibuprofen Other (See Comments)    Other reaction(s): Liver Disorder     Review of Systems negative except from HPI and PMH  Physical Exam BP 102/72 (BP Location: Left Arm, Patient Position: Sitting, Cuff Size: Normal)    Pulse 71    Ht 4' 11"  (1.499 m)    Wt 211 lb (95.7 kg)    SpO2 98%    BMI 42.62 kg/m   Well developed and well  nourished in no acute distress HENT normal Neck supple with JVP-a-10  clear Device pocket well healed; without hematoma or erythema.  There is no tethering  Irregularly irregular rate and rhythm, no murmur Abd-soft with active BS No Clubbing cyanosis trace edema Skin-warm and dry A & Oriented  Grossly normal sensory and motor function  ECG atrial fibrillation at 71 Intervals-/11/38 Axis left -23   Assessment and  Plan  Nonischemic cardiomyopathy  Heart failure  acute/chronic systolic  Hypertension      PVCs   Implantable defibrillator-Medtronic        Sleep apnea  -- compliant  Atrial  fibrillation-new onset   Atrial fibrillation triggered acute heart failure.  Better.  We will continue metoprolol tartrate for rate control through cardioversion.  Then will resume carvedilol.  We will have her take metoprolol tartrate 25 4 times daily---50 twice daily  we will continue digoxin.  Check digoxin level today. Anticipate cardioversion in about 10 days.  Taking anticoagulation uninterruptedly, without bleeding.  Atrial fibrillation onset was temporally associated with the discontinuation of her CPAP.  She developed significant nocturnal dyspnea and orthopnea prompting her to resume it.  She is back on it now.  Low blood pressure prompted the discontinuation of much of her afterload reduction.  Will resume low-dose lisinopril.  Following cardioversion we will have her follow-up in the clinic with anticipation of resumption of her carvedilol instead of the metoprolol, and the resumption of her spironolactone.

## 2020-01-29 NOTE — H&P (View-Only) (Signed)
Patient Care Team: Dion Body, MD as PCP - General (Family Medicine) Deboraha Sprang, MD as PCP - Cardiology (Cardiology)   HPI  Katherine Rosales is a 69 y.o. female Seen in followup for Medtronic  ICD implanted for nonischemic cardiomyopathy and primary prevention. She has chronic systolic failure. Generator replacement in 8/14.     History of chronic hepatitis. Followed locally and at [facility]; also followed by nephrology.   Intercurrently developed atrial fibrillation with a rapid rate.  Precipitated heart failure.  Seen and cared for by Dr. Waldron Session.  Aspirin stopped and Eliquis started.  No bleeding.  Carvedilol discontinued and metoprolol tartrate started and augmented rate control with digoxin.  Heart rate now reasonably controlled and breathing much improved.  Denies intercurrent abdominal swelling or peripheral edema. ICM clinic had noted significant changes in intrathoracic impedance.  Atrial fibrillation had been temporally related to her discontinuing her CPAP   Date Cr K Hgb  8/19 1.6 4.5 11.7  3/20 1/2  4.4 11.9  10/20 1.2 4.1 11.6  5/21 1.3 4.6 11/.8 (2/21)  10/21 1.28 4.1      DATE TEST EF   6/14 Echo   20-25 %   9/20 Echo   30-35 %            Past Medical History:  Diagnosis Date  . 7209 Lead-Medtronic   . Anemia 11/12/2016  . Anxiety 12/25/2013  . Automatic implantable cardioverter-defibrillator in situ   . Breast cancer ([place]) 03/1995   right breast ca with lumpectomy and chemo and rad  . Cardiomyopathy, nonischemic ([place])   . Chronic kidney disease 11/12/2016   stage 3  . Depression   . Diabetes mellitus    Type II Diabetes  . Hypertension   . Hypothyroidism   . ICD (implantable cardiac defibrillator), singleMedtronic   . NASH (nonalcoholic steatohepatitis)   . Personal history of chemotherapy 1996   right breast ca  . Personal history of radiation therapy 1996   right breast ca  . Sleep apnea    On CPAP  . Spleen enlarged   . Systolic heart  failure, chronic ([place])     Past Surgical History:  Procedure Laterality Date  . ABDOMINAL HYSTERECTOMY    . BREAST BIOPSY Right 11/26/2015   - FAT NECROSIS, FIBROSIS, AND CHRONIC INFLAMMATION.   Marland Kitchen BREAST BIOPSY Right 12/20/2018   Affirm Bx- X-clip- path pending  . BREAST BIOPSY WITH RADIO FREQUENCY LOCALIZER Right 10/18/2019   Procedure: RF TAG GUIDED EXCISIONAL BREAST BIOPSY;  Surgeon: Benjamine Sprague, DO;  Location: [facility] ORS;  Service: General;  Laterality: Right;  . BREAST EXCISIONAL BIOPSY Right 03/1995   + breast ca chemo and rad. 9:00 region  . BREAST EXCISIONAL BIOPSY Right 2011   surgical bx neg  11:00 region  . BREAST LUMPECTOMY Right 1996  . CHOLECYSTECTOMY    . COLONOSCOPY  03/05/2013  . COLONOSCOPY W/ POLYPECTOMY  03/05/2013  . COLONOSCOPY WITH PROPOFOL N/A 10/03/2017   Procedure: COLONOSCOPY WITH PROPOFOL;  Surgeon: Manya Silvas, MD;  Location: [facility] ENDOSCOPY;  Service: Endoscopy;  Laterality: N/A;  . ESOPHAGOGASTRODUODENOSCOPY  01/11/2003, 06/25/2015  . ESOPHAGOGASTRODUODENOSCOPY (EGD) WITH PROPOFOL N/A 06/25/2015   Procedure: ESOPHAGOGASTRODUODENOSCOPY (EGD) WITH PROPOFOL;  Surgeon: Manya Silvas, MD;  Location: [facility] ENDOSCOPY;  Service: Endoscopy;  Laterality: N/A;  . FOOT SURGERY Right   . IMPLANTABLE CARDIOVERTER DEFIBRILLATOR (ICD) GENERATOR CHANGE N/A 11/15/2012   Procedure: ICD GENERATOR CHANGE;  Surgeon: Deboraha Sprang, MD;  Location: [facility] CATH LAB;  Service: Cardiovascular;  Laterality: N/A;  . JOINT REPLACEMENT  2009   Total Knee Replacement, Left  . LEAD REVISION N/A 11/15/2012   Procedure: LEAD REVISION;  Surgeon: Deboraha Sprang, MD;  Location: [facility] CATH LAB;  Service: Cardiovascular;  Laterality: N/A;  . PACEMAKER INSERTION  06/30/2005   Medtronic Virtuoso VR ICD - Nonischemic cardiomyopathy potentially related to adriamycin, class 3 congestive failture with a narrow QRS and a negative TDI  . TONSILLECTOMY    . TUBAL LIGATION      Current Outpatient  Medications  Medication Sig Dispense Refill  . apixaban (ELIQUIS) 5 MG TABS tablet Take 1 tablet (5 mg total) by mouth 2 (two) times daily. 60 tablet 6  . Calcium Carbonate-Vitamin D (CALTRATE 600+D) 600-400 MG-UNIT per tablet Take 1 tablet by mouth 2 (two) times daily.      . digoxin (LANOXIN) 0.25 MG tablet Take 1 tablet (0.25 mg total) by mouth daily. 32 tablet 1  . docusate sodium (COLACE) 100 MG capsule Take 100 mg by mouth 2 (two) times daily.      . DULoxetine (CYMBALTA) 30 MG capsule Take 30 mg by mouth daily.    . furosemide (LASIX) 40 MG tablet Take 1 tablet (40 mg total) by mouth daily. 30 tablet 1  . gabapentin (NEURONTIN) 600 MG tablet Take 600 mg by mouth 2 (two) times daily.    Marland Kitchen glimepiride (AMARYL) 4 MG tablet Take 4 mg by mouth 2 (two) times daily before lunch and supper.     . levothyroxine (SYNTHROID, LEVOTHROID) 100 MCG tablet Take 100 mcg by mouth daily.     . metoprolol tartrate (LOPRESSOR) 25 MG tablet Take 1 tablet (25 mg) by mouth 4 times a day 120 tablet 1  . Multiple Vitamins-Minerals (CENTRUM SILVER) tablet Take 1 tablet by mouth daily.      . Multiple Vitamins-Minerals (I-VITE PO) Take 1 tablet by mouth daily.    Marland Kitchen omeprazole (PRILOSEC) 20 MG capsule Take 20 mg by mouth daily.      . Semaglutide,0.25 or 0.5MG/DOS, (OZEMPIC, 0.25 OR 0.5 MG/DOSE,) 2 MG/1.5ML SOPN Inject 0.5 mg into the skin every Friday.     . citalopram (CELEXA) 20 MG tablet Take 1 tablet by mouth daily. WILL BEGIN TO TAKE AFTER TITRATE DOWN OFF OF ZOLOFT     No current facility-administered medications for this visit.    Allergies  Allergen Reactions  . Ibuprofen Other (See Comments)    Other reaction(s): Liver Disorder     Review of Systems negative except from HPI and PMH  Physical Exam BP 102/72 (BP Location: Left Arm, Patient Position: Sitting, Cuff Size: Normal)   Pulse 71   Ht 4' 11"  (1.499 m)   Wt 211 lb (95.7 kg)   SpO2 98%   BMI 42.62 kg/m   Well developed and well  nourished in no acute distress HENT normal Neck supple with JVP-a-10  clear Device pocket well healed; without hematoma or erythema.  There is no tethering  Irregularly irregular rate and rhythm, no murmur Abd-soft with active BS No Clubbing cyanosis trace edema Skin-warm and dry A & Oriented  Grossly normal sensory and motor function  ECG atrial fibrillation at 71 Intervals-/11/38 Axis left -23   Assessment and  Plan  Nonischemic cardiomyopathy  Heart failure  acute/chronic systolic  Hypertension      PVCs   Implantable defibrillator-Medtronic        Sleep apnea  -- compliant  Atrial fibrillation-new onset   Atrial  fibrillation triggered acute heart failure.  Better.  We will continue metoprolol tartrate for rate control through cardioversion.  Then will resume carvedilol.  We will have her take metoprolol tartrate 25 4 times daily---50 twice daily  we will continue digoxin.  Check digoxin level today. Anticipate cardioversion in about 10 days.  Taking anticoagulation uninterruptedly, without bleeding.  Atrial fibrillation onset was temporally associated with the discontinuation of her CPAP.  She developed significant nocturnal dyspnea and orthopnea prompting her to resume it.  She is back on it now.  Low blood pressure prompted the discontinuation of much of her afterload reduction.  Will resume low-dose lisinopril.  Following cardioversion we will have her follow-up in the clinic with anticipation of resumption of her carvedilol instead of the metoprolol, and the resumption of her spironolactone.

## 2020-01-29 NOTE — Patient Instructions (Addendum)
Medication Instructions:  - Your physician has recommended you make the following change in your medication:   1) CHANGE metoprolol tartrate to 50 mg- take 1 tablet by mouth TWICE daily  2) RESUME lisinopril 10 mg- take 0.5 tablet (5 mg) by mouth ONCE daily  *If you need a refill on your cardiac medications before your next appointment, please call your pharmacy*   Lab Work: 1)  Your physician recommends that you have lab work today: CBC/ Digoxin  2) Pre procedure COVID swab: Monday 02/11/20 (8:00 am- 1:00 pm) - [facility] - drive up test only, staff will come out to the car to swab you   If you have labs (blood work) drawn today and your tests are completely normal, you will receive your results only by: Marland Kitchen MyChart Message (if you have MyChart) OR . A paper copy in the mail If you have any lab test that is abnormal or we need to change your treatment, we will call you to review the results.   Testing/Procedures: Your physician has recommended that you have a Cardioversion (DCCV). Electrical Cardioversion uses a jolt of electricity to your heart either through paddles or wired patches attached to your chest. This is a controlled, usually prescheduled, procedure. Defibrillation is done under light anesthesia in the hospital, and you usually go home the day of the procedure. This is done to get your heart back into a normal rhythm. You are not awake for the procedure.   You are scheduled for a Cardioversion on Wednesday 02/13/20 with Dr. Kate Sable.  Please arrive at the [place] [facility] at 6:30 a.m. on the day of your procedure.  DIET INSTRUCTIONS:  Nothing to eat or drink after midnight the night prior to your procedure.         1) Labs: as above  2) Medications:  You may take all of your regular medications the morning of your procedure with enough water to get them down safely unless listed below  - HOLD furosemide, glimepiride/ or any insulin the morning  of your procedure - if taking any night time insulin, only take a 1/2 of your usual dose the night prior to your procedure  3) Must have a responsible person to drive you home.  4) Bring a current list of your medications and current insurance cards.    If you have any questions after you get home, please call the office at [phone]    Follow-Up: At [facility], you and your health needs are our priority.  As part of our continuing mission to provide you with exceptional heart care, we have created designated Provider Care Teams.  These Care Teams include your primary Cardiologist (physician) and Advanced Practice Providers (APPs -  Physician Assistants and Nurse Practitioners) who all work together to provide you with the care you need, when you need it.  We recommend signing up for the patient portal called "MyChart".  Sign up information is provided on this After Visit Summary.  MyChart is used to connect with patients for Virtual Visits (Telemedicine).  Patients are able to view lab/test results, encounter notes, upcoming appointments, etc.  Non-urgent messages can be sent to your provider as well.   To learn more about what you can do with MyChart, go to [email or web address].    Your next appointment:   4 week(s)  The format for your next appointment:   In Person  Provider:   You will see one of the following Advanced  Practice Providers on your designated Care Team:    Murray Hodgkins, NP  Christell Faith, PA-C  Marrianne Mood, PA-C  Cadence Kathlen Mody, [place]  Then, [name], MD will plan to see you again in 3-4 month(s).    Other Instructions   Electrical Cardioversion Electrical cardioversion is the delivery of a jolt of electricity to restore a normal rhythm to the heart. A rhythm that is too fast or is not regular keeps the heart from pumping well. In this procedure, sticky patches or metal paddles are placed on the chest to deliver electricity to the heart from  a device. This procedure may be done in an emergency if:  There is low or no blood pressure as a result of the heart rhythm.  Normal rhythm must be restored as fast as possible to protect the brain and heart from further damage.  It may save a life. This may also be a scheduled procedure for irregular or fast heart rhythms that are not immediately life-threatening. Tell a health care provider about:  Any allergies you have.  All medicines you are taking, including vitamins, herbs, eye drops, creams, and over-the-counter medicines.  Any problems you or family members have had with anesthetic medicines.  Any blood disorders you have.  Any surgeries you have had.  Any medical conditions you have.  Whether you are pregnant or may be pregnant. What are the risks? Generally, this is a safe procedure. However, problems may occur, including:  Allergic reactions to medicines.  A blood clot that breaks free and travels to other parts of your body.  The possible return of an abnormal heart rhythm within hours or days after the procedure.  Your heart stopping (cardiac arrest). This is rare. What happens before the procedure? Medicines  Your health care provider may have you start taking: ? Blood-thinning medicines (anticoagulants) so your blood does not clot as easily. ? Medicines to help stabilize your heart rate and rhythm.  Ask your health care provider about: ? Changing or stopping your regular medicines. This is especially important if you are taking diabetes medicines or blood thinners. ? Taking medicines such as aspirin and ibuprofen. These medicines can thin your blood. Do not take these medicines unless your health care provider tells you to take them. ? Taking over-the-counter medicines, vitamins, herbs, and supplements. General instructions  Follow instructions from your health care provider about eating or drinking restrictions.  Plan to have someone take you home  from the hospital or clinic.  If you will be going home right after the procedure, plan to have someone with you for 24 hours.  Ask your health care provider what steps will be taken to help prevent infection. These may include washing your skin with a germ-killing soap. What happens during the procedure?   An IV will be inserted into one of your veins.  Sticky patches (electrodes) or metal paddles may be placed on your chest.  You will be given a medicine to help you relax (sedative).  An electrical shock will be delivered. The procedure may vary among health care providers and hospitals. What can I expect after the procedure?  Your blood pressure, heart rate, breathing rate, and blood oxygen level will be monitored until you leave the hospital or clinic.  Your heart rhythm will be watched to make sure it does not change.  You may have some redness on the skin where the shocks were given. Follow these instructions at home:  Do not drive for 24  hours if you were given a sedative during your procedure.  Take over-the-counter and prescription medicines only as told by your health care provider.  Ask your health care provider how to check your pulse. Check it often.  Rest for 48 hours after the procedure or as told by your health care provider.  Avoid or limit your caffeine use as told by your health care provider.  Keep all follow-up visits as told by your health care provider. This is important. Contact a health care provider if:  You feel like your heart is beating too quickly or your pulse is not regular.  You have a serious muscle cramp that does not go away. Get help right away if:  You have discomfort in your chest.  You are dizzy or you feel faint.  You have trouble breathing or you are short of breath.  Your speech is slurred.  You have trouble moving an arm or leg on one side of your body.  Your fingers or toes turn cold or blue. Summary  Electrical  cardioversion is the delivery of a jolt of electricity to restore a normal rhythm to the heart.  This procedure may be done right away in an emergency or may be a scheduled procedure if the condition is not an emergency.  Generally, this is a safe procedure.  After the procedure, check your pulse often as told by your health care provider. This information is not intended to replace advice given to you by your health care provider. Make sure you discuss any questions you have with your health care provider. Document Revised: 10/23/2018 Document Reviewed: 10/23/2018 Elsevier Patient Education  [place].

## 2020-01-30 LAB — CBC WITH DIFFERENTIAL/PLATELET
Basophils Absolute: 0 10*3/uL (ref 0.0–0.2)
Basos: 1 %
EOS (ABSOLUTE): 0.2 10*3/uL (ref 0.0–0.4)
Eos: 3 %
Hematocrit: 37.6 % (ref 34.0–46.6)
Hemoglobin: 12.5 g/dL (ref 11.1–15.9)
Immature Grans (Abs): 0 10*3/uL (ref 0.0–0.1)
Immature Granulocytes: 0 %
Lymphocytes Absolute: 1.7 10*3/uL (ref 0.7–3.1)
Lymphs: 29 %
MCH: 29 pg (ref 26.6–33.0)
MCHC: 33.2 g/dL (ref 31.5–35.7)
MCV: 87 fL (ref 79–97)
Monocytes Absolute: 0.8 10*3/uL (ref 0.1–0.9)
Monocytes: 13 %
Neutrophils Absolute: 3.2 10*3/uL (ref 1.4–7.0)
Neutrophils: 54 %
Platelets: 150 10*3/uL (ref 150–450)
RBC: 4.31 x10E6/uL (ref 3.77–5.28)
RDW: 13 % (ref 11.7–15.4)
WBC: 5.9 10*3/uL (ref 3.4–10.8)

## 2020-01-30 LAB — DIGOXIN LEVEL: Digoxin, Serum: 2.3 ng/mL (ref 0.5–0.9)

## 2020-01-31 ENCOUNTER — Telehealth: Payer: Self-pay

## 2020-01-31 MED ORDER — DIGOXIN 125 MCG PO TABS
0.0625 mg | ORAL_TABLET | Freq: Every day | ORAL | 3 refills | Status: DC
Start: 2020-01-31 — End: 2020-02-21

## 2020-01-31 NOTE — Telephone Encounter (Signed)
Spoke with pt ans advised Per Dr [name] stop Digoxin d/t elevated level and resume in 2 weeks at 0.0619m daily.  Digoxin 0.1233m-(1/2 tablet) #45 with 3 RF sent to pharmacy on file.  Pt verbalizes understanding and agrees with current plan.

## 2020-01-31 NOTE — Telephone Encounter (Signed)
The pt agreed to send missed transmission today.

## 2020-02-01 NOTE — Progress Notes (Signed)
EPIC Encounter for ICM Monitoring  Patient Name: Katherine Rosales is a 69 y.o. female Date: 02/01/2020 Primary Care Physican: Dion Body, MD Primary Cardiologist:Agbor-Etang Electrophysiologist: [name] 10/26/2021OfficeWeight: 211lbs   Transmission reviewed.  Cardioversion scheduled for 02/13/2020.  Per Dr Olin Pia 01/29/2020 OV note Atrial fibrillation triggered acute heart failure  Optivol thoracic impedancesuggesting possible fluid accumulationstarting 12/24/2019.  Prescribed:Furosemide 20 mgTake 1 tablet (20 mg total) by mouth daily. Take extra 1 extra tablet (20 mg) as needed for swelling or increased shortness of breath.  Labs: 01/28/2020 Creatinine 1.28, BUN 18, Potassium 4.0, Sodium 137, GFR 45 01/09/2019 Creatinine 1.21, BUN 25, Potassium 4.1, Sodium 137, GFR 46-53 A complete set of results can be found in Results Review.  Recommendations were given at 01/29/2020 OV with Dr [name]  Follow-up plan: ICM clinic phone appointment on11/06/2019 for recheck per Dr [name]. 91 day device clinic remote transmission11/11/2019.  EP/Cardiology Office Visits: 02/26/2020 with Marrianne Mood, NP.    Copy of ICM check sent to [place]   3 month ICM trend: 01/31/2020    1 Year ICM trend:       [name], RN 02/01/2020 11:55 AM

## 2020-02-02 ENCOUNTER — Other Ambulatory Visit: Payer: Self-pay | Admitting: Internal Medicine

## 2020-02-06 ENCOUNTER — Ambulatory Visit (INDEPENDENT_AMBULATORY_CARE_PROVIDER_SITE_OTHER): Payer: Medicare HMO

## 2020-02-06 DIAGNOSIS — I5022 Chronic systolic (congestive) heart failure: Secondary | ICD-10-CM

## 2020-02-06 DIAGNOSIS — Z95810 Presence of automatic (implantable) cardiac defibrillator: Secondary | ICD-10-CM

## 2020-02-06 NOTE — Progress Notes (Signed)
Kate Sable, MD  Javious Hallisey Panda, RN; Deboraha Sprang, MD Thanks for the update. Dr. [name] is both patient's primary cardiologist and EP. He will adjust medications as necessary. I was DOD/covering while Dr. [name] was away.   Thank you  BA

## 2020-02-06 NOTE — Progress Notes (Signed)
EPIC Encounter for ICM Monitoring  Patient Name: Katherine Rosales is a 69 y.o. female Date: 02/06/2020 Primary Care Physican: Dion Body, MD Primary [place] Electrophysiologist: [name] 10/26/2021OfficeWeight: 211lbs (does not weigh at home)   Spoke with patient and transmission reviewed.  She said she still does not feel very well.  Cardioversion scheduled for 02/13/2020.  Per Dr Olin Pia 01/29/2020 OV note Atrial fibrillation triggered acute heart failure (patient does not have a device atrial lead)  Optivol thoracic impedancesuggesting possible fluid accumulationstarting 12/24/2019.  Impedance worse starting 01/29/20 but starting to improve and trend back toward baseline on 02/05/2020.  Prescribed:Furosemide 40 mgTake 1 tablet (40 mg total) by mouth daily (increased by Dr Garen Lah on 10/25 OV).   Labs: 01/28/2020 Creatinine 1.28, BUN 18, Potassium 4.0, Sodium 137, GFR 45 01/09/2019 Creatinine 1.21, BUN 25, Potassium 4.1, Sodium 137, GFR 46-53 A complete set of results can be found in Results Review.  Recommendations were given at 01/29/2020 OV with Dr [name]  Follow-up plan: ICM clinic phone appointment on11/16/2021to recheck fluid levels. 91 day device clinic remote transmission11/11/2019.  EP/Cardiology Office Visits: 02/26/2020 with Marrianne Mood, NP.  Copy of ICM check sent to Dr.Klein and Dr Garen Lah for review and recommendations if needed.    3 month ICM trend: 02/06/2020    1 Year ICM trend:       [name], RN 02/06/2020 12:31 PM

## 2020-02-11 ENCOUNTER — Other Ambulatory Visit: Payer: Self-pay

## 2020-02-11 ENCOUNTER — Ambulatory Visit (INDEPENDENT_AMBULATORY_CARE_PROVIDER_SITE_OTHER): Payer: Medicare HMO

## 2020-02-11 ENCOUNTER — Other Ambulatory Visit
Admission: RE | Admit: 2020-02-11 | Discharge: 2020-02-11 | Disposition: A | Discharge status: Home / Self Care | Payer: Medicare HMO | Source: Ambulatory Visit | Attending: Internal Medicine | Admitting: Internal Medicine

## 2020-02-11 DIAGNOSIS — Z20822 Contact with and (suspected) exposure to covid-19: Secondary | ICD-10-CM | POA: Insufficient documentation

## 2020-02-11 DIAGNOSIS — Z01818 Encounter for other preprocedural examination: Secondary | ICD-10-CM | POA: Diagnosis not present

## 2020-02-11 DIAGNOSIS — I428 Other cardiomyopathies: Secondary | ICD-10-CM | POA: Diagnosis not present

## 2020-02-12 LAB — SARS CORONAVIRUS 2 (TAT 6-24 HRS): SARS Coronavirus 2: NEGATIVE

## 2020-02-13 ENCOUNTER — Encounter
Admission: RE | Disposition: A | Discharge status: Home / Self Care | Payer: Self-pay | Source: Home / Self Care | Attending: Cardiology

## 2020-02-13 ENCOUNTER — Ambulatory Visit: Payer: Medicare HMO | Admitting: Registered Nurse

## 2020-02-13 ENCOUNTER — Encounter: Payer: Self-pay | Admitting: Cardiology

## 2020-02-13 ENCOUNTER — Other Ambulatory Visit: Payer: Self-pay

## 2020-02-13 ENCOUNTER — Ambulatory Visit
Admission: RE | Admit: 2020-02-13 | Discharge: 2020-02-13 | Disposition: A | Discharge status: Home / Self Care | Payer: Medicare HMO | Attending: Cardiology | Admitting: Cardiology

## 2020-02-13 DIAGNOSIS — I4819 Other persistent atrial fibrillation: Secondary | ICD-10-CM | POA: Diagnosis not present

## 2020-02-13 DIAGNOSIS — G4730 Sleep apnea, unspecified: Secondary | ICD-10-CM | POA: Diagnosis not present

## 2020-02-13 DIAGNOSIS — I428 Other cardiomyopathies: Secondary | ICD-10-CM | POA: Diagnosis not present

## 2020-02-13 DIAGNOSIS — I110 Hypertensive heart disease with heart failure: Secondary | ICD-10-CM | POA: Diagnosis not present

## 2020-02-13 DIAGNOSIS — I4891 Unspecified atrial fibrillation: Secondary | ICD-10-CM | POA: Diagnosis not present

## 2020-02-13 DIAGNOSIS — I5022 Chronic systolic (congestive) heart failure: Secondary | ICD-10-CM | POA: Insufficient documentation

## 2020-02-13 DIAGNOSIS — Z79899 Other long term (current) drug therapy: Secondary | ICD-10-CM | POA: Diagnosis not present

## 2020-02-13 DIAGNOSIS — N183 Chronic kidney disease, stage 3 unspecified: Secondary | ICD-10-CM | POA: Diagnosis not present

## 2020-02-13 DIAGNOSIS — Z95810 Presence of automatic (implantable) cardiac defibrillator: Secondary | ICD-10-CM | POA: Insufficient documentation

## 2020-02-13 DIAGNOSIS — Z7901 Long term (current) use of anticoagulants: Secondary | ICD-10-CM | POA: Insufficient documentation

## 2020-02-13 DIAGNOSIS — E1122 Type 2 diabetes mellitus with diabetic chronic kidney disease: Secondary | ICD-10-CM | POA: Diagnosis not present

## 2020-02-13 DIAGNOSIS — Z886 Allergy status to analgesic agent status: Secondary | ICD-10-CM | POA: Diagnosis not present

## 2020-02-13 DIAGNOSIS — I130 Hypertensive heart and chronic kidney disease with heart failure and stage 1 through stage 4 chronic kidney disease, or unspecified chronic kidney disease: Secondary | ICD-10-CM | POA: Diagnosis not present

## 2020-02-13 HISTORY — PX: CARDIOVERSION: SHX1299

## 2020-02-13 LAB — CUP PACEART REMOTE DEVICE CHECK
Battery Remaining Longevity: 46 mo
Battery Voltage: 2.97 V
Brady Statistic RV Percent Paced: 0.12 %
Date Time Interrogation Session: 20211108012205
HighPow Impedance: 66 Ohm
Implantable Lead Implant Date: 20140813
Implantable Lead Location: 753860
Implantable Lead Model: 6935
Implantable Pulse Generator Implant Date: 20140813
Lead Channel Impedance Value: 342 Ohm
Lead Channel Impedance Value: 437 Ohm
Lead Channel Pacing Threshold Amplitude: 1 V
Lead Channel Pacing Threshold Pulse Width: 0.4 ms
Lead Channel Sensing Intrinsic Amplitude: 13.125 mV
Lead Channel Sensing Intrinsic Amplitude: 13.125 mV
Lead Channel Setting Pacing Amplitude: 2 V
Lead Channel Setting Pacing Pulse Width: 0.4 ms
Lead Channel Setting Sensing Sensitivity: 0.3 mV

## 2020-02-13 LAB — PROTIME-INR
INR: 1.6 — ABNORMAL HIGH (ref 0.8–1.2)
Prothrombin Time: 18.6 seconds — ABNORMAL HIGH (ref 11.4–15.2)

## 2020-02-13 SURGERY — CARDIOVERSION
Anesthesia: General

## 2020-02-13 MED ORDER — PROPOFOL 500 MG/50ML IV EMUL
INTRAVENOUS | Status: AC
  Filled 2020-02-13: qty 50

## 2020-02-13 MED ORDER — EPHEDRINE 5 MG/ML INJ
INTRAVENOUS | Status: AC
  Filled 2020-02-13: qty 10

## 2020-02-13 MED ORDER — SODIUM CHLORIDE 0.9 % IV SOLN: INTRAVENOUS | Status: DC | PRN

## 2020-02-13 MED ORDER — METOPROLOL TARTRATE 5 MG/5ML IV SOLN
INTRAVENOUS | Status: AC
  Filled 2020-02-13: qty 5

## 2020-02-13 MED ORDER — PROPOFOL 10 MG/ML IV BOLUS
INTRAVENOUS | Status: DC | PRN
  Administered 2020-02-13: 10 mg via INTRAVENOUS
  Administered 2020-02-13: 30 mg via INTRAVENOUS

## 2020-02-13 MED ORDER — EPINEPHRINE 1 MG/10ML IJ SOSY
PREFILLED_SYRINGE | INTRAMUSCULAR | Status: AC
  Filled 2020-02-13: qty 10

## 2020-02-13 MED ORDER — ATROPINE SULFATE 1 MG/10ML IJ SOSY
PREFILLED_SYRINGE | INTRAMUSCULAR | Status: AC
  Filled 2020-02-13: qty 10

## 2020-02-13 MED ORDER — ONDANSETRON HCL 4 MG/2ML IJ SOLN: 4.0000 mg | Freq: Once | INTRAMUSCULAR | Status: DC | PRN

## 2020-02-13 MED ORDER — PHENYLEPHRINE HCL (PRESSORS) 10 MG/ML IV SOLN
INTRAVENOUS | Status: AC
  Filled 2020-02-13: qty 1

## 2020-02-13 MED ORDER — LIDOCAINE HCL (PF) 2 % IJ SOLN
INTRAMUSCULAR | Status: AC
  Filled 2020-02-13: qty 5

## 2020-02-13 MED ORDER — ROCURONIUM BROMIDE 10 MG/ML (PF) SYRINGE
PREFILLED_SYRINGE | INTRAVENOUS | Status: AC
  Filled 2020-02-13: qty 10

## 2020-02-13 NOTE — Anesthesia Postprocedure Evaluation (Signed)
Anesthesia Post Note  Patient: Katherine Rosales  Procedure(s) Performed: CARDIOVERSION (N/A )  Patient location during evaluation: Specials Recovery Anesthesia Type: General Level of consciousness: awake and alert Pain management: pain level controlled Vital Signs Assessment: post-procedure vital signs reviewed and stable Respiratory status: spontaneous breathing, nonlabored ventilation, respiratory function stable and patient connected to nasal cannula oxygen Cardiovascular status: blood pressure returned to baseline and stable Postop Assessment: no apparent nausea or vomiting Anesthetic complications: no   No complications documented.   Last Vitals:  Vitals:   02/13/20 0746 02/13/20 0750  BP:  (!) 98/56  Pulse: 74 74  Resp: (!) 22 17  Temp:    SpO2: 93% 96%    Last Pain:  Vitals:   02/13/20 0704  TempSrc: Oral                 Arita Miss

## 2020-02-13 NOTE — Interval H&P Note (Signed)
History and Physical Interval Note:  02/13/2020 7:43 AM  Katherine Rosales  has presented today for cardioversion, with the diagnosis of Afib.  The various methods of treatment have been discussed with the patient and family. After consideration of risks, benefits and other options for treatment, the patient has consented to  Procedure(s): CARDIOVERSION (N/A) as a surgical intervention.  The patient's history has been reviewed, patient examined, no change in status, stable for surgery.  I have reviewed the patient's chart and labs.  Questions were answered to the patient's satisfaction.     Aaron Edelman Agbor-Etang

## 2020-02-13 NOTE — Procedures (Signed)
Cardioversion procedure note For atrial fibrillation.  Procedure Details:  Consent: Risks of procedure as well as the alternatives and risks of each were explained to the (patient/caregiver).  Consent for procedure obtained.  Time Out: Verified patient identification, verified procedure, site/side was marked, verified correct patient position, special equipment/implants available, medications/allergies/relevent history reviewed, required imaging and test results available.  Performed  Patient placed on cardiac monitor, pulse oximetry, supplemental oxygen as necessary.   Sedation given by anesthesia team Pacer pads placed anterior and posterior chest.   Cardioverted 1 time(s).   Cardioverted at  [place]. Synchronized biphasic Converted to NSR   Evaluation: Findings: Post procedure EKG shows: NSR Complications: None Patient did tolerate procedure well.  Time Spent Directly with the Patient:  35 minutes   Kate Sable, M.D.

## 2020-02-13 NOTE — Anesthesia Preprocedure Evaluation (Signed)
Anesthesia Evaluation  Patient identified by MRN, date of birth, ID band Patient awake    Reviewed: Allergy & Precautions, NPO status , Patient's Chart, lab work & pertinent test results  History of Anesthesia Complications Negative for: history of anesthetic complications  Airway Mallampati: II  TM Distance: >3 FB Neck ROM: Full    Dental no notable dental hx. (+) Teeth Intact   Pulmonary sleep apnea (Not using CPAP) , neg COPD, Not current smoker,    Pulmonary exam normal breath sounds clear to auscultation       Cardiovascular hypertension, Pt. on medications and Pt. on home beta blockers +CHF (LVEF 30%)  + Cardiac Defibrillator  Rhythm:Irregular Rate:Tachycardia - Systolic murmurs TTE 9381: 1. The left ventricle has moderate-severely reduced systolic function,  with an ejection fraction of 30-35%. The cavity size was normal. Left  ventricular diastolic Doppler parameters are consistent with impaired  relaxation. Left ventricular diffuse  hypokinesis.  2. The right ventricle has normal systolic function. The cavity was  normal. There is no increase in right ventricular wall thickness. Right  ventricular systolic pressure is mildly elevated with an estimated  pressure of 39.2 mmHg.  3. Left atrial size was moderately dilated.  4. Tricuspid valve regurgitation is mild-moderate.   Neuro/Psych neg Seizures PSYCHIATRIC DISORDERS Anxiety Depression    GI/Hepatic neg GERD  ,(+) Hepatitis - ([facility])  Endo/Other  diabetes, Type 2, Oral Hypoglycemic AgentsHypothyroidism Morbid obesity  Renal/GU Renal InsufficiencyRenal disease     Musculoskeletal   Abdominal (+) + obese,   Peds  Hematology  (+) anemia ,   Anesthesia Other Findings Past Medical History: No date: 6949 Lead-[employer]tronic 11/12/2016: Anemia 12/25/2013: Anxiety No date: Automatic implantable cardioverter-defibrillator in situ 03/1995: Breast cancer  [facility])     Comment:  right breast ca with lumpectomy and chemo and rad No date: Cardiomyopathy, nonischemic ([place]) 11/12/2016: Chronic kidney disease     Comment:  stage 3 No date: Depression No date: Diabetes mellitus     Comment:  Type II Diabetes No date: Hypertension No date: Hypothyroidism No date: ICD (implantable cardiac defibrillator), singleMedtronic No date: NASH (nonalcoholic steatohepatitis) 1996: Personal history of chemotherapy     Comment:  right breast ca 1996: Personal history of radiation therapy     Comment:  right breast ca No date: Sleep apnea     Comment:  On CPAP No date: Spleen enlarged No date: Systolic heart failure, chronic (HCC)   Reproductive/Obstetrics                             Anesthesia Physical  Anesthesia Plan  ASA: III  Anesthesia Plan: General   Post-op Pain Management:    Induction: Intravenous  PONV Risk Score and Plan: 3 and Ondansetron  Airway Management Planned: Natural Airway  Additional Equipment: None  Intra-op Plan:   Post-operative Plan:   Informed Consent: I have reviewed the patients History and Physical, chart, labs and discussed the procedure including the risks, benefits and alternatives for the proposed anesthesia with the patient or authorized representative who has indicated his/her understanding and acceptance.     Dental advisory given  Plan Discussed with: CRNA and Surgeon  Anesthesia Plan Comments: (Discussed risks of anesthesia with patient, including possibility of difficulty with spontaneous ventilation under anesthesia necessitating airway intervention, PONV, and rare risks such as cardiac or respiratory or neurological events. Patient understands.)        Anesthesia Quick Evaluation

## 2020-02-13 NOTE — Progress Notes (Signed)
Remote ICD transmission.   

## 2020-02-13 NOTE — Discharge Instructions (Signed)
Electrical Cardioversion Electrical cardioversion is the delivery of a jolt of electricity to restore a normal rhythm to the heart. A rhythm that is too fast or is not regular keeps the heart from pumping well. In this procedure, sticky patches or metal paddles are placed on the chest to deliver electricity to the heart from a device. This procedure may be done in an emergency if:  There is low or no blood pressure as a result of the heart rhythm.  Normal rhythm must be restored as fast as possible to protect the brain and heart from further damage.  It may save a life. This may also be a scheduled procedure for irregular or fast heart rhythms that are not immediately life-threatening. Tell a health care provider about:  Any allergies you have.  All medicines you are taking, including vitamins, herbs, eye drops, creams, and over-the-counter medicines.  Any problems you or family members have had with anesthetic medicines.  Any blood disorders you have.  Any surgeries you have had.  Any medical conditions you have.  Whether you are pregnant or may be pregnant. What are the risks? Generally, this is a safe procedure. However, problems may occur, including:  Allergic reactions to medicines.  A blood clot that breaks free and travels to other parts of your body.  The possible return of an abnormal heart rhythm within hours or days after the procedure.  Your heart stopping (cardiac arrest). This is rare. What happens before the procedure? Medicines  Your health care provider may have you start taking: ? Blood-thinning medicines (anticoagulants) so your blood does not clot as easily. ? Medicines to help stabilize your heart rate and rhythm.  Ask your health care provider about: ? Changing or stopping your regular medicines. This is especially important if you are taking diabetes medicines or blood thinners. ? Taking medicines such as aspirin and ibuprofen. These medicines can  thin your blood. Do not take these medicines unless your health care provider tells you to take them. ? Taking over-the-counter medicines, vitamins, herbs, and supplements. General instructions  Follow instructions from your health care provider about eating or drinking restrictions.  Plan to have someone take you home from the hospital or clinic.  If you will be going home right after the procedure, plan to have someone with you for 24 hours.  Ask your health care provider what steps will be taken to help prevent infection. These may include washing your skin with a germ-killing soap. What happens during the procedure?   An IV will be inserted into one of your veins.  Sticky patches (electrodes) or metal paddles may be placed on your chest.  You will be given a medicine to help you relax (sedative).  An electrical shock will be delivered. The procedure may vary among health care providers and hospitals. What can I expect after the procedure?  Your blood pressure, heart rate, breathing rate, and blood oxygen level will be monitored until you leave the hospital or clinic.  Your heart rhythm will be watched to make sure it does not change.  You may have some redness on the skin where the shocks were given. Follow these instructions at home:  Do not drive for 24 hours if you were given a sedative during your procedure.  Take over-the-counter and prescription medicines only as told by your health care provider.  Ask your health care provider how to check your pulse. Check it often.  Rest for 48 hours after the procedure or   as told by your health care provider.  Avoid or limit your caffeine use as told by your health care provider.  Keep all follow-up visits as told by your health care provider. This is important. Contact a health care provider if:  You feel like your heart is beating too quickly or your pulse is not regular.  You have a serious muscle cramp that does not go  away. Get help right away if:  You have discomfort in your chest.  You are dizzy or you feel faint.  You have trouble breathing or you are short of breath.  Your speech is slurred.  You have trouble moving an arm or leg on one side of your body.  Your fingers or toes turn cold or blue. Summary  Electrical cardioversion is the delivery of a jolt of electricity to restore a normal rhythm to the heart.  This procedure may be done right away in an emergency or may be a scheduled procedure if the condition is not an emergency.  Generally, this is a safe procedure.  After the procedure, check your pulse often as told by your health care provider. This information is not intended to replace advice given to you by your health care provider. Make sure you discuss any questions you have with your health care provider. Document Revised: 10/23/2018 Document Reviewed: 10/23/2018 Elsevier Patient Education  2020 [employer]  

## 2020-02-13 NOTE — Transfer of Care (Signed)
Immediate Anesthesia Transfer of Care Note  Patient: Katherine Rosales  Procedure(s) Performed: CARDIOVERSION (N/A )  Patient Location: PACU [facility]  Anesthesia Type:General  Level of Consciousness: drowsy  Airway & Oxygen Therapy: Patient Spontanous Breathing and Patient connected to nasal cannula oxygen  Post-op Assessment: Report given to RN and Post -op Vital signs reviewed and stable  Post vital signs: Reviewed and stable  Last Vitals:  Vitals Value Taken Time  BP 103/66 02/13/20 0745  Temp    Pulse 74 02/13/20 0746  Resp 22 02/13/20 0746  SpO2 93 % 02/13/20 0746    Last Pain:  Vitals:   02/13/20 0704  TempSrc: Oral         Complications: No complications documented.

## 2020-02-15 ENCOUNTER — Telehealth: Payer: Self-pay

## 2020-02-15 ENCOUNTER — Other Ambulatory Visit: Payer: Self-pay

## 2020-02-15 ENCOUNTER — Ambulatory Visit (INDEPENDENT_AMBULATORY_CARE_PROVIDER_SITE_OTHER): Payer: Medicare HMO | Admitting: Cardiology

## 2020-02-15 ENCOUNTER — Encounter: Payer: Self-pay | Admitting: Cardiology

## 2020-02-15 VITALS — BP 110/68 | HR 81 | Ht 59.0 in | Wt 213.2 lb

## 2020-02-15 DIAGNOSIS — I428 Other cardiomyopathies: Secondary | ICD-10-CM

## 2020-02-15 DIAGNOSIS — I480 Paroxysmal atrial fibrillation: Secondary | ICD-10-CM | POA: Diagnosis not present

## 2020-02-15 MED ORDER — TORSEMIDE 20 MG PO TABS: 40.0000 mg | ORAL_TABLET | Freq: Every day | ORAL | 3 refills | Status: DC

## 2020-02-15 NOTE — Telephone Encounter (Signed)
Returned patient call as requested by voice mail message.   She reports her breathing is worse since the cardoversion was done on 11/10.  She asked if I was able to tell if she is in afib and advised she does not have an atrial lead and unable to tell if she has afib.  Advised she may still be retaining fluid that could cause the shortness of breath.  Message send to Dr Olin Pia nurse, Alvis Lemmings and Dr Garen Lah has an office opening today at 3:20 PM.  Patient said she will take the appointment and advised to be there 15 minutes prior to the appointment time.  Advised to send remote transmission now to recheck fluid levels.  Transmission reviewed.  Advised to limit fluid intake to 64 oz daily and salt intake to <2000 mg daily.  Advised to read food labels for sodium amounts.    Note routed to Alvis Lemmings, RN and she will review with Dr Garen Lah at patient's appointment today.   02/15/2020 [employer] suggesting fluid levels are worse than last remote transmission which was on 02/11/2020.

## 2020-02-15 NOTE — Patient Instructions (Signed)
Medication Instructions:   Your physician recommends that you return for lab work in:   1) STOP Lasix (Furosemide)  2) START Torsemide 17m ONCE DAILY   *If you need a refill on your cardiac medications before your next appointment, please call your pharmacy*   Lab Work: Your physician recommends that you return for lab work in: 5 days (Wednesday 02/20/20) B[facility]  -  [facility] You will check in at the front desk to the right as you walk into the atrium. Valet Parking is offered if needed. - No appointment needed. You may go any day between 7 am and 6 pm.   If you have labs (blood work) drawn today and your tests are completely normal, you will receive your results only by: .Marland KitchenMyChart Message (if you have MyChart) OR . A paper copy in the mail If you have any lab test that is abnormal or we need to change your treatment, we will call you to review the results.   Testing/Procedures: None ordered   Follow-Up: [facility] and your [facility] needs are our [facility].  As part of our continuing mission to provide you with exceptional heart care, we have created designated Provider Care Teams.  These Care Teams include your primary Cardiologist (physician) and Advanced Practice Providers (APPs -  Physician Assistants and Nurse Practitioners) who all work together to provide you with the care you need, when you need it.  We recommend signing up for the patient portal called "MyChart".  Sign up information is provided on this After Visit Summary.  MyChart is used to connect with patients for Virtual Visits (Telemedicine).  Patients are able to view lab/test results, encounter notes, upcoming appointments, etc.  Non-urgent messages can be sent to your provider as well.   To learn more about what you can do with MyChart, go to [email or web address]    Your next appointment:   Thursday 02/21/20  The format for your next appointment:   In Person  Provider:    You will see one of the following Advanced Practice Providers on your designated Care Team:    CMurray Hodgkins NP  RChristell Faith PA-C  JMarrianne Mood PA-C  Cadence FHebron PVermont CLaurann Montana NP

## 2020-02-15 NOTE — Progress Notes (Signed)
Cardiology Office Note:    Date:  02/15/2020   ID:  [name], DOB [birth year], MRN [id number]  PCP:  Dion Body, MD  [facility] Cardiologist:  [name], MD  [place] Electrophysiologist:  None   Referring MD: Dion Body, MD   Chief Complaint  Patient presents with  . other    Pt. c/o shortness of breath, cough and has some pain in mid-sternum since the DCCV. Meds reviewed by the tp. verbally.     History of Present Illness:    Katherine Rosales is a 69 y.o. female with a hx of NICM s/p ICD, last EF 30-35%, persistent A. Fib s/p DCCV 02/13/2020, hypertension, diabetes, CKD stage III, OSA who presents due to shortness of breath.  She was initially seen for A. fib RVR. Started on Eliquis and Lopressor. CHF medications were held due to low blood pressures. Underwent DC cardioversion on 02/13/2020 successfully. Since her cardioversion 2 days ago, symptoms of shortness of breath have worsened, with OptiVol trending upwards . Currently takes Lasix 40 mg daily, Lopressor 50 mg twice daily, lisinopril 5 mg daily.  Dig levels were elevated at 2.3.  She was advised to hold digoxin.  Prior notes device check on 11/12/2019 showed normal device functioning.  echocardiogram 12/08/2018 showed moderate to severely reduced ejection fraction, EF 30 to 35%.  Patient not sure how long she has been in atrial fibrillation.  States having low blood pressures at home with systolic in the 91Y, lisinopril was decreased to 10 mg.  On follow-up with PCP yesterday, lisinopril stopped due to systolics in the 78G.  She is supposed to be on a CPAP for OSA but has not been compliant.  Past Medical History:  Diagnosis Date  . 9562 Lead-Medtronic   . Anemia 11/12/2016  . Anxiety 12/25/2013  . Automatic implantable cardioverter-defibrillator in situ   . Breast cancer ([place]) 03/1995   right breast ca with lumpectomy and chemo and rad  . Cardiomyopathy, nonischemic ([place])   . Chronic  kidney disease 11/12/2016   stage 3  . Depression   . Diabetes mellitus    Type II Diabetes  . Hypertension   . Hypothyroidism   . ICD (implantable cardiac defibrillator), singleMedtronic   . NASH (nonalcoholic steatohepatitis)   . Personal history of chemotherapy 1996   right breast ca  . Personal history of radiation therapy 1996   right breast ca  . Sleep apnea    On CPAP  . Spleen enlarged   . Systolic heart failure, chronic ([place])     Past Surgical History:  Procedure Laterality Date  . ABDOMINAL HYSTERECTOMY    . BREAST BIOPSY Right 11/26/2015   - FAT NECROSIS, FIBROSIS, AND CHRONIC INFLAMMATION.   Marland Kitchen BREAST BIOPSY Right 12/20/2018   Affirm Bx- X-clip- path pending  . BREAST BIOPSY WITH RADIO FREQUENCY LOCALIZER Right 10/18/2019   Procedure: RF TAG GUIDED EXCISIONAL BREAST BIOPSY;  Surgeon: Benjamine Sprague, DO;  Location: [facility] ORS;  Service: General;  Laterality: Right;  . BREAST EXCISIONAL BIOPSY Right 03/1995   + breast ca chemo and rad. 9:00 region  . BREAST EXCISIONAL BIOPSY Right 2011   surgical bx neg  11:00 region  . BREAST LUMPECTOMY Right 1996  . CARDIOVERSION N/A 02/13/2020   Procedure: CARDIOVERSION;  Surgeon: Kate Sable, MD;  Location: [facility] ORS;  Service: Cardiovascular;  Laterality: N/A;  . CHOLECYSTECTOMY    . COLONOSCOPY  03/05/2013  . COLONOSCOPY W/ POLYPECTOMY  03/05/2013  . COLONOSCOPY WITH PROPOFOL  N/A 10/03/2017   Procedure: COLONOSCOPY WITH PROPOFOL;  Surgeon: Manya Silvas, MD;  Location: [facility] ENDOSCOPY;  Service: Endoscopy;  Laterality: N/A;  . ESOPHAGOGASTRODUODENOSCOPY  01/11/2003, 06/25/2015  . ESOPHAGOGASTRODUODENOSCOPY (EGD) WITH PROPOFOL N/A 06/25/2015   Procedure: ESOPHAGOGASTRODUODENOSCOPY (EGD) WITH PROPOFOL;  Surgeon: Manya Silvas, MD;  Location: [facility] ENDOSCOPY;  Service: Endoscopy;  Laterality: N/A;  . FOOT SURGERY Right   . IMPLANTABLE CARDIOVERTER DEFIBRILLATOR (ICD) GENERATOR CHANGE N/A 11/15/2012   Procedure: ICD GENERATOR  CHANGE;  Surgeon: Deboraha Sprang, MD;  Location: [facility] CATH LAB;  Service: Cardiovascular;  Laterality: N/A;  . JOINT REPLACEMENT  2009   Total Knee Replacement, Left  . LEAD REVISION N/A 11/15/2012   Procedure: LEAD REVISION;  Surgeon: Deboraha Sprang, MD;  Location: [facility] CATH LAB;  Service: Cardiovascular;  Laterality: N/A;  . PACEMAKER INSERTION  06/30/2005   Medtronic Virtuoso VR ICD - Nonischemic cardiomyopathy potentially related to adriamycin, class 3 congestive failture with a narrow QRS and a negative TDI  . TONSILLECTOMY    . TUBAL LIGATION      Current Medications: Current Meds  Medication Sig  . apixaban (ELIQUIS) 5 MG TABS tablet Take 1 tablet (5 mg total) by mouth 2 (two) times daily.  . Calcium Carbonate-Vitamin D (CALTRATE 600+D) 600-400 MG-UNIT per tablet Take 1 tablet by mouth 2 (two) times daily.    Marland Kitchen docusate sodium (COLACE) 100 MG capsule Take 300 mg by mouth at bedtime.   . DULoxetine (CYMBALTA) 30 MG capsule Take 30 mg by mouth daily.  Marland Kitchen gabapentin (NEURONTIN) 600 MG tablet Take 600 mg by mouth 2 (two) times daily.  Marland Kitchen glimepiride (AMARYL) 4 MG tablet Take 4 mg by mouth 2 (two) times daily before lunch and supper.   . levothyroxine (SYNTHROID, LEVOTHROID) 100 MCG tablet Take 100 mcg by mouth daily.   Marland Kitchen lisinopril (ZESTRIL) 10 MG tablet Take 0.5 tablets (5 mg total) by mouth daily.  . metoprolol tartrate (LOPRESSOR) 50 MG tablet Take 1 tablet (50 mg total) by mouth 2 (two) times daily.  . Multiple Vitamin (MULTIVITAMIN WITH MINERALS) TABS tablet Take 1 tablet by mouth daily. Centrum Silver  . Multiple Vitamins-Minerals (EYE-VITE EXTRA PLUS LUTEIN PO) Take 1 tablet by mouth daily.  Marland Kitchen omeprazole (PRILOSEC) 20 MG capsule Take 20 mg by mouth daily.    . Semaglutide,0.25 or 0.5MG/DOS, (OZEMPIC, 0.25 OR 0.5 MG/DOSE,) 2 MG/1.5ML SOPN Inject 0.5 mg into the skin every Friday.   . [DISCONTINUED] furosemide (LASIX) 40 MG tablet Take 1 tablet (40 mg total) by mouth daily.      Allergies:   Ibuprofen   Social History   Socioeconomic History  . Marital status: Widowed    Spouse name: Not on file  . Number of children: Not on file  . Years of education: Not on file  . Highest education level: Not on file  Occupational History  . Occupation: Full time    Employer: [employer]  Tobacco Use  . Smoking status: Never Smoker  . Smokeless tobacco: Never Used  Vaping Use  . Vaping Use: Never used  Substance and Sexual Activity  . Alcohol use: No  . Drug use: No  . Sexual activity: Not on file  Other Topics Concern  . Not on file  Social History Narrative   Married   Does not get regular exercise   Social Determinants of Health   Financial Resource Strain:   . Difficulty of Paying Living Expenses: Not on file  Food Insecurity:   .  Worried About Charity fundraiser in the Last Year: Not on file  . Ran Out of Food in the Last Year: Not on file  Transportation Needs:   . Lack of Transportation (Medical): Not on file  . Lack of Transportation (Non-Medical): Not on file  Physical Activity:   . Days of Exercise per Week: Not on file  . Minutes of Exercise per Session: Not on file  Stress:   . Feeling of Stress : Not on file  Social Connections:   . Frequency of Communication with Friends and Family: Not on file  . Frequency of Social Gatherings with Friends and Family: Not on file  . Attends Religious Services: Not on file  . Active Member of Clubs or Organizations: Not on file  . Attends Archivist Meetings: Not on file  . Marital Status: Not on file     Family History: The patient's family history includes Prostate cancer in her father. There is no history of Breast cancer.  ROS:   Please see the history of present illness.     All other systems reviewed and are negative.  EKGs/Labs/Other Studies Reviewed:    The following studies were reviewed today:   EKG:  EKG is  ordered today.  The ekg ordered today demonstrates sinus  rhythm, frequent PACs  Recent Labs: 01/28/2020: BUN 18; Creatinine, Ser 1.28; Potassium 4.0; Sodium 137 01/29/2020: Hemoglobin 12.5; Platelets 150  Recent Lipid Panel No results found for: CHOL, TRIG, HDL, CHOLHDL, VLDL, LDLCALC, LDLDIRECT   Risk Assessment/Calculations:     CHA2DS2-VASc Score = 5  This indicates a 7.2% annual risk of stroke. The patient's score is based upon: CHF History: 1 HTN History: 1 Diabetes History: 1 Stroke History: 0 Vascular Disease History: 0 Age Score: 1 Gender Score: 1     Physical Exam:    VS:  BP 110/68 (BP Location: Left Arm, Patient Position: Sitting, Cuff Size: Large)   Pulse 81   Ht 4' 11"  (1.499 m)   Wt 213 lb 4 oz (96.7 kg)   SpO2 96%   BMI 43.07 kg/m     Wt Readings from Last 3 Encounters:  02/15/20 213 lb 4 oz (96.7 kg)  02/13/20 212 lb (96.2 kg)  01/29/20 211 lb (95.7 kg)     GEN:  Well nourished, well developed in no acute distress HEENT: Normal NECK: No JVD; No carotid bruits LYMPHATICS: No lymphadenopathy CARDIAC: Regular rate and rhythm, no murmurs RESPIRATORY: Decreased breath sounds at bases ABDOMEN: Soft, non-tender, non-distended MUSCULOSKELETAL:  No edema; No deformity  SKIN: Warm and dry NEUROLOGIC:  Alert and oriented x 3 PSYCHIATRIC:  Normal affect   ASSESSMENT:    1. Paroxysmal atrial fibrillation (HCC)   2. NICM (nonischemic cardiomyopathy) ([place])    PLAN:    In order of problems listed above:  1. Patient with persistent atrial fibrillation., CHA2DS2-VASc of 5.  Status post DC cardioversion, currently in sinus rhythm.  Heart rate controlled.  Continue Lopressor 50 mg twice daily, Eliquis 5 mg twice daily.  Continue to hold digoxin.   2. History of nonischemic cardiomyopathy, Lopressor as above.  OptiVol increase, stop Lasix, start torsemide 40 mg daily for better absorption and hopefully diuresing.  Check BMP in 5 days.  Follow-up next week.  See the up titration of diuretics as needed.  If BP  decreases, consider holding lisinopril while diuresing.  Continue Lopressor for now, plan to switch to GDMT Coreg/Toprol-XL after adequately diuresed.  Follow-up next week  Medication Adjustments/Labs and Tests Ordered: Current medicines are reviewed at length with the patient today.  Concerns regarding medicines are outlined above.  Orders Placed This Encounter  Procedures  . Basic Metabolic Panel (BMET)  . EKG 12-Lead   Meds ordered this encounter  Medications  . torsemide (DEMADEX) 20 MG tablet    Sig: Take 2 tablets (40 mg total) by mouth daily.    Dispense:  180 tablet    Refill:  3    Patient Instructions  Medication Instructions:   Your physician recommends that you return for lab work in:   1) STOP Lasix (Furosemide)  2) START Torsemide 77m ONCE DAILY   *If you need a refill on your cardiac medications before your next appointment, please call your pharmacy*   Lab Work: Your physician recommends that you return for lab work in: 5 days (Wednesday 02/20/20) Bmet  -  [facility] You will check in at the front desk to the right as you walk into the atrium. Valet Parking is offered if needed. - No appointment needed. You may go any day between 7 am and 6 pm.   If you have labs (blood work) drawn today and your tests are completely normal, you will receive your results only by: .Marland KitchenMyChart Message (if you have MyChart) OR . A paper copy in the mail If you have any lab test that is abnormal or we need to change your treatment, we will call you to review the results.   Testing/Procedures: None ordered   Follow-Up: [facility] and [facility] our [facility].  As part of our continuing mission to provide you with exceptional heart care, we have created designated Provider Care Teams.  These Care Teams include your primary Cardiologist (physician) and Advanced Practice Providers (APPs -  Physician Assistants and Nurse  Practitioners) who all work together to provide you with the care you need, when you need it.  We recommend signing up for the patient portal called "MyChart".  Sign up information is provided on this After Visit Summary.  MyChart is used to connect with patients for Virtual Visits (Telemedicine).  Patients are able to view lab/test results, encounter notes, upcoming appointments, etc.  Non-urgent messages can be sent to your provider as well.   To learn more about what you can do with MyChart, go to [email or web address]    Your next appointment:   Thursday 02/21/20  The format for your next appointment:   In Person  Provider:   You will see one of the following Advanced Practice Providers on your designated Care Team:    CMurray Hodgkins NP  RChristell Faith PA-C  JMarrianne Mood PA-C  [place] F[place] CLaurann [place] NP       Signed, BKate Sable MD  02/15/2020 4:05 PM    [place]

## 2020-02-18 ENCOUNTER — Telehealth: Payer: Self-pay | Admitting: Cardiology

## 2020-02-18 NOTE — Telephone Encounter (Signed)
Patient calling with update  States that she is feeling a lot better  She lost a lot of fluid over the weekend Call if needed

## 2020-02-19 ENCOUNTER — Ambulatory Visit (INDEPENDENT_AMBULATORY_CARE_PROVIDER_SITE_OTHER): Payer: Medicare HMO

## 2020-02-19 DIAGNOSIS — I5022 Chronic systolic (congestive) heart failure: Secondary | ICD-10-CM

## 2020-02-19 DIAGNOSIS — Z95810 Presence of automatic (implantable) cardiac defibrillator: Secondary | ICD-10-CM

## 2020-02-19 NOTE — Progress Notes (Signed)
EPIC Encounter for ICM Monitoring  Patient Name: Katherine Rosales is a 69 y.o. female Date: 02/19/2020 Primary Care Physican: Katherine Body, MD Primary [place] Electrophysiologist: Katherine Rosales 11/16/2021Weight: 204lbs    Spoke with patient and transmission reviewed.  She reported she lost 10 lbs of fluid weight since starting on Torsemide.    Optivol thoracic impedancesuggesting possible fluid accumulationstarting 12/24/2019 but significant improvement since starting on Torsemide.   Prescribed:Torsemide 20 mgTake 2 tablets (40 mg total) by mouth daily.   Labs: 01/28/2020 Creatinine 1.28, BUN 18, Potassium 4.0, Sodium 137, GFR 45 01/09/2019 Creatinine 1.21, BUN 25, Potassium 4.1, Sodium 137, GFR 46-53 A complete set of results can be found in Results Review.  Recommendations.  She has appointment with Katherine Mood, NP on 02/21/2020. Carelink report scheduled for 02/21/2020 and message sent to Dr Katherine Rosales nurse, Katherine Rosales to have device nurse print report on 11/18 to provide Katherine Rosales updated Optivol reading.   Follow-up plan: ICM clinic phone appointment on12/09/2019. 91 day device clinic remote transmission11/11/2019.  EP/Cardiology Office Visits: 02/21/2020 with Katherine Mood, NP.  Copy of ICM check sent to [place].    3 month ICM trend: 02/19/2020    1 Year ICM trend:       Katherine Billings, RN 02/19/2020 3:13 PM

## 2020-02-20 ENCOUNTER — Other Ambulatory Visit
Admission: RE | Admit: 2020-02-20 | Discharge: 2020-02-20 | Disposition: A | Discharge status: Home / Self Care | Payer: Medicare HMO | Source: Ambulatory Visit | Attending: Cardiology | Admitting: Cardiology

## 2020-02-20 DIAGNOSIS — I428 Other cardiomyopathies: Secondary | ICD-10-CM | POA: Insufficient documentation

## 2020-02-20 LAB — BASIC METABOLIC PANEL
Anion gap: 13 (ref 5–15)
BUN: 20 mg/dL (ref 8–23)
CO2: 31 mmol/L (ref 22–32)
Calcium: 10.1 mg/dL (ref 8.9–10.3)
Chloride: 95 mmol/L — ABNORMAL LOW (ref 98–111)
Creatinine, Ser: 1.49 mg/dL — ABNORMAL HIGH (ref 0.44–1.00)
GFR, Estimated: 38 mL/min — ABNORMAL LOW (ref >60)
Glucose, Bld: 175 mg/dL — ABNORMAL HIGH (ref 70–99)
Potassium: 3.6 mmol/L (ref 3.5–5.1)
Sodium: 139 mmol/L (ref 135–145)

## 2020-02-21 ENCOUNTER — Telehealth: Payer: Self-pay | Admitting: Emergency Medicine

## 2020-02-21 ENCOUNTER — Other Ambulatory Visit: Payer: Self-pay

## 2020-02-21 ENCOUNTER — Ambulatory Visit: Payer: Medicare HMO | Admitting: Cardiology

## 2020-02-21 ENCOUNTER — Ambulatory Visit: Payer: Medicare HMO | Admitting: Physician Assistant

## 2020-02-21 ENCOUNTER — Encounter: Payer: Self-pay | Admitting: Cardiology

## 2020-02-21 ENCOUNTER — Telehealth: Payer: Self-pay | Admitting: Cardiology

## 2020-02-21 VITALS — BP 118/70 | HR 80 | Ht 59.0 in | Wt 202.2 lb

## 2020-02-21 DIAGNOSIS — Z95810 Presence of automatic (implantable) cardiac defibrillator: Secondary | ICD-10-CM | POA: Diagnosis not present

## 2020-02-21 DIAGNOSIS — I428 Other cardiomyopathies: Secondary | ICD-10-CM

## 2020-02-21 DIAGNOSIS — I480 Paroxysmal atrial fibrillation: Secondary | ICD-10-CM | POA: Diagnosis not present

## 2020-02-21 MED ORDER — METOPROLOL SUCCINATE ER 50 MG PO TB24: 50.0000 mg | ORAL_TABLET | Freq: Every day | ORAL | 5 refills | Status: DC

## 2020-02-21 MED ORDER — TORSEMIDE 20 MG PO TABS: 20.0000 mg | ORAL_TABLET | Freq: Every day | ORAL | 3 refills | Status: DC

## 2020-02-21 NOTE — Telephone Encounter (Signed)
Patient returned call and is now scheduled for 1 pm with Dr.Agbor

## 2020-02-21 NOTE — Progress Notes (Signed)
Cardiology Office Note:    Date:  02/21/2020   ID:  Katherine Rosales, DOB 03/22/1951, MRN [id number]  PCP:  Dion Body, MD  [facility] Cardiologist:  [name], MD  [place] Electrophysiologist:  None   Referring MD: Dion Body, MD   Chief Complaint  Patient presents with  . OTHER    1 wk f/u no complaints today. Meds reviewed verbally with pt.    History of Present Illness:    Katherine Rosales is a 69 y.o. female with a hx of NICM s/p ICD, last EF 30-35%, persistent A. Fib s/p DCCV 02/13/2020, hypertension, diabetes, CKD stage III, OSA who presents for follow-up.  Last seen due to shortness of breath and volume overload.  Lasix was stopped, torsemide 40 mg daily started.  She states symptoms of shortness of breath and overall sense of wellbeing has improved.  OptiVol numbers also improving.  Had a BMP yesterday with creatinine slightly above baseline.   Prior notes device check on 11/12/2019 showed normal device functioning.  echocardiogram 12/08/2018 showed moderate to severely reduced ejection fraction, EF 30 to 35%.  Patient not sure how long she has been in atrial fibrillation.  States having low blood pressures at home with systolic in the 32I, lisinopril was decreased to 10 mg.  On follow-up with PCP yesterday, lisinopril stopped due to systolics in the 71I.  She is supposed to be on a CPAP for OSA but has not been compliant.  Past Medical History:  Diagnosis Date  . 4580 Lead-Medtronic   . Anemia 11/12/2016  . Anxiety 12/25/2013  . Automatic implantable cardioverter-defibrillator in situ   . Breast cancer ([place]) 03/1995   right breast ca with lumpectomy and chemo and rad  . Cardiomyopathy, nonischemic ([place])   . Chronic kidney disease 11/12/2016   stage 3  . Depression   . Diabetes mellitus    Type II Diabetes  . Hypertension   . Hypothyroidism   . ICD (implantable cardiac defibrillator), singleMedtronic   . NASH (nonalcoholic  steatohepatitis)   . Personal history of chemotherapy 1996   right breast ca  . Personal history of radiation therapy 1996   right breast ca  . Sleep apnea    On CPAP  . Spleen enlarged   . Systolic heart failure, chronic ([place])     Past Surgical History:  Procedure Laterality Date  . ABDOMINAL HYSTERECTOMY    . BREAST BIOPSY Right 11/26/2015   - FAT NECROSIS, FIBROSIS, AND CHRONIC INFLAMMATION.   Marland Kitchen BREAST BIOPSY Right 12/20/2018   Affirm Bx- X-clip- path pending  . BREAST BIOPSY WITH RADIO FREQUENCY LOCALIZER Right 10/18/2019   Procedure: RF TAG GUIDED EXCISIONAL BREAST BIOPSY;  Surgeon: Benjamine Sprague, DO;  Location: [facility] ORS;  Service: General;  Laterality: Right;  . BREAST EXCISIONAL BIOPSY Right 03/1995   + breast ca chemo and rad. 9:00 region  . BREAST EXCISIONAL BIOPSY Right 2011   surgical bx neg  11:00 region  . BREAST LUMPECTOMY Right 1996  . CARDIOVERSION N/A 02/13/2020   Procedure: CARDIOVERSION;  Surgeon: Kate Sable, MD;  Location: [facility] ORS;  Service: Cardiovascular;  Laterality: N/A;  . CHOLECYSTECTOMY    . COLONOSCOPY  03/05/2013  . COLONOSCOPY W/ POLYPECTOMY  03/05/2013  . COLONOSCOPY WITH PROPOFOL N/A 10/03/2017   Procedure: COLONOSCOPY WITH PROPOFOL;  Surgeon: Manya Silvas, MD;  Location: [facility] ENDOSCOPY;  Service: Endoscopy;  Laterality: N/A;  . ESOPHAGOGASTRODUODENOSCOPY  01/11/2003, 06/25/2015  . ESOPHAGOGASTRODUODENOSCOPY (EGD) WITH PROPOFOL N/A 06/25/2015  Procedure: ESOPHAGOGASTRODUODENOSCOPY (EGD) WITH PROPOFOL;  Surgeon: Manya Silvas, MD;  Location: [facility] ENDOSCOPY;  Service: Endoscopy;  Laterality: N/A;  . FOOT SURGERY Right   . IMPLANTABLE CARDIOVERTER DEFIBRILLATOR (ICD) GENERATOR CHANGE N/A 11/15/2012   Procedure: ICD GENERATOR CHANGE;  Surgeon: Deboraha Sprang, MD;  Location: [facility] CATH LAB;  Service: Cardiovascular;  Laterality: N/A;  . JOINT REPLACEMENT  2009   Total Knee Replacement, Left  . LEAD REVISION N/A 11/15/2012   Procedure: LEAD  REVISION;  Surgeon: Deboraha Sprang, MD;  Location: [facility] C[facility] LAB;  Service: Cardiovascular;  Laterality: N/A;  . PACEMAKER INSERTION  06/30/2005   Medtronic Virtuoso VR ICD - Nonischemic cardiomyopathy potentially related to adriamycin, class 3 congestive failture with a narrow QRS and a negative TDI  . TONSILLECTOMY    . TUBAL LIGATION      Current Medications: Current Meds  Medication Sig  . apixaban (ELIQUIS) 5 MG TABS tablet Take 1 tablet (5 mg total) by mouth 2 (two) times daily.  . Calcium Carbonate-Vitamin D (CALTRATE 600+D) 600-400 MG-UNIT per tablet Take 1 tablet by mouth 2 (two) times daily.    Marland Kitchen docusate sodium (COLACE) 100 MG capsule Take 300 mg by mouth at bedtime.   . DULoxetine (CYMBALTA) 30 MG capsule Take 30 mg by mouth daily.  Marland Kitchen gabapentin (NEURONTIN) 600 MG tablet Take 600 mg by mouth 2 (two) times daily.  Marland Kitchen glimepiride (AMARYL) 4 MG tablet Take 4 mg by mouth 2 (two) times daily before lunch and supper.   . levothyroxine (SYNTHROID, LEVOTHROID) 100 MCG tablet Take 100 mcg by mouth daily.   Marland Kitchen lisinopril (ZESTRIL) 10 MG tablet Take 0.5 tablets (5 mg total) by mouth daily.  . Multiple Vitamin (MULTIVITAMIN WITH MINERALS) TABS tablet Take 1 tablet by mouth daily. Centrum Silver  . Multiple Vitamins-Minerals (EYE-VITE EXTRA PLUS LUTEIN PO) Take 1 tablet by mouth daily.  Marland Kitchen omeprazole (PRILOSEC) 20 MG capsule Take 20 mg by mouth daily.    . Semaglutide,0.25 or 0.5MG/DOS, (OZEMPIC, 0.25 OR 0.5 MG/DOSE,) 2 MG/1.5ML SOPN Inject 0.5 mg into the skin every Friday.   . torsemide (DEMADEX) 20 MG tablet Take 1 tablet (20 mg total) by mouth daily.  . [DISCONTINUED] metoprolol tartrate (LOPRESSOR) 50 MG tablet Take 1 tablet (50 mg total) by mouth 2 (two) times daily.  . [DISCONTINUED] torsemide (DEMADEX) 20 MG tablet Take 2 tablets (40 mg total) by mouth daily.     Allergies:   Ibuprofen   Social History   Socioeconomic History  . Marital status: Widowed    Spouse name: Not on file   . Number of children: Not on file  . Years of education: Not on file  . Highest education level: Not on file  Occupational History  . Occupation: Full time    Employer: [employer]  Tobacco Use  . Smoking status: Never Smoker  . Smokeless tobacco: Never Used  Vaping Use  . Vaping Use: Never used  Substance and Sexual Activity  . Alcohol use: No  . Drug use: No  . Sexual activity: Not on file  Other Topics Concern  . Not on file  Social History Narrative   Married   Does not get regular exercise   Social Determinants of Health   Financial Resource Strain:   . Difficulty of Paying Living Expenses: Not on file  Food Insecurity:   . Worried About Charity fundraiser in the Last Year: Not on file  . Ran Out of Food in the  Last Year: Not on file  Transportation Needs:   . Lack of Transportation (Medical): Not on file  . Lack of Transportation (Non-Medical): Not on file  Physical Activity:   . Days of Exercise per Week: Not on file  . Minutes of Exercise per Session: Not on file  Stress:   . Feeling of Stress : Not on file  Social Connections:   . Frequency of Communication with Friends and Family: Not on file  . Frequency of Social Gatherings with Friends and Family: Not on file  . Attends Religious Services: Not on file  . Active Member of Clubs or Organizations: Not on file  . Attends Archivist Meetings: Not on file  . Marital Status: Not on file     Family History: The patient's family history includes Prostate cancer in her father. There is no history of Breast cancer.  ROS:   Please see the history of present illness.     All other systems reviewed and are negative.  EKGs/Labs/Other Studies Reviewed:    The following studies were reviewed today:   EKG:  EKG is  ordered today.  The ekg ordered today demonstrates sinus rhythm, occasional, PVCs  Recent Labs: 01/29/2020: Hemoglobin 12.5; Platelets 150 02/20/2020: BUN 20; Creatinine, Ser 1.49;  Potassium 3.6; Sodium 139  Recent Lipid Panel No results found for: CHOL, TRIG, HDL, CHOLHDL, VLDL, LDLCALC, LDLDIRECT   Risk Assessment/Calculations:     CHA2DS2-VASc Score = 5  This indicates a 7.2% annual risk of stroke. The patient's score is based upon: CHF History: 1 HTN History: 1 Diabetes History: 1 Stroke History: 0 Vascular Disease History: 0 Age Score: 1 Gender Score: 1     Physical Exam:    VS:  BP 118/70 (BP Location: Left Arm, Patient Position: Sitting, Cuff Size: Normal)   Pulse 80   Ht 4' 11"  (1.499 m)   Wt 202 lb 4 oz (91.7 kg)   SpO2 98%   BMI 40.85 kg/m     Wt Readings from Last 3 Encounters:  02/21/20 202 lb 4 oz (91.7 kg)  02/15/20 213 lb 4 oz (96.7 kg)  02/13/20 212 lb (96.2 kg)     GEN:  Well nourished, well developed in no acute distress HEENT: Normal NECK: No JVD; No carotid bruits LYMPHATICS: No lymphadenopathy CARDIAC: Regular rate and rhythm, no murmurs RESPIRATORY: Decreased breath sounds at left base ABDOMEN: Soft, non-tender, non-distended MUSCULOSKELETAL:  No edema; No deformity  SKIN: Warm and dry NEUROLOGIC:  Alert and oriented x 3 PSYCHIATRIC:  Normal affect   ASSESSMENT:    1. Paroxysmal atrial fibrillation (HCC)   2. NICM (nonischemic cardiomyopathy) ([place])   3. ICD (implantable cardioverter-defibrillator) in place    PLAN:    In order of problems listed above:  1. Paroxysmal atrial fibrillation,  S/p DCCV,maintaining sinus rhythm., CHA2DS2-VASc of 5.  Heart rate controlled.  Start Toprol-XL 50 mg daily, stop Lopressor.  Continue Eliquis 5 mg twice daily.   2. History of nonischemic cardiomyopathy, start Toprol-XL 50 mg daily.  OptiVol numbers better.  Patient also feels better with diuresing.  Creatinine increased to 1.49 from 1.2.  Decrease torsemide to 20 mg daily.  Daily weights advised.  Can take extra dose of torsemide if she gains over 3 pounds in a day or 5 pounds in a week.  Continue lisinopril 5 mg daily.  If  blood pressure permits at follow-up visit, will start Entresto. 3. ICD, keep appointment with device clinic for frequent checks.  Follow-up in 1 month  Medication Adjustments/Labs and Tests Ordered: Current medicines are reviewed at length with the patient today.  Concerns regarding medicines are outlined above.  Orders Placed This Encounter  Procedures  . Basic metabolic panel  . EKG 12-Lead   Meds ordered this encounter  Medications  . metoprolol succinate (TOPROL-XL) 50 MG 24 hr tablet    Sig: Take 1 tablet (50 mg total) by mouth daily. Take with or immediately following a meal.    Dispense:  30 tablet    Refill:  5  . torsemide (DEMADEX) 20 MG tablet    Sig: Take 1 tablet (20 mg total) by mouth daily.    Dispense:  180 tablet    Refill:  3    Patient Instructions  Medication Instructions:   Your physician has recommended you make the following change in your medication:   1.  STOP taking . 2.  START taking metoprolol succinate (TOPROL-XL): Take 1 tablet (50 mg total) by mouth daily. 3.  DECREASE your torsemide (DEMADEX) 20 MG tablet: Take 1 tablet (50 mg total) by mouth daily.  -If you have 3 lbs of weight gain overnight, or 5 lbs of weight gain in a week, take 1 extra tablet.  *If you need a refill on your cardiac medications before your next appointment, please call your pharmacy*   Lab Work: Your physician recommends that you return for lab work in: 2 weeks (03/06/20)  - Please go to the [facility]. You will check in at the front desk to the right as you walk into the atrium. Valet Parking is offered if needed. - No appointment needed. You may go any day between 7 am and 6 pm.    Testing/Procedures: None Ordered   Follow-Up: At [facility], you and your health needs are our priority.  As part of our continuing mission to provide you with exceptional heart care, we have created designated Provider Care Teams.  These Care Teams include your primary  Cardiologist (physician) and Advanced Practice Providers (APPs -  Physician Assistants and Nurse Practitioners) who all work together to provide you with the care you need, when you need it.  We recommend signing up for the patient portal called "MyChart".  Sign up information is provided on this After Visit Summary.  MyChart is used to connect with patients for Virtual Visits (Telemedicine).  Patients are able to view lab/test results, encounter notes, upcoming appointments, etc.  Non-urgent messages can be sent to your provider as well.   To learn more about what you can do with MyChart, go to [email or web address].    Your next appointment:   1 month(s)  The format for your next appointment:   In Person  Provider:   Kate Sable, MD   Other Instructions      Signed, Kate Sable, MD  02/21/2020 2:16 PM    [place]

## 2020-02-21 NOTE — Telephone Encounter (Signed)
°  Patient feels better. Optivol still elevated.

## 2020-02-21 NOTE — Telephone Encounter (Signed)
Noted- patient is scheduled to see Dr. Garen Lah today at 1:00 pm.

## 2020-02-21 NOTE — Telephone Encounter (Signed)
Received transmission no need for manual send at this time.

## 2020-02-21 NOTE — Telephone Encounter (Signed)
Attempted to call the patient on her cell #. No answer- I left a detailed message of BMP results and asked that she call back about rescheduling her appointment to 1pm with Dr. Garen Lah today.  I advised if this is not ok, then we can still see her at 3:00 pm and change her to Dr. Thereasa Solo schedule.  Attempted to call the home #- no answer. I asked that she call back prior to her appointment today.

## 2020-02-21 NOTE — Telephone Encounter (Signed)
Kate Sable, MD  02/20/2020 5:18 PM EST     Creatinine slightly increased, keep follow-up appointment tomorrow. Okay if possible to add patient to my schedule in the afternoon between 1 and 3 PM. Make sense for me to see her since I know her very well. Thank you

## 2020-02-21 NOTE — Patient Instructions (Addendum)
Medication Instructions:   Your physician has recommended you make the following change in your medication:   1.  STOP taking . 2.  START taking metoprolol succinate (TOPROL-XL): Take 1 tablet (50 mg total) by mouth daily. 3.  DECREASE your torsemide (DEMADEX) 20 MG tablet: Take 1 tablet (50 mg total) by mouth daily.  -If you have 3 lbs of weight gain overnight, or 5 lbs of weight gain in a week, take 1 extra tablet.  *If you need a refill on your cardiac medications before your next appointment, please call your pharmacy*   Lab Work: Your physician recommends that you return for lab work in: 2 weeks (03/06/20)  - Please go to the [facility]. You will check in at the front desk to the right as you walk into the atrium. Valet Parking is offered if needed. - No appointment needed. You may go any day between 7 am and 6 pm.    Testing/Procedures: None Ordered   Follow-Up: At [facility], you and your health needs are our priority.  As part of our continuing mission to provide you with exceptional heart care, we have created designated Provider Care Teams.  These Care Teams include your primary Cardiologist (physician) and Advanced Practice Providers (APPs -  Physician Assistants and Nurse Practitioners) who all work together to provide you with the care you need, when you need it.  We recommend signing up for the patient portal called "MyChart".  Sign up information is provided on this After Visit Summary.  MyChart is used to connect with patients for Virtual Visits (Telemedicine).  Patients are able to view lab/test results, encounter notes, upcoming appointments, etc.  Non-urgent messages can be sent to your provider as well.   To learn more about what you can do with MyChart, go to [email or web address].    Your next appointment:   1 month(s)  The format for your next appointment:   In Person  Provider:   Kate Sable, MD   Other Instructions

## 2020-02-21 NOTE — Telephone Encounter (Signed)
Noted  

## 2020-02-21 NOTE — Telephone Encounter (Signed)
Patient seen in office today by Dr. Garen Lah.

## 2020-02-25 ENCOUNTER — Telehealth: Payer: Self-pay

## 2020-02-25 DIAGNOSIS — E1142 Type 2 diabetes mellitus with diabetic polyneuropathy: Secondary | ICD-10-CM | POA: Diagnosis not present

## 2020-02-25 DIAGNOSIS — N1832 Chronic kidney disease, stage 3b: Secondary | ICD-10-CM | POA: Diagnosis not present

## 2020-02-25 DIAGNOSIS — E1165 Type 2 diabetes mellitus with hyperglycemia: Secondary | ICD-10-CM | POA: Diagnosis not present

## 2020-02-25 DIAGNOSIS — E039 Hypothyroidism, unspecified: Secondary | ICD-10-CM | POA: Diagnosis not present

## 2020-02-25 DIAGNOSIS — E1122 Type 2 diabetes mellitus with diabetic chronic kidney disease: Secondary | ICD-10-CM | POA: Diagnosis not present

## 2020-02-25 DIAGNOSIS — I10 Essential (primary) hypertension: Secondary | ICD-10-CM | POA: Diagnosis not present

## 2020-02-25 NOTE — Telephone Encounter (Signed)
Spoke with pt and advised per Dr [name] she will need a repeat Dig level.  Pt states Dr Garen Lah stopped her Digoxin and she is to follow up with him 04/03/2020.  Pt advised will forward information to Dr [name].

## 2020-02-25 NOTE — Telephone Encounter (Signed)
-----   Message from Deboraha Sprang, MD sent at 02/23/2020 10:01 AM EST ----- M  Please Inform Patient That we will need another dig level  Thanks

## 2020-02-26 ENCOUNTER — Ambulatory Visit: Payer: Medicare HMO | Admitting: Physician Assistant

## 2020-03-06 ENCOUNTER — Other Ambulatory Visit
Admission: RE | Admit: 2020-03-06 | Discharge: 2020-03-06 | Disposition: A | Discharge status: Home / Self Care | Payer: Medicare HMO | Attending: Cardiology | Admitting: Cardiology

## 2020-03-06 DIAGNOSIS — I480 Paroxysmal atrial fibrillation: Secondary | ICD-10-CM | POA: Diagnosis not present

## 2020-03-06 DIAGNOSIS — I428 Other cardiomyopathies: Secondary | ICD-10-CM | POA: Diagnosis not present

## 2020-03-06 LAB — BASIC METABOLIC PANEL
Anion gap: 12 (ref 5–15)
BUN: 20 mg/dL (ref 8–23)
CO2: 27 mmol/L (ref 22–32)
Calcium: 10.2 mg/dL (ref 8.9–10.3)
Chloride: 99 mmol/L (ref 98–111)
Creatinine, Ser: 1.31 mg/dL — ABNORMAL HIGH (ref 0.44–1.00)
GFR, Estimated: 44 mL/min — ABNORMAL LOW (ref >60)
Glucose, Bld: 146 mg/dL — ABNORMAL HIGH (ref 70–99)
Potassium: 3.8 mmol/L (ref 3.5–5.1)
Sodium: 138 mmol/L (ref 135–145)

## 2020-03-07 ENCOUNTER — Telehealth: Payer: Self-pay | Admitting: Internal Medicine

## 2020-03-07 NOTE — Telephone Encounter (Signed)
Patient calling in wanting to make sure she is safe to get vaccine booster with her of her recent events. Patient requested this be sent to Dr. Aquilla Hacker nurse

## 2020-03-07 NOTE — Telephone Encounter (Signed)
To Dr. [name] to review-   Patient is new onset atrial fibrillation. She is s/p DCCV on 02/13/20. She did not tolerate her atrial fibrillation very well- Optivol reading was elevated. She has undergone diuresis.  She had a follow up with Dr. Garen Lah on 02/21/20 and she was holding NSR at that time.

## 2020-03-07 NOTE — Telephone Encounter (Signed)
Secure received from Dr. [name] regarding the patient's message. Per Dr. [name]- the patient is ok to proceed with her COVID booster.  I have spoken with the patient, she is aware of MD recommendations and voices understanding.

## 2020-03-10 ENCOUNTER — Telehealth: Payer: Self-pay

## 2020-03-10 ENCOUNTER — Ambulatory Visit (INDEPENDENT_AMBULATORY_CARE_PROVIDER_SITE_OTHER): Payer: Medicare HMO

## 2020-03-10 DIAGNOSIS — I428 Other cardiomyopathies: Secondary | ICD-10-CM

## 2020-03-10 DIAGNOSIS — Z95810 Presence of automatic (implantable) cardiac defibrillator: Secondary | ICD-10-CM | POA: Diagnosis not present

## 2020-03-10 MED ORDER — TORSEMIDE 20 MG PO TABS: 20.0000 mg | ORAL_TABLET | Freq: Every day | ORAL | 3 refills | Status: DC

## 2020-03-10 NOTE — Telephone Encounter (Signed)
Spoke with patient and gave her the following result note:  Kate Sable, MD  Kavin Leech, RN Creatinine/kidney function improved. Continue diuretic/torsemide as prescribed. Monitor weights closely, take extra dose of torsemide with weight gain of 3 pounds in a day or 5 pounds in a week. Keep follow-up appointment.   Patient agreed with plan and was grateful for the call.

## 2020-03-11 NOTE — Progress Notes (Signed)
EPIC Encounter for ICM Monitoring  Patient Name: Katherine Rosales is a 69 y.o. female Date: 03/11/2020 Primary Care Physican: Dion Body, MD Primary [place] Electrophysiologist: [name] 12/7/2021Weight: 199 lbs   Spoke with patient and transmission reviewed.  She said she is feeling fine and denies any fluid symptoms.  She is monitor weight very closely and knows to take extra Torsemide when needed.  Optivol thoracic impedancesuggesting normal fluid levels.   Prescribed:Torsemide20 mgtake 1 tablet (20 mg total) by mouth daily. If you have a weight gain of 3 lbs in 1 day, or 5 lbs in 1 week, take 1 extra dose (20 MG).  Labs: 03/06/2020 Creatinine 1.31, BUN 20, Potassium 3.8, Sodium 138, GFR 44 02/20/2020 Creatinine 1.49, BUN 20, Potassium 3.6, Sodium 139, GFR 38  01/28/2020 Creatinine 1.28, BUN 18, Potassium 4.0, Sodium 137, GFR 45 01/09/2019 Creatinine 1.21, BUN 25, Potassium 4.1, Sodium 137, GFR 46-53 A complete set of results can be found in Results Review.  Recommendations:   Follow-up plan: ICM clinic phone appointment on1/01/2021.91 day device clinic remote transmission2/10/2020.  EP/Cardiology Office Visits: 04/03/2020 with Dr Garen Lah.  Copy of ICM check sent to [place].  3 month ICM trend: 03/10/2020    1 Year ICM trend:       [name], RN 03/11/2020 3:31 PM

## 2020-03-18 DIAGNOSIS — N183 Chronic kidney disease, stage 3 unspecified: Secondary | ICD-10-CM | POA: Diagnosis not present

## 2020-03-18 DIAGNOSIS — E039 Hypothyroidism, unspecified: Secondary | ICD-10-CM | POA: Diagnosis not present

## 2020-03-20 DIAGNOSIS — Z6841 Body Mass Index (BMI) 40.0 and over, adult: Secondary | ICD-10-CM | POA: Diagnosis not present

## 2020-03-20 DIAGNOSIS — E039 Hypothyroidism, unspecified: Secondary | ICD-10-CM | POA: Diagnosis not present

## 2020-03-20 DIAGNOSIS — N1831 Chronic kidney disease, stage 3a: Secondary | ICD-10-CM | POA: Diagnosis not present

## 2020-03-20 DIAGNOSIS — Z0000 Encounter for general adult medical examination without abnormal findings: Secondary | ICD-10-CM | POA: Diagnosis not present

## 2020-03-20 DIAGNOSIS — E1143 Type 2 diabetes mellitus with diabetic autonomic (poly)neuropathy: Secondary | ICD-10-CM | POA: Diagnosis not present

## 2020-03-20 DIAGNOSIS — F331 Major depressive disorder, recurrent, moderate: Secondary | ICD-10-CM | POA: Diagnosis not present

## 2020-03-20 DIAGNOSIS — E1122 Type 2 diabetes mellitus with diabetic chronic kidney disease: Secondary | ICD-10-CM | POA: Diagnosis not present

## 2020-03-20 DIAGNOSIS — D693 Immune thrombocytopenic purpura: Secondary | ICD-10-CM | POA: Diagnosis not present

## 2020-04-03 ENCOUNTER — Ambulatory Visit: Payer: Medicare HMO | Admitting: Cardiology

## 2020-04-03 ENCOUNTER — Encounter: Payer: Self-pay | Admitting: Cardiology

## 2020-04-03 ENCOUNTER — Other Ambulatory Visit: Payer: Self-pay

## 2020-04-03 VITALS — BP 120/78 | HR 81 | Ht 59.0 in | Wt 201.1 lb

## 2020-04-03 DIAGNOSIS — E1122 Type 2 diabetes mellitus with diabetic chronic kidney disease: Secondary | ICD-10-CM | POA: Diagnosis not present

## 2020-04-03 DIAGNOSIS — R809 Proteinuria, unspecified: Secondary | ICD-10-CM | POA: Diagnosis not present

## 2020-04-03 DIAGNOSIS — I428 Other cardiomyopathies: Secondary | ICD-10-CM | POA: Diagnosis not present

## 2020-04-03 DIAGNOSIS — Z95810 Presence of automatic (implantable) cardiac defibrillator: Secondary | ICD-10-CM

## 2020-04-03 DIAGNOSIS — N2581 Secondary hyperparathyroidism of renal origin: Secondary | ICD-10-CM | POA: Diagnosis not present

## 2020-04-03 DIAGNOSIS — N1832 Chronic kidney disease, stage 3b: Secondary | ICD-10-CM | POA: Diagnosis not present

## 2020-04-03 DIAGNOSIS — I129 Hypertensive chronic kidney disease with stage 1 through stage 4 chronic kidney disease, or unspecified chronic kidney disease: Secondary | ICD-10-CM | POA: Diagnosis not present

## 2020-04-03 DIAGNOSIS — I480 Paroxysmal atrial fibrillation: Secondary | ICD-10-CM

## 2020-04-03 DIAGNOSIS — N041 Nephrotic syndrome with focal and segmental glomerular lesions: Secondary | ICD-10-CM | POA: Diagnosis not present

## 2020-04-03 MED ORDER — ENTRESTO 24-26 MG PO TABS: 1.0000 | ORAL_TABLET | Freq: Two times a day (BID) | ORAL | 3 refills | Status: DC

## 2020-04-03 NOTE — Progress Notes (Signed)
Cardiology Office Note:    Date:  04/03/2020   ID:  Katherine Rosales, DOB 07/05/1950, MRN [id number]  PCP:  Dion Body, MD  [facility] Cardiologist:  [name], MD  [facility] Electrophysiologist:  None   Referring MD: Dion Body, MD   Chief Complaint  Patient presents with  . paroxymal a-fib    "doing well."     History of Present Illness:    Katherine Rosales is a 69 y.o. female with a hx of NICM s/p ICD, last EF 30-35%, persistent A. Fib s/p DCCV 02/13/2020, hypertension, diabetes, CKD stage III, OSA who presents for follow-up.  Last seen due to shortness of breath and volume overload.  Torsemide was decreased to 20 mg daily due to rising creatinine.  Creatinine improved after decreased dose of torsemide.  Now breathing much better, her weight has been stable.  Has an appointment to see nephrology today.  She feels well, tolerating current medicines without any adverse effects.  She is compliant with her CPAP.  Has some back issues, degenerative disease.  Was told she may need surgery, she is trying to hold off.  Prior notes device check on 11/12/2019 showed normal device functioning.  echocardiogram 12/08/2018 showed moderate to severely reduced ejection fraction, EF 30 to 35%.  Patient not sure how long she has been in atrial fibrillation.  States having low blood pressures at home with systolic in the 37C, lisinopril was decreased to 10 mg.  On follow-up with PCP yesterday, lisinopril stopped due to systolics in the 58I.  She is supposed to be on a CPAP for OSA but has not been compliant.  Past Medical History:  Diagnosis Date  . 5027 Lead-Medtronic   . Anemia 11/12/2016  . Anxiety 12/25/2013  . Automatic implantable cardioverter-defibrillator in situ   . Breast cancer ([place]) 03/1995   right breast ca with lumpectomy and chemo and rad  . Cardiomyopathy, nonischemic ([place])   . Chronic kidney disease 11/12/2016   stage 3  . Depression   . Diabetes  mellitus    Type II Diabetes  . Hypertension   . Hypothyroidism   . ICD (implantable cardiac defibrillator), singleMedtronic   . NASH (nonalcoholic steatohepatitis)   . Personal history of chemotherapy 1996   right breast ca  . Personal history of radiation therapy 1996   right breast ca  . Sleep apnea    On CPAP  . Spleen enlarged   . Systolic heart failure, chronic ([place])     Past Surgical History:  Procedure Laterality Date  . ABDOMINAL HYSTERECTOMY    . BREAST BIOPSY Right 11/26/2015   - FAT NECROSIS, FIBROSIS, AND CHRONIC INFLAMMATION.   Marland Kitchen BREAST BIOPSY Right 12/20/2018   Affirm Bx- X-clip- path pending  . BREAST BIOPSY WITH RADIO FREQUENCY LOCALIZER Right 10/18/2019   Procedure: RF TAG GUIDED EXCISIONAL BREAST BIOPSY;  Surgeon: Benjamine Sprague, DO;  Location: [facility] ORS;  Service: General;  Laterality: Right;  . BREAST EXCISIONAL BIOPSY Right 03/1995   + breast ca chemo and rad. 9:00 region  . BREAST EXCISIONAL BIOPSY Right 2011   surgical bx neg  11:00 region  . BREAST LUMPECTOMY Right 1996  . CARDIOVERSION N/A 02/13/2020   Procedure: CARDIOVERSION;  Surgeon: Kate Sable, MD;  Location: [facility] ORS;  Service: Cardiovascular;  Laterality: N/A;  . CHOLECYSTECTOMY    . COLONOSCOPY  03/05/2013  . COLONOSCOPY W/ POLYPECTOMY  03/05/2013  . COLONOSCOPY WITH PROPOFOL N/A 10/03/2017   Procedure: COLONOSCOPY WITH PROPOFOL;  Surgeon:  Manya Silvas, MD;  Location: [facility] ENDOSCOPY;  Service: Endoscopy;  Laterality: N/A;  . ESOPHAGOGASTRODUODENOSCOPY  01/11/2003, 06/25/2015  . ESOPHAGOGASTRODUODENOSCOPY (EGD) WITH PROPOFOL N/A 06/25/2015   Procedure: ESOPHAGOGASTRODUODENOSCOPY (EGD) WITH PROPOFOL;  Surgeon: Manya Silvas, MD;  Location: [facility] ENDOSCOPY;  Service: Endoscopy;  Laterality: N/A;  . FOOT SURGERY Right   . IMPLANTABLE CARDIOVERTER DEFIBRILLATOR (ICD) GENERATOR CHANGE N/A 11/15/2012   Procedure: ICD GENERATOR CHANGE;  Surgeon: Deboraha Sprang, MD;  Location: [facility] CATH LAB;   Service: Cardiovascular;  Laterality: N/A;  . JOINT REPLACEMENT  2009   Total Knee Replacement, Left  . LEAD REVISION N/A 11/15/2012   Procedure: LEAD REVISION;  Surgeon: Deboraha Sprang, MD;  Location: [facility] CATH LAB;  Service: Cardiovascular;  Laterality: N/A;  . PACEMAKER INSERTION  06/30/2005   Medtronic Virtuoso VR ICD - Nonischemic cardiomyopathy potentially related to adriamycin, class 3 congestive failture with a narrow QRS and a negative TDI  . TONSILLECTOMY    . TUBAL LIGATION      Current Medications: Current Meds  Medication Sig  . apixaban (ELIQUIS) 5 MG TABS tablet Take 1 tablet (5 mg total) by mouth 2 (two) times daily.  . Calcium Carbonate-Vitamin D 600-400 MG-UNIT tablet Take 1 tablet by mouth 2 (two) times daily.    Marland Kitchen docusate sodium (COLACE) 100 MG capsule Take 300 mg by mouth at bedtime.   . DULoxetine (CYMBALTA) 30 MG capsule Take 30 mg by mouth daily.  Marland Kitchen gabapentin (NEURONTIN) 600 MG tablet Take 600 mg by mouth 2 (two) times daily.  Marland Kitchen glimepiride (AMARYL) 4 MG tablet Take 4 mg by mouth 2 (two) times daily before lunch and supper.   . levothyroxine (SYNTHROID, LEVOTHROID) 100 MCG tablet Take 100 mcg by mouth daily.   . metoprolol succinate (TOPROL-XL) 50 MG 24 hr tablet Take 1 tablet (50 mg total) by mouth daily. Take with or immediately following a meal.  . Multiple Vitamin (MULTIVITAMIN WITH MINERALS) TABS tablet Take 1 tablet by mouth daily. Centrum Silver  . Multiple Vitamins-Minerals (EYE-VITE EXTRA PLUS LUTEIN PO) Take 1 tablet by mouth daily.  Marland Kitchen omeprazole (PRILOSEC) 20 MG capsule Take 20 mg by mouth daily.    . sacubitril-valsartan (ENTRESTO) 24-26 MG Take 1 tablet by mouth 2 (two) times daily.  . Semaglutide,0.25 or 0.5MG/DOS, (OZEMPIC, 0.25 OR 0.5 MG/DOSE,) 2 MG/1.5ML SOPN Inject 0.5 mg into the skin every Friday.   . torsemide (DEMADEX) 20 MG tablet Take 1 tablet (20 mg total) by mouth daily. If you have a weight gain of 3 lbs in 1 day, or 5 lbs in 1 week, take 1  extra dose (20 MG).  . [DISCONTINUED] lisinopril (ZESTRIL) 10 MG tablet Take 0.5 tablets (5 mg total) by mouth daily.     Allergies:   Ibuprofen   Social History   Socioeconomic History  . Marital status: Widowed    Spouse name: Not on file  . Number of children: Not on file  . Years of education: Not on file  . Highest education level: Not on file  Occupational History  . Occupation: Full time    Employer: [employer]  Tobacco Use  . Smoking status: Never Smoker  . Smokeless tobacco: Never Used  Vaping Use  . Vaping Use: Never used  Substance and Sexual Activity  . Alcohol use: No  . Drug use: No  . Sexual activity: Not on file  Other Topics Concern  . Not on file  Social History Narrative   Married  Does not get regular exercise   Social Determinants of Health   Financial Resource Strain: Not on file  Food Insecurity: Not on file  Transportation Needs: Not on file  Physical Activity: Not on file  Stress: Not on file  Social Connections: Not on file     Family History: The patient's family history includes Prostate cancer in her father. There is no history of Breast cancer.  ROS:   Please see the history of present illness.     All other systems reviewed and are negative.  EKGs/Labs/Other Studies Reviewed:    The following studies were reviewed today:   EKG:  EKG is  ordered today.  The ekg ordered today demonstrates sinus rhythm,  Recent Labs: 01/29/2020: Hemoglobin 12.5; Platelets 150 03/06/2020: BUN 20; Creatinine, Ser 1.31; Potassium 3.8; Sodium 138  Recent Lipid Panel No results found for: CHOL, TRIG, HDL, CHOLHDL, VLDL, LDLCALC, LDLDIRECT   Risk Assessment/Calculations:     CHA2DS2-VASc Score = 5  This indicates a 7.2% annual risk of stroke. The patient's score is based upon: CHF History: Yes HTN History: Yes Diabetes History: Yes Stroke History: No Vascular Disease History: No Age Score: 1 Gender Score: 1     Physical Exam:    VS:   BP 120/78 (BP Location: Left Arm, Patient Position: Sitting, Cuff Size: Normal)   Pulse 81   Ht 4' 11"  (1.499 m)   Wt 201 lb 2 oz (91.2 kg)   SpO2 98%   BMI 40.62 kg/m     Wt Readings from Last 3 Encounters:  04/03/20 201 lb 2 oz (91.2 kg)  02/21/20 202 lb 4 oz (91.7 kg)  02/15/20 213 lb 4 oz (96.7 kg)     GEN:  Well nourished, well developed in no acute distress HEENT: Normal NECK: No JVD; No carotid bruits LYMPHATICS: No lymphadenopathy CARDIAC: Regular rate and rhythm, no murmurs RESPIRATORY: Lungs appear clear ABDOMEN: Soft, non-tender, non-distended MUSCULOSKELETAL:  No edema; No deformity  SKIN: Warm and dry NEUROLOGIC:  Alert and oriented x 3 PSYCHIATRIC:  Normal affect   ASSESSMENT:    1. Paroxysmal atrial fibrillation (HCC)   2. NICM (nonischemic cardiomyopathy) ([place])   3. ICD (implantable cardioverter-defibrillator) in place    PLAN:    In order of problems listed above:  1. Paroxysmal atrial fibrillation,  S/p DCCV 02/13/2020,maintaining sinus rhythm., CHA2DS2-VASc of 5.  Heart rate controlled.  Continue Toprol-XL 50 mg daily.  Continue Eliquis 5 mg twice daily.   2. History of nonischemic cardiomyopathy, stop lisinopril, wait 48 hours, start Entresto 24/26 mg twice daily.  Continue Toprol-XL 50 mg daily.  Continue torsemide 20 mg daily. 3. ICD, keep appointment with device clinic for frequent checks.  Follow-up in 1 month for Entresto titration  Medication Adjustments/Labs and Tests Ordered: Current medicines are reviewed at length with the patient today.  Concerns regarding medicines are outlined above.  Orders Placed This Encounter  Procedures  . EKG 12-Lead   Meds ordered this encounter  Medications  . sacubitril-valsartan (ENTRESTO) 24-26 MG    Sig: Take 1 tablet by mouth 2 (two) times daily.    Dispense:  60 tablet    Refill:  3    Patient Instructions  Medication Instructions:    1.  Stop taking your Lisinopril.  2.  START taking  Entresto 24-26 MG: take 1 tab by mouth two times a day.  *If you need a refill on your cardiac medications before your next appointment, please call your pharmacy*  Lab Work: None Ordered If you have labs (blood work) drawn today and your tests are completely normal, you will receive your results only by: Marland Kitchen MyChart Message (if you have MyChart) OR . A paper copy in the mail If you have any lab test that is abnormal or we need to change your treatment, we will call you to review the results.   Testing/Procedures: None Ordered   Follow-Up: At [facility], you and your health needs are our priority.  As part of our continuing mission to provide you with exceptional heart care, we have created designated Provider Care Teams.  These Care Teams include your primary Cardiologist (physician) and Advanced Practice Providers (APPs -  Physician Assistants and Nurse Practitioners) who all work together to provide you with the care you need, when you need it.  We recommend signing up for the patient portal called "MyChart".  Sign up information is provided on this After Visit Summary.  MyChart is used to connect with patients for Virtual Visits (Telemedicine).  Patients are able to view lab/test results, encounter notes, upcoming appointments, etc.  Non-urgent messages can be sent to your provider as well.   To learn more about what you can do with MyChart, go to [email or web address].    Your next appointment:   1 month(s)  The format for your next appointment:   In Person  Provider:   Kate Sable, MD   Other Instructions      Signed, Kate Sable, MD  04/03/2020 1:02 PM    [place]

## 2020-04-03 NOTE — Patient Instructions (Signed)
Medication Instructions:    1.  Stop taking your Lisinopril.  2.  START taking Entresto 24-26 MG: take 1 tab by mouth two times a day.  *If you need a refill on your cardiac medications before your next appointment, please call your pharmacy*   Lab Work: None Ordered If you have labs (blood work) drawn today and your tests are completely normal, you will receive your results only by: Marland Kitchen MyChart Message (if you have MyChart) OR . A paper copy in the mail If you have any lab test that is abnormal or we need to change your treatment, we will call you to review the results.   Testing/Procedures: None Ordered   Follow-Up: At [facility], you and your health needs are our priority.  As part of our continuing mission to provide you with exceptional heart care, we have created designated Provider Care Teams.  These Care Teams include your primary Cardiologist (physician) and Advanced Practice Providers (APPs -  Physician Assistants and Nurse Practitioners) who all work together to provide you with the care you need, when you need it.  We recommend signing up for the patient portal called "MyChart".  Sign up information is provided on this After Visit Summary.  MyChart is used to connect with patients for Virtual Visits (Telemedicine).  Patients are able to view lab/test results, encounter notes, upcoming appointments, etc.  Non-urgent messages can be sent to your provider as well.   To learn more about what you can do with MyChart, go to [email or web address].    Your next appointment:   1 month(s)  The format for your next appointment:   In Person  Provider:   Kate Sable, MD   Other Instructions

## 2020-04-14 ENCOUNTER — Ambulatory Visit (INDEPENDENT_AMBULATORY_CARE_PROVIDER_SITE_OTHER): Payer: Medicare HMO

## 2020-04-14 DIAGNOSIS — I5022 Chronic systolic (congestive) heart failure: Secondary | ICD-10-CM | POA: Diagnosis not present

## 2020-04-14 DIAGNOSIS — Z95810 Presence of automatic (implantable) cardiac defibrillator: Secondary | ICD-10-CM

## 2020-04-15 DIAGNOSIS — M810 Age-related osteoporosis without current pathological fracture: Secondary | ICD-10-CM | POA: Diagnosis not present

## 2020-04-16 DIAGNOSIS — M810 Age-related osteoporosis without current pathological fracture: Secondary | ICD-10-CM | POA: Insufficient documentation

## 2020-04-16 NOTE — Progress Notes (Signed)
EPIC Encounter for ICM Monitoring  Patient Name: Katherine Rosales is a 70 y.o. female Date: 04/16/2020 Primary Care Physican: Dion Body, MD Primary [place] Electrophysiologist: [name] 04/16/2020 Weight: 203 lbs   Spoke with patient and transmission reviewed.She has drink a lot of fluids and eating foods high in salt such as bags of potato chips  Optivol thoracic impedancesuggesting normal fluid levels.   Prescribed:Torsemide20 mgtake 1 tablet (20 mg total) by mouth daily. If you have a weight gain of 3 lbs in 1 day, or 5 lbs in 1 week, take 1 extra dose (20 MG).  Labs: 03/06/2020 Creatinine 1.31, BUN 20, Potassium 3.8, Sodium 138, GFR 44 02/20/2020 Creatinine 1.49, BUN 20, Potassium 3.6, Sodium 139, GFR 38  01/28/2020 Creatinine 1.28, BUN 18, Potassium 4.0, Sodium 137, GFR 45 01/09/2019 Creatinine 1.21, BUN 25, Potassium 4.1, Sodium 137, GFR 46-53 A complete set of results can be found in Results Review.  Recommendations: Advised to limit fluid intake to 64 oz daily and salt to 2000 mg daily.  She takes extra Torsemide when needed.  Follow-up plan: ICM clinic phone appointment on2/21/2022.91 day device clinic remote transmission2/10/2020.  EP/Cardiology Office Visits: 05/08/2020 with Dr Garen Lah.  Copy of ICM check sent to [place].  3 month ICM trend: 04/14/2020.    1 Year ICM trend:       [name], RN 04/16/2020 2:32 PM

## 2020-05-08 ENCOUNTER — Ambulatory Visit: Payer: Medicare HMO | Admitting: Cardiology

## 2020-05-08 ENCOUNTER — Encounter: Payer: Self-pay | Admitting: Cardiology

## 2020-05-08 ENCOUNTER — Other Ambulatory Visit: Payer: Self-pay

## 2020-05-08 VITALS — BP 100/54 | HR 88 | Ht 59.0 in | Wt 203.2 lb

## 2020-05-08 DIAGNOSIS — I480 Paroxysmal atrial fibrillation: Secondary | ICD-10-CM

## 2020-05-08 DIAGNOSIS — Z95810 Presence of automatic (implantable) cardiac defibrillator: Secondary | ICD-10-CM | POA: Diagnosis not present

## 2020-05-08 DIAGNOSIS — I428 Other cardiomyopathies: Secondary | ICD-10-CM | POA: Diagnosis not present

## 2020-05-08 MED ORDER — METOPROLOL SUCCINATE ER 25 MG PO TB24: 25.0000 mg | ORAL_TABLET | Freq: Every day | ORAL | 5 refills | Status: DC

## 2020-05-08 NOTE — Progress Notes (Signed)
Cardiology Office Note:    Date:  05/08/2020   ID:  KABREA SEENEY, DOB 1950-08-27, MRN [id number]  PCP:  Dion Body, MD  [facility] Cardiologist:  [name], MD  [place] Electrophysiologist:  None   Referring MD: Dion Body, MD   Chief Complaint  Patient presents with  . 1 month follow up.     "doing well." Medications reviewed by the patient verbally. Patient broke out into a sweat with feeling dizzy as she was sitting up from having an EKG.     History of Present Illness:    Katherine Rosales is a 70 y.o. female with a hx of NICM s/p ICD, last EF 30-35%, parox A. Fib s/p DCCV 02/13/2020, hypertension, diabetes, CKD stage III, OSA who presents for follow-up.   Patient being seen for cardiomyopathy and medication titration.  Delene Loll was started after last visit.  Blood pressures have been in the low 716R systolic.  She states doing okay, but in the exam room today after EKG was being obtained, patient broke out in a sweat, felt dizzy.  Blood pressure check revealed systolic of 90.  Blood sugar check revealed 160s.  She states not eating anything yet this AM.  Denies edema, OptiVol and thoracic impedance test last performed on 04/14/2020 showed/suggested normal fluid levels.   Prior notes device check on 11/12/2019 showed normal device functioning.   echocardiogram 12/08/2018 showed moderate to severely reduced ejection fraction, EF 30 to 35%.  Patient not sure how long she has been in atrial fibrillation.  States having low blood pressures at home with systolic in the 67E, lisinopril was decreased to 10 mg.  On follow-up with PCP yesterday, lisinopril stopped due to systolics in the 93Y.  She is supposed to be on a CPAP for OSA but has not been compliant.  Past Medical History:  Diagnosis Date  . 1017 Lead-Medtronic   . Anemia 11/12/2016  . Anxiety 12/25/2013  . Automatic implantable cardioverter-defibrillator in situ   . Breast cancer ([place]) 03/1995    right breast ca with lumpectomy and chemo and rad  . Cardiomyopathy, nonischemic ([place])   . Chronic kidney disease 11/12/2016   stage 3  . Depression   . Diabetes mellitus    Type II Diabetes  . Hypertension   . Hypothyroidism   . ICD (implantable cardiac defibrillator), singleMedtronic   . NASH (nonalcoholic steatohepatitis)   . Personal history of chemotherapy 1996   right breast ca  . Personal history of radiation therapy 1996   right breast ca  . Sleep apnea    On CPAP  . Spleen enlarged   . Systolic heart failure, chronic ([place])     Past Surgical History:  Procedure Laterality Date  . ABDOMINAL HYSTERECTOMY    . BREAST BIOPSY Right 11/26/2015   - FAT NECROSIS, FIBROSIS, AND CHRONIC INFLAMMATION.   Marland Kitchen BREAST BIOPSY Right 12/20/2018   Affirm Bx- X-clip- path pending  . BREAST BIOPSY WITH RADIO FREQUENCY LOCALIZER Right 10/18/2019   Procedure: RF TAG GUIDED EXCISIONAL BREAST BIOPSY;  Surgeon: Benjamine Sprague, DO;  Location: [facility] ORS;  Service: General;  Laterality: Right;  . BREAST EXCISIONAL BIOPSY Right 03/1995   + breast ca chemo and rad. 9:00 region  . BREAST EXCISIONAL BIOPSY Right 2011   surgical bx neg  11:00 region  . BREAST LUMPECTOMY Right 1996  . CARDIOVERSION N/A 02/13/2020   Procedure: CARDIOVERSION;  Surgeon: Kate Sable, MD;  Location: [facility] ORS;  Service: Cardiovascular;  Laterality: N/A;  .  CHOLECYSTECTOMY    . COLONOSCOPY  03/05/2013  . COLONOSCOPY W/ POLYPECTOMY  03/05/2013  . COLONOSCOPY WITH PROPOFOL N/A 10/03/2017   Procedure: COLONOSCOPY WITH PROPOFOL;  Surgeon: Manya Silvas, MD;  Location: [facility] ENDOSCOPY;  Service: Endoscopy;  Laterality: N/A;  . ESOPHAGOGASTRODUODENOSCOPY  01/11/2003, 06/25/2015  . ESOPHAGOGASTRODUODENOSCOPY (EGD) WITH PROPOFOL N/A 06/25/2015   Procedure: ESOPHAGOGASTRODUODENOSCOPY (EGD) WITH PROPOFOL;  Surgeon: Manya Silvas, MD;  Location: [facility] ENDOSCOPY;  Service: Endoscopy;  Laterality: N/A;  . FOOT SURGERY Right   .  IMPLANTABLE CARDIOVERTER DEFIBRILLATOR (ICD) GENERATOR CHANGE N/A 11/15/2012   Procedure: ICD GENERATOR CHANGE;  Surgeon: Deboraha Sprang, MD;  Location: [facility] CATH LAB;  Service: Cardiovascular;  Laterality: N/A;  . JOINT REPLACEMENT  2009   Total Knee Replacement, Left  . LEAD REVISION N/A 11/15/2012   Procedure: LEAD REVISION;  Surgeon: Deboraha Sprang, MD;  Location: [facility] CATH LAB;  Service: Cardiovascular;  Laterality: N/A;  . PACEMAKER INSERTION  06/30/2005   Medtronic Virtuoso VR ICD - Nonischemic cardiomyopathy potentially related to adriamycin, class 3 congestive failture with a narrow QRS and a negative TDI  . TONSILLECTOMY    . TUBAL LIGATION      Current Medications: Current Meds  Medication Sig  . alendronate (FOSAMAX) 70 MG tablet Take 70 mg by mouth once a week.  Marland Kitchen apixaban (ELIQUIS) 5 MG TABS tablet Take 1 tablet (5 mg total) by mouth 2 (two) times daily.  . Calcium Carbonate-Vitamin D 600-400 MG-UNIT tablet Take 1 tablet by mouth 2 (two) times daily.    . Cinnamon 500 MG capsule Take 500 mg by mouth daily.  Marland Kitchen docusate sodium (COLACE) 100 MG capsule Take 300 mg by mouth at bedtime.   . DULoxetine (CYMBALTA) 30 MG capsule Take 30 mg by mouth daily.  Marland Kitchen gabapentin (NEURONTIN) 600 MG tablet Take 600 mg by mouth 2 (two) times daily.  Marland Kitchen glimepiride (AMARYL) 4 MG tablet Take 4 mg by mouth 2 (two) times daily before lunch and supper.   . levothyroxine (SYNTHROID, LEVOTHROID) 100 MCG tablet Take 100 mcg by mouth daily.   . Multiple Vitamin (MULTIVITAMIN WITH MINERALS) TABS tablet Take 1 tablet by mouth daily. Centrum Silver  . Multiple Vitamins-Minerals (EYE-VITE EXTRA PLUS LUTEIN PO) Take 1 tablet by mouth daily.  Marland Kitchen omeprazole (PRILOSEC) 20 MG capsule Take 20 mg by mouth daily.    . Semaglutide,0.25 or 0.5MG/DOS, (OZEMPIC, 0.25 OR 0.5 MG/DOSE,) 2 MG/1.5ML SOPN Inject 0.5 mg into the skin every Friday.   . torsemide (DEMADEX) 20 MG tablet Take 1 tablet (20 mg total) by mouth daily. If you  have a weight gain of 3 lbs in 1 day, or 5 lbs in 1 week, take 1 extra dose (20 MG).  . [DISCONTINUED] metoprolol succinate (TOPROL-XL) 50 MG 24 hr tablet Take 1 tablet (50 mg total) by mouth daily. Take with or immediately following a meal.  . [DISCONTINUED] sacubitril-valsartan (ENTRESTO) 24-26 MG Take 1 tablet by mouth 2 (two) times daily.     Allergies:   Ibuprofen   Social History   Socioeconomic History  . Marital status: Widowed    Spouse name: Not on file  . Number of children: Not on file  . Years of education: Not on file  . Highest education level: Not on file  Occupational History  . Occupation: Full time    Employer: [employer]  Tobacco Use  . Smoking status: Never Smoker  . Smokeless tobacco: Never Used  Vaping Use  . Vaping  Use: Never used  Substance and Sexual Activity  . Alcohol use: No  . Drug use: No  . Sexual activity: Not on file  Other Topics Concern  . Not on file  Social History Narrative   Married   Does not get regular exercise   Social Determinants of Health   Financial Resource Strain: Not on file  Food Insecurity: Not on file  Transportation Needs: Not on file  Physical Activity: Not on file  Stress: Not on file  Social Connections: Not on file     Family History: The patient's family history includes Prostate cancer in her father. There is no history of Breast cancer.  ROS:   Please see the history of present illness.     All other systems reviewed and are negative.  EKGs/Labs/Other Studies Reviewed:    The following studies were reviewed today:   EKG:  EKG is  ordered today.  The ekg ordered today demonstrates sinus rhythm,  Recent Labs: 01/29/2020: Hemoglobin 12.5; Platelets 150 03/06/2020: BUN 20; Creatinine, Ser 1.31; Potassium 3.8; Sodium 138  Recent Lipid Panel No results found for: CHOL, TRIG, HDL, CHOLHDL, VLDL, LDLCALC, LDLDIRECT   Risk Assessment/Calculations:     CHA2DS2-VASc Score = 5  This indicates a 7.2%  annual risk of stroke. The patient's score is based upon: CHF History: Yes HTN History: Yes Diabetes History: Yes Stroke History: No Vascular Disease History: No Age Score: 1 Gender Score: 1     Physical Exam:    VS:  BP (!) 100/54 (BP Location: Left Arm, Patient Position: Sitting, Cuff Size: Normal)   Pulse 88   Ht 4' 11"  (1.499 m)   Wt 203 lb 4 oz (92.2 kg)   SpO2 98%   BMI 41.05 kg/m     Wt Readings from Last 3 Encounters:  05/08/20 203 lb 4 oz (92.2 kg)  04/03/20 201 lb 2 oz (91.2 kg)  02/21/20 202 lb 4 oz (91.7 kg)     GEN:  Well nourished, well developed in no acute distress HEENT: Normal NECK: No JVD; No carotid bruits LYMPHATICS: No lymphadenopathy CARDIAC: Regular rate and rhythm, no murmurs RESPIRATORY: Lungs appear clear ABDOMEN: Soft, non-tender, non-distended MUSCULOSKELETAL:  No edema; No deformity  SKIN: Warm and dry NEUROLOGIC:  Alert and oriented x 3 PSYCHIATRIC:  Normal affect   ASSESSMENT:    1. Paroxysmal atrial fibrillation (HCC)   2. NICM (nonischemic cardiomyopathy) ([place])   3. ICD (implantable cardioverter-defibrillator) in place    PLAN:    In order of problems listed above:  1. Paroxysmal atrial fibrillation,  S/p DCCV 02/13/2020,maintaining sinus rhythm., CHA2DS2-VASc of 5.  Heart rate controlled.  Decrease Toprol-XL to 25 mg daily.  Continue Eliquis 5 mg twice daily.   2. Nonischemic cardiomyopathy, EF 30 to 35%.  Decrease Toprol-XL to 25 mg daily, stop Entresto due to dizziness, hypotension with BP 90/60 laying.  Continue torsemide 20 mg daily.  BP permits, will consider adding lisinopril for GDMT. 3. ICD, keep appointment with device clinic for frequent checks.  Follow-up in 1 month,  Medication Adjustments/Labs and Tests Ordered: Current medicines are reviewed at length with the patient today.  Concerns regarding medicines are outlined above.  Orders Placed This Encounter  Procedures  . EKG 12-Lead   Meds ordered this  encounter  Medications  . metoprolol succinate (TOPROL-XL) 25 MG 24 hr tablet    Sig: Take 1 tablet (25 mg total) by mouth daily. Take with or immediately following a meal.  Dispense:  30 tablet    Refill:  5    Patient Instructions  Medication Instructions:   Your physician has recommended you make the following change in your medication:   1.  STOP taking your Entresto. 2.  DECREASE  Your  metoprolol succinate (TOPROL-XL) to 25 MG once daily.   *If you need a refill on your cardiac medications before your next appointment, please call your pharmacy*   Lab Work: None ordered If you have labs (blood work) drawn today and your tests are completely normal, you will receive your results only by: Marland Kitchen MyChart Message (if you have MyChart) OR . A paper copy in the mail If you have any lab test that is abnormal or we need to change your treatment, we will call you to review the results.   Testing/Procedures: None ordered   Follow-Up: At [facility], you and your health needs are our priority.  As part of our continuing mission to provide you with exceptional heart care, we have created designated Provider Care Teams.  These Care Teams include your primary Cardiologist (physician) and Advanced Practice Providers (APPs -  Physician Assistants and Nurse Practitioners) who all work together to provide you with the care you need, when you need it.  We recommend signing up for the patient portal called "MyChart".  Sign up information is provided on this After Visit Summary.  MyChart is used to connect with patients for Virtual Visits (Telemedicine).  Patients are able to view lab/test results, encounter notes, upcoming appointments, etc.  Non-urgent messages can be sent to your provider as well.   To learn more about what you can do with MyChart, go to [email or web address].    Your next appointment:   1 month(s)  The format for your next appointment:   In Person  Provider:    Kate Sable, MD   Other Instructions      Signed, Kate Sable, MD  05/08/2020 1:05 PM    [place]

## 2020-05-08 NOTE — Patient Instructions (Signed)
Medication Instructions:   Your physician has recommended you make the following change in your medication:   1.  STOP taking your Entresto. 2.  DECREASE  Your  metoprolol succinate (TOPROL-XL) to 25 MG once daily.   *If you need a refill on your cardiac medications before your next appointment, please call your pharmacy*   Lab Work: None ordered If you have labs (blood work) drawn today and your tests are completely normal, you will receive your results only by: Marland Kitchen MyChart Message (if you have MyChart) OR . A paper copy in the mail If you have any lab test that is abnormal or we need to change your treatment, we will call you to review the results.   Testing/Procedures: None ordered   Follow-Up: At [facility], you and your health needs are our priority.  As part of our continuing mission to provide you with exceptional heart care, we have created designated Provider Care Teams.  These Care Teams include your primary Cardiologist (physician) and Advanced Practice Providers (APPs -  Physician Assistants and Nurse Practitioners) who all work together to provide you with the care you need, when you need it.  We recommend signing up for the patient portal called "MyChart".  Sign up information is provided on this After Visit Summary.  MyChart is used to connect with patients for Virtual Visits (Telemedicine).  Patients are able to view lab/test results, encounter notes, upcoming appointments, etc.  Non-urgent messages can be sent to your provider as well.   To learn more about what you can do with MyChart, go to [email or web address].    Your next appointment:   1 month(s)  The format for your next appointment:   In Person  Provider:   Kate Sable, MD   Other Instructions

## 2020-05-12 ENCOUNTER — Ambulatory Visit (INDEPENDENT_AMBULATORY_CARE_PROVIDER_SITE_OTHER): Payer: Medicare HMO

## 2020-05-12 DIAGNOSIS — I5022 Chronic systolic (congestive) heart failure: Secondary | ICD-10-CM

## 2020-05-12 DIAGNOSIS — I480 Paroxysmal atrial fibrillation: Secondary | ICD-10-CM

## 2020-05-14 LAB — CUP PACEART REMOTE DEVICE CHECK
Battery Remaining Longevity: 48 mo
Battery Voltage: 2.97 V
Brady Statistic RV Percent Paced: 0.12 %
Date Time Interrogation Session: 20220207044224
HighPow Impedance: 80 Ohm
Implantable Lead Implant Date: 20140813
Implantable Lead Location: 753860
Implantable Lead Model: 6935
Implantable Pulse Generator Implant Date: 20140813
Lead Channel Impedance Value: 342 Ohm
Lead Channel Impedance Value: 399 Ohm
Lead Channel Pacing Threshold Amplitude: 1 V
Lead Channel Pacing Threshold Pulse Width: 0.4 ms
Lead Channel Sensing Intrinsic Amplitude: 16.25 mV
Lead Channel Sensing Intrinsic Amplitude: 16.25 mV
Lead Channel Setting Pacing Amplitude: 2 V
Lead Channel Setting Pacing Pulse Width: 0.4 ms
Lead Channel Setting Sensing Sensitivity: 0.3 mV

## 2020-05-15 ENCOUNTER — Other Ambulatory Visit: Payer: Self-pay | Admitting: Gastroenterology

## 2020-05-15 DIAGNOSIS — K7460 Unspecified cirrhosis of liver: Secondary | ICD-10-CM

## 2020-05-15 DIAGNOSIS — K2100 Gastro-esophageal reflux disease with esophagitis, without bleeding: Secondary | ICD-10-CM | POA: Diagnosis not present

## 2020-05-15 DIAGNOSIS — Z8711 Personal history of peptic ulcer disease: Secondary | ICD-10-CM | POA: Diagnosis not present

## 2020-05-15 DIAGNOSIS — K7581 Nonalcoholic steatohepatitis (NASH): Secondary | ICD-10-CM

## 2020-05-19 NOTE — Progress Notes (Signed)
Remote ICD transmission.   

## 2020-05-22 ENCOUNTER — Ambulatory Visit
Admission: RE | Admit: 2020-05-22 | Discharge: 2020-05-22 | Disposition: A | Discharge status: Home / Self Care | Payer: Medicare HMO | Source: Ambulatory Visit | Attending: Gastroenterology | Admitting: Gastroenterology

## 2020-05-22 ENCOUNTER — Other Ambulatory Visit: Payer: Self-pay

## 2020-05-22 DIAGNOSIS — K7581 Nonalcoholic steatohepatitis (NASH): Secondary | ICD-10-CM | POA: Diagnosis not present

## 2020-05-22 DIAGNOSIS — K7460 Unspecified cirrhosis of liver: Secondary | ICD-10-CM | POA: Insufficient documentation

## 2020-05-22 DIAGNOSIS — R161 Splenomegaly, not elsewhere classified: Secondary | ICD-10-CM | POA: Diagnosis not present

## 2020-05-22 DIAGNOSIS — Z9049 Acquired absence of other specified parts of digestive tract: Secondary | ICD-10-CM | POA: Diagnosis not present

## 2020-05-26 ENCOUNTER — Ambulatory Visit (INDEPENDENT_AMBULATORY_CARE_PROVIDER_SITE_OTHER): Payer: Medicare HMO

## 2020-05-26 DIAGNOSIS — I5022 Chronic systolic (congestive) heart failure: Secondary | ICD-10-CM | POA: Diagnosis not present

## 2020-05-26 DIAGNOSIS — Z95810 Presence of automatic (implantable) cardiac defibrillator: Secondary | ICD-10-CM

## 2020-05-26 NOTE — Progress Notes (Signed)
EPIC Encounter for ICM Monitoring  Patient Name: Katherine Rosales is a 70 y.o. female Date: 05/26/2020 Primary Care Physican: Dion Body, MD Primary Cardiologist:Agbor-Etang Electrophysiologist: [name] 05/26/2020 Weight: 204 lbs   Spoke with patient and is not feeling well for the past couple of days.  She is SOB and has vertigo.  Optivol thoracic impedancesuggesting possible fluid accumulation starting 04/21/2020 with a few days at baseline.   Prescribed:Torsemide20 mgtake 1 tablet (20 mg total) by mouth daily. If you have a weight gain of 3 lbs in 1 day, or 5 lbs in 1 week, take 1 extra dose (20 MG).  Labs: 03/06/2020 Creatinine1.31Jari Sportsman, Potassium3.8, [id number], VVK12 02/20/2020 Creatinine1.49, BUN20, Potassium3.6, [id number], [id number] 01/28/2020 Creatinine 1.28, BUN 18, Potassium 4.0, Sodium 137, GFR 45 01/09/2019 Creatinine 1.21, BUN 25, Potassium 4.1, Sodium 137, GFR 46-53 A complete set of results can be found in Results Review.  Recommendations:Advised to limit fluid intake to 64 oz daily and salt to 2000 mg daily.  Advised to take extra Torsemide x 2 days.  Follow-up plan: ICM clinic phone appointment on3/05/2020 to recheck fluid levels before OV.91 day device clinic remote transmission5/12/2020.  EP/Cardiology Office Visits: 06/05/2020 with Dr Garen Lah.  Copy of ICM check sent to [place].   3 month ICM trend: 05/26/2020.    1 Year ICM trend:       [name], RN 05/26/2020 1:40 PM

## 2020-06-04 ENCOUNTER — Ambulatory Visit (INDEPENDENT_AMBULATORY_CARE_PROVIDER_SITE_OTHER): Payer: Medicare HMO

## 2020-06-04 DIAGNOSIS — I5022 Chronic systolic (congestive) heart failure: Secondary | ICD-10-CM

## 2020-06-04 DIAGNOSIS — Z95810 Presence of automatic (implantable) cardiac defibrillator: Secondary | ICD-10-CM

## 2020-06-04 NOTE — Progress Notes (Signed)
EPIC Encounter for ICM Monitoring  Patient Name: Katherine Rosales is a 70 y.o. female Date: 06/04/2020 Primary Care Physican: Dion Body, MD Primary Cardiologist:Agbor-Etang Electrophysiologist: [name] 05/26/2020 Weight: 204 lbs   Spoke with patient and she reports feeling better.  She did take an extra Torsemide today because she thought she may be retaining fluid again.  Optivol thoracic impedancesuggesting fluid levels returned to normal taking extra Torsemide.  Impedance was suggesting possible fluid accumulation from 04/21/2020 until 05/29/2020.  Prescribed:Torsemide20 mgtake 1 tablet (20 mg total) by mouth daily. If you have a weight gain of 3 lbs in 1 day, or 5 lbs in 1 week, take 1 extra dose (20 MG).  Labs: 03/06/2020 Creatinine1.31Jari Sportsman, Potassium3.8, [id number], OAC16 02/20/2020 Creatinine1.49, BUN20, Potassium3.6, [id number], [id number] 01/28/2020 Creatinine 1.28, BUN 18, Potassium 4.0, Sodium 137, GFR 45 01/09/2019 Creatinine 1.21, BUN 25, Potassium 4.1, Sodium 137, GFR 46-53 A complete set of results can be found in Results Review.  Recommendations: No changes and encouraged to call if experiencing any fluid symptoms.  Follow-up plan: ICM clinic phone appointment on4/07/2020.91 day device clinic remote transmission5/12/2020.  EP/Cardiology Office Visits: 3/3/2022with Dr Garen Lah.  Copy of ICM check sent to Dr.Klein and Dr Garen Lah as Juluis Rainier since patient has OV with him on 06/05/2020.  3 month ICM trend: 06/04/2020.    1 Year ICM trend:       [name], RN 06/04/2020 12:25 PM

## 2020-06-05 ENCOUNTER — Other Ambulatory Visit: Payer: Self-pay

## 2020-06-05 ENCOUNTER — Encounter: Payer: Self-pay | Admitting: Cardiology

## 2020-06-05 ENCOUNTER — Ambulatory Visit: Payer: Medicare HMO | Admitting: Cardiology

## 2020-06-05 VITALS — BP 124/74 | HR 98 | Ht 59.0 in | Wt 203.1 lb

## 2020-06-05 DIAGNOSIS — E039 Hypothyroidism, unspecified: Secondary | ICD-10-CM | POA: Diagnosis not present

## 2020-06-05 DIAGNOSIS — I480 Paroxysmal atrial fibrillation: Secondary | ICD-10-CM | POA: Diagnosis not present

## 2020-06-05 DIAGNOSIS — I10 Essential (primary) hypertension: Secondary | ICD-10-CM | POA: Diagnosis not present

## 2020-06-05 DIAGNOSIS — E1122 Type 2 diabetes mellitus with diabetic chronic kidney disease: Secondary | ICD-10-CM | POA: Diagnosis not present

## 2020-06-05 DIAGNOSIS — Z95810 Presence of automatic (implantable) cardiac defibrillator: Secondary | ICD-10-CM

## 2020-06-05 DIAGNOSIS — I428 Other cardiomyopathies: Secondary | ICD-10-CM | POA: Diagnosis not present

## 2020-06-05 DIAGNOSIS — E1142 Type 2 diabetes mellitus with diabetic polyneuropathy: Secondary | ICD-10-CM | POA: Diagnosis not present

## 2020-06-05 DIAGNOSIS — N1832 Chronic kidney disease, stage 3b: Secondary | ICD-10-CM | POA: Diagnosis not present

## 2020-06-05 MED ORDER — TORSEMIDE 40 MG PO TABS: 40.0000 mg | ORAL_TABLET | Freq: Every day | ORAL | 3 refills | Status: DC

## 2020-06-05 MED ORDER — LISINOPRIL 2.5 MG PO TABS: 2.5000 mg | ORAL_TABLET | Freq: Every day | ORAL | 2 refills | Status: DC

## 2020-06-05 NOTE — Patient Instructions (Signed)
Medication Instructions:  Your physician has recommended you make the following change in your medication:   1.  INCREASE your Torsemide to 40 MG daily. 2.  START taking Lisinopril 2.5 MG daily.  *If you need a refill on your cardiac medications before your next appointment, please call your pharmacy*   Lab Work:  None ordered   Testing/Procedures:  None ordered   Follow-Up: At [facility], you and your health needs are our priority.  As part of our continuing mission to provide you with exceptional heart care, we have created designated Provider Care Teams.  These Care Teams include your primary Cardiologist (physician) and Advanced Practice Providers (APPs -  Physician Assistants and Nurse Practitioners) who all work together to provide you with the care you need, when you need it.  We recommend signing up for the patient portal called "MyChart".  Sign up information is provided on this After Visit Summary.  MyChart is used to connect with patients for Virtual Visits (Telemedicine).  Patients are able to view lab/test results, encounter notes, upcoming appointments, etc.  Non-urgent messages can be sent to your provider as well.   To learn more about what you can do with MyChart, go to [email or web address].    Your next appointment:   2-3 month(s)  The format for your next appointment:   In Person  Provider:   Kate Sable, MD   Other Instructions

## 2020-06-05 NOTE — Progress Notes (Signed)
Cardiology Office Note:    Date:  06/05/2020   ID:  AQUILLA SHAMBLEY, DOB Jul 19, 1950, MRN [id number]  PCP:  Dion Body, MD  [facility] Cardiologist:  [name], MD  [place] Electrophysiologist:  None   Referring MD: Dion Body, MD   Chief Complaint  Patient presents with  . Other    1 month f/u no complaints today. Meds reviewed verbally with pt.    History of Present Illness:    Katherine Rosales is a 70 y.o. female with a hx of NICM s/p ICD, last EF 30-35%, parox A. Fib s/p DCCV 02/13/2020, hypertension, diabetes, CKD stage III, OSA who presents for follow-up.   Patient being seen for cardiomyopathy and medication titration.  She previously could not tolerate Entresto due to low blood pressures.  States doing okay, her fluid levels have been relatively stable.  Blood pressures improved since starting Entresto.  Her sister is undergoing treatment for breast cancer.  Denies palpitations, has no complaints or concerns today.  States not taking torsemide every day, roughly 4-5 times per week.  Prior notes device check on 11/12/2019 showed normal device functioning.   echocardiogram 12/08/2018 showed moderate to severely reduced ejection fraction, EF 30 to 35%.  Patient not sure how long she has been in atrial fibrillation.  States having low blood pressures at home with systolic in the 15Z, lisinopril was decreased to 10 mg.  On follow-up with PCP yesterday, lisinopril stopped due to systolics in the 20E.  She is supposed to be on a CPAP for OSA but has not been compliant.  Past Medical History:  Diagnosis Date  . 0223 Lead-Medtronic   . Anemia 11/12/2016  . Anxiety 12/25/2013  . Automatic implantable cardioverter-defibrillator in situ   . Breast cancer ([place]) 03/1995   right breast ca with lumpectomy and chemo and rad  . Cardiomyopathy, nonischemic ([place])   . Chronic kidney disease 11/12/2016   stage 3  . Depression   . Diabetes mellitus    Type II  Diabetes  . Hypertension   . Hypothyroidism   . ICD (implantable cardiac defibrillator), singleMedtronic   . NASH (nonalcoholic steatohepatitis)   . Personal history of chemotherapy 1996   right breast ca  . Personal history of radiation therapy 1996   right breast ca  . Sleep apnea    On CPAP  . Spleen enlarged   . Systolic heart failure, chronic ([place])     Past Surgical History:  Procedure Laterality Date  . ABDOMINAL HYSTERECTOMY    . BREAST BIOPSY Right 11/26/2015   - FAT NECROSIS, FIBROSIS, AND CHRONIC INFLAMMATION.   Marland Kitchen BREAST BIOPSY Right 12/20/2018   Affirm Bx- X-clip- path pending  . BREAST BIOPSY WITH RADIO FREQUENCY LOCALIZER Right 10/18/2019   Procedure: RF TAG GUIDED EXCISIONAL BREAST BIOPSY;  Surgeon: Benjamine Sprague, DO;  Location: [facility] ORS;  Service: General;  Laterality: Right;  . BREAST EXCISIONAL BIOPSY Right 03/1995   + breast ca chemo and rad. 9:00 region  . BREAST EXCISIONAL BIOPSY Right 2011   surgical bx neg  11:00 region  . BREAST LUMPECTOMY Right 1996  . CARDIOVERSION N/A 02/13/2020   Procedure: CARDIOVERSION;  Surgeon: Kate Sable, MD;  Location: [facility] ORS;  Service: Cardiovascular;  Laterality: N/A;  . CHOLECYSTECTOMY    . COLONOSCOPY  03/05/2013  . COLONOSCOPY W/ POLYPECTOMY  03/05/2013  . COLONOSCOPY WITH PROPOFOL N/A 10/03/2017   Procedure: COLONOSCOPY WITH PROPOFOL;  Surgeon: Manya Silvas, MD;  Location: [facility] ENDOSCOPY;  Service: Endoscopy;  Laterality: N/A;  . ESOPHAGOGASTRODUODENOSCOPY  01/11/2003, 06/25/2015  . ESOPHAGOGASTRODUODENOSCOPY (EGD) WITH PROPOFOL N/A 06/25/2015   Procedure: ESOPHAGOGASTRODUODENOSCOPY (EGD) WITH PROPOFOL;  Surgeon: Manya Silvas, MD;  Location: [facility] ENDOSCOPY;  Service: Endoscopy;  Laterality: N/A;  . FOOT SURGERY Right   . IMPLANTABLE CARDIOVERTER DEFIBRILLATOR (ICD) GENERATOR CHANGE N/A 11/15/2012   Procedure: ICD GENERATOR CHANGE;  Surgeon: Deboraha Sprang, MD;  Location: [facility] CATH LAB;  Service: Cardiovascular;   Laterality: N/A;  . JOINT REPLACEMENT  2009   Total Knee Replacement, Left  . LEAD REVISION N/A 11/15/2012   Procedure: LEAD REVISION;  Surgeon: Deboraha Sprang, MD;  Location: [facility] CATH LAB;  Service: Cardiovascular;  Laterality: N/A;  . PACEMAKER INSERTION  06/30/2005   Medtronic Virtuoso VR ICD - Nonischemic cardiomyopathy potentially related to adriamycin, class 3 congestive failture with a narrow QRS and a negative TDI  . TONSILLECTOMY    . TUBAL LIGATION      Current Medications: Current Meds  Medication Sig  . alendronate (FOSAMAX) 70 MG tablet Take 70 mg by mouth once a week.  Marland Kitchen apixaban (ELIQUIS) 5 MG TABS tablet Take 1 tablet (5 mg total) by mouth 2 (two) times daily.  . Calcium Carbonate-Vitamin D 600-400 MG-UNIT tablet Take 1 tablet by mouth 2 (two) times daily.    . Cinnamon 500 MG capsule Take 500 mg by mouth daily.  Marland Kitchen docusate sodium (COLACE) 100 MG capsule Take 300 mg by mouth at bedtime.   . DULoxetine (CYMBALTA) 30 MG capsule Take 30 mg by mouth daily.  Marland Kitchen gabapentin (NEURONTIN) 600 MG tablet Take 600 mg by mouth 2 (two) times daily.  Marland Kitchen glimepiride (AMARYL) 4 MG tablet Take 4 mg by mouth 2 (two) times daily before lunch and supper.   . levothyroxine (SYNTHROID, LEVOTHROID) 100 MCG tablet Take 100 mcg by mouth daily.   Marland Kitchen lisinopril (ZESTRIL) 2.5 MG tablet Take 1 tablet (2.5 mg total) by mouth daily.  . metoprolol succinate (TOPROL-XL) 25 MG 24 hr tablet Take 1 tablet (25 mg total) by mouth daily. Take with or immediately following a meal.  . Multiple Vitamin (MULTIVITAMIN WITH MINERALS) TABS tablet Take 1 tablet by mouth daily. Centrum Silver  . Multiple Vitamins-Minerals (EYE-VITE EXTRA PLUS LUTEIN PO) Take 1 tablet by mouth daily.  Marland Kitchen omeprazole (PRILOSEC) 20 MG capsule Take 20 mg by mouth daily.    . Semaglutide,0.25 or 0.5MG/DOS, (OZEMPIC, 0.25 OR 0.5 MG/DOSE,) 2 MG/1.5ML SOPN Inject 0.5 mg into the skin every Friday.   . [DISCONTINUED] torsemide (DEMADEX) 20 MG tablet  Take 1 tablet (20 mg total) by mouth daily. If you have a weight gain of 3 lbs in 1 day, or 5 lbs in 1 week, take 1 extra dose (20 MG).     Allergies:   Ibuprofen   Social History   Socioeconomic History  . Marital status: Widowed    Spouse name: Not on file  . Number of children: Not on file  . Years of education: Not on file  . Highest education level: Not on file  Occupational History  . Occupation: Full time    Employer: [employer]  Tobacco Use  . Smoking status: Never Smoker  . Smokeless tobacco: Never Used  Vaping Use  . Vaping Use: Never used  Substance and Sexual Activity  . Alcohol use: No  . Drug use: No  . Sexual activity: Not on file  Other Topics Concern  . Not on file  Social History Narrative  Married   Does not get regular exercise   Social Determinants of Health   Financial Resource Strain: Not on file  Food Insecurity: Not on file  Transportation Needs: Not on file  Physical Activity: Not on file  Stress: Not on file  Social Connections: Not on file     Family History: The patient's family history includes Prostate cancer in her father. There is no history of Breast cancer.  ROS:   Please see the history of present illness.     All other systems reviewed and are negative.  EKGs/Labs/Other Studies Reviewed:    The following studies were reviewed today:   EKG:  EKG is  ordered today.  The ekg ordered today demonstrates sinus rhythm, frequent PACs  Recent Labs: 01/29/2020: Hemoglobin 12.5; Platelets 150 03/06/2020: BUN 20; Creatinine, Ser 1.31; Potassium 3.8; Sodium 138  Recent Lipid Panel No results found for: CHOL, TRIG, HDL, CHOLHDL, VLDL, LDLCALC, LDLDIRECT   Risk Assessment/Calculations:     CHA2DS2-VASc Score = 5  This indicates a 7.2% annual risk of stroke. The patient's score is based upon: CHF History: Yes HTN History: Yes Diabetes History: Yes Stroke History: No Vascular Disease History: No Age Score: 1 Gender Score:  1     Physical Exam:    VS:  BP 124/74 (BP Location: Left Arm, Patient Position: Sitting, Cuff Size: Normal)   Pulse 98   Ht 4' 11"  (1.499 m)   Wt 203 lb 2 oz (92.1 kg)   SpO2 99%   BMI 41.03 kg/m     Wt Readings from Last 3 Encounters:  06/05/20 203 lb 2 oz (92.1 kg)  05/08/20 203 lb 4 oz (92.2 kg)  04/03/20 201 lb 2 oz (91.2 kg)     GEN:  Well nourished, well developed in no acute distress HEENT: Normal NECK: No JVD; No carotid bruits LYMPHATICS: No lymphadenopathy CARDIAC: Regular rate and rhythm, no murmurs RESPIRATORY: Lungs appear clear ABDOMEN: Soft, non-tender, non-distended MUSCULOSKELETAL:  No edema; No deformity  SKIN: Warm and dry NEUROLOGIC:  Alert and oriented x 3 PSYCHIATRIC:  Normal affect   ASSESSMENT:    1. Paroxysmal atrial fibrillation (HCC)   2. NICM (nonischemic cardiomyopathy) ([place])   3. ICD (implantable cardioverter-defibrillator) in place    PLAN:    In order of problems listed above:  1. Paroxysmal atrial fibrillation,  S/p DCCV 02/13/2020,maintaining sinus rhythm., CHA2DS2-VASc of 5.  Heart rate controlled.  Continue Toprol-XL, Eliquis 5 mg twice daily 2. Nonischemic cardiomyopathy, EF 30 to 35%.  Toprol-XL to 25 mg daily, start lisinopril 2.5 mg daily.  Increase torsemide 40 mg daily.  Medication compliance recommended. 3. ICD, keep appointment with device clinic for checks  Follow-up in 3 months,  Medication Adjustments/Labs and Tests Ordered: Current medicines are reviewed at length with the patient today.  Concerns regarding medicines are outlined above.  Orders Placed This Encounter  Procedures  . EKG 12-Lead   Meds ordered this encounter  Medications  . lisinopril (ZESTRIL) 2.5 MG tablet    Sig: Take 1 tablet (2.5 mg total) by mouth daily.    Dispense:  30 tablet    Refill:  2  . torsemide 40 MG TABS    Sig: Take 40 mg by mouth daily.    Dispense:  180 tablet    Refill:  3    Patient Instructions  Medication  Instructions:  Your physician has recommended you make the following change in your medication:   1.  INCREASE your Torsemide to  40 MG daily. 2.  START taking Lisinopril 2.5 MG daily.  *If you need a refill on your cardiac medications before your next appointment, please call your pharmacy*   Lab Work:  None ordered   Testing/Procedures:  None ordered   Follow-Up: At [facility], you and your health needs are our priority.  As part of our continuing mission to provide you with exceptional heart care, we have created designated Provider Care Teams.  These Care Teams include your primary Cardiologist (physician) and Advanced Practice Providers (APPs -  Physician Assistants and Nurse Practitioners) who all work together to provide you with the care you need, when you need it.  We recommend signing up for the patient portal called "MyChart".  Sign up information is provided on this After Visit Summary.  MyChart is used to connect with patients for Virtual Visits (Telemedicine).  Patients are able to view lab/test results, encounter notes, upcoming appointments, etc.  Non-urgent messages can be sent to your provider as well.   To learn more about what you can do with MyChart, go to [email or web address].    Your next appointment:   2-3 month(s)  The format for your next appointment:   In Person  Provider:   Kate Sable, MD   Other Instructions     Signed, Kate Sable, MD  06/05/2020 11:22 AM    [place]

## 2020-06-30 ENCOUNTER — Telehealth: Payer: Self-pay | Admitting: Internal Medicine

## 2020-06-30 DIAGNOSIS — I4729 Other ventricular tachycardia: Secondary | ICD-10-CM

## 2020-06-30 DIAGNOSIS — I472 Ventricular tachycardia: Secondary | ICD-10-CM

## 2020-06-30 NOTE — Telephone Encounter (Signed)
I called and spoke with the patient. I have advised her that Dr. [name] reviewed her most recent remote transmission showing she had episodes of NSVT.  She is aware of Dr. Olin Pia recommendations to: - obtain a BMP/ Magnesium level  I have asked her if she has ever been told she has CAD. Per the patient, she has never been told this.   She does state she feels her heart beat is not regulated at night when she is trying to put her C-PAP on.  She does confirm she is wearing this, but isn't sure if the settings are correct or may need to be adjusted. I have advised her to follow up with Dr. Raul Del if she is concerned with this.   She is due to see Dr. [name] in May, but I have advised her we will go ahead and see her in April if she is agreeable.  The patient states she will go on 07/07/20 for her lab work at [facility]. She will see Dr. [name] on 07/17/20 at 8:40 am.  I have advised her she may need further testing to potentially look for CAD, but this can be discussed with Dr. [name] at her office visit.  The patient voices understanding and is agreeable.

## 2020-06-30 NOTE — Telephone Encounter (Signed)
Katherine Sprang, MD  06/28/2020 4:31 PM EDT Back to Top     Remote reviewed. This remote is abnormal for VT polymorphic--can we please get a K and Mg, her chart says she has nonischemic cardiomyopathy  can you please make sure no hx of CAD Kavan Devan Thanks SK

## 2020-06-30 NOTE — Telephone Encounter (Signed)
Attempted to call the patient. No answer- I left a message to please call back.  

## 2020-06-30 NOTE — Telephone Encounter (Signed)
Patient returning call.

## 2020-07-04 NOTE — Telephone Encounter (Signed)
I called and spoke with the patient. She wanted to make sure her lab work was going to check her kidneys as well.  I advised her that the BMP will check her sodium, potassium, & kidney function. We will also be checking a magnesium as well.  The patient voices understanding and just wanted to keep a check on this due to her torsemide dose being adjusted by Dr. Garen Lah.  She was appreciative for the call back.  She will be coming to the [place] next week to have these done.

## 2020-07-04 NOTE — Telephone Encounter (Signed)
Patient calling Would like to know if upcoming labs will be checking her kidneys  Please call to discuss

## 2020-07-07 ENCOUNTER — Other Ambulatory Visit
Admission: RE | Admit: 2020-07-07 | Discharge: 2020-07-07 | Disposition: A | Discharge status: Home / Self Care | Payer: Medicare HMO | Attending: Internal Medicine | Admitting: Internal Medicine

## 2020-07-07 ENCOUNTER — Other Ambulatory Visit: Payer: Self-pay

## 2020-07-07 ENCOUNTER — Ambulatory Visit (INDEPENDENT_AMBULATORY_CARE_PROVIDER_SITE_OTHER): Payer: Medicare HMO

## 2020-07-07 DIAGNOSIS — I5022 Chronic systolic (congestive) heart failure: Secondary | ICD-10-CM

## 2020-07-07 DIAGNOSIS — Z95810 Presence of automatic (implantable) cardiac defibrillator: Secondary | ICD-10-CM

## 2020-07-07 DIAGNOSIS — I472 Ventricular tachycardia: Secondary | ICD-10-CM | POA: Insufficient documentation

## 2020-07-07 DIAGNOSIS — I4729 Other ventricular tachycardia: Secondary | ICD-10-CM

## 2020-07-07 LAB — BASIC METABOLIC PANEL
Anion gap: 8 (ref 5–15)
BUN: 22 mg/dL (ref 8–23)
CO2: 32 mmol/L (ref 22–32)
Calcium: 10.7 mg/dL — ABNORMAL HIGH (ref 8.9–10.3)
Chloride: 99 mmol/L (ref 98–111)
Creatinine, Ser: 1.28 mg/dL — ABNORMAL HIGH (ref 0.44–1.00)
GFR, Estimated: 45 mL/min — ABNORMAL LOW (ref >60)
Glucose, Bld: 125 mg/dL — ABNORMAL HIGH (ref 70–99)
Potassium: 3.8 mmol/L (ref 3.5–5.1)
Sodium: 139 mmol/L (ref 135–145)

## 2020-07-07 LAB — MAGNESIUM: Magnesium: 2.2 mg/dL (ref 1.7–2.4)

## 2020-07-07 NOTE — Progress Notes (Signed)
EPIC Encounter for ICM Monitoring  Patient Name: Katherine Rosales is a 70 y.o. female Date: 07/07/2020 Primary Care Physican: Dion Body, MD Primary Cardiologist:[place] Electrophysiologist: [name] 07/07/2020 Weight: 199lbs   Spoke with patient and reports feeling well at this time.  Denies fluid symptoms.    Optivol thoracic impedancesuggestingnormal fluid levels.  Prescribed:Torsemide20 mgtake 2 tablet (40 mg total) by mouth daily.   Labs: 03/06/2020 Creatinine1.31Jari Sportsman, Potassium3.8, [id number], QAS60 02/20/2020 Creatinine1.49, BUN20, Potassium3.6, [id number], [id number] 01/28/2020 Creatinine 1.28, BUN 18, Potassium 4.0, Sodium 137, GFR 45 01/09/2019 Creatinine 1.21, BUN 25, Potassium 4.1, Sodium 137, GFR 46-53 A complete set of results can be found in Results Review.  Recommendations: No changes and encouraged to call if experiencing any fluid symptoms.  Follow-up plan: ICM clinic phone appointment on5/01/2021.91 day device clinic remote transmission5/12/2020.  EP/Cardiology Office Visits:07/17/2020 with Dr [name].  6/30/2022with Dr Garen Lah.  Copy of ICM check sent to [place]   3 month ICM trend: 07/07/2020.    1 Year ICM trend:       [name], RN 07/07/2020 11:30 AM

## 2020-07-09 DIAGNOSIS — E119 Type 2 diabetes mellitus without complications: Secondary | ICD-10-CM | POA: Diagnosis not present

## 2020-07-14 ENCOUNTER — Telehealth: Payer: Self-pay | Admitting: Internal Medicine

## 2020-07-14 DIAGNOSIS — H5213 Myopia, bilateral: Secondary | ICD-10-CM | POA: Diagnosis not present

## 2020-07-14 DIAGNOSIS — Z0100 Encounter for examination of eyes and vision without abnormal findings: Secondary | ICD-10-CM | POA: Diagnosis not present

## 2020-07-14 NOTE — Telephone Encounter (Signed)
Katherine Sprang, MD  07/12/2020 4:16 PM EDT      Please Inform Patient  Labs are normal x CA elevated she needs an ionized Ca level and then if abnormal will need to get workup with her PCP   Thanks

## 2020-07-14 NOTE — Telephone Encounter (Signed)
Attempted to call the patient. No answer- I left a detailed message of results on her identified voice mail (ok per DPR).  I advised we will plan to draw an ionized calcium level at her follow up appointment with Dr. [name] on 07/17/20.  I asked that she call back with any questions/ concerns prior to her appointment.

## 2020-07-17 ENCOUNTER — Ambulatory Visit: Payer: Medicare HMO | Admitting: Internal Medicine

## 2020-07-17 ENCOUNTER — Ambulatory Visit (INDEPENDENT_AMBULATORY_CARE_PROVIDER_SITE_OTHER): Payer: Medicare HMO

## 2020-07-17 ENCOUNTER — Other Ambulatory Visit: Payer: Self-pay

## 2020-07-17 ENCOUNTER — Encounter: Payer: Self-pay | Admitting: Internal Medicine

## 2020-07-17 VITALS — BP 114/70 | HR 91 | Ht 59.0 in | Wt 199.0 lb

## 2020-07-17 DIAGNOSIS — Z95810 Presence of automatic (implantable) cardiac defibrillator: Secondary | ICD-10-CM | POA: Diagnosis not present

## 2020-07-17 DIAGNOSIS — I493 Ventricular premature depolarization: Secondary | ICD-10-CM

## 2020-07-17 DIAGNOSIS — I5022 Chronic systolic (congestive) heart failure: Secondary | ICD-10-CM | POA: Diagnosis not present

## 2020-07-17 DIAGNOSIS — I472 Ventricular tachycardia, unspecified: Secondary | ICD-10-CM

## 2020-07-17 DIAGNOSIS — I428 Other cardiomyopathies: Secondary | ICD-10-CM | POA: Diagnosis not present

## 2020-07-17 DIAGNOSIS — I4729 Other ventricular tachycardia: Secondary | ICD-10-CM

## 2020-07-17 DIAGNOSIS — I480 Paroxysmal atrial fibrillation: Secondary | ICD-10-CM | POA: Diagnosis not present

## 2020-07-17 MED ORDER — CARVEDILOL 6.25 MG PO TABS: 6.2500 mg | ORAL_TABLET | Freq: Two times a day (BID) | ORAL | 1 refills | Status: DC

## 2020-07-17 MED ORDER — SPIRONOLACTONE 25 MG PO TABS: 12.5000 mg | ORAL_TABLET | Freq: Every day | ORAL | 1 refills | Status: DC

## 2020-07-17 NOTE — Progress Notes (Signed)
Patient Care Team: Dion Body, MD as PCP - General (Family Medicine) Deboraha Sprang, MD as PCP - Cardiology (Cardiology)   HPI  Katherine Rosales is a 70 y.o. female Seen in followup for Medtronic  ICD implanted for nonischemic cardiomyopathy and primary prevention. She has chronic systolic failure. Generator replacement in 8/14.  10/21 acute heart failure associated with atrial fibrillation with a rapid rate.  Temporally related to discontinuing her CPAP Now anticoagulated with Eliquis without bleeding  Struggles with increasing fatigue and shortness of breath but no edema.  Denies chest pain.  Has not felt good since her episode with atrial fibrillation not withstanding that she has been in sinus rhythm and has been compliant with CPAP.   Date Cr K Hgb  8/19 1.6 4.5 11.7  3/20 1/2  4.4 11.9  10/20 1.2 4.1 11.6  5/21 1.3 4.6 11/.8 (2/21)  10/21 1.28 4.1 12.5  4/22 1.28 3.8      DATE TEST EF   6/14 Echo   20-25 %   9/20 Echo   30-35 %            Past Medical History:  Diagnosis Date  . 9675 Lead-Medtronic   . Anemia 11/12/2016  . Anxiety 12/25/2013  . Automatic implantable cardioverter-defibrillator in situ   . Breast cancer ([place]) 03/1995   right breast ca with lumpectomy and chemo and rad  . Cardiomyopathy, nonischemic ([place])   . Chronic kidney disease 11/12/2016   stage 3  . Depression   . Diabetes mellitus    Type II Diabetes  . Hypertension   . Hypothyroidism   . ICD (implantable cardiac defibrillator), singleMedtronic   . NASH (nonalcoholic steatohepatitis)   . Personal history of chemotherapy 1996   right breast ca  . Personal history of radiation therapy 1996   right breast ca  . Sleep apnea    On CPAP  . Spleen enlarged   . Systolic heart failure, chronic ([place])     Past Surgical History:  Procedure Laterality Date  . ABDOMINAL HYSTERECTOMY    . BREAST BIOPSY Right 11/26/2015   - FAT NECROSIS, FIBROSIS, AND CHRONIC INFLAMMATION.   Marland Kitchen BREAST  BIOPSY Right 12/20/2018   Affirm Bx- X-clip- path pending  . BREAST BIOPSY WITH RADIO FREQUENCY LOCALIZER Right 10/18/2019   Procedure: RF TAG GUIDED EXCISIONAL BREAST BIOPSY;  Surgeon: Benjamine Sprague, DO;  Location: [facility] ORS;  Service: General;  Laterality: Right;  . BREAST EXCISIONAL BIOPSY Right 03/1995   + breast ca chemo and rad. 9:00 region  . BREAST EXCISIONAL BIOPSY Right 2011   surgical bx neg  11:00 region  . BREAST LUMPECTOMY Right 1996  . CARDIOVERSION N/A 02/13/2020   Procedure: CARDIOVERSION;  Surgeon: Kate Sable, MD;  Location: [facility] ORS;  Service: Cardiovascular;  Laterality: N/A;  . CHOLECYSTECTOMY    . COLONOSCOPY  03/05/2013  . COLONOSCOPY W/ POLYPECTOMY  03/05/2013  . COLONOSCOPY WITH PROPOFOL N/A 10/03/2017   Procedure: COLONOSCOPY WITH PROPOFOL;  Surgeon: Manya Silvas, MD;  Location: [facility] ENDOSCOPY;  Service: Endoscopy;  Laterality: N/A;  . ESOPHAGOGASTRODUODENOSCOPY  01/11/2003, 06/25/2015  . ESOPHAGOGASTRODUODENOSCOPY (EGD) WITH PROPOFOL N/A 06/25/2015   Procedure: ESOPHAGOGASTRODUODENOSCOPY (EGD) WITH PROPOFOL;  Surgeon: Manya Silvas, MD;  Location[facility] ENDOSCOPY;  Service: Endoscopy;  Laterality: N/A;  . FOOT SURGERY Right   . IMPLANTABLE CARDIOVERTER DEFIBRILLATOR (ICD) GENERATOR CHANGE N/A 11/15/2012   Procedure: ICD GENERATOR CHANGE;  Surgeon: Deboraha Sprang, MD;  Location: [facility] C[facility] LAB;  Service: Cardiovascular;  Laterality: N/A;  . JOINT REPLACEMENT  2009   Total Knee Replacement, Left  . LEAD REVISION N/A 11/15/2012   Procedure: LEAD REVISION;  Surgeon: Deboraha Sprang, MD;  Location: [facility] CATH LAB;  Service: Cardiovascular;  Laterality: N/A;  . PACEMAKER INSERTION  06/30/2005   Medtronic Virtuoso VR ICD - Nonischemic cardiomyopathy potentially related to adriamycin, class 3 congestive failture with a narrow QRS and a negative TDI  . TONSILLECTOMY    . TUBAL LIGATION      Current Outpatient Medications  Medication Sig Dispense Refill  . alendronate  (FOSAMAX) 70 MG tablet Take 70 mg by mouth once a week.    Marland Kitchen apixaban (ELIQUIS) 5 MG TABS tablet Take 1 tablet (5 mg total) by mouth 2 (two) times daily. 60 tablet 6  . Ascorbic Acid (VITAMIN C) 1000 MG tablet Take 1,000 mg by mouth daily.    . Calcium Carbonate-Vitamin D 600-400 MG-UNIT tablet Take 1 tablet by mouth 2 (two) times daily.      . Cinnamon 500 MG capsule Take 1,000 mg by mouth daily.    Marland Kitchen docusate sodium (COLACE) 100 MG capsule Take 300 mg by mouth at bedtime.     . DULoxetine (CYMBALTA) 30 MG capsule Take 30 mg by mouth daily.    Marland Kitchen ELDERBERRY PO Take 1 Dose by mouth daily.    Marland Kitchen gabapentin (NEURONTIN) 600 MG tablet Take 600 mg by mouth 2 (two) times daily.    Marland Kitchen glimepiride (AMARYL) 4 MG tablet Take 4 mg by mouth 2 (two) times daily before lunch and supper.     . levothyroxine (SYNTHROID, LEVOTHROID) 100 MCG tablet Take 100 mcg by mouth daily.     Marland Kitchen lisinopril (ZESTRIL) 2.5 MG tablet Take 1 tablet (2.5 mg total) by mouth daily. 30 tablet 2  . metoprolol succinate (TOPROL-XL) 25 MG 24 hr tablet Take 1 tablet (25 mg total) by mouth daily. Take with or immediately following a meal. 30 tablet 5  . Multiple Vitamin (MULTIVITAMIN WITH MINERALS) TABS tablet Take 1 tablet by mouth daily. Centrum Silver    . Multiple Vitamins-Minerals (EYE-VITE EXTRA PLUS LUTEIN PO) Take 1 tablet by mouth daily.    . Multiple Vitamins-Minerals (ZINC PO) Take 1 Dose by mouth daily.    Marland Kitchen omeprazole (PRILOSEC) 20 MG capsule Take 20 mg by mouth daily.      . Semaglutide,0.25 or 0.5MG/DOS, (OZEMPIC, 0.25 OR 0.5 MG/DOSE,) 2 MG/1.5ML SOPN Inject 0.5 mg into the skin every Friday.     . torsemide 40 MG TABS Take 40 mg by mouth daily. 180 tablet 3   No current facility-administered medications for this visit.    Allergies  Allergen Reactions  . Ibuprofen Other (See Comments)    Other reaction(s): Liver Disorder     Review of Systems negative except from HPI and PMH  Physical Exam BP 114/70   Pulse 91    Ht 4' 11"  (1.499 m)   Wt 199 lb (90.3 kg)   BMI 40.19 kg/m   Well developed and well nourished in no acute distress HENT normal Neck supple   Clear Device pocket well healed; without hematoma or erythema.  There is no tethering  Regular rate and rhythm, no murmur Abd-soft with active BS No Clubbing cyanosis  edema Skin-warm and dry A & Oriented  Grossly normal sensory and motor function  ECG sinus at 91 Interval 18/12/41 PVCs-frequent   Assessment and  Plan  Nonischemic cardiomyopathy  Heart failure chronic systolic  Hypertension  PVCs/VT nonsustained-increased burden  Implantable defibrillator-Medtronic        Sleep apnea  -- compliant  Atrial fibrillation-persistent holding sinus rhythm     Prolonged episode of nonsustained ventricular tachycardia prompting detection but no therapy; this is associated with increased nonsustained VT burden and increased palpitations with frequent PVCs identified on her electrocardiogram.  Potassium was recently normal.  Will check magnesium as the VT was polymorphic.  Unlikely that this is ischemic with prior negative catheterization.  However may still need evaluation.  Will check magnesium   Euvolemic continue current meds  With her known cardiomyopathy, but worsening symptoms, concerned about interval deterioration in left ventricular function especially given the high burden of ventricular ectopy   which could be both causative as well as caused by changing left ventricular function.  With her cardiomyopathy, we will resume her spironolactone at 12.5 mg daily.  We will transition her back from metoprolol--carvedilol.  If she has sufficient blood pressure, we will transition her from lisinopril--Entresto.  She may also benefit from an SGLT2, but 1 thing at a time.

## 2020-07-17 NOTE — Patient Instructions (Addendum)
Medication Instructions:  - Your physician has recommended you make the following change in your medication:   1) STOP metoprolol succinate  2) START coreg (carvedilol) 6.25 mg- take 1 tablet by mouth twice daily   3) START spironolactone 25 mg- take 0.5 tablet (12.5 mg) by mouth once daily    *If you need a refill on your cardiac medications before your next appointment, please call your pharmacy*   Lab Work: Your physician recommends that you return for lab work today: Magnesium/ Ionized Calcium  If you have labs (blood work) drawn today and your tests are completely normal, you will receive your results only by: [facility] MyChart Message (if you have MyChart) OR . A paper copy in the mail If you have any lab test that is abnormal or we need to change your treatment, we will call you to review the results.   Testing/Procedures: Your physician has requested that you have an echocardiogram (after 07/31/20). Echocardiography is a painless test that uses sound waves to create images of your heart. It provides your doctor with information about the size and shape of your heart and how well your heart's chambers and valves are working. This procedure takes approximately one hour. There are no restrictions for this procedure.  ZIO XT- Long Term Monitor Instructions   Your physician has requested you wear a ZIO patch monitor for _14__ days.   This monitor is a medical device that records the heart's electrical activity. Doctors most often use these monitors to diagnose arrhythmias. Arrhythmias are problems with the speed or rhythm of the heartbeat. The monitor is a small device applied to your chest. You can wear one while you do your normal daily activities. While wearing this monitor if you have any symptoms to push the button and record what you felt. Once you have worn this monitor for the period of time provider prescribed (Usually 14 days), you will return the monitor device in the postage paid  box. Once it is returned they will download the data collected and provide [place] with a report which the provider will then review and we will call you with those results. Important tips:  1. Avoid showering during the first 24 hours of wearing the monitor. 2. Avoid excessive sweating to help maximize wear time. 3. Do not submerge the device, no hot tubs, and no swimming pools. 4. Keep any lotions or oils away from the patch. 5. After 24 hours you may shower with the patch on. Take brief showers with your back facing the shower head.  6. Do not remove patch once it has been placed because that will interrupt data and decrease adhesive wear time. 7. Push the button when you have any symptoms and write down what you were feeling. 8. Once you have completed wearing your monitor, remove and place into box which has postage paid and place in your outgoing mailbox.  9. If for some reason you have misplaced your box then call our office and we can provide another box and/or mail it off for you.     ?  Follow-Up: At [facility], you and your health needs are our priority.  As part of our continuing mission to provide you with exceptional heart care, we have created designated Provider Care Teams.  These Care Teams include your primary Cardiologist (physician) and Advanced Practice Providers (APPs -  Physician Assistants and Nurse Practitioners) who all work together to provide you with the care you need, when you need it.  We recommend signing up for the patient portal called "MyChart".  Sign up information is provided on this After Visit Summary.  MyChart is used to connect with patients for Virtual Visits (Telemedicine).  Patients are able to view lab/test results, encounter notes, upcoming appointments, etc.  Non-urgent messages can be sent to your provider as well.   To learn more about what you can do with MyChart, go to [email or web address].    Your next appointment:   June 2022 (we will  cancel your appointment with Dr. Garen Lah for June)  The format for your next appointment:   In Person  Provider:   [name], MD     Other Instructions

## 2020-07-19 LAB — MAGNESIUM: Magnesium: 2 mg/dL (ref 1.6–2.3)

## 2020-07-19 LAB — CALCIUM, IONIZED: Calcium, Ion: 5.3 mg/dL (ref 4.5–5.6)

## 2020-07-31 DIAGNOSIS — I472 Ventricular tachycardia: Secondary | ICD-10-CM | POA: Diagnosis not present

## 2020-08-06 ENCOUNTER — Encounter: Payer: Self-pay | Admitting: *Deleted

## 2020-08-07 ENCOUNTER — Other Ambulatory Visit: Payer: Self-pay | Admitting: Internal Medicine

## 2020-08-07 NOTE — Telephone Encounter (Signed)
34f 90.3kg, scr 1.28 07/07/20, lovw/klein 07/17/20

## 2020-08-07 NOTE — Telephone Encounter (Signed)
Refill request

## 2020-08-08 DIAGNOSIS — I472 Ventricular tachycardia: Secondary | ICD-10-CM | POA: Diagnosis not present

## 2020-08-11 ENCOUNTER — Ambulatory Visit (INDEPENDENT_AMBULATORY_CARE_PROVIDER_SITE_OTHER): Payer: Medicare HMO

## 2020-08-11 DIAGNOSIS — I472 Ventricular tachycardia, unspecified: Secondary | ICD-10-CM

## 2020-08-11 DIAGNOSIS — I428 Other cardiomyopathies: Secondary | ICD-10-CM | POA: Diagnosis not present

## 2020-08-12 ENCOUNTER — Ambulatory Visit (INDEPENDENT_AMBULATORY_CARE_PROVIDER_SITE_OTHER): Payer: Medicare HMO

## 2020-08-12 DIAGNOSIS — I5022 Chronic systolic (congestive) heart failure: Secondary | ICD-10-CM | POA: Diagnosis not present

## 2020-08-12 DIAGNOSIS — Z95810 Presence of automatic (implantable) cardiac defibrillator: Secondary | ICD-10-CM

## 2020-08-12 LAB — CUP PACEART REMOTE DEVICE CHECK
Battery Remaining Longevity: 43 mo
Battery Voltage: 2.97 V
Brady Statistic RV Percent Paced: 0.86 %
Date Time Interrogation Session: 20220509093524
HighPow Impedance: 79 Ohm
Implantable Lead Implant Date: 20140813
Implantable Lead Location: 753860
Implantable Lead Model: 6935
Implantable Pulse Generator Implant Date: 20140813
Lead Channel Impedance Value: 323 Ohm
Lead Channel Impedance Value: 399 Ohm
Lead Channel Pacing Threshold Amplitude: 0.875 V
Lead Channel Pacing Threshold Pulse Width: 0.4 ms
Lead Channel Sensing Intrinsic Amplitude: 14.75 mV
Lead Channel Sensing Intrinsic Amplitude: 14.75 mV
Lead Channel Setting Pacing Amplitude: 2 V
Lead Channel Setting Pacing Pulse Width: 0.4 ms
Lead Channel Setting Sensing Sensitivity: 0.3 mV

## 2020-08-12 NOTE — Progress Notes (Signed)
EPIC Encounter for ICM Monitoring  Patient Name: Katherine Rosales is a 70 y.o. female Date: 08/12/2020 Primary Care Physican: Dion Body, MD Primary [place] Electrophysiologist: [name] 07/07/2020 Weight: 199lbs     Spoke with patient and reports feeling well at this time.  Denies fluid symptoms.    Optivol thoracic impedancesuggestingpossible fluid accumulation starting 4/14 and improving 5/3 with return to baseline 5/9.  Prescribed:Torsemide20 mgtake 2 tablet (40 mg total) by mouth daily.   Labs: 07/07/2020 Creatinine 1.28, BUN 22, Potassium 3.8, Sodium 139, GFR 45 A complete set of results can be found in Results Review.  Recommendations: No changes and encouraged to call if experiencing any fluid symptoms.  Follow-up plan: ICM clinic phone appointment on6/20/2022.91 day device clinic remote transmission8/11/2020.  EP/Cardiology Office Visits:09/23/2020 with Dr [name].   Copy of ICM check sent to [place]   3 month ICM trend: 08/11/2020.    1 Year ICM trend:       [name], RN 08/12/2020 9:27 AM

## 2020-08-14 ENCOUNTER — Other Ambulatory Visit: Payer: Medicare HMO

## 2020-08-18 ENCOUNTER — Other Ambulatory Visit: Payer: Self-pay

## 2020-08-18 ENCOUNTER — Ambulatory Visit (INDEPENDENT_AMBULATORY_CARE_PROVIDER_SITE_OTHER): Payer: Medicare HMO

## 2020-08-18 DIAGNOSIS — I5022 Chronic systolic (congestive) heart failure: Secondary | ICD-10-CM

## 2020-08-18 DIAGNOSIS — I428 Other cardiomyopathies: Secondary | ICD-10-CM | POA: Diagnosis not present

## 2020-08-18 DIAGNOSIS — I472 Ventricular tachycardia, unspecified: Secondary | ICD-10-CM

## 2020-08-18 MED ORDER — PERFLUTREN LIPID MICROSPHERE
1.0000 mL | INTRAVENOUS | Status: AC | PRN
  Administered 2020-08-18: 2 mL via INTRAVENOUS

## 2020-08-19 ENCOUNTER — Telehealth: Payer: Self-pay | Admitting: Internal Medicine

## 2020-08-19 LAB — ECHOCARDIOGRAM COMPLETE
AR max vel: 2.13 cm2
AV Area VTI: 1.82 cm2
AV Area mean vel: 1.92 cm2
AV Mean grad: 2.7 mmHg
AV Peak grad: 4.2 mmHg
Ao pk vel: 1.03 m/s

## 2020-08-19 NOTE — Telephone Encounter (Signed)
I spoke with the patient. I advised her that her monitor is back, but not yet read by Dr. [name]. She had her echo yesterday, but this has not been signed off by the reading doctor.  The patient states she just hasn't felt well since she was diagnosed with atrial fibrillation back in October 2021.  I have advised her I will call her back by Thursday at the latest with the results of both her echo & monitor.  The patient states her SBP doesn't run > 110, but this has been for some time.  I will forward this message to Dr. [name] to review both studies when available.  Her follow up with Dr. [name] is on 09/23/20.

## 2020-08-19 NOTE — Telephone Encounter (Signed)
  1. Is this related to a heart monitor you are wearing?  (If the patient says no, please ask     if they are caling about ICD/pacemaker.) No  2. What is your issue??  Calling for results   Please route to covering RN/CMA/RMA for results. Route to monitor technicians or your monitor tech representative for your site for any technical concerns

## 2020-08-22 ENCOUNTER — Telehealth: Payer: Self-pay | Admitting: Internal Medicine

## 2020-08-22 NOTE — Telephone Encounter (Signed)
Elissa Hefty  Clinical Support Staff    Telephone Encounter  Signed  Creation Time:  08/22/2020 12:00 PM           Signed        Please call to discuss monitor and echocardiogram results.

## 2020-08-22 NOTE — Telephone Encounter (Signed)
Please call to discuss monitor and echocardiogram results.

## 2020-08-22 NOTE — Telephone Encounter (Signed)
Echo results: Katherine Sprang, MD  08/22/2020 12:58 PM EDT Back to Top     Please Inform Patient Echo showed I interval worsening of heart muscle function      Monitor results: Rec Total PVC burden about 8.1 %  Prob not likely to be the cause Her BP makes med changes a little bit of a challenge so  1) decrease carvedilol from 6>>3.125 2) stop lisinopril 3) begin entresto in 36 hr 24/26   4) have her increase her torsemide to 80 qod x 3 doses then resume 40 daily  We will have to see how she tolerated

## 2020-08-22 NOTE — Telephone Encounter (Signed)
I advised Dr. [name] on 08/21/20 to please review the patient's echo/ monitor. I have also messaged him again this morning to please review.  Waiting on MD to let me know his thoughts/ recommendations regarding both of these test.

## 2020-08-22 NOTE — Telephone Encounter (Signed)
See previous phone encounter for the same issue. Closing this encounter.

## 2020-08-25 MED ORDER — ENTRESTO 24-26 MG PO TABS: 1.0000 | ORAL_TABLET | Freq: Two times a day (BID) | ORAL | 2 refills | Status: DC

## 2020-08-25 MED ORDER — CARVEDILOL 3.125 MG PO TABS: 3.1250 mg | ORAL_TABLET | Freq: Two times a day (BID) | ORAL | 5 refills | Status: DC

## 2020-08-25 NOTE — Addendum Note (Signed)
Addended by: Lamar Laundry on: 08/25/2020 10:29 AM   Modules accepted: Orders

## 2020-08-25 NOTE — Telephone Encounter (Signed)
Spoke with the patient. Patient made aware of echo and monitor results. Reviewed Dr. Olin Pia recommendations with her over the phone.  Patient sts that she is currently at the [facility] cancer center with her sister. Patient request that the medication instructions be written out and left at our front desk for her to pick up today. Letter left at the front desk for the patient to pick up.  Adv the patient that once reviewed if she has any questions she should call the office.  Patient verbalized understanding and voiced appreciation for the call.

## 2020-08-26 ENCOUNTER — Telehealth: Payer: Self-pay

## 2020-08-26 NOTE — Telephone Encounter (Signed)
Patient would like to know if there are any coupons available for [facility] [facility]. States she is in the donut hole.

## 2020-08-26 NOTE — Telephone Encounter (Signed)
I have placed coupons and patient assistance program application upfront for pt to pick up. Pt is aware that she can attempt to use coupons with her pharmacy to see if she can get a free 30 day/$10 co pay card. Pt is unable to afford Entresto at this time she is currently in the donut hole. Pt will reapply for Pt assistance, pt mentioned that she has tried in the past and mentioned that she never qualifies due to income requirement but will try and see if she meets the requirements this year and pt will return application to office to be submitted.

## 2020-09-02 NOTE — Progress Notes (Signed)
Remote ICD transmission.   

## 2020-09-11 DIAGNOSIS — E039 Hypothyroidism, unspecified: Secondary | ICD-10-CM | POA: Diagnosis not present

## 2020-09-11 DIAGNOSIS — E1143 Type 2 diabetes mellitus with diabetic autonomic (poly)neuropathy: Secondary | ICD-10-CM | POA: Diagnosis not present

## 2020-09-11 DIAGNOSIS — D693 Immune thrombocytopenic purpura: Secondary | ICD-10-CM | POA: Diagnosis not present

## 2020-09-11 NOTE — Telephone Encounter (Signed)
Patient dropped off [employer] application placed in nurse box.

## 2020-09-18 DIAGNOSIS — D693 Immune thrombocytopenic purpura: Secondary | ICD-10-CM | POA: Diagnosis not present

## 2020-09-18 DIAGNOSIS — Z0000 Encounter for general adult medical examination without abnormal findings: Secondary | ICD-10-CM | POA: Diagnosis not present

## 2020-09-18 DIAGNOSIS — F32A Depression, unspecified: Secondary | ICD-10-CM | POA: Diagnosis not present

## 2020-09-18 DIAGNOSIS — E119 Type 2 diabetes mellitus without complications: Secondary | ICD-10-CM | POA: Diagnosis not present

## 2020-09-18 DIAGNOSIS — E669 Obesity, unspecified: Secondary | ICD-10-CM | POA: Diagnosis not present

## 2020-09-18 DIAGNOSIS — N189 Chronic kidney disease, unspecified: Secondary | ICD-10-CM | POA: Diagnosis not present

## 2020-09-19 NOTE — Telephone Encounter (Signed)
Application faxed 4/98/26.

## 2020-09-22 ENCOUNTER — Ambulatory Visit (INDEPENDENT_AMBULATORY_CARE_PROVIDER_SITE_OTHER): Payer: Medicare HMO

## 2020-09-22 DIAGNOSIS — Z95810 Presence of automatic (implantable) cardiac defibrillator: Secondary | ICD-10-CM

## 2020-09-22 DIAGNOSIS — I5022 Chronic systolic (congestive) heart failure: Secondary | ICD-10-CM | POA: Diagnosis not present

## 2020-09-22 NOTE — Progress Notes (Signed)
EPIC Encounter for ICM Monitoring  Patient Name: Katherine Rosales is a 70 y.o. female Date: 09/22/2020 Primary Care Physican: Dion Body, MD Primary Cardiologist: [name] Electrophysiologist: [name] 09/22/2020 Weight 194 lbs     Spoke with patient and reports she gets Katherine Rosales of breath with activities.  She has seen the pulmonologist....    Optivol thoracic impedance normal but was suggesting possible fluid accumulation intermittently within the last 2-3 weeks.   Prescribed: Torsemide 40 mg take 1 tablet (40 mg total) by mouth daily.   Labs: 07/07/2020 Creatinine 1.28, BUN 22, Potassium 3.8, Sodium 139, GFR 45 A complete set of results can be found in Results Review.   Recommendations:  No changes and encouraged to call if experiencing any fluid symptoms.   Follow-up plan: ICM clinic phone appointment on 10/27/2020.  91 day device clinic remote transmission 11/10/2020.    EP/Cardiology Office Visits: 09/23/2020 with Dr [name].   Copy of ICM check sent to Dr. [name]    3 month ICM trend: 09/22/2020.    1 Year ICM trend:       Katherine Billings, RN 09/22/2020 4:27 PM

## 2020-09-23 ENCOUNTER — Other Ambulatory Visit: Payer: Self-pay

## 2020-09-23 ENCOUNTER — Ambulatory Visit (INDEPENDENT_AMBULATORY_CARE_PROVIDER_SITE_OTHER): Payer: Medicare HMO | Admitting: Internal Medicine

## 2020-09-23 ENCOUNTER — Encounter: Payer: Self-pay | Admitting: Internal Medicine

## 2020-09-23 VITALS — BP 108/62 | HR 86 | Ht 59.0 in | Wt 200.0 lb

## 2020-09-23 DIAGNOSIS — I428 Other cardiomyopathies: Secondary | ICD-10-CM | POA: Diagnosis not present

## 2020-09-23 DIAGNOSIS — I493 Ventricular premature depolarization: Secondary | ICD-10-CM | POA: Diagnosis not present

## 2020-09-23 DIAGNOSIS — I4819 Other persistent atrial fibrillation: Secondary | ICD-10-CM

## 2020-09-23 DIAGNOSIS — I472 Ventricular tachycardia, unspecified: Secondary | ICD-10-CM

## 2020-09-23 DIAGNOSIS — Z95810 Presence of automatic (implantable) cardiac defibrillator: Secondary | ICD-10-CM

## 2020-09-23 DIAGNOSIS — I5022 Chronic systolic (congestive) heart failure: Secondary | ICD-10-CM | POA: Diagnosis not present

## 2020-09-23 NOTE — Progress Notes (Signed)
Patient ID: Katherine Rosales, female   DOB: 04/14/1950, 70 y.o.   MRN: [id number] Patient Care Team: Dion Body, MD as PCP - General (Family Medicine) Deboraha Sprang, MD as PCP - Cardiology (Cardiology)   HPI  Katherine Rosales is a 70 y.o. female Seen in followup for Medtronic  ICD implanted for nonischemic cardiomyopathy and primary prevention. She has chronic systolic failure. Generator replacement in 8/14.  10/21 acute heart failure associated with atrial fibrillation with a rapid rate.  Temporally related to discontinuing her CPAP  Now anticoagulated with Eliquis without  bleeding  Seen 4/22 with increasing burden of nonsustained ventricular tachycardia.  Resumed her Aldactone 12.5 and transition her back from metoprolol--carvedilol.  In the interval started her on Entresto and she is considerably better.     Today, the patient denies chest pain, nocturnal dyspnea, orthopnea or peripheral edema.  There have been no palpitations, lightheadedness or syncope.  Complains of mild exertional shortness of breath but improved since initiation of Entresto; her energy and fatigue is also better.  Noted by ECG to be in atrial fibrillation.  No awareness  She inquired about a medication assistance    She notes she doesn't use her Cpap at night   Date Cr K Hgb  8/19 1.6 4.5 11.7  3/20 1/2  4.4 11.9  10/20 1.2 4.1 11.6  5/21 1.3 4.6 (2/21) 11.8   12/21 1.31 3.8 (10/21)12.5  4/22 1.28 3.8       DATE TEST EF%   6/14 Echo   20-25 %   9/20 Echo   30-35 %   5/22 Echo 20-25%       Past Medical History:  Diagnosis Date   6949 Lead-Medtronic    Anemia 11/12/2016   Anxiety 12/25/2013   Automatic implantable cardioverter-defibrillator in situ    Breast cancer ([place]) 03/1995   right breast ca with lumpectomy and chemo and rad   Cardiomyopathy, nonischemic ([place])    Chronic kidney disease 11/12/2016   stage 3   Depression    Diabetes mellitus    Type II Diabetes   Hypertension     Hypothyroidism    ICD (implantable cardiac defibrillator), singleMedtronic    NASH (nonalcoholic steatohepatitis)    Personal history of chemotherapy 1996   right breast ca   Personal history of radiation therapy 1996   right breast ca   Sleep apnea    On CPAP   Spleen enlarged    Systolic heart failure, chronic ([place])     Past Surgical History:  Procedure Laterality Date   ABDOMINAL HYSTERECTOMY     BREAST BIOPSY Right 11/26/2015   - FAT NECROSIS, FIBROSIS, AND CHRONIC INFLAMMATION.    BREAST BIOPSY Right 12/20/2018   Affirm Bx- X-clip- path pending   BREAST BIOPSY WITH RADIO FREQUENCY LOCALIZER Right 10/18/2019   Procedure: RF TAG GUIDED EXCISIONAL BREAST BIOPSY;  Surgeon: Benjamine Sprague, DO;  Location: [facility] ORS;  Service: General;  Laterality: Right;   BREAST EXCISIONAL BIOPSY Right 03/1995   + breast ca chemo and rad. 9:00 region   BREAST EXCISIONAL BIOPSY Right 2011   surgical bx neg  11:00 region   BREAST LUMPECTOMY Right 1996   CARDIOVERSION N/A 02/13/2020   Procedure: CARDIOVERSION;  Surgeon: Kate Sable, MD;  Location: [facility] ORS;  Service: Cardiovascular;  Laterality: N/A;   CHOLECYSTECTOMY     COLONOSCOPY  03/05/2013   COLONOSCOPY W/ POLYPECTOMY  03/05/2013   COLONOSCOPY WITH PROPOFOL N/A 10/03/2017   Procedure: COLONOSCOPY WITH PROPOFOL;  Surgeon: Manya Silvas, MD;  Location[facility] ENDOSCOPY;  Service: Endoscopy;  Laterality: N/A;   ESOPHAGOGASTRODUODENOSCOPY  01/11/2003, 06/25/2015   ESOPHAGOGASTRODUODENOSCOPY (EGD) WITH PROPOFOL N/A 06/25/2015   Procedure: ESOPHAGOGASTRODUODENOSCOPY (EGD) WITH PROPOFOL;  Surgeon: Manya Silvas, MD;  Location: [facility] ENDOSCOPY;  Service: Endoscopy;  Laterality: N/A;   FOOT SURGERY Right    IMPLANTABLE CARDIOVERTER DEFIBRILLATOR (ICD) GENERATOR CHANGE N/A 11/15/2012   Procedure: ICD GENERATOR CHANGE;  Surgeon: Deboraha Sprang, MD;  Location: [facility] CATH LAB;  Service: Cardiovascular;  Laterality: N/A;   JOINT REPLACEMENT  2009   Total  Knee Replacement, Left   LEAD REVISION N/A 11/15/2012   Procedure: LEAD REVISION;  Surgeon: Deboraha Sprang, MD;  Location: [facility] CATH LAB;  Service: Cardiovascular;  Laterality: N/A;   PACEMAKER INSERTION  06/30/2005   Medtronic Virtuoso VR ICD - Nonischemic cardiomyopathy potentially related to adriamycin, class 3 congestive failture with a narrow QRS and a negative TDI   TONSILLECTOMY     TUBAL LIGATION      Current Outpatient Medications  Medication Sig Dispense Refill   alendronate (FOSAMAX) 70 MG tablet Take 70 mg by mouth once a week.     Ascorbic Acid (VITAMIN C) 1000 MG tablet Take 1,000 mg by mouth daily.     Calcium Carbonate-Vitamin D 600-400 MG-UNIT tablet Take 1 tablet by mouth 2 (two) times daily.       carvedilol (COREG) 3.125 MG tablet Take 1 tablet (3.125 mg total) by mouth 2 (two) times daily. 60 tablet 5   Cinnamon 500 MG capsule Take 1,000 mg by mouth daily.     docusate sodium (COLACE) 100 MG capsule Take 300 mg by mouth at bedtime.      DULoxetine (CYMBALTA) 30 MG capsule Take 30 mg by mouth daily.     ELDERBERRY PO Take 1 Dose by mouth daily.     ELIQUIS 5 MG TABS tablet TAKE 1 TABLET BY MOUTH TWICE DAILY 180 tablet 1   gabapentin (NEURONTIN) 600 MG tablet Take 600 mg by mouth 2 (two) times daily.     glimepiride (AMARYL) 4 MG tablet Take 4 mg by mouth 2 (two) times daily before lunch and supper.      levothyroxine (SYNTHROID, LEVOTHROID) 100 MCG tablet Take 100 mcg by mouth daily.      Multiple Vitamin (MULTIVITAMIN WITH MINERALS) TABS tablet Take 1 tablet by mouth daily. Centrum Silver     Multiple Vitamins-Minerals (EYE-VITE EXTRA PLUS LUTEIN PO) Take 1 tablet by mouth daily.     Multiple Vitamins-Minerals (ZINC PO) Take 1 Dose by mouth daily.     omeprazole (PRILOSEC) 20 MG capsule Take 20 mg by mouth daily.       sacubitril-valsartan (ENTRESTO) 24-26 MG Take 1 tablet by mouth 2 (two) times daily. 60 tablet 2   Semaglutide,0.25 or 0.5MG/DOS, (OZEMPIC, 0.25 OR 0.5  MG/DOSE,) 2 MG/1.5ML SOPN Inject 0.5 mg into the skin every Friday.      spironolactone (ALDACTONE) 25 MG tablet Take 0.5 tablets (12.5 mg total) by mouth daily. 45 tablet 1   torsemide 40 MG TABS Take 40 mg by mouth daily. 180 tablet 3   No current facility-administered medications for this visit.   Allergies  Allergen Reactions   Ibuprofen Other (See Comments)    Other reaction(s): Liver Disorder    Review of Systems negative except from HPI and PMH  Physical Exam: BP 108/62 (BP Location: Left Arm, Patient Position: Sitting, Cuff Size: Normal)   Pulse 86  Ht 4' 11"  (1.499 m)   Wt 200 lb (90.7 kg)   SpO2 98%   BMI 40.40 kg/m  Well developed and well nourished in no acute distress HENT normal Neck JVT -7  Lungs Clear Device pocket well healed; without hematoma or erythema.  There is no tethering  Irregular irregularly  rate and rhythm, no murmur Abd-soft with active BS No Clubbing cyanosis  No edema Skin-warm and dry A & Oriented  Grossly normal sensory and motor function  ECG: Atrial fibrillation at 86 Intervals-/12/41 Axis left -41  Assessment and  Plan  Nonischemic cardiomyopathy  Heart failure chronic systolic  Hypertension      PVCs/VT nonsustained-increased burden  Implantable defibrillator-Medtronic        Sleep apnea  -- compliant  Atrial fibrillation-persistent holding sinus rhythm   Recurrent atrial fibrillation-persisting.  Compliant with her Eliquis.  5 twice daily.  We will undertake cardioversion  Her cardiomyopathy she is on improved guideline directed therapy; blood pressure limiting further up titration but will continue Entresto 24/26, Aldactone 12.5 and carvedilol 3.125.  Would like to get her an SGLT2.  She is to see her diabetologist within the week.  We will ask her about switching her from the semaglutide to an SGLT2.  VT burden seems less on the new medications.  We will continue them.     I,Stephanie Williams,acting as a Education administrator  for [name], MD.,have documented all relevant documentation on the behalf of [name], MD,as directed by  [name], MD while in the presence of [name], MD.  I, [name], MD, have reviewed all documentation for this visit. The documentation on 09/23/20 for the exam, diagnosis, procedures, and orders are all accurate and complete.

## 2020-09-23 NOTE — Patient Instructions (Addendum)
Medication Instructions:  - Your physician recommends that you continue on your current medications as directed. Please refer to the Current Medication list given to you today.  - talk with your diabetic doctor about switching you from [place] to either Jardiance/ Farxiga   *If you need a refill on your cardiac medications before your next appointment, please call your pharmacy*   Lab Work: - none ordered  If you have labs (blood work) drawn today and your tests are completely normal, you will receive your results only by: [place] (if you have MyChart) OR A paper copy in the mail If you have any lab test that is abnormal or we need to change your treatment, we will call you to review the results.   Testing/Procedures: - Your physician has recommended that you have a Cardioversion (DCCV). Electrical Cardioversion uses a jolt of electricity to your heart either through paddles or wired patches attached to your chest. This is a controlled, usually prescheduled, procedure. Defibrillation is done under light anesthesia in the hospital, and you usually go home the day of the procedure. This is done to get your heart back into a normal rhythm.    You are scheduled for a Cardioversion on Monday 09/29/20 with Dr. Fletcher Anon.  Please arrive at the [place] [facility] at 6:30 a.m. on the day of your procedure.  DIET INSTRUCTIONS:  Nothing to eat or drink after midnight the night prior to your procedure         Labs: N/A  Medications:  You may take all of your regular medications with enough water to get them down safely unless listed below:  - HOLD torsemide the morning of your procedure  Must have a responsible person to drive you home.  Bring a current list of your medications and current insurance cards.    If you have any questions after you get home, please call the office at [phone]    Follow-Up: At [facility], you and your health needs are our priority.  As part of  our continuing mission to provide you with exceptional heart care, we have created designated Provider Care Teams.  These Care Teams include your primary Cardiologist (physician) and Advanced Practice Providers (APPs -  Physician Assistants and Nurse Practitioners) who all work together to provide you with the care you need, when you need it.  We recommend signing up for the patient portal called "MyChart".  Sign up information is provided on this After Visit Summary.  MyChart is used to connect with patients for Virtual Visits (Telemedicine).  Patients are able to view lab/test results, encounter notes, upcoming appointments, etc.  Non-urgent messages can be sent to your provider as well.   To learn more about what you can do with MyChart, go to [email or web address].    Your next appointment:   1) 3-4 weeks with the PA/ NP  2) 6 months with Dr. [name]  The format for your next appointment:   In Person  Provider:   As above   Other Instructions N/a

## 2020-09-23 NOTE — H&P (View-Only) (Signed)
Patient ID: Katherine Rosales, female   DOB: [birth year], 70 y.o.   MRN: [id number] Patient Care Team: Dion Body, MD as PCP - General (Family Medicine) Deboraha Sprang, MD as PCP - Cardiology (Cardiology)   HPI  Katherine Rosales is a 70 y.o. female Seen in followup for Medtronic  ICD implanted for nonischemic cardiomyopathy and primary prevention. She has chronic systolic failure. Generator replacement in 8/14.  10/21 acute heart failure associated with atrial fibrillation with a rapid rate.  Temporally related to discontinuing her CPAP  Now anticoagulated with Eliquis without  bleeding  Seen 4/22 with increasing burden of nonsustained ventricular tachycardia.  Resumed her Aldactone 12.5 and transition her back from metoprolol--carvedilol.  In the interval started her on Entresto and she is considerably better.     Today, the patient denies chest pain, nocturnal dyspnea, orthopnea or peripheral edema.  There have been no palpitations, lightheadedness or syncope.  Complains of mild exertional shortness of breath but improved since initiation of Entresto; her energy and fatigue is also better.  Noted by ECG to be in atrial fibrillation.  No awareness  She inquired about a medication assistance    She notes she doesn't use her Cpap at night   Date Cr K Hgb  8/19 1.6 4.5 11.7  3/20 1/2  4.4 11.9  10/20 1.2 4.1 11.6  5/21 1.3 4.6 (2/21) 11.8   12/21 1.31 3.8 (10/21)12.5  4/22 1.28 3.8       DATE TEST EF%   6/14 Echo   20-25 %   9/20 Echo   30-35 %   5/22 Echo 20-25%       Past Medical History:  Diagnosis Date   6949 Lead-Medtronic    Anemia 11/12/2016   Anxiety 12/25/2013   Automatic implantable cardioverter-defibrillator in situ    Breast cancer ([place]) 03/1995   right breast ca with lumpectomy and chemo and rad   Cardiomyopathy, nonischemic ([place])    Chronic kidney disease 11/12/2016   stage 3   Depression    Diabetes mellitus    Type II Diabetes   Hypertension     Hypothyroidism    ICD (implantable cardiac defibrillator), singleMedtronic    NASH (nonalcoholic steatohepatitis)    Personal history of chemotherapy 1996   right breast ca   Personal history of radiation therapy 1996   right breast ca   Sleep apnea    On CPAP   Spleen enlarged    Systolic heart failure, chronic ([place])     Past Surgical History:  Procedure Laterality Date   ABDOMINAL HYSTERECTOMY     BREAST BIOPSY Right 11/26/2015   - FAT NECROSIS, FIBROSIS, AND CHRONIC INFLAMMATION.    BREAST BIOPSY Right 12/20/2018   Affirm Bx- X-clip- path pending   BREAST BIOPSY WITH RADIO FREQUENCY LOCALIZER Right 10/18/2019   Procedure: RF TAG GUIDED EXCISIONAL BREAST BIOPSY;  Surgeon: Benjamine Sprague, DO;  Location: [facility] ORS;  Service: General;  Laterality: Right;   BREAST EXCISIONAL BIOPSY Right 03/1995   + breast ca chemo and rad. 9:00 region   BREAST EXCISIONAL BIOPSY Right 2011   surgical bx neg  11:00 region   BREAST LUMPECTOMY Right 1996   CARDIOVERSION N/A 02/13/2020   Procedure: CARDIOVERSION;  Surgeon: Kate Sable, MD;  Location: [facility] ORS;  Service: Cardiovascular;  Laterality: N/A;   CHOLECYSTECTOMY     COLONOSCOPY  03/05/2013   COLONOSCOPY W/ POLYPECTOMY  03/05/2013   COLONOSCOPY WITH PROPOFOL N/A 10/03/2017   Procedure: COLONOSCOPY WITH PROPOFOL;  Surgeon: Manya Silvas, MD;  Location[facility] ENDOSCOPY;  Service: Endoscopy;  Laterality: N/A;   ESOPHAGOGASTRODUODENOSCOPY  01/11/2003, 06/25/2015   ESOPHAGOGASTRODUODENOSCOPY (EGD) WITH PROPOFOL N/A 06/25/2015   Procedure: ESOPHAGOGASTRODUODENOSCOPY (EGD) WITH PROPOFOL;  Surgeon: Manya Silvas, MD;  Location: [facility] ENDOSCOPY;  Service: Endoscopy;  Laterality: N/A;   FOOT SURGERY Right    IMPLANTABLE CARDIOVERTER DEFIBRILLATOR (ICD) GENERATOR CHANGE N/A 11/15/2012   Procedure: ICD GENERATOR CHANGE;  Surgeon: Deboraha Sprang, MD;  Location: [facility] CATH LAB;  Service: Cardiovascular;  Laterality: N/A;   JOINT REPLACEMENT  2009   Total  Knee Replacement, Left   LEAD REVISION N/A 11/15/2012   Procedure: LEAD REVISION;  Surgeon: Deboraha Sprang, MD;  Location: [facility] C[facility] LAB;  Service: Cardiovascular;  Laterality: N/A;   PACEMAKER INSERTION  06/30/2005   Medtronic Virtuoso VR ICD - Nonischemic cardiomyopathy potentially related to adriamycin, class 3 congestive failture with a narrow QRS and a negative TDI   TONSILLECTOMY     TUBAL LIGATION      Current Outpatient Medications  Medication Sig Dispense Refill   alendronate (FOSAMAX) 70 MG tablet Take 70 mg by mouth once a week.     Ascorbic Acid (VITAMIN C) 1000 MG tablet Take 1,000 mg by mouth daily.     Calcium Carbonate-Vitamin D 600-400 MG-UNIT tablet Take 1 tablet by mouth 2 (two) times daily.       carvedilol (COREG) 3.125 MG tablet Take 1 tablet (3.125 mg total) by mouth 2 (two) times daily. 60 tablet 5   Cinnamon 500 MG capsule Take 1,000 mg by mouth daily.     docusate sodium (COLACE) 100 MG capsule Take 300 mg by mouth at bedtime.      DULoxetine (CYMBALTA) 30 MG capsule Take 30 mg by mouth daily.     ELDERBERRY PO Take 1 Dose by mouth daily.     ELIQUIS 5 MG TABS tablet TAKE 1 TABLET BY MOUTH TWICE DAILY 180 tablet 1   gabapentin (NEURONTIN) 600 MG tablet Take 600 mg by mouth 2 (two) times daily.     glimepiride (AMARYL) 4 MG tablet Take 4 mg by mouth 2 (two) times daily before lunch and supper.      levothyroxine (SYNTHROID, LEVOTHROID) 100 MCG tablet Take 100 mcg by mouth daily.      Multiple Vitamin (MULTIVITAMIN WITH MINERALS) TABS tablet Take 1 tablet by mouth daily. Centrum Silver     Multiple Vitamins-Minerals (EYE-VITE EXTRA PLUS LUTEIN PO) Take 1 tablet by mouth daily.     Multiple Vitamins-Minerals (ZINC PO) Take 1 Dose by mouth daily.     omeprazole (PRILOSEC) 20 MG capsule Take 20 mg by mouth daily.       sacubitril-valsartan (ENTRESTO) 24-26 MG Take 1 tablet by mouth 2 (two) times daily. 60 tablet 2   Semaglutide,0.25 or 0.5MG/DOS, (OZEMPIC, 0.25 OR 0.5  MG/DOSE,) 2 MG/1.5ML SOPN Inject 0.5 mg into the skin every Friday.      spironolactone (ALDACTONE) 25 MG tablet Take 0.5 tablets (12.5 mg total) by mouth daily. 45 tablet 1   torsemide 40 MG TABS Take 40 mg by mouth daily. 180 tablet 3   No current facility-administered medications for this visit.   Allergies  Allergen Reactions   Ibuprofen Other (See Comments)    Other reaction(s): Liver Disorder    Review of Systems negative except from HPI and PMH  Physical Exam: BP 108/62 (BP Location: Left Arm, Patient Position: Sitting, Cuff Size: Normal)   Pulse 86  Ht 4' 11"  (1.499 m)   Wt 200 lb (90.7 kg)   SpO2 98%   BMI 40.40 kg/m  Well developed and well nourished in no acute distress HENT normal Neck JVT -7  Lungs Clear Device pocket well healed; without hematoma or erythema.  There is no tethering  Irregular irregularly  rate and rhythm, no murmur Abd-soft with active BS No Clubbing cyanosis  No edema Skin-warm and dry A & Oriented  Grossly normal sensory and motor function  ECG: Atrial fibrillation at 86 Intervals-/12/41 Axis left -41  Assessment and  Plan  Nonischemic cardiomyopathy  Heart failure chronic systolic  Hypertension      PVCs/VT nonsustained-increased burden  Implantable defibrillator-Medtronic        Sleep apnea  -- compliant  Atrial fibrillation-persistent holding sinus rhythm   Recurrent atrial fibrillation-persisting.  Compliant with her Eliquis.  5 twice daily.  We will undertake cardioversion  Her cardiomyopathy she is on improved guideline directed therapy; blood pressure limiting further up titration but will continue Entresto 24/26, Aldactone 12.5 and carvedilol 3.125.  Would like to get her an SGLT2.  She is to see her diabetologist within the week.  We will ask her about switching her from the semaglutide to an SGLT2.  VT burden seems less on the new medications.  We will continue them.     I,Stephanie Williams,acting as a Education administrator  for [name], MD.,have documented all relevant documentation on the behalf of [name], MD,as directed by  [name], MD while in the presence of [name], MD.  I, [name], MD, have reviewed all documentation for this visit. The documentation on 09/23/20 for the exam, diagnosis, procedures, and orders are all accurate and complete.

## 2020-09-25 ENCOUNTER — Other Ambulatory Visit: Payer: Self-pay | Admitting: Internal Medicine

## 2020-09-29 ENCOUNTER — Encounter: Payer: Self-pay | Admitting: Cardiovascular Disease

## 2020-09-29 ENCOUNTER — Ambulatory Visit: Payer: Medicare HMO | Admitting: Anesthesiology

## 2020-09-29 ENCOUNTER — Other Ambulatory Visit: Payer: Self-pay

## 2020-09-29 ENCOUNTER — Ambulatory Visit
Admission: RE | Admit: 2020-09-29 | Discharge: 2020-09-29 | Disposition: A | Discharge status: Home / Self Care | Payer: Medicare HMO | Attending: Cardiovascular Disease | Admitting: Cardiovascular Disease

## 2020-09-29 ENCOUNTER — Encounter
Admission: RE | Disposition: A | Discharge status: Home / Self Care | Payer: Self-pay | Source: Home / Self Care | Attending: Cardiovascular Disease

## 2020-09-29 DIAGNOSIS — Z95810 Presence of automatic (implantable) cardiac defibrillator: Secondary | ICD-10-CM | POA: Insufficient documentation

## 2020-09-29 DIAGNOSIS — I130 Hypertensive heart and chronic kidney disease with heart failure and stage 1 through stage 4 chronic kidney disease, or unspecified chronic kidney disease: Secondary | ICD-10-CM | POA: Insufficient documentation

## 2020-09-29 DIAGNOSIS — I5022 Chronic systolic (congestive) heart failure: Secondary | ICD-10-CM | POA: Diagnosis not present

## 2020-09-29 DIAGNOSIS — Z7901 Long term (current) use of anticoagulants: Secondary | ICD-10-CM | POA: Insufficient documentation

## 2020-09-29 DIAGNOSIS — Z7984 Long term (current) use of oral hypoglycemic drugs: Secondary | ICD-10-CM | POA: Insufficient documentation

## 2020-09-29 DIAGNOSIS — I428 Other cardiomyopathies: Secondary | ICD-10-CM | POA: Diagnosis not present

## 2020-09-29 DIAGNOSIS — I129 Hypertensive chronic kidney disease with stage 1 through stage 4 chronic kidney disease, or unspecified chronic kidney disease: Secondary | ICD-10-CM | POA: Diagnosis not present

## 2020-09-29 DIAGNOSIS — E1122 Type 2 diabetes mellitus with diabetic chronic kidney disease: Secondary | ICD-10-CM | POA: Diagnosis not present

## 2020-09-29 DIAGNOSIS — Z886 Allergy status to analgesic agent status: Secondary | ICD-10-CM | POA: Diagnosis not present

## 2020-09-29 DIAGNOSIS — K7581 Nonalcoholic steatohepatitis (NASH): Secondary | ICD-10-CM | POA: Diagnosis not present

## 2020-09-29 DIAGNOSIS — N041 Nephrotic syndrome with focal and segmental glomerular lesions: Secondary | ICD-10-CM | POA: Diagnosis not present

## 2020-09-29 DIAGNOSIS — R809 Proteinuria, unspecified: Secondary | ICD-10-CM | POA: Diagnosis not present

## 2020-09-29 DIAGNOSIS — N183 Chronic kidney disease, stage 3 unspecified: Secondary | ICD-10-CM | POA: Insufficient documentation

## 2020-09-29 DIAGNOSIS — I4891 Unspecified atrial fibrillation: Secondary | ICD-10-CM | POA: Diagnosis not present

## 2020-09-29 DIAGNOSIS — Z79899 Other long term (current) drug therapy: Secondary | ICD-10-CM | POA: Diagnosis not present

## 2020-09-29 DIAGNOSIS — Z79890 Hormone replacement therapy (postmenopausal): Secondary | ICD-10-CM | POA: Diagnosis not present

## 2020-09-29 DIAGNOSIS — E039 Hypothyroidism, unspecified: Secondary | ICD-10-CM | POA: Insufficient documentation

## 2020-09-29 DIAGNOSIS — N2581 Secondary hyperparathyroidism of renal origin: Secondary | ICD-10-CM | POA: Diagnosis not present

## 2020-09-29 DIAGNOSIS — I4819 Other persistent atrial fibrillation: Secondary | ICD-10-CM | POA: Insufficient documentation

## 2020-09-29 DIAGNOSIS — N1832 Chronic kidney disease, stage 3b: Secondary | ICD-10-CM | POA: Diagnosis not present

## 2020-09-29 HISTORY — PX: CARDIOVERSION: SHX1299

## 2020-09-29 LAB — GLUCOSE, CAPILLARY: Glucose-Capillary: 81 mg/dL (ref 70–99)

## 2020-09-29 SURGERY — CARDIOVERSION
Anesthesia: General

## 2020-09-29 MED ORDER — PROPOFOL 10 MG/ML IV BOLUS
INTRAVENOUS | Status: DC | PRN
  Administered 2020-09-29: 40 mg via INTRAVENOUS

## 2020-09-29 MED ORDER — ELIQUIS 5 MG PO TABS: 5.0000 mg | ORAL_TABLET | Freq: Two times a day (BID) | ORAL | Status: DC

## 2020-09-29 MED ORDER — PROPOFOL 10 MG/ML IV BOLUS
INTRAVENOUS | Status: AC
  Filled 2020-09-29: qty 20

## 2020-09-29 MED ORDER — SODIUM CHLORIDE 0.9 % IV SOLN: INTRAVENOUS | Status: DC

## 2020-09-29 NOTE — Interval H&P Note (Signed)
History and Physical Interval Note:  09/29/2020 7:43 AM  Katherine Rosales  has presented today for surgery, with the diagnosis of Cardioversion  Afib.  The various methods of treatment have been discussed with the patient and family. After consideration of risks, benefits and other options for treatment, the patient has consented to  Procedure(s): CARDIOVERSION (N/A) as a surgical intervention.  The patient's history has been reviewed, patient examined, no change in status, stable for surgery.  I have reviewed the patient's chart and labs.  Questions were answered to the patient's satisfaction.     [name]

## 2020-09-29 NOTE — Transfer of Care (Signed)
Immediate Anesthesia Transfer of Care Note  Patient: Katherine Rosales  Procedure(s) Performed: CARDIOVERSION  Patient Location: PACU and Cath Lab  Anesthesia Type:MAC  Level of Consciousness: awake  Airway & Oxygen Therapy: Patient Spontanous Breathing and Patient connected to nasal cannula oxygen  Post-op Assessment: Report given to RN and Post -op Vital signs reviewed and stable  Post vital signs: Reviewed and stable  Last Vitals:  Vitals Value Taken Time  BP 83/51 09/29/20 0751  Temp    Pulse 112 09/29/20 0754  Resp 14 09/29/20 0754  SpO2 94 % 09/29/20 0754    Last Pain:  Vitals:   09/29/20 0701  TempSrc: Oral  PainSc: 0-No pain      Patients Stated Pain Goal: 0 (52/48/18 5909)  Complications: No notable events documented.

## 2020-09-29 NOTE — Anesthesia Preprocedure Evaluation (Signed)
Anesthesia Evaluation  Patient identified by MRN, date of birth, ID band Patient awake    Reviewed: Allergy & Precautions, NPO status , Patient's Chart, lab work & pertinent test results  History of Anesthesia Complications Negative for: history of anesthetic complications  Airway Mallampati: II  TM Distance: >3 FB Neck ROM: Full    Dental no notable dental hx.    Pulmonary sleep apnea , neg COPD,    breath sounds clear to auscultation- rhonchi (-) wheezing      Cardiovascular hypertension, Pt. on medications (-) CAD, (-) Past MI, (-) Cardiac Stents and (-) CABG  Rhythm:Regular Rate:Normal - Systolic murmurs and - Diastolic murmurs    Neuro/Psych neg Seizures PSYCHIATRIC DISORDERS Anxiety Depression negative neurological ROS     GI/Hepatic negative GI ROS,   Endo/Other  diabetes, Oral Hypoglycemic AgentsHypothyroidism   Renal/GU Renal InsufficiencyRenal disease     Musculoskeletal negative musculoskeletal ROS (+)   Abdominal (+) + obese,   Peds  Hematology  (+) anemia ,   Anesthesia Other Findings Past Medical History: No date: 6949 Lead-Medtronic 11/12/2016: Anemia 12/25/2013: Anxiety No date: Automatic implantable cardioverter-defibrillator in situ 03/1995: Breast cancer [facility])     Comment:  right breast ca with lumpectomy and chemo and rad No date: Cardiomyopathy, nonischemic ([place]) 11/12/2016: Chronic kidney disease     Comment:  stage 3 No date: Depression No date: Diabetes mellitus     Comment:  Type II Diabetes No date: Hypertension No date: Hypothyroidism No date: ICD (implantable cardiac defibrillator), singleMedtronic No date: NASH (nonalcoholic steatohepatitis) 1996: Personal history of chemotherapy     Comment:  right breast ca 1996: Personal history of radiation therapy     Comment:  right breast ca No date: Sleep apnea     Comment:  On CPAP No date: Spleen enlarged No date: Systolic heart  failure, chronic ([facility])   Reproductive/Obstetrics                             Anesthesia Physical Anesthesia Plan  ASA: 3  Anesthesia Plan: General   Post-op Pain Management:    Induction: Intravenous  PONV Risk Score and Plan: 2 and Propofol infusion  Airway Management Planned: Natural Airway  Additional Equipment:   Intra-op Plan:   Post-operative Plan:   Informed Consent: I have reviewed the patients History and Physical, chart, labs and discussed the procedure including the risks, benefits and alternatives for the proposed anesthesia with the patient or authorized representative who has indicated his/her understanding and acceptance.     Dental advisory given  Plan Discussed with: CRNA and Anesthesiologist  Anesthesia Plan Comments:         Anesthesia Quick Evaluation

## 2020-09-29 NOTE — Anesthesia Postprocedure Evaluation (Signed)
Anesthesia Post Note  Patient: Katherine Rosales  Procedure(s) Performed: CARDIOVERSION  Patient location during evaluation: Other (specials recovery) Anesthesia Type: General Level of consciousness: awake and alert and oriented Pain management: pain level controlled Vital Signs Assessment: post-procedure vital signs reviewed and stable Respiratory status: spontaneous breathing, nonlabored ventilation and respiratory function stable Cardiovascular status: blood pressure returned to baseline and stable Postop Assessment: no signs of nausea or vomiting Anesthetic complications: no   No notable events documented.   Last Vitals:  Vitals:   09/29/20 0824 09/29/20 0830  BP: (!) 85/58 (!) 84/59  Pulse: 72 68  Resp: 15 19  Temp:    SpO2: 99% 98%    Last Pain:  Vitals:   09/29/20 0701  TempSrc: Oral  PainSc: 0-No pain                 Jaiyah Beining

## 2020-09-29 NOTE — CV Procedure (Signed)
Cardioversion note: A standard informed consent was obtained. Timeout was performed. The pads were placed in the anterior posterior fashion. The patient was given propofol by the anesthesia team.  Successful cardioversion was performed with a 200 J. The patient converted to sinus rhythm. Pre-and post EKGs were reviewed. The patient tolerated the procedure with no immediate complications.  Recommendations: Continue same medications and follow-up as planned

## 2020-09-30 ENCOUNTER — Other Ambulatory Visit: Payer: Self-pay | Admitting: Internal Medicine

## 2020-09-30 NOTE — Telephone Encounter (Signed)
Eliquis 48m refill request received. Patient is 70years old, weight-91.6kg, Crea-1.28 on 07/07/2020, Diagnosis-Afib, and last seen by Dr. KCaryl Comeson 09/23/2020. Dose is appropriate based on dosing criteria. Will send in refill to requested pharmacy.    Refill appears to have been sent but on No Print so resending.

## 2020-09-30 NOTE — Telephone Encounter (Signed)
This is a [place] pt 

## 2020-09-30 NOTE — Telephone Encounter (Signed)
Refill Request.  

## 2020-10-02 ENCOUNTER — Ambulatory Visit: Payer: Medicare HMO | Admitting: Cardiology

## 2020-10-02 ENCOUNTER — Telehealth: Payer: Self-pay | Admitting: Internal Medicine

## 2020-10-02 DIAGNOSIS — E1122 Type 2 diabetes mellitus with diabetic chronic kidney disease: Secondary | ICD-10-CM | POA: Diagnosis not present

## 2020-10-02 DIAGNOSIS — I10 Essential (primary) hypertension: Secondary | ICD-10-CM | POA: Diagnosis not present

## 2020-10-02 DIAGNOSIS — E039 Hypothyroidism, unspecified: Secondary | ICD-10-CM | POA: Diagnosis not present

## 2020-10-02 DIAGNOSIS — N1832 Chronic kidney disease, stage 3b: Secondary | ICD-10-CM | POA: Diagnosis not present

## 2020-10-02 DIAGNOSIS — E1143 Type 2 diabetes mellitus with diabetic autonomic (poly)neuropathy: Secondary | ICD-10-CM | POA: Diagnosis not present

## 2020-10-02 NOTE — Telephone Encounter (Signed)
[employer] and was informed that the patient has been approved for her Entresto 24-26 mg through the end of this calendar year. The medication will be mailed to her home in the next couple days.  Called patient and informed her of this, she was very appreciative for the call back.

## 2020-10-02 NOTE — Telephone Encounter (Signed)
Patient came by office  Wants to make sure that the medication will be going to her house She received a letter stating it may come to the office

## 2020-10-07 NOTE — Telephone Encounter (Signed)
Patient has been approved through the end of the year, application has been filed in the med room.

## 2020-10-17 ENCOUNTER — Encounter: Payer: Self-pay | Admitting: Nurse Practitioner

## 2020-10-17 ENCOUNTER — Ambulatory Visit: Payer: Medicare HMO | Admitting: Nurse Practitioner

## 2020-10-17 ENCOUNTER — Other Ambulatory Visit: Payer: Self-pay

## 2020-10-17 VITALS — BP 98/64 | HR 76 | Ht 59.0 in | Wt 204.0 lb

## 2020-10-17 DIAGNOSIS — I5022 Chronic systolic (congestive) heart failure: Secondary | ICD-10-CM

## 2020-10-17 DIAGNOSIS — I10 Essential (primary) hypertension: Secondary | ICD-10-CM

## 2020-10-17 DIAGNOSIS — I480 Paroxysmal atrial fibrillation: Secondary | ICD-10-CM

## 2020-10-17 DIAGNOSIS — I493 Ventricular premature depolarization: Secondary | ICD-10-CM | POA: Diagnosis not present

## 2020-10-17 DIAGNOSIS — G4733 Obstructive sleep apnea (adult) (pediatric): Secondary | ICD-10-CM | POA: Diagnosis not present

## 2020-10-17 DIAGNOSIS — I428 Other cardiomyopathies: Secondary | ICD-10-CM

## 2020-10-17 NOTE — Progress Notes (Signed)
Office Visit    Patient Name: Katherine Rosales Date of Encounter: 10/17/2020  Primary Care Provider:  Dion Body, MD Primary Cardiologist:  [name], MD  Chief Complaint    70 year old female with a history of nonischemic cardiomyopathy, HFrEF, status post AICD placement, hypertension PVCs, sleep apnea, diabetes, hypothyroidism, anxiety, and GERD, who presents for follow-up after recent cardioversion in the setting of paroxysmal atrial fibrillation.  Past Medical History    Past Medical History:  Diagnosis Date   6949 Lead-Medtronic    Anemia 11/12/2016   Anxiety 12/25/2013   Automatic implantable cardioverter-defibrillator in situ    Breast cancer ([place]) 03/1995   right breast ca with lumpectomy and chemo and rad   Cardiomyopathy, nonischemic ([place])    a. Prior cath w/ nonobs CAD; b. s/p AICD w/ subsequent upgrade 11/2012-> MDT [place] XT VR single lead ICD (ser # [id number]); c. 12/2018 Echo: EF 30-35%; d. 08/2020 Echo: EF 20-25%, glob HK. Mod elev RVSP. Mod dil LA, mild to mod RAE. Mod MR/TR. Mild-mod AoV sclerosis.   Chronic HFrEF (heart failure with reduced ejection fraction) ([place])    a. 12/2018 Echo: EF 30-35%; b. 08/2020 Echo: EF 20-25%, glob HK.   CKD (chronic kidney disease), stage III ([place]) 11/12/2016   Depression    Diabetes mellitus    Type II Diabetes   Hypertension    Hypothyroidism    ICD (implantable cardiac defibrillator), singleMedtronic    NASH (nonalcoholic steatohepatitis)    OSA (obstructive sleep apnea)    a. On CPAP.   PAF (paroxysmal atrial fibrillation) ([place])    a. 09/2020 s/p DCCV.   Personal history of chemotherapy 1996   right breast ca   Personal history of radiation therapy 1996   right breast ca   PVC's (premature ventricular contractions)    a. 08/2020 Zio: 8.1% PVC burden.   Sleep apnea    On CPAP   Spleen enlarged    Systolic heart failure, chronic ([place])    Past Surgical History:  Procedure Laterality Date   ABDOMINAL  HYSTERECTOMY     BREAST BIOPSY Right 11/26/2015   - FAT NECROSIS, FIBROSIS, AND CHRONIC INFLAMMATION.    BREAST BIOPSY Right 12/20/2018   Affirm Bx- X-clip- path pending   BREAST BIOPSY WITH RADIO FREQUENCY LOCALIZER Right 10/18/2019   Procedure: RF TAG GUIDED EXCISIONAL BREAST BIOPSY;  Surgeon: Benjamine Sprague, DO;  Location: [facility] ORS;  Service: General;  Laterality: Right;   BREAST EXCISIONAL BIOPSY Right 03/1995   + breast ca chemo and rad. 9:00 region   BREAST EXCISIONAL BIOPSY Right 2011   surgical bx neg  11:00 region   BREAST LUMPECTOMY Right 1996   CARDIOVERSION N/A 02/13/2020   Procedure: CARDIOVERSION;  Surgeon: Kate Sable, MD;  Location: [facility] ORS;  Service: Cardiovascular;  Laterality: N/A;   CARDIOVERSION N/A 09/29/2020   Procedure: CARDIOVERSION;  Surgeon: Wellington Hampshire, MD;  Location: [facility] ORS;  Service: Cardiovascular;  Laterality: N/A;   CHOLECYSTECTOMY     COLONOSCOPY  03/05/2013   COLONOSCOPY W/ POLYPECTOMY  03/05/2013   COLONOSCOPY WITH PROPOFOL N/A 10/03/2017   Procedure: COLONOSCOPY WITH PROPOFOL;  Surgeon: Manya Silvas, MD;  Location: [facility] ENDOSCOPY;  Service: Endoscopy;  Laterality: N/A;   ESOPHAGOGASTRODUODENOSCOPY  01/11/2003, 06/25/2015   ESOPHAGOGASTRODUODENOSCOPY (EGD) WITH PROPOFOL N/A 06/25/2015   Procedure: ESOPHAGOGASTRODUODENOSCOPY (EGD) WITH PROPOFOL;  Surgeon: Manya Silvas, MD;  Location: [facility] ENDOSCOPY;  Service: Endoscopy;  Laterality: N/A;   FOOT SURGERY Right    IMPLANTABLE CARDIOVERTER  DEFIBRILLATOR (ICD) GENERATOR CHANGE N/A 11/15/2012   Procedure: ICD GENERATOR CHANGE;  Surgeon: Deboraha Sprang, MD;  Location: [facility] C[facility] LAB;  Service: Cardiovascular;  Laterality: N/A;   JOINT REPLACEMENT  2009   Total Knee Replacement, Left   LEAD REVISION N/A 11/15/2012   Procedure: LEAD REVISION;  Surgeon: Deboraha Sprang, MD;  Location: [facility] CATH LAB;  Service: Cardiovascular;  Laterality: N/A;   PACEMAKER INSERTION  06/30/2005   Medtronic Virtuoso VR  ICD - Nonischemic cardiomyopathy potentially related to adriamycin, class 3 congestive failture with a narrow QRS and a negative TDI   TONSILLECTOMY     TUBAL LIGATION      Allergies  Allergies  Allergen Reactions   Ibuprofen Other (See Comments)    Avoid due to stage 3 kidney disease      History of Present Illness    70 year old female with above complex past medical history including nonischemic cardiomyopathy, HFrEF, AICD placement, hypertension, PVCs, sleep apnea, diabetes, hypothyroidism, anxiety, GERD, and paroxysmal atrial fibrillation.  She was diagnosed with a nonischemic cardiomyopathy in the setting of breast cancer and Adriamycin therapy.  Remote catheterization reportedly showed no significant coronary artery disease.  She developed overt heart failure in 2006 and subsequently underwent single-lead ICD placement in March 2007.  This was changed out in 2014.  EF in September 2020 was 30 to 35% however, in May of this year, she underwent repeat echocardiography in the setting of frequent ventricular ectopy, revealing an EF of 20 to 25% with global hypokinesis, and moderate mitral regurgitation.  Zio monitoring showed an 8.1% PVC burden, which was not felt to be contributing to cardiomyopathy.  Medications were adjusted to make room for the addition of Entresto, spironolactone, and most recently empagliflozin, which was recently by her endocrinologist.  When Ms. Witz was seen in June of this year, she was noted to be in atrial fibrillation.  She underwent successful cardioversion on June 27.  Since her cardioversion, Ms. Thole is felt reasonably well.  She notes a stable weight at home without edema.  She has been tolerating her medications well.  She does note chronic, stable dyspnea on exertion but denies chest pain, PND, orthopnea, dizziness, syncope, or early satiety.  She occasionally notes a skipped beat or palpitation, especially while lying in bed at night.  She is maintaining  sinus rhythm today.  She admits to eating lots of cucumber and tomato sandwiches with added table salt and we discussed the importance of sodium restriction in the setting of heart failure.  Home Medications    Current Outpatient Medications  Medication Sig Dispense Refill   alendronate (FOSAMAX) 70 MG tablet Take 70 mg by mouth every Thursday.     apixaban (ELIQUIS) 5 MG TABS tablet TAKE 1 TABLET BY MOUTH TWICE DAILY 60 tablet 5   Ascorbic Acid (VITAMIN C) 1000 MG tablet Take 1,000 mg by mouth daily.     Calcium Carb-Cholecalciferol (CALCIUM 600 + D PO) Take 1 tablet by mouth 2 (two) times daily.     carvedilol (COREG) 3.125 MG tablet Take 1 tablet (3.125 mg total) by mouth 2 (two) times daily. 60 tablet 5   CINNAMON PO Take 1,000 mg by mouth daily.     docusate sodium (COLACE) 100 MG capsule Take 300 mg by mouth at bedtime.      DULoxetine (CYMBALTA) 30 MG capsule Take 30 mg by mouth daily.     ELDERBERRY PO Take 1 tablet by mouth 2 (two) times a week.  empagliflozin (JARDIANCE) 25 MG TABS tablet Take 1 tablet by mouth daily.     gabapentin (NEURONTIN) 600 MG tablet Take 600 mg by mouth 2 (two) times daily.     glimepiride (AMARYL) 4 MG tablet Take 4 mg by mouth 2 (two) times daily before lunch and supper.      levothyroxine (SYNTHROID, LEVOTHROID) 100 MCG tablet Take 100 mcg by mouth daily.      Multiple Vitamin (MULTIVITAMIN WITH MINERALS) TABS tablet Take 1 tablet by mouth daily. Centrum Silver     Multiple Vitamins-Minerals (OCUVITE PO) Take 1 tablet by mouth daily.     Multiple Vitamins-Minerals (ZINC PO) Take 1 tablet by mouth 2 (two) times a week.     omeprazole (PRILOSEC) 20 MG capsule Take 20 mg by mouth daily.       sacubitril-valsartan (ENTRESTO) 24-26 MG Take 1 tablet by mouth 2 (two) times daily. 60 tablet 2   spironolactone (ALDACTONE) 25 MG tablet Take 0.5 tablets (12.5 mg total) by mouth daily. 45 tablet 1   torsemide (DEMADEX) 20 MG tablet Take 40 mg by mouth daily.      Semaglutide,0.25 or 0.5MG/DOS, (OZEMPIC, 0.25 OR 0.5 MG/DOSE,) 2 MG/1.5ML SOPN Inject 0.5 mg into the skin every Friday.  (Patient not taking: Reported on 10/17/2020)     No current facility-administered medications for this visit.    Review of Systems    Chronic, stable dyspnea on exertion.  Occasional palpitation, especially at night.  She denies chest pain, PND, orthopnea, dizziness, syncope, edema, or early satiety.  All other systems reviewed and are otherwise negative except as noted above.  Physical Exam    VS:  BP 98/64 (BP Location: Left Arm, Patient Position: Sitting, Cuff Size: Normal)   Pulse 76   Ht 4' 11"  (1.499 m)   Wt 204 lb (92.5 kg)   SpO2 96%   BMI 41.20 kg/m  , BMI Body mass index is 41.2 kg/m.     GEN: Obese, in no acute distress. HEENT: normal. Neck: Supple, no JVD, carotid bruits, or masses. Cardiac: RRR, 2/6 systolic murmur at the apex, no rubs, or gallops. No clubbing, cyanosis, edema.  Radials/PT 2+ and equal bilaterally.  Respiratory:  Respirations regular and unlabored, clear to auscultation bilaterally. GI: Obese, soft, nontender, nondistended, BS + x 4. MS: no deformity or atrophy. Skin: warm and dry, no rash. Neuro:  Strength and sensation are intact. Psych: Normal affect.  Accessory Clinical Findings    ECG personally reviewed by me today -regular sinus rhythm, 76, PVC, left axis deviation, IVCD, poor R wave progression- no acute changes.  Labs reviewed in care everywhere: October 02, 2020 Hemoglobin A1c 6.6  September 11, 2020 Hemoglobin 12.3, hematocrit 38.6, WBC 5.1, platelets 114 Sodium 140, potassium 4.6, chloride 102, CO2 30.7, BUN 24, creatinine 1.3, glucose 79 Total bilirubin 0.8, alkaline phosphatase 65, AST 20, ALT 13 Total protein 6.8, calcium 10.0, albumin 4.0 Total cholesterol 102, triglycerides 48, HDL 50.1, LDL 42 TSH 2.140  Assessment & Plan    1.  Paroxysmal atrial fibrillation: Status post recent cardioversion on June 27.  She  is maintaining sinus rhythm and remains on low-dose beta-blocker therapy as well as Eliquis.  2.  HFrEF/nonischemic cardiomyopathy: Long history dating back to the late 90s with recent drop in EF to 20-25% by echo in May.  Zio monitoring revealed a PVC burden of just 8.1%, thus it was not felt to be significantly contributing.  Only 1 PVC on twelve-lead today.  Her  medical regimen has been adjusted over the past 2 months and now includes carvedilol 3.125 mg twice daily, Entresto 24-26 mg twice daily, spironolactone 25 mg daily, and empagliflozin 25 mg daily.  Her weight has been stable at home and she is euvolemic on examination today.  She does admit to some dietary indiscretions.  We discussed the importance of daily weights, sodium restriction, medication compliance, and symptom reporting and she verbalizes understanding.   3.  Essential hypertension: Blood pressure soft at 98/64.  She notes that she typically runs in the 100s to 1 teens range.  No changes today.  4.  Type 2 diabetes mellitus: Followed by endocrinology.  Ozempic recently switched to dapagliflozin in the setting of above.  5.  Obstructive sleep apnea: Compliant with CPAP.  6.  PVCs/palpitations: Occasionally notes a palpitation while lying in bed at night but overall stable.  Continue beta-blocker therapy.  7.  Disposition: Follow-up in clinic in 2 months or sooner if necessary.   Murray Hodgkins, NP 10/17/2020, 5:58 PM

## 2020-10-17 NOTE — Patient Instructions (Signed)
Medication Instructions:  No changes at this time.  *If you need a refill on your cardiac medications before your next appointment, please call your pharmacy*   Lab Work: None  If you have labs (blood work) drawn today and your tests are completely normal, you will receive your results only by: [place] (if you have MyChart) OR A paper copy in the mail If you have any lab test that is abnormal or we need to change your treatment, we will call you to review the results.   Testing/Procedures: None    Follow-Up: At [facility], you and your health needs are our priority.  As part of our continuing mission to provide you with exceptional heart care, we have created designated Provider Care Teams.  These Care Teams include your primary Cardiologist (physician) and Advanced Practice Providers (APPs -  Physician Assistants and Nurse Practitioners) who all work together to provide you with the care you need, when you need it.  We recommend signing up for the patient portal called "MyChart".  Sign up information is provided on this After Visit Summary.  MyChart is used to connect with patients for Virtual Visits (Telemedicine).  Patients are able to view lab/test results, encounter notes, upcoming appointments, etc.  Non-urgent messages can be sent to your provider as well.   To learn more about what you can do with MyChart, go to [email or web address].    Your next appointment:   2 month(s)  The format for your next appointment:   In Person  Provider:   [name], MD or Murray Hodgkins, NP

## 2020-10-19 ENCOUNTER — Other Ambulatory Visit: Payer: Self-pay | Admitting: Internal Medicine

## 2020-10-27 ENCOUNTER — Ambulatory Visit (INDEPENDENT_AMBULATORY_CARE_PROVIDER_SITE_OTHER): Payer: Medicare HMO

## 2020-10-27 DIAGNOSIS — I5022 Chronic systolic (congestive) heart failure: Secondary | ICD-10-CM

## 2020-10-27 DIAGNOSIS — Z95810 Presence of automatic (implantable) cardiac defibrillator: Secondary | ICD-10-CM

## 2020-10-27 NOTE — Progress Notes (Signed)
EPIC Encounter for ICM Monitoring  Patient Name: Katherine Rosales is a 70 y.o. female Date: 10/27/2020 Primary Care Physican: Dion Body, MD Primary Cardiologist: [name] Electrophysiologist: [name] 10/27/2020 Weight 204 lbs     Spoke with patient and reports breathing is at baseline for her.  She reports weight is up over the last several weeks but she is not consistently weighing at home.      Optivol thoracic impedance suggesting possible fluid accumulation startring 6/19 with except of 2 days at baseline.   Prescribed: Torsemide 40 mg take 1 tablet (40 mg total) by mouth daily.   Labs: 07/07/2020 Creatinine 1.28, BUN 22, Potassium 3.8, Sodium 139, GFR 45 A complete set of results can be found in Results Review.   Recommendations:  Patient reports Ignacia Bayley, [place] gave her permission to take extra Torsemide when needed and will take extra tablet over the next couple of days.     Follow-up plan: ICM clinic phone appointment on 11/10/2020 to recheck fluid levels.   91 day device clinic remote transmission 11/10/2020.    EP/Cardiology Office Visits: 02/03/2021 with Dr [name].   Copy of ICM check sent to Dr. [name]     3 month ICM trend: 10/27/2020.    1 Year ICM trend:       [name], RN 10/27/2020 4:30 PM

## 2020-11-03 ENCOUNTER — Other Ambulatory Visit: Payer: Self-pay | Admitting: Family Medicine

## 2020-11-03 DIAGNOSIS — Z1231 Encounter for screening mammogram for malignant neoplasm of breast: Secondary | ICD-10-CM

## 2020-11-10 ENCOUNTER — Ambulatory Visit (INDEPENDENT_AMBULATORY_CARE_PROVIDER_SITE_OTHER): Payer: Medicare HMO

## 2020-11-10 DIAGNOSIS — I5022 Chronic systolic (congestive) heart failure: Secondary | ICD-10-CM | POA: Diagnosis not present

## 2020-11-10 DIAGNOSIS — Z95810 Presence of automatic (implantable) cardiac defibrillator: Secondary | ICD-10-CM

## 2020-11-10 NOTE — Progress Notes (Signed)
EPIC Encounter for ICM Monitoring  Patient Name: Katherine Rosales is a 70 y.o. female Date: 11/10/2020 Primary Care Physican: Dion Body, MD Primary Cardiologist: [name] Electrophysiologist: [name] 10/27/2020 Weight 204 lbs     Spoke with patient and heart failure questions reviewed.  Pt asymptomatic for fluid accumulation and feeling well.   Optivol thoracic impedance suggesting possible fluid levels returned to normal after taking extra Torsemide.   Prescribed: Torsemide 40 mg take 1 tablet (40 mg total) by mouth daily. Pt reports she can take extra Torsemide when needed as discussed at [place] with Ignacia Bayley, NP.   Labs: 07/07/2020 Creatinine 1.28, BUN 22, Potassium 3.8, Sodium 139, GFR 45 A complete set of results can be found in Results Review.   Recommendations:  No changes and encouraged to call if experiencing any fluid symptoms.   Follow-up plan: ICM clinic phone appointment on 12/01/2020.   91 day device clinic remote transmission 02/09/2021.    EP/Cardiology Office Visits: 02/03/2021 with Dr [name].   Copy of ICM check sent to Dr. [name].   3 month ICM trend: 11/09/2020.    1 Year ICM trend:       Katherine Billings, RN 11/10/2020 4:48 PM

## 2020-11-12 LAB — CUP PACEART REMOTE DEVICE CHECK
Battery Remaining Longevity: 44 mo
Battery Voltage: 2.96 V
Brady Statistic RV Percent Paced: 1.05 %
Date Time Interrogation Session: 20220808022827
HighPow Impedance: 85 Ohm
Implantable Lead Implant Date: 20140813
Implantable Lead Location: 753860
Implantable Lead Model: 6935
Implantable Pulse Generator Implant Date: 20140813
Lead Channel Impedance Value: 342 Ohm
Lead Channel Impedance Value: 399 Ohm
Lead Channel Pacing Threshold Amplitude: 0.75 V
Lead Channel Pacing Threshold Pulse Width: 0.4 ms
Lead Channel Sensing Intrinsic Amplitude: 13.375 mV
Lead Channel Sensing Intrinsic Amplitude: 13.375 mV
Lead Channel Setting Pacing Amplitude: 2 V
Lead Channel Setting Pacing Pulse Width: 0.4 ms
Lead Channel Setting Sensing Sensitivity: 0.3 mV

## 2020-11-13 ENCOUNTER — Ambulatory Visit
Admission: RE | Admit: 2020-11-13 | Discharge: 2020-11-13 | Disposition: A | Discharge status: Home / Self Care | Payer: Medicare HMO | Source: Ambulatory Visit | Attending: Family Medicine | Admitting: Family Medicine

## 2020-11-13 ENCOUNTER — Other Ambulatory Visit: Payer: Self-pay

## 2020-11-13 ENCOUNTER — Other Ambulatory Visit: Payer: Self-pay | Admitting: Gastroenterology

## 2020-11-13 DIAGNOSIS — Z1231 Encounter for screening mammogram for malignant neoplasm of breast: Secondary | ICD-10-CM | POA: Diagnosis not present

## 2020-11-13 DIAGNOSIS — K7460 Unspecified cirrhosis of liver: Secondary | ICD-10-CM

## 2020-11-13 DIAGNOSIS — K219 Gastro-esophageal reflux disease without esophagitis: Secondary | ICD-10-CM | POA: Diagnosis not present

## 2020-11-21 ENCOUNTER — Other Ambulatory Visit: Payer: Self-pay | Admitting: Internal Medicine

## 2020-11-28 ENCOUNTER — Other Ambulatory Visit: Payer: Self-pay

## 2020-11-28 ENCOUNTER — Ambulatory Visit
Admission: RE | Admit: 2020-11-28 | Discharge: 2020-11-28 | Disposition: A | Discharge status: Home / Self Care | Payer: Medicare HMO | Source: Ambulatory Visit | Attending: Gastroenterology | Admitting: Gastroenterology

## 2020-11-28 DIAGNOSIS — K7460 Unspecified cirrhosis of liver: Secondary | ICD-10-CM | POA: Diagnosis not present

## 2020-11-28 DIAGNOSIS — R161 Splenomegaly, not elsewhere classified: Secondary | ICD-10-CM | POA: Diagnosis not present

## 2020-12-01 ENCOUNTER — Ambulatory Visit (INDEPENDENT_AMBULATORY_CARE_PROVIDER_SITE_OTHER): Payer: Medicare HMO

## 2020-12-01 DIAGNOSIS — I5022 Chronic systolic (congestive) heart failure: Secondary | ICD-10-CM

## 2020-12-01 DIAGNOSIS — Z95810 Presence of automatic (implantable) cardiac defibrillator: Secondary | ICD-10-CM | POA: Diagnosis not present

## 2020-12-01 NOTE — Progress Notes (Signed)
EPIC Encounter for ICM Monitoring  Patient Name: Katherine Rosales is a 70 y.o. female Date: 12/01/2020 Primary Care Physican: Dion Body, MD Primary Cardiologist: [name] Electrophysiologist: [name] 10/27/2020 Weight 204 lbs 12/01/2020 Weight: 206 lbs     Spoke with patient and heart failure questions reviewed.  Pt asymptomatic for fluid accumulation and feeling well.   Optivol thoracic impedance suggesting possible fluid accumlation starting since 8/19.   Prescribed: Torsemide 40 mg take 1 tablet (40 mg total) by mouth daily. Pt reports she can take extra Torsemide when needed as discussed at [place] with Ignacia Bayley, NP.   Labs: 07/07/2020 Creatinine 1.28, BUN 22, Potassium 3.8, Sodium 139, GFR 45 A complete set of results can be found in Results Review.   Recommendations:  She took extra Furosemide this AM 8/29 due to weight increase.    Follow-up plan: ICM clinic phone appointment on 12/15/2020 to recheck fluid levels.   91 day device clinic remote transmission 02/09/2021.    EP/Cardiology Office Visits: 02/03/2021 with Dr [name].   Copy of ICM check sent to Dr. [name].   3 month ICM trend: 12/01/2020.    1 Year ICM trend:       [name], RN 12/01/2020 3:19 PM

## 2020-12-03 DIAGNOSIS — K7460 Unspecified cirrhosis of liver: Secondary | ICD-10-CM | POA: Diagnosis not present

## 2020-12-03 NOTE — Progress Notes (Signed)
Remote ICD transmission.   

## 2020-12-15 ENCOUNTER — Ambulatory Visit (INDEPENDENT_AMBULATORY_CARE_PROVIDER_SITE_OTHER): Payer: Medicare HMO

## 2020-12-15 DIAGNOSIS — I5022 Chronic systolic (congestive) heart failure: Secondary | ICD-10-CM

## 2020-12-15 DIAGNOSIS — Z95810 Presence of automatic (implantable) cardiac defibrillator: Secondary | ICD-10-CM | POA: Diagnosis not present

## 2020-12-15 NOTE — Progress Notes (Signed)
EPIC Encounter for ICM Monitoring  Patient Name: Katherine Rosales is a 70 y.o. female Date: 12/15/2020 Primary Care Physican: Dion Body, MD Primary Cardiologist: [name] Electrophysiologist: [name] 10/27/2020 Weight 204 lbs 12/01/2020 Weight: 206 lbs 12/15/2020 Weight: 206 lbs     Spoke with patient and heart failure questions reviewed.  Pt feels a little Katherine Rosales of breath.   Optivol thoracic impedance suggesting possible fluid accumlation starting since 8/19.   Prescribed: Torsemide 40 mg take 1 tablet (40 mg total) by mouth daily. Pt reports she can take extra Torsemide when needed as discussed at [place] with Katherine Bayley, NP.   Labs: 07/07/2020 Creatinine 1.28, BUN 22, Potassium 3.8, Sodium 139, GFR 45 A complete set of results can be found in Results Review.   Recommendations:  She will adjust Torsemide and take extra tablet x 3 days.  Advised to limit salt and fluid intake.      Follow-up plan: ICM clinic phone appointment on 12/19/2020 (manual) to recheck fluid levels.   91 day device clinic remote transmission 02/09/2021.    EP/Cardiology Office Visits: 02/03/2021 with Dr [name].   Copy of ICM check sent to Dr. [name].    3 month ICM trend: 12/15/2020.    1 Year ICM trend:       Katherine Billings, RN 12/15/2020 4:12 PM

## 2020-12-19 ENCOUNTER — Ambulatory Visit (INDEPENDENT_AMBULATORY_CARE_PROVIDER_SITE_OTHER): Payer: Medicare HMO

## 2020-12-19 DIAGNOSIS — I5022 Chronic systolic (congestive) heart failure: Secondary | ICD-10-CM

## 2020-12-19 DIAGNOSIS — Z95810 Presence of automatic (implantable) cardiac defibrillator: Secondary | ICD-10-CM

## 2020-12-19 NOTE — Progress Notes (Signed)
EPIC Encounter for ICM Monitoring  Patient Name: Katherine Rosales is a 70 y.o. female Date: 12/19/2020 Primary Care Physican: Dion Body, MD Primary Cardiologist: [name] Electrophysiologist: [name] 10/27/2020 Weight 204 lbs 12/01/2020 Weight: 206 lbs 12/15/2020 Weight: 206 lbs 12/19/2020 Weight: 206 lbs     Spoke with patient and heart failure questions reviewed.   Patient denies any fluid symptoms.  She has been taking extra Torsemide x 4 days.  She reports limiting fluid and salt intake as recommended.    Optivol thoracic impedance suggesting possible fluid accumlation starting since 8/19.   Prescribed: Torsemide 40 mg take 1 tablet (40 mg total) by mouth daily. Pt reports she can take extra Torsemide when needed as discussed at [place] with Ignacia Bayley, NP.   Labs: 07/07/2020 Creatinine 1.28, BUN 22, Potassium 3.8, Sodium 139, GFR 45 A complete set of results can be found in Results Review.   Recommendations:  Advised to returned to prescribed Torsemide dosage and monitor for fluid symptoms.  Advised to call back if fluid symptoms occur or go to ER.        Follow-up plan: ICM clinic phone appointment on 01/05/2021.   91 day device clinic remote transmission 02/09/2021.    EP/Cardiology Office Visits: 02/03/2021 with Dr [name].   Copy of ICM check sent to Dr. [name].     3 month ICM trend: 12/19/2020.    1 Year ICM trend:       [name], RN 12/19/2020 11:20 AM

## 2020-12-22 ENCOUNTER — Ambulatory Visit (INDEPENDENT_AMBULATORY_CARE_PROVIDER_SITE_OTHER): Payer: Medicare HMO

## 2020-12-22 DIAGNOSIS — I5022 Chronic systolic (congestive) heart failure: Secondary | ICD-10-CM

## 2020-12-22 DIAGNOSIS — Z95810 Presence of automatic (implantable) cardiac defibrillator: Secondary | ICD-10-CM

## 2020-12-23 NOTE — Progress Notes (Signed)
EPIC Encounter for ICM Monitoring  Patient Name: Katherine Rosales is a 70 y.o. female Date: 12/23/2020 Primary Care Physican: Dion Body, MD Primary Cardiologist: [name] Electrophysiologist: [name] 12/23/2020 Weight: 206 lbs         Spoke with patient and heart failure questions reviewed.   Patient denies any fluid symptoms.      Optivol thoracic impedance suggesting fluid levels returned to normal after taking extra Torsemide.   Prescribed: Torsemide 40 mg take 1 tablet (40 mg total) by mouth daily. Pt reports she can take extra Torsemide when needed as discussed at [place] with Katherine Bayley, NP.   Labs: 07/07/2020 Creatinine 1.28, BUN 22, Potassium 3.8, Sodium 139, GFR 45 A complete set of results can be found in Results Review.   Recommendations:  No changes and encouraged to call if experiencing any fluid symptoms.   Follow-up plan: ICM clinic phone appointment on 01/19/2021.   91 day device clinic remote transmission 02/09/2021.    EP/Cardiology Office Visits: 02/03/2021 with Dr [name].   Copy of ICM check sent to Dr. [name].   3 month ICM trend: 12/22/2020.    1 Year ICM trend:       [name], RN 12/23/2020 4:49 PM

## 2021-01-16 ENCOUNTER — Other Ambulatory Visit: Payer: Self-pay

## 2021-01-16 MED ORDER — ENTRESTO 24-26 MG PO TABS: 1.0000 | ORAL_TABLET | Freq: Two times a day (BID) | ORAL | 0 refills | Status: DC

## 2021-01-19 ENCOUNTER — Ambulatory Visit (INDEPENDENT_AMBULATORY_CARE_PROVIDER_SITE_OTHER): Payer: Medicare HMO

## 2021-01-19 DIAGNOSIS — Z95810 Presence of automatic (implantable) cardiac defibrillator: Secondary | ICD-10-CM | POA: Diagnosis not present

## 2021-01-19 DIAGNOSIS — I5022 Chronic systolic (congestive) heart failure: Secondary | ICD-10-CM

## 2021-01-19 NOTE — Progress Notes (Signed)
EPIC Encounter for ICM Monitoring  Patient Name: Katherine Rosales is a 70 y.o. female Date: 01/19/2021 Primary Care Physican: Dion Body, MD Primary Cardiologist: [name] Electrophysiologist: [name] 12/23/2020 Weight: 206 lbs 01/19/2021 Weight: 210 lbs          Spoke with patient and heart failure questions reviewed.   Patient denies any fluid symptoms.  She is taking extra Torsemide on most days.    Optivol thoracic impedance suggesting possible fluid accumulation starting 8/15 with a few days at baseline.   Prescribed: Torsemide 40 mg take 1 tablet (40 mg total) by mouth daily. Pt reports she can take extra Torsemide when needed as discussed at [place] with Ignacia Bayley, NP.   Labs: 07/07/2020 Creatinine 1.28, BUN 22, Potassium 3.8, Sodium 139, GFR 45 A complete set of results can be found in Results Review.   Recommendations:  Advised to review foods to determine if she is eating too much salt and encouraged to limit fluid intake to 64 oz.  No changes and encouraged to call if experiencing any fluid symptoms.   Follow-up plan: ICM clinic phone appointment on 01/26/2021 to recheck fluid levels.   91 day device clinic remote transmission 02/09/2021.    EP/Cardiology Office Visits: 02/03/2021 with Dr [name].   Copy of ICM check sent to Dr. [name].    3 month ICM trend: 01/19/2021.    1 Year ICM trend:       [name], RN 01/19/2021 5:06 PM

## 2021-01-26 ENCOUNTER — Ambulatory Visit (INDEPENDENT_AMBULATORY_CARE_PROVIDER_SITE_OTHER): Payer: Medicare HMO

## 2021-01-26 DIAGNOSIS — Z95810 Presence of automatic (implantable) cardiac defibrillator: Secondary | ICD-10-CM

## 2021-01-26 DIAGNOSIS — I5022 Chronic systolic (congestive) heart failure: Secondary | ICD-10-CM

## 2021-01-27 NOTE — Progress Notes (Signed)
EPIC Encounter for ICM Monitoring  Patient Name: Katherine Rosales is a 70 y.o. female Date: 01/27/2021 Primary Care Physican: Dion Body, MD Primary Cardiologist: [name] Electrophysiologist: [name] 12/23/2020 Weight: 206 lbs 01/19/2021 Weight: 210 lbs 01/27/2021 Weight: 206 lbs          Spoke with patient and heart failure questions reviewed.   Patient reports SOB has resolved and feeling better since fluid levels returned normal.    Optivol thoracic impedance suggesting possible fluid accumulation starting 8/15 with a few days at baseline.   Prescribed: Torsemide 40 mg take 1 tablet (40 mg total) by mouth daily. Pt reports she can take extra Torsemide when needed as discussed at [place] with Ignacia Bayley, NP.   Labs: 07/07/2020 Creatinine 1.28, BUN 22, Potassium 3.8, Sodium 139, GFR 45 A complete set of results can be found in Results Review.   Recommendations:  No changes and encouraged to call if experiencing any fluid symptoms.   Follow-up plan: ICM clinic phone appointment on 03/02/2021.   91 day device clinic remote transmission 02/09/2021.    EP/Cardiology Office Visits: 02/03/2021 with Dr [name].   Copy of ICM check sent to Dr. [name].    3 month ICM trend: 01/26/2021.    1 Year ICM trend:       [name], RN 01/27/2021 2:30 PM

## 2021-02-03 ENCOUNTER — Other Ambulatory Visit: Payer: Self-pay

## 2021-02-03 ENCOUNTER — Ambulatory Visit (INDEPENDENT_AMBULATORY_CARE_PROVIDER_SITE_OTHER): Payer: Medicare HMO

## 2021-02-03 ENCOUNTER — Ambulatory Visit: Payer: Medicare HMO | Admitting: Internal Medicine

## 2021-02-03 ENCOUNTER — Encounter: Payer: Self-pay | Admitting: Internal Medicine

## 2021-02-03 VITALS — BP 110/74 | HR 81 | Ht 59.0 in | Wt 206.0 lb

## 2021-02-03 DIAGNOSIS — I10 Essential (primary) hypertension: Secondary | ICD-10-CM

## 2021-02-03 DIAGNOSIS — I4819 Other persistent atrial fibrillation: Secondary | ICD-10-CM | POA: Diagnosis not present

## 2021-02-03 DIAGNOSIS — I493 Ventricular premature depolarization: Secondary | ICD-10-CM | POA: Diagnosis not present

## 2021-02-03 DIAGNOSIS — I428 Other cardiomyopathies: Secondary | ICD-10-CM | POA: Diagnosis not present

## 2021-02-03 DIAGNOSIS — I5022 Chronic systolic (congestive) heart failure: Secondary | ICD-10-CM | POA: Diagnosis not present

## 2021-02-03 DIAGNOSIS — Z95810 Presence of automatic (implantable) cardiac defibrillator: Secondary | ICD-10-CM | POA: Diagnosis not present

## 2021-02-03 LAB — PACEMAKER DEVICE OBSERVATION

## 2021-02-03 NOTE — Progress Notes (Signed)
Patient ID: Katherine Rosales, female   DOB: September 21, 1950, 70 y.o.   MRN: [id number] Patient Care Team: Dion Body, MD as PCP - General (Family Medicine) Deboraha Sprang, MD as PCP - Cardiology (Cardiology)   HPI  Katherine Rosales is a 70 y.o. female Seen in followup for Medtronic  ICD implanted for nonischemic cardiomyopathy and primary prevention. She has chronic systolic failure. Generator replacement in 8/14.  10/21 acute heart failure associated with atrial fibrillation with a rapid rate.  Temporally related to discontinuing her CPAP  Now anticoagulated with Eliquis without  bleeding  Seen 4/22 with increasing burden of nonsustained ventricular tachycardia.  Resumed her Aldactone 12.5 and transition her back from metoprolol--carvedilol.  In the interval started her on Entresto and she is considerably better.    The patient denies chest pain, nocturnal dyspnea, orthopnea or peripheral edema.  There have been no palpitations, lightheadedness or syncope.  Complains of dyspnea on exertion but back is the more limiting.   No interval afib of which she is aware       She notes she doesn't use her Cpap at night   Date Cr K Hgb  8/19 1.6 4.5 11.7  3/20 1/2  4.4 11.9  10/20 1.2 4.1 11.6  5/21 1.3 4.6 (2/21) 11.8   12/21 1.31 3.8 (10/21)12.5  4/22 1.28 3.8       DATE TEST EF%   6/14 Echo   20-25 %   9/20 Echo   30-35 %   5/22 Echo 20-25%       Past Medical History:  Diagnosis Date   6949 Lead-Medtronic    Anemia 11/12/2016   Anxiety 12/25/2013   Automatic implantable cardioverter-defibrillator in situ    Breast cancer ([place]) 03/1995   right breast ca with lumpectomy and chemo and rad   Cardiomyopathy, nonischemic ([place])    a. Prior cath w/ nonobs CAD; b. s/p AICD w/ subsequent upgrade 11/2012-> MDT Ever[place] XT VR single lead ICD (ser # [id number]); c. 12/2018 Echo: EF 30-35%; d. 08/2020 Echo: EF 20-25%, glob HK. Mod elev RVSP. Mod dil LA, mild to mod RAE. Mod MR/TR. Mild-mod AoV  sclerosis.   Chronic HFrEF (heart failure with reduced ejection fraction) ([place])    a. 12/2018 Echo: EF 30-35%; b. 08/2020 Echo: EF 20-25%, glob HK.   CKD (chronic kidney disease), stage III ([place]) 11/12/2016   Depression    Diabetes mellitus    Type II Diabetes   Hypertension    Hypothyroidism    ICD (implantable cardiac defibrillator), singleMedtronic    NASH (nonalcoholic steatohepatitis)    OSA (obstructive sleep apnea)    a. On CPAP.   PAF (paroxysmal atrial fibrillation) ([place])    a. 09/2020 s/p DCCV.   Personal history of chemotherapy 1996   right breast ca   Personal history of radiation therapy 1996   right breast ca   PVC's (premature ventricular contractions)    a. 08/2020 Zio: 8.1% PVC burden.   Sleep apnea    On CPAP   Spleen enlarged    Systolic heart failure, chronic ([place])     Past Surgical History:  Procedure Laterality Date   ABDOMINAL HYSTERECTOMY     BREAST BIOPSY Right 11/26/2015   - FAT NECROSIS, FIBROSIS, AND CHRONIC INFLAMMATION.    BREAST BIOPSY Right 12/20/2018   Affirm Bx- X-clip- path pending   BREAST BIOPSY WITH RADIO FREQUENCY LOCALIZER Right 10/18/2019   Procedure: RF TAG GUIDED EXCISIONAL BREAST BIOPSY;  Surgeon: Benjamine Sprague, DO;  Location: [facility] ORS;  Service: General;  Laterality: Right;   BREAST EXCISIONAL BIOPSY Right 03/1995   + breast ca chemo and rad. 9:00 region   BREAST EXCISIONAL BIOPSY Right 2011   surgical bx neg  11:00 region   BREAST LUMPECTOMY Right 1996   CARDIOVERSION N/A 02/13/2020   Procedure: CARDIOVERSION;  Surgeon: Kate Sable, MD;  Location: [facility] ORS;  Service: Cardiovascular;  Laterality: N/A;   CARDIOVERSION N/A 09/29/2020   Procedure: CARDIOVERSION;  Surgeon: Wellington Hampshire, MD;  Location: [facility] ORS;  Service: Cardiovascular;  Laterality: N/A;   CHOLECYSTECTOMY     COLONOSCOPY  03/05/2013   COLONOSCOPY W/ POLYPECTOMY  03/05/2013   COLONOSCOPY WITH PROPOFOL N/A 10/03/2017   Procedure: COLONOSCOPY WITH PROPOFOL;   Surgeon: Manya Silvas, MD;  Location: [facility] ENDOSCOPY;  Service: Endoscopy;  Laterality: N/A;   ESOPHAGOGASTRODUODENOSCOPY  01/11/2003, 06/25/2015   ESOPHAGOGASTRODUODENOSCOPY (EGD) WITH PROPOFOL N/A 06/25/2015   Procedure: ESOPHAGOGASTRODUODENOSCOPY (EGD) WITH PROPOFOL;  Surgeon: Manya Silvas, MD;  Location: [facility] ENDOSCOPY;  Service: Endoscopy;  Laterality: N/A;   FOOT SURGERY Right    IMPLANTABLE CARDIOVERTER DEFIBRILLATOR (ICD) GENERATOR CHANGE N/A 11/15/2012   Procedure: ICD GENERATOR CHANGE;  Surgeon: Deboraha Sprang, MD;  Location: [facility] CATH LAB;  Service: Cardiovascular;  Laterality: N/A;   JOINT REPLACEMENT  2009   Total Knee Replacement, Left   LEAD REVISION N/A 11/15/2012   Procedure: LEAD REVISION;  Surgeon: Deboraha Sprang, MD;  Location: [facility] CATH LAB;  Service: Cardiovascular;  Laterality: N/A;   PACEMAKER INSERTION  06/30/2005   Medtronic Virtuoso VR ICD - Nonischemic cardiomyopathy potentially related to adriamycin, class 3 congestive failture with a narrow QRS and a negative TDI   TONSILLECTOMY     TUBAL LIGATION      Current Outpatient Medications  Medication Sig Dispense Refill   alendronate (FOSAMAX) 70 MG tablet Take 70 mg by mouth every Thursday.     apixaban (ELIQUIS) 5 MG TABS tablet TAKE 1 TABLET BY MOUTH TWICE DAILY 60 tablet 5   Ascorbic Acid (VITAMIN C) 1000 MG tablet Take 1,000 mg by mouth daily.     Calcium Carb-Cholecalciferol (CALCIUM 600 + D PO) Take 1 tablet by mouth 2 (two) times daily.     carvedilol (COREG) 3.125 MG tablet Take 1 tablet (3.125 mg total) by mouth 2 (two) times daily. 60 tablet 5   CINNAMON PO Take 1,000 mg by mouth daily.     docusate sodium (COLACE) 100 MG capsule Take 300 mg by mouth at bedtime.      DULoxetine (CYMBALTA) 30 MG capsule Take 30 mg by mouth daily.     ELDERBERRY PO Take 1 tablet by mouth 2 (two) times a week.     empagliflozin (JARDIANCE) 25 MG TABS tablet Take 1 tablet by mouth daily.     gabapentin (NEURONTIN) 600 MG  tablet Take 600 mg by mouth 2 (two) times daily.     glimepiride (AMARYL) 4 MG tablet Take 4 mg by mouth 2 (two) times daily before lunch and supper.      levothyroxine (SYNTHROID, LEVOTHROID) 100 MCG tablet Take 100 mcg by mouth daily.      Multiple Vitamin (MULTIVITAMIN WITH MINERALS) TABS tablet Take 1 tablet by mouth daily. Centrum Silver     Multiple Vitamins-Minerals (OCUVITE PO) Take 1 tablet by mouth daily.     Multiple Vitamins-Minerals (ZINC PO) Take 1 tablet by mouth 2 (two) times a week.     omeprazole (PRILOSEC) 20 MG capsule Take  20 mg by mouth daily.       sacubitril-valsartan (ENTRESTO) 24-26 MG Take 1 tablet by mouth 2 (two) times daily. 180 tablet 0   spironolactone (ALDACTONE) 25 MG tablet TAKE (1/2) TABLET BY MOUTH ONCE DAILY. 45 tablet 0   torsemide (DEMADEX) 20 MG tablet Take 40 mg by mouth daily.     Semaglutide,0.25 or 0.5MG/DOS, (OZEMPIC, 0.25 OR 0.5 MG/DOSE,) 2 MG/1.5ML SOPN Inject 0.5 mg into the skin every Friday.  (Patient not taking: No sig reported)     No current facility-administered medications for this visit.   Allergies  Allergen Reactions   Ibuprofen Other (See Comments)    Avoid due to stage 3 kidney disease     Review of Systems negative except from HPI and PMH  Physical Exam: BP 110/74 (BP Location: Left Arm, Patient Position: Sitting, Cuff Size: Normal)   Pulse 81   Ht 4' 11"  (1.499 m)   Wt 206 lb (93.4 kg)   SpO2 98%   BMI 41.61 kg/m  Well developed and well nourished in no acute distress HENT normal Neck JVT -7  Lungs Clear Device pocket well healed; without hematoma or erythema.  There is no tethering  Irregular irregularly  rate and rhythm, no murmur Abd-soft with active BS No Clubbing cyanosis  No edema Skin-warm and dry A & Oriented  Grossly normal sensory and motor function  ECG: Sinus at 70 Intervals 19/13/43 Frequent ectopics some of which are PVCs many of which appear to be PACs,   Assessment and  Plan  Nonischemic  cardiomyopathy  Heart failure chronic systolic  Hypertension      PVCs/VT nonsustained-increased burden  Implantable defibrillator-Medtronic        Sleep apnea  -- compliant  Atrial fibrillation-persistent holding sinus rhythm  VT nonsustained by device detection   No interval atrial fibrillation.  Continue with Eliquis 5 mg twice daily  Continue her on guideline directed therapy for her cardiomyopathy, Entresto 24/26 carvedilol 3.125 and Aldactone 12.5 Also on Jardiance now 25 mg a day we will continue VT burden appears to   be an issue with a single-chamber device its hard to measure, will use a Zio patch for 7 day yo try and discriminate between the atrial ectopy and the ventricular ectopy Though it may matter less than I thought with burden of about 20% combined arrhythmic suppression may be appropriate  would use amio   Encouraged her to discuss with PCP weight loss strategies--quick review suggests taht keto diet is safe with DM2

## 2021-02-03 NOTE — Patient Instructions (Addendum)
Medication Instructions:  - Your physician has recommended you make the following change in your medication:   1) INCREASE demadex (torsemide) 20 mg: - take 3 tablets (60 mg) on Mondays/ Wednesdays/ and Fridays - take 2 tablets (40 mg) on all other days   *If you need a refill on your cardiac medications before your next appointment, please call your pharmacy*   Lab Work: - none ordered  If you have labs (blood work) drawn today and your tests are completely normal, you will receive your results only by: [place] (if you have MyChart) OR A paper copy in the mail If you have any lab test that is abnormal or we need to change your treatment, we will call you to review the results.   Testing/Procedures:  1) Heart Monitor: 7 days  - Placed in office today - Dates of wear: 02/03/21- 02/10/21  Your physician has recommended that you wear a Zio XT (heart) monitor.   This monitor is a medical device that records the heart's electrical activity. Doctors most often use these monitors to diagnose arrhythmias. Arrhythmias are problems with the speed or rhythm of the heartbeat. The monitor is a small device applied to your chest. You can wear one while you do your normal daily activities. While wearing this monitor if you have any symptoms to push the button and record what you felt. Once you have worn this monitor for the period of time provider prescribed (Usually 14 days), you will return the monitor device in the postage paid box. Once it is returned they will download the data collected and provide [place] with a report which the provider will then review and we will call you with those results. Important tips:  Avoid showering during the first 24 hours of wearing the monitor. Avoid excessive sweating to help maximize wear time. Do not submerge the device, no hot tubs, and no swimming pools. Keep any lotions or oils away from the patch. After 24 hours you may shower with the patch on. Take  brief showers with your back facing the shower head.  Do not remove patch once it has been placed because that will interrupt data and decrease adhesive wear time. Push the button when you have any symptoms and write down what you were feeling. Once you have completed wearing your monitor, remove and place into box which has postage paid and place in your outgoing mailbox.  If for some reason you have misplaced your box then call our office and we can provide another box and/or mail it off for you.      Follow-Up: At [facility], you and your health needs are our priority.  As part of our continuing mission to provide you with exceptional heart care, we have created designated Provider Care Teams.  These Care Teams include your primary Cardiologist (physician) and Advanced Practice Providers (APPs -  Physician Assistants and Nurse Practitioners) who all work together to provide you with the care you need, when you need it.  We recommend signing up for the patient portal called "MyChart".  Sign up information is provided on this After Visit Summary.  MyChart is used to connect with patients for Virtual Visits (Telemedicine).  Patients are able to view lab/test results, encounter notes, upcoming appointments, etc.  Non-urgent messages can be sent to your provider as well.   To learn more about what you can do with MyChart, go to [email or web address].    Your next appointment:   6 month(s)  The format for your next appointment:   In Person  Provider:   [name], MD   Other Instructions N/a

## 2021-02-09 ENCOUNTER — Other Ambulatory Visit: Payer: Self-pay

## 2021-02-09 ENCOUNTER — Ambulatory Visit (INDEPENDENT_AMBULATORY_CARE_PROVIDER_SITE_OTHER): Payer: Medicare HMO

## 2021-02-09 DIAGNOSIS — I428 Other cardiomyopathies: Secondary | ICD-10-CM

## 2021-02-09 LAB — CUP PACEART REMOTE DEVICE CHECK
Battery Remaining Longevity: 38 mo
Battery Voltage: 2.95 V
Brady Statistic RV Percent Paced: 0.67 %
Date Time Interrogation Session: 20221107061804
HighPow Impedance: 102 Ohm
Implantable Lead Implant Date: 20140813
Implantable Lead Location: 753860
Implantable Lead Model: 6935
Implantable Pulse Generator Implant Date: 20140813
Lead Channel Impedance Value: 323 Ohm
Lead Channel Impedance Value: 399 Ohm
Lead Channel Pacing Threshold Amplitude: 0.875 V
Lead Channel Pacing Threshold Pulse Width: 0.4 ms
Lead Channel Sensing Intrinsic Amplitude: 16.625 mV
Lead Channel Sensing Intrinsic Amplitude: 16.625 mV
Lead Channel Setting Pacing Amplitude: 2 V
Lead Channel Setting Pacing Pulse Width: 0.4 ms
Lead Channel Setting Sensing Sensitivity: 0.3 mV

## 2021-02-09 MED ORDER — TORSEMIDE 20 MG PO TABS: ORAL_TABLET | ORAL | 5 refills | Status: DC

## 2021-02-10 DIAGNOSIS — I493 Ventricular premature depolarization: Secondary | ICD-10-CM | POA: Diagnosis not present

## 2021-02-11 ENCOUNTER — Telehealth: Payer: Self-pay | Admitting: Internal Medicine

## 2021-02-11 NOTE — Telephone Encounter (Signed)
Refill was sent 02/09/2021 with refills.  torsemide (DEMADEX) 20 MG tablet 68 tablet 5 02/09/2021    Sig: Take 3 tablets (60 mg) once daily on Mondays Wednesdays & Fridays, and take 2 tablets (40 mg) once daily on all other days   Sent to pharmacy as: torsemide (DEMADEX) 20 MG tablet   E-Prescribing Status: Receipt confirmed by pharmacy (02/09/2021 10:32 AM EST)

## 2021-02-11 NOTE — Telephone Encounter (Signed)
*  STAT* If patient is at the pharmacy, call can be transferred to refill team.   1. Which medications need to be refilled? (please list name of each medication and dose if known) torsemide 20 MG 3 tablets once daily on Monday Wednesday and Friday and 2 tablets on all other days  2. Which pharmacy/location (including street and city if local pharmacy) is medication to be sent to? [place]  3. Do they need a 30 day or 90 day supply? 90 day

## 2021-02-13 NOTE — Progress Notes (Signed)
Remote ICD transmission.   

## 2021-02-18 DIAGNOSIS — I493 Ventricular premature depolarization: Secondary | ICD-10-CM | POA: Diagnosis not present

## 2021-02-19 ENCOUNTER — Other Ambulatory Visit: Payer: Self-pay | Admitting: Internal Medicine

## 2021-02-19 ENCOUNTER — Telehealth: Payer: Self-pay | Admitting: Internal Medicine

## 2021-02-19 NOTE — Telephone Encounter (Signed)
Patch Wear Time:  7 days and 2 hours (2022-11-01T12:39:56-0400 to 2022-11-08T14:27:09-498)   Patient had a min HR of 53 bpm, max HR of 222 bpm, and avg HR of 77 bpm. Predominant underlying rhythm was Sinus Rhythm. First Degree AV Block was present. Bundle Branch Block/IVCD was present. 820 Ventricular Tachycardia runs occurred, the run with the  fastest interval lasting 4 beats with a max rate of 222 bpm, the longest lasting 20 beats with an avg rate of 139 bpm. 381 Supraventricular Tachycardia runs occurred, the run with the fastest interval lasting 13 beats with a max rate of 184 bpm, the  longest lasting 8.0 secs with an avg rate of 120 bpm. Some episodes of Supraventricular Tachycardia may be possible Atrial Tachycardia with variable block. Isolated SVEs were frequent (5.0%, 34417), SVE Couplets were rare (<1.0%, 2447), and SVE Triplets  were rare (<1.0%, 1576). Isolated VEs were frequent (9.7%, [id number]), VE Couplets were occasional (1.5%, 5126), and VE Triplets were occasional (1.1%, 2546). Ventricular Bigeminy and Trigeminy were present. QRS morphology changes were present throughout  recording. Occasional change in QRS axis possibly due to positional changes.

## 2021-02-19 NOTE — Telephone Encounter (Signed)
This is a [place] pt 

## 2021-02-19 NOTE — Telephone Encounter (Signed)
I spoke with the patient. I advised her that her ZIO has resulted as of yesterday, but not reviewed by Dr. [name]. I have discussed preliminary findings with the patient.  I have offered her an appointment with Dr. [name] on 03/05/21 to discuss these results in person and formulate a plan of care if needed based on these findings.  The patient voices understanding and is agreeable.

## 2021-02-19 NOTE — Telephone Encounter (Signed)
Patient calling to check status of monitor results Please call

## 2021-03-02 ENCOUNTER — Ambulatory Visit (INDEPENDENT_AMBULATORY_CARE_PROVIDER_SITE_OTHER): Payer: Medicare HMO

## 2021-03-02 DIAGNOSIS — Z95810 Presence of automatic (implantable) cardiac defibrillator: Secondary | ICD-10-CM

## 2021-03-02 DIAGNOSIS — I5022 Chronic systolic (congestive) heart failure: Secondary | ICD-10-CM

## 2021-03-04 NOTE — Progress Notes (Signed)
EPIC Encounter for ICM Monitoring  Patient Name: Katherine Rosales is a 70 y.o. female Date: 03/04/2021 Primary Care Physican: Dion Body, MD Primary Cardiologist: [name] Electrophysiologist: [name] 01/27/2021 Weight: 206 lbs 03/04/2021 Weight: 204 lbs          Spoke with patient and heart failure questions reviewed.  Pt asymptomatic for fluid accumulation and feeling well.   Optivol thoracic impedance suggesting fluid levels returned to normal since 01/26/2021 and change in Torsemide dosage.   Prescribed: Torsemide 40 mg Take 3 tablets (60 mg) once daily on Mondays Wednesdays & Fridays, and take 2 tablets (40 mg) once daily on all other days   Labs: 07/07/2020 Creatinine 1.28, BUN 22, Potassium 3.8, Sodium 139, GFR 45 A complete set of results can be found in Results Review.   Recommendations:  No changes and encouraged to call if experiencing any fluid symptoms.   Follow-up plan: ICM clinic phone appointment on 04/07/2021.   91 day device clinic remote transmission 05/11/2021.    EP/Cardiology Office Visits: 03/05/2021 with Dr [name].   Copy of ICM check sent to Dr. [name].    3 month ICM trend: 03/02/2021.    12-14 Month ICM trend:       Katherine Billings, RN 03/04/2021 3:56 PM

## 2021-03-04 NOTE — Progress Notes (Signed)
Margarette Asal, female   DOB: [birth year], 70 y.o.   MRN: [id number] Patient Care Team: Dion Body, MD as PCP - General (Family Medicine) Deboraha Sprang, MD as PCP - Cardiology (Cardiology)   HPI  Katherine Rosales is a 70 y.o. female Seen in followup for Medtronic  ICD implanted for nonischemic cardiomyopathy and primary prevention. She has chronic systolic failure. Generator replacement in 8/14.  10/21 acute heart failure associated with atrial fibrillation with a rapid rate.  Temporally related to discontinuing her CPAP  Now anticoagulated with Eliquis without  bleeding  Seen 4/22 with increasing burden of nonsustained ventricular tachycardia.  Resumed her Aldactone 12.5 and transition her back from metoprolol--carvedilol.  In the interval started her on Entresto and she is considerably better.   Event recorder 11/22 personally reviewed  freq atrial ectopy (>5%)and nonsustained atrial tachycardia; freq (9.7%) PVCs monomorphic--No afib noted  Palpitations are discombulating, assoc with sob and LH and particularly problematic at night No edema, chest pain, some LH particularly with standing-- ongoing for about the last year, does not note any specific medication associations       She notes she doesn't use her Cpap at night   Date Cr K TSH LFTs Hgb  8/19 1.6 4.5   11.7  3/20 1/2  4.4   11.9  10/20 1.2 4.1   11.6  5/21 1.3 4.6   (2/21) 11.8   12/21 1.31 3.8   (10/21)12.5  4/22 1.28 3.8                DATE TEST EF%   6/14 Echo   20-25 %   9/20 Echo   30-35 %   5/22 Echo 20-25%       Past Medical History:  Diagnosis Date   6949 Lead-Medtronic    Anemia 11/12/2016   Anxiety 12/25/2013   Automatic implantable cardioverter-defibrillator in situ    Breast cancer ([place]) 03/1995   right breast ca with lumpectomy and chemo and rad   Cardiomyopathy, nonischemic ([place])    a. Prior cath w/ nonobs CAD; b. s/p AICD w/ subsequent upgrade 11/2012-> MDT [place] XT VR single lead  ICD (ser # [id number]); c. 12/2018 Echo: EF 30-35%; d. 08/2020 Echo: EF 20-25%, glob HK. Mod elev RVSP. Mod dil LA, mild to mod RAE. Mod MR/TR. Mild-mod AoV sclerosis.   Chronic HFrEF (heart failure with reduced ejection fraction) ([place])    a. 12/2018 Echo: EF 30-35%; b. 08/2020 Echo: EF 20-25%, glob HK.   CKD (chronic kidney disease), stage III ([place]) 11/12/2016   Depression    Diabetes mellitus    Type II Diabetes   Hypertension    Hypothyroidism    ICD (implantable cardiac defibrillator), singleMedtronic    NASH (nonalcoholic steatohepatitis)    OSA (obstructive sleep apnea)    a. On CPAP.   PAF (paroxysmal atrial fibrillation) ([place])    a. 09/2020 s/p DCCV.   Personal history of chemotherapy 1996   right breast ca   Personal history of radiation therapy 1996   right breast ca   PVC's (premature ventricular contractions)    a. 08/2020 Zio: 8.1% PVC burden.   Sleep apnea    On CPAP   Spleen enlarged    Systolic heart failure, chronic ([place])     Past Surgical History:  Procedure Laterality Date   ABDOMINAL HYSTERECTOMY     BREAST BIOPSY Right 11/26/2015   - FAT NECROSIS, FIBROSIS, AND CHRONIC INFLAMMATION.    BREAST BIOPSY Right  12/20/2018   Affirm Bx- X-clip- path pending   BREAST BIOPSY WITH RADIO FREQUENCY LOCALIZER Right 10/18/2019   Procedure: RF TAG GUIDED EXCISIONAL BREAST BIOPSY;  Surgeon: Benjamine Sprague, DO;  Location: [facility] ORS;  Service: General;  Laterality: Right;   BREAST EXCISIONAL BIOPSY Right 03/1995   + breast ca chemo and rad. 9:00 region   BREAST EXCISIONAL BIOPSY Right 2011   surgical bx neg  11:00 region   BREAST LUMPECTOMY Right 1996   CARDIOVERSION N/A 02/13/2020   Procedure: CARDIOVERSION;  Surgeon: Kate Sable, MD;  Location: [facility] ORS;  Service: Cardiovascular;  Laterality: N/A;   CARDIOVERSION N/A 09/29/2020   Procedure: CARDIOVERSION;  Surgeon: Wellington Hampshire, MD;  Location: [facility] ORS;  Service: Cardiovascular;  Laterality: N/A;   CHOLECYSTECTOMY      COLONOSCOPY  03/05/2013   COLONOSCOPY W/ POLYPECTOMY  03/05/2013   COLONOSCOPY WITH PROPOFOL N/A 10/03/2017   Procedure: COLONOSCOPY WITH PROPOFOL;  Surgeon: Manya Silvas, MD;  Location: [facility] ENDOSCOPY;  Service: Endoscopy;  Laterality: N/A;   ESOPHAGOGASTRODUODENOSCOPY  01/11/2003, 06/25/2015   ESOPHAGOGASTRODUODENOSCOPY (EGD) WITH PROPOFOL N/A 06/25/2015   Procedure: ESOPHAGOGASTRODUODENOSCOPY (EGD) WITH PROPOFOL;  Surgeon: Manya Silvas, MD;  Location: [facility] ENDOSCOPY;  Service: Endoscopy;  Laterality: N/A;   FOOT SURGERY Right    IMPLANTABLE CARDIOVERTER DEFIBRILLATOR (ICD) GENERATOR CHANGE N/A 11/15/2012   Procedure: ICD GENERATOR CHANGE;  Surgeon: Deboraha Sprang, MD;  Location: [facility] CATH LAB;  Service: Cardiovascular;  Laterality: N/A;   JOINT REPLACEMENT  2009   Total Knee Replacement, Left   LEAD REVISION N/A 11/15/2012   Procedure: LEAD REVISION;  Surgeon: Deboraha Sprang, MD;  Location: [facility] CATH LAB;  Service: Cardiovascular;  Laterality: N/A;   PACEMAKER INSERTION  06/30/2005   Medtronic Virtuoso VR ICD - Nonischemic cardiomyopathy potentially related to adriamycin, class 3 congestive failture with a narrow QRS and a negative TDI   TONSILLECTOMY     TUBAL LIGATION      Current Outpatient Medications  Medication Sig Dispense Refill   alendronate (FOSAMAX) 70 MG tablet Take 70 mg by mouth every Thursday.     apixaban (ELIQUIS) 5 MG TABS tablet TAKE 1 TABLET BY MOUTH TWICE DAILY 60 tablet 5   Ascorbic Acid (VITAMIN C) 1000 MG tablet Take 1,000 mg by mouth daily.     Calcium Carb-Cholecalciferol (CALCIUM 600 + D PO) Take 1 tablet by mouth 2 (two) times daily.     carvedilol (COREG) 3.125 MG tablet TAKE 1 TABLET BY MOUTH TWICE DAILY 60 tablet 0   CINNAMON PO Take 1,000 mg by mouth daily.     docusate sodium (COLACE) 100 MG capsule Take 300 mg by mouth at bedtime.      DULoxetine (CYMBALTA) 30 MG capsule Take 30 mg by mouth daily.     empagliflozin (JARDIANCE) 25 MG TABS tablet Take  1 tablet by mouth daily.     gabapentin (NEURONTIN) 600 MG tablet Take 600 mg by mouth 2 (two) times daily.     glimepiride (AMARYL) 4 MG tablet Take 4 mg by mouth 2 (two) times daily before lunch and supper.      levothyroxine (SYNTHROID, LEVOTHROID) 100 MCG tablet Take 100 mcg by mouth daily.      Multiple Vitamin (MULTIVITAMIN WITH MINERALS) TABS tablet Take 1 tablet by mouth daily. Centrum Silver     Multiple Vitamins-Minerals (OCUVITE PO) Take 1 tablet by mouth daily.     Multiple Vitamins-Minerals (ZINC PO) Take 1 tablet by mouth 2 (two) times  a week.     omeprazole (PRILOSEC) 20 MG capsule Take 20 mg by mouth daily.       sacubitril-valsartan (ENTRESTO) 24-26 MG Take 1 tablet by mouth 2 (two) times daily. 180 tablet 0   spironolactone (ALDACTONE) 25 MG tablet TAKE (1/2) TABLET BY MOUTH ONCE DAILY. 45 tablet 0   torsemide (DEMADEX) 20 MG tablet Take 3 tablets (60 mg) once daily on Mondays Wednesdays & Fridays, and take 2 tablets (40 mg) once daily on all other days 68 tablet 5   ELDERBERRY PO Take 1 tablet by mouth 2 (two) times a week. (Patient not taking: Reported on 03/05/2021)     Semaglutide,0.25 or 0.5MG/DOS, (OZEMPIC, 0.25 OR 0.5 MG/DOSE,) 2 MG/1.5ML SOPN Inject 0.5 mg into the skin every Friday.  (Patient not taking: Reported on 03/05/2021)     No current facility-administered medications for this visit.   Allergies  Allergen Reactions   Ibuprofen Other (See Comments)    Avoid due to stage 3 kidney disease     Review of Systems negative except from HPI and PMH  Physical Exam: BP 114/64 (BP Location: Left Arm, Patient Position: Sitting, Cuff Size: Large)   Pulse 88   Ht 4' 11"  (1.499 m)   Wt 207 lb (93.9 kg)   SpO2 97%   BMI 41.81 kg/m  Well developed and Morbidly obese in no acute distress HENT normal Neck supple with JVP-flat Clear Device pocket well healed; without hematoma or erythema.  There is no tethering  Regular rate and rhythm, no murmur Abd-soft with active  BS No Clubbing cyanosis   edema Skin-warm and dry A & Oriented  Grossly normal sensory and motor function  ECG none     Assessment and  Plan  Nonischemic cardiomyopathy  Heart failure chronic systolic  Hypertension   Orthostatic LH  PVCs High burden RBBB superior axis  Implantable defibrillator-Medtronic        Sleep apnea  -- compliant  Atrial fibrillation-persistent holding sinus rhythm//Freq atrial ectopy and atrial tachycardia   VT nonsustained by device detection but suspect atrial tach by event recorder (As above)    No interval afib, continue Apixoban  5 bid  LH is concerning, suspect orthostatic hypotension- may be aggravated by amio (See Below) and may require discontinuing of the entresto in favor of ACE/ARB  Given burden of ventricular and atrial ectopy, have discussed SE and have decided to proceed with amiodarone, 400 bid x 2 w; 400 daily x 2 w, then 200 daily Will reassess ectopy in about 12 weeks  Check amio labs today   Encouraged weight loss

## 2021-03-05 ENCOUNTER — Encounter: Payer: Self-pay | Admitting: Internal Medicine

## 2021-03-05 ENCOUNTER — Ambulatory Visit (INDEPENDENT_AMBULATORY_CARE_PROVIDER_SITE_OTHER): Payer: Medicare HMO | Admitting: Internal Medicine

## 2021-03-05 ENCOUNTER — Other Ambulatory Visit: Payer: Self-pay

## 2021-03-05 VITALS — BP 114/64 | HR 88 | Ht 59.0 in | Wt 207.0 lb

## 2021-03-05 DIAGNOSIS — Z95810 Presence of automatic (implantable) cardiac defibrillator: Secondary | ICD-10-CM

## 2021-03-05 DIAGNOSIS — I4819 Other persistent atrial fibrillation: Secondary | ICD-10-CM

## 2021-03-05 DIAGNOSIS — I493 Ventricular premature depolarization: Secondary | ICD-10-CM

## 2021-03-05 DIAGNOSIS — I10 Essential (primary) hypertension: Secondary | ICD-10-CM

## 2021-03-05 DIAGNOSIS — I5022 Chronic systolic (congestive) heart failure: Secondary | ICD-10-CM | POA: Diagnosis not present

## 2021-03-05 DIAGNOSIS — Z79899 Other long term (current) drug therapy: Secondary | ICD-10-CM

## 2021-03-05 DIAGNOSIS — I428 Other cardiomyopathies: Secondary | ICD-10-CM

## 2021-03-05 MED ORDER — AMIODARONE HCL 200 MG PO TABS: ORAL_TABLET | ORAL | 0 refills | Status: DC

## 2021-03-05 NOTE — Patient Instructions (Signed)
Medication Instructions:  - Your physician has recommended you make the following change in your medication:   1) START amiodarone 200 mg: - take 2 tablets (400 mg) by mouth TWICE daily x 2 weeks, then - take 2 tablets (400 mg) by mouth ONCE daily x 2 weeks, then - take 1 tablet (200 mg) by mouth ONCE daily   *If you need a refill on your cardiac medications before your next appointment, please call your pharmacy*   Lab Work: - Your physician recommends that you have lab work today: TSH/ Liver  If you have labs (blood work) drawn today and your tests are completely normal, you will receive your results only by: [employer] (if you have [place]) OR A paper copy in the mail If you have any lab test that is abnormal or we need to change your treatment, we will call you to review the results.   Testing/Procedures:  1) Heart Monitor:  To be placed: in 10 weeks (this will be mailed to your home address) Wear Time: 2 weeks  - Your physician has recommended that you wear a Zio XT (heart) monitor.   This monitor is a medical device that records the heart's electrical activity. Doctors most often use these monitors to diagnose arrhythmias. Arrhythmias are problems with the speed or rhythm of the heartbeat. The monitor is a small device applied to your chest. You can wear one while you do your normal daily activities. While wearing this monitor if you have any symptoms to push the button and record what you felt. Once you have worn this monitor for the period of time provider prescribed (Usually 14 days), you will return the monitor device in the postage paid box. Once it is returned they will download the data collected and provide [place] with a report which the provider will then review and we will call you with those results. Important tips:  Avoid showering during the first 24 hours of wearing the monitor. Avoid excessive sweating to help maximize wear time. Do not submerge the device, no  hot tubs, and no swimming pools. Keep any lotions or oils away from the patch. After 24 hours you may shower with the patch on. Take brief showers with your back facing the shower head.  Do not remove patch once it has been placed because that will interrupt data and decrease adhesive wear time. Push the button when you have any symptoms and write down what you were feeling. Once you have completed wearing your monitor, remove and place into box which has postage paid and place in your outgoing mailbox.  If for some reason you have misplaced your box then call our office and we can provide another box and/or mail it off for you.   Follow-Up: At [facility], you and your health needs are our priority.  As part of our continuing mission to provide you with exceptional heart care, we have created designated Provider Care Teams.  These Care Teams include your primary Cardiologist (physician) and Advanced Practice Providers (APPs -  Physician Assistants and Nurse Practitioners) who all work together to provide you with the care you need, when you need it.  We recommend signing up for the patient portal called "MyChart".  Sign up information is provided on this After Visit Summary.  MyChart is used to connect with patients for Virtual Visits (Telemedicine).  Patients are able to view lab/test results, encounter notes, upcoming appointments, etc.  Non-urgent messages can be sent to your provider  as well.   To learn more about what you can do with MyChart, go to [email or web address].    Your next appointment:   14 week(s)  The format for your next appointment:   In Person  Provider:   [name], MD    Other Instructions  Amiodarone Tablets What is this medication? AMIODARONE (a MEE oh da rone) prevents and treats a fast or irregular heartbeat (arrhythmia). It works by slowing down overactive electric signals in the heart, which stabilizes your heart rhythm. It belongs to a group of  medications called antiarrhythmics. This medicine may be used for other purposes; ask your health care provider or pharmacist if you have questions. COMMON BRAND NAME(S): Cordarone, Pacerone What should I tell my care team before I take this medication? They need to know if you have any of these conditions: Liver disease Lung disease Other heart problems Thyroid disease An unusual or allergic reaction to amiodarone, iodine, other medications, foods, dyes, or preservatives Pregnant or trying to get pregnant Breast-feeding How should I use this medication? Take this medication by mouth with a glass of water. Follow the directions on the prescription label. You can take this medication with or without food. However, you should always take it the same way each time. Take your doses at regular intervals. Do not take your medication more often than directed. Do not stop taking except on the advice of your care team. A special MedGuide will be given to you by the pharmacist with each prescription and refill. Be sure to read this information carefully each time. Talk to your care team regarding the use of this medication in children. Special care may be needed. Overdosage: If you think you have taken too much of this medicine contact a poison control center or emergency room at once. NOTE: This medicine is only for you. Do not share this medicine with others. What if I miss a dose? If you miss a dose, take it as soon as you can. If it is almost time for your next dose, take only that dose. Do not take double or extra doses. What may interact with this medication? Do not take this medication with any of the following: Abarelix Apomorphine Arsenic trioxide Certain antibiotics like erythromycin, gemifloxacin, levofloxacin, pentamidine Certain medications for depression like amoxapine, tricyclic antidepressants Certain medications for fungal infections like fluconazole, itraconazole, ketoconazole,  posaconazole, voriconazole Certain medications for irregular heartbeat like disopyramide, dronedarone, ibutilide, propafenone, sotalol Certain medications for malaria like chloroquine, halofantrine Cisapride Droperidol Haloperidol [place] Maprotiline Methadone Phenothiazines like chlorpromazine, mesoridazine, thioridazine Pimozide Ranolazine Red yeast rice Vardenafil This medication may also interact with the following: Antiviral medications for HIV or AIDS Certain medications for blood pressure, heart disease, irregular heart beat Certain medications for cholesterol like atorvastatin, cerivastatin, lovastatin, simvastatin Certain medications for hepatitis C like sofosbuvir and ledipasvir; sofosbuvir Certain medications for seizures like phenytoin Certain medications for thyroid problems Certain medications that treat or prevent blood clots like warfarin Cholestyramine Cimetidine Clopidogrel Cyclosporine Dextromethorphan Diuretics Dofetilide Fentanyl General anesthetics Grapefruit juice Lidocaine Loratadine Methotrexate Other medications that prolong the QT interval (cause an abnormal heart rhythm) Procainamide Quinidine Rifabutin, rifampin, or rifapentine St. John's Wort Trazodone Ziprasidone This list may not describe all possible interactions. Give your health care provider a list of all the medicines, herbs, non-prescription drugs, or dietary supplements you use. Also tell them if you smoke, drink alcohol, or use illegal drugs. Some items may interact with your medicine. What should I watch for while using this  medication? Your condition will be monitored closely when you first begin therapy. Often, this medication is first started in a hospital or other monitored health care setting. Once you are on maintenance therapy, visit your care team for regular checks on your progress. Because your condition and use of this medication carry some risk, it is a good idea to  carry an identification card, necklace or bracelet with details of your condition, medications, and care team. You may get drowsy or dizzy. Do not drive, use machinery, or do anything that needs mental alertness until you know how this medication affects you. Do not stand or sit up quickly, especially if you are an older patient. This reduces the risk of dizzy or fainting spells. This medication can make you more sensitive to the sun. Keep out of the sun. If you cannot avoid being in the sun, wear protective clothing and use sunscreen. Do not use sun lamps or tanning beds/booths. You should have regular eye exams before and during treatment. Call your care team if you have blurred vision, see halos, or your eyes become sensitive to light. Your eyes may get dry. It may be helpful to use a lubricating eye solution or artificial tears solution. If you are going to have surgery or a procedure that requires contrast dyes, tell your care team that you are taking this medication. What side effects may I notice from receiving this medication? Side effects that you should report to your care team as soon as possible: Allergic reactions--skin rash, itching, hives, swelling of the face, lips, tongue, or throat Bluish-gray skin Change in vision such as blurry vision, seeing halos around lights, vision loss Heart failure--shortness of breath, swelling of the ankles, feet, or hands, sudden weight gain, unusual weakness or fatigue Heart rhythm changes--fast or irregular heartbeat, dizziness, feeling faint or lightheaded, chest pain, trouble breathing High thyroid levels (hyperthyroidism)--fast or irregular heartbeat, weight loss, excessive sweating or sensitivity to heat, tremors or shaking, anxiety, nervousness, irregular menstrual cycle or spotting Liver injury--right upper belly pain, loss of appetite, nausea, light-colored stool, dark yellow or brown urine, yellowing skin or eyes, unusual weakness or fatigue Low  thyroid levels (hypothyroidism)--unusual weakness or fatigue, sensitivity to cold, constipation, hair loss, dry skin, weight gain, feelings of depression Lung injury--shortness of breath or trouble breathing, cough, spitting up blood, chest pain, fever Pain, tingling, or numbness in the hands or feet, muscle weakness, trouble walking, loss of balance or coordination Side effects that usually do not require medical attention (report to your care team if they continue or are bothersome): Nausea Vomiting This list may not describe all possible side effects. Call your doctor for medical advice about side effects. You may report side effects to FDA at 1-800-FDA-1088. Where should I keep my medication? Keep out of the reach of children and pets. Store at room temperature between 20 and 25 degrees C (68 and 77 degrees F). Protect from light. Keep container tightly closed. Throw away any unused medication after the expiration date. NOTE: This sheet is a summary. It may not cover all possible information. If you have questions about this medicine, talk to your doctor, pharmacist, or health care provider.  2022 Elsevier/Gold Standard (2020-05-16 00:00:00)

## 2021-03-06 LAB — HEPATIC FUNCTION PANEL
ALT: 13 IU/L (ref 0–32)
AST: 19 IU/L (ref 0–40)
Albumin: 4.5 g/dL (ref 3.7–4.7)
Alkaline Phosphatase: 77 IU/L (ref 44–121)
Bilirubin Total: 0.6 mg/dL (ref 0.0–1.2)
Bilirubin, Direct: 0.24 mg/dL (ref 0.00–0.40)
Total Protein: 7.1 g/dL (ref 6.0–8.5)

## 2021-03-06 LAB — TSH: TSH: 1.82 u[IU]/mL (ref 0.450–4.500)

## 2021-03-12 LAB — CUP PACEART INCLINIC DEVICE CHECK
Battery Remaining Longevity: 44 mo
Battery Voltage: 2.94 V
Brady Statistic RV Percent Paced: 1.16 %
Date Time Interrogation Session: 20221101123100
HighPow Impedance: 91 Ohm
Implantable Lead Implant Date: 20140813
Implantable Lead Location: 753860
Implantable Lead Model: 6935
Implantable Pulse Generator Implant Date: 20140813
Lead Channel Impedance Value: 323 Ohm
Lead Channel Impedance Value: 399 Ohm
Lead Channel Pacing Threshold Amplitude: 0.75 V
Lead Channel Pacing Threshold Pulse Width: 0.4 ms
Lead Channel Sensing Intrinsic Amplitude: 15.25 mV
Lead Channel Sensing Intrinsic Amplitude: 15.25 mV
Lead Channel Setting Pacing Amplitude: 2 V
Lead Channel Setting Pacing Pulse Width: 0.4 ms
Lead Channel Setting Sensing Sensitivity: 0.3 mV

## 2021-03-18 ENCOUNTER — Telehealth: Payer: Self-pay | Admitting: Internal Medicine

## 2021-03-18 NOTE — Telephone Encounter (Signed)
Patient was told to contact if she hasnt received income verification papers by today and she has not.

## 2021-03-18 NOTE — Telephone Encounter (Signed)
Patient dropped off PAF put in box

## 2021-03-18 NOTE — Telephone Encounter (Signed)
I spoke with the patient. She called to inquire if I was able to look and see if I still had her tax documentation on file from her last patient assistance application for entresto from this year. She has her new application for next year to complete and have [place] submit.  I advised the patient I would look for this and call her back.  I was able to locate the patient's 2021 Social Security Benefit statement.  I called the patient back and notified her of this. I have asked her to bring me the completed portion of her assistance application by tomorrow at lunch time so I can have Dr. [name] sign this for her and I can submit the form tomorrow.  The patient voices understanding and is agreeable.

## 2021-03-19 ENCOUNTER — Other Ambulatory Visit: Payer: Self-pay | Admitting: *Deleted

## 2021-03-19 MED ORDER — ENTRESTO 24-26 MG PO TABS: 1.0000 | ORAL_TABLET | Freq: Two times a day (BID) | ORAL | 3 refills | Status: DC

## 2021-03-19 NOTE — Telephone Encounter (Signed)
Forms received.   Awaiting MD signature for the application/ RX.

## 2021-03-19 NOTE — Telephone Encounter (Signed)
Completed patient assistance application for Entresto faxed to [employer] at [phone]. Confirmation received.   I attempted to call the patient to make her aware this was done. No answer- I left a detailed message of the above and asked her to call back with any further questions/ concerns.  Application placed in file cabinet for assistance applications.

## 2021-03-19 NOTE — Telephone Encounter (Signed)
Ham, Pilar A at 03/18/2021  3:28 PM  Status: Signed  Patient dropped off PAF put in box

## 2021-03-19 NOTE — Telephone Encounter (Signed)
An encounter was already open for this same issue- see 03/18/21 phone note. Will close this encounter.

## 2021-03-21 ENCOUNTER — Other Ambulatory Visit: Payer: Self-pay | Admitting: Internal Medicine

## 2021-03-23 DIAGNOSIS — D693 Immune thrombocytopenic purpura: Secondary | ICD-10-CM | POA: Diagnosis not present

## 2021-03-23 DIAGNOSIS — E039 Hypothyroidism, unspecified: Secondary | ICD-10-CM | POA: Diagnosis not present

## 2021-03-23 DIAGNOSIS — N1832 Chronic kidney disease, stage 3b: Secondary | ICD-10-CM | POA: Diagnosis not present

## 2021-03-23 NOTE — Telephone Encounter (Signed)
This is a [place] pt 

## 2021-03-31 DIAGNOSIS — D693 Immune thrombocytopenic purpura: Secondary | ICD-10-CM | POA: Diagnosis not present

## 2021-03-31 DIAGNOSIS — E1143 Type 2 diabetes mellitus with diabetic autonomic (poly)neuropathy: Secondary | ICD-10-CM | POA: Diagnosis not present

## 2021-03-31 DIAGNOSIS — Z6841 Body Mass Index (BMI) 40.0 and over, adult: Secondary | ICD-10-CM | POA: Diagnosis not present

## 2021-03-31 DIAGNOSIS — Z1389 Encounter for screening for other disorder: Secondary | ICD-10-CM | POA: Diagnosis not present

## 2021-03-31 DIAGNOSIS — Z0000 Encounter for general adult medical examination without abnormal findings: Secondary | ICD-10-CM | POA: Diagnosis not present

## 2021-03-31 DIAGNOSIS — N184 Chronic kidney disease, stage 4 (severe): Secondary | ICD-10-CM | POA: Diagnosis not present

## 2021-03-31 DIAGNOSIS — N1832 Chronic kidney disease, stage 3b: Secondary | ICD-10-CM | POA: Diagnosis not present

## 2021-03-31 DIAGNOSIS — E039 Hypothyroidism, unspecified: Secondary | ICD-10-CM | POA: Diagnosis not present

## 2021-04-01 ENCOUNTER — Telehealth: Payer: Self-pay | Admitting: Internal Medicine

## 2021-04-01 MED ORDER — AMIODARONE HCL 200 MG PO TABS: 200.0000 mg | ORAL_TABLET | Freq: Every day | ORAL | 3 refills | Status: DC

## 2021-04-01 NOTE — Telephone Encounter (Signed)
°*  STAT* If patient is at the pharmacy, call can be transferred to refill team.   1. Which medications need to be refilled? (please list name of each medication and dose if known) amiodarone 200 MG Take 2 tablets (400 mg) by mouth TWICE daily x 2 weeks, then take 2 tablets (400 mg) by mouth ONCE daily x 2 weeks  2. Which pharmacy/location (including street and city if local pharmacy) is medication to be sent to? [place]   3. Do they need a 30 day or 90 day supply? States that Nira Conn was waiting til the last week to call the last week in ?  Please review

## 2021-04-01 NOTE — Telephone Encounter (Signed)
Was able to return call to Katherine Rosales to clarify on medication amiodarone dosing. Pt reports she believes she has been taken differently then what Dr. [name] prescribe. Stated she was taking how it was written on her prescription bottle and stated her granddaughter had fixed her medicine for her, they did not refer to AVS printed out at last [place].    Katherine Rosales DoB: 03-Feb-1951 03/05/2021 11:00 AM [facility] [phone] Katherine Mccoy. [place] (MRN: [id number])  Printed at 03/05/2021 11:57 AM  [place] AFTER VISIT SUMMARY  Medication Instructions: - Your physician has recommended you make the following change in your medication: 1) START amiodarone 200 mg: - take 2 tablets (400 mg) by mouth TWICE daily x 2 weeks, then - take 2 tablets (400 mg) by mouth ONCE daily x 2 weeks, then - take 1 tablet (200 mg) by mouth ONCE daily   Pt reports taking amiodarone one tab (200 mg) BID since 03/05/2021. Has not taken taper dosing. As it has been almost 4 weeks since starting medication, advised will send in refill for amiodarone 200 mg daily. Mrs. Hunke verbalized understanding and thankful for calling and explaining medication. Advised to monitor her HR, for any concerning elevation in HR>100 then please call office for advice.   Will r/t primary RN Nira Conn as Juluis Rainier

## 2021-04-07 ENCOUNTER — Ambulatory Visit (INDEPENDENT_AMBULATORY_CARE_PROVIDER_SITE_OTHER): Payer: Medicare HMO

## 2021-04-07 DIAGNOSIS — I5022 Chronic systolic (congestive) heart failure: Secondary | ICD-10-CM

## 2021-04-07 DIAGNOSIS — Z95810 Presence of automatic (implantable) cardiac defibrillator: Secondary | ICD-10-CM | POA: Diagnosis not present

## 2021-04-07 NOTE — Telephone Encounter (Signed)
Noted- to Dr. [name] to review.

## 2021-04-07 NOTE — Telephone Encounter (Signed)
I called and spoke with the patient to follow up in regards to [facility], RN's conversation with her last week.  Per the patient, her niece had been fixing her medications and she had only been taking her amiodarone 200 mg BID since her appointment with Dr. [name] (instead of 400 mg BID x 2 weeks, then 400 mg QD x 2 weeks, then 200 mg QD).   The patient advised that her niece left out some of her medications for several days, including her coreg & entresto. This has been corrected.  She is currently taking amiodarone 200 mg QD.  Per the patient, she saw Dr. Netty Starring on 03/31/21 and he advised her that her heart was out of rhythm. She did advise that she feels some better with her rhythm, but she is having trouble sleeping and wonders if this is from the amiodarone. I advised with all the potential side effects from amiodarone, this could be a possibility.   I have advised her that we could have her transmit to see what her rhythm/ Optivol look like, but she advised she was due for an ICM transmission this week. Per her appointments, this was due today. She is aware I will forward a message to the [facility] to see if they can give me an idea what her a-fib is doing.   She also advised that Dr. Netty Starring said her kidneys were now at a stage IV and the best she could do for this was drink water. She advised she stays thirsty and is drinking water.   I have advised the patient I will follow up with [facility] and may need to review with Dr. [name], but will call her back as soon as possible with MD recommendations for her amiodarone.  Dr. [name] had wanted her to be the amiodarone ~ 10 weeks and wear a 2 week heart monitor at that time (this would be ~ 2/9).  The patient voices understanding of the above and was agreeable.  She was very appreciative of the call back.

## 2021-04-07 NOTE — Telephone Encounter (Signed)
Benedetto Goad, RN  Sent: Tue April 07, 2021  3:37 PM  To: Emily Filbert, RN          Message  Fabio Pierce,    I have reviewed the transmission. She has a single RV lead so no way to determine AF burden. I can say her presenting rhythm is irregular, rate is controlled with occ PVCs.     Hope this helps,  [employer]

## 2021-04-08 NOTE — Progress Notes (Signed)
EPIC Encounter for ICM Monitoring  Patient Name: Katherine Rosales is a 71 y.o. female Date: 04/08/2021 Primary Care Physican: Dion Body, MD Primary Cardiologist: [name] Electrophysiologist: [name] 01/27/2021 Weight: 206 lbs 03/04/2021 Weight: 204 lbs 04/08/2021 Weight: 211 lbs          Spoke with patient and heart failure questions reviewed.  Pt reports weight gain of 5-6 lbs and was very SOB last week due to her niece did place the correct Torsemide dosage in pill box last week.  She started correct dosage on Monday, 1/2 which correlates with impedance starting to improve. SOB has improved but weight is still up.  Pt reports PCP told her last week she is in Afib.  Pt has a single RV lead so unable to determine AF burden.   Optivol thoracic impedance suggesting possible fluid accumulation starting 03/29/21 but starting to trend toward baseline.   Prescribed: Torsemide 40 mg Take 3 tablets (60 mg) once daily on Mondays Wednesdays & Fridays, and take 2 tablets (40 mg) once daily on all other days   Labs: 07/07/2020 Creatinine 1.28, BUN 22, Potassium 3.8, Sodium 139, GFR 45 A complete set of results can be found in Results Review.   Recommendations:  Pt is taking correct dosage of Torsemide which should decrease fluid level accumulation.  Encouraged to call if fluid symptoms worsen.    Follow-up plan: ICM clinic phone appointment on 04/17/2021 to recheck fluid levels.   91 day device clinic remote transmission 05/11/2021.    EP/Cardiology Office Visits: 06/09/2021 with Dr [name].   Copy of ICM check sent to Dr. [name].    3 month ICM trend: 04/07/2021.    12-14 Month ICM trend:     [name], RN 04/08/2021 4:07 PM

## 2021-04-09 DIAGNOSIS — E211 Secondary hyperparathyroidism, not elsewhere classified: Secondary | ICD-10-CM | POA: Diagnosis not present

## 2021-04-09 DIAGNOSIS — N179 Acute kidney failure, unspecified: Secondary | ICD-10-CM | POA: Insufficient documentation

## 2021-04-09 DIAGNOSIS — R809 Proteinuria, unspecified: Secondary | ICD-10-CM | POA: Diagnosis not present

## 2021-04-09 DIAGNOSIS — N2581 Secondary hyperparathyroidism of renal origin: Secondary | ICD-10-CM | POA: Diagnosis not present

## 2021-04-09 DIAGNOSIS — R8290 Unspecified abnormal findings in urine: Secondary | ICD-10-CM | POA: Diagnosis not present

## 2021-04-09 DIAGNOSIS — N1832 Chronic kidney disease, stage 3b: Secondary | ICD-10-CM | POA: Diagnosis not present

## 2021-04-09 DIAGNOSIS — N041 Nephrotic syndrome with focal and segmental glomerular lesions: Secondary | ICD-10-CM | POA: Diagnosis not present

## 2021-04-09 DIAGNOSIS — E1122 Type 2 diabetes mellitus with diabetic chronic kidney disease: Secondary | ICD-10-CM | POA: Diagnosis not present

## 2021-04-09 DIAGNOSIS — I129 Hypertensive chronic kidney disease with stage 1 through stage 4 chronic kidney disease, or unspecified chronic kidney disease: Secondary | ICD-10-CM | POA: Diagnosis not present

## 2021-04-09 NOTE — Telephone Encounter (Signed)
Heather The amio was for the v ectopy and less for Afib  We will get the monitor as scheduled  Thanks SK

## 2021-04-09 NOTE — Telephone Encounter (Signed)
I spoke with the patient. I have advised her of Dr. Olin Pia recommendations as stated below. Confirmed with Dr. [name] that the patient will continue amiodarone 200 mg once daily. We will have her wear a ZIO monitor at the beginning of February and follow up with her after that.  The patient voices understanding and is agreeable.

## 2021-04-14 ENCOUNTER — Telehealth: Payer: Self-pay | Admitting: Internal Medicine

## 2021-04-14 NOTE — Telephone Encounter (Signed)
Fax notification received from [employer] Patient Assistance for Katherine Rosales stating that the patient has been approved for assistance until 04/04/2022.  I have called and notified the patient. She voices understanding of the above and was appreciative of the call back.

## 2021-04-17 ENCOUNTER — Ambulatory Visit (INDEPENDENT_AMBULATORY_CARE_PROVIDER_SITE_OTHER): Payer: Medicare HMO

## 2021-04-17 DIAGNOSIS — I5022 Chronic systolic (congestive) heart failure: Secondary | ICD-10-CM

## 2021-04-17 DIAGNOSIS — Z95810 Presence of automatic (implantable) cardiac defibrillator: Secondary | ICD-10-CM

## 2021-04-17 NOTE — Progress Notes (Signed)
EPIC Encounter for ICM Monitoring  Patient Name: Katherine Rosales is a 71 y.o. female Date: 04/17/2021 Primary Care Physican: Dion Body, MD Primary Cardiologist: [name] Electrophysiologist: [name] 01/27/2021 Weight: 206 lbs 03/04/2021 Weight: 204 lbs 04/08/2021 Weight: 211 lbs 04/17/2021 Weight: 208 lbs          Spoke with patient and heart failure questions reviewed.  Pt reports she is chronically SOB but no worse than usual, no swelling and weight loss of 3 lbs.     Optivol thoracic impedance suggesting possible fluid accumulation starting 03/29/21 but starting to trend toward baseline.   Prescribed: Torsemide 40 mg Take 3 tablets (60 mg) once daily on Mondays Wednesdays & Fridays, and take 2 tablets (40 mg) once daily on all other days   Labs: 07/07/2020 Creatinine 1.28, BUN 22, Potassium 3.8, Sodium 139, GFR 45 A complete set of results can be found in Results Review.   Recommendations:  Recommendation to limit salt intake to 2000 mg daily and fluid intake to 64 oz daily.  Encouraged to call if experiencing any fluid symptoms.     Follow-up plan: ICM clinic phone appointment on 04/27/2021 to recheck fluid levels.   91 day device clinic remote transmission 05/11/2021.    EP/Cardiology Office Visits: 06/09/2021 with Dr [name].   Copy of ICM check sent to Dr. [name].    3 month ICM trend: 04/17/2021.    12-14 Month ICM trend:     [name], RN 04/17/2021 12:28 PM

## 2021-04-27 ENCOUNTER — Other Ambulatory Visit: Payer: Self-pay | Admitting: Internal Medicine

## 2021-04-27 DIAGNOSIS — I4819 Other persistent atrial fibrillation: Secondary | ICD-10-CM

## 2021-04-27 NOTE — Telephone Encounter (Signed)
Eliquis 33m refill request received. Patient is 71years old, weight-93.9kg, Crea-1.28 on 07/07/2020, Diagnosis-Afib, and last seen by Dr. KCaryl Comeson 03/05/2021. Dose is appropriate based on dosing criteria. Will send in refill to requested pharmacy.

## 2021-04-28 ENCOUNTER — Ambulatory Visit (INDEPENDENT_AMBULATORY_CARE_PROVIDER_SITE_OTHER): Payer: Medicare HMO

## 2021-04-28 DIAGNOSIS — I5022 Chronic systolic (congestive) heart failure: Secondary | ICD-10-CM

## 2021-04-28 DIAGNOSIS — Z95810 Presence of automatic (implantable) cardiac defibrillator: Secondary | ICD-10-CM

## 2021-04-28 NOTE — Progress Notes (Signed)
EPIC Encounter for ICM Monitoring  Patient Name: Katherine Rosales is a 71 y.o. female Date: 04/28/2021 Primary Care Physican: Dion Body, MD Primary Cardiologist: [name] Electrophysiologist: [name] 04/08/2021 Weight: 211 lbs 04/17/2021 Weight: 208 lbs 04/28/2021 Weight: 208 lbs          Spoke with patient and heart failure questions reviewed.  Pt reports breathing and weight are at baseline and no lower extremity swelling.      Optivol thoracic impedance suggesting possible fluid accumulation starting 03/29/21 with 1 day at baseline on 04/18/21.   Prescribed:  Pt reports 1/24 that all medications are blistered packed Torsemide 20 mg Take 3 tablets (60 mg) once daily on Mondays Wednesdays & Fridays, and take 2 tablets (40 mg) once daily on all other days Spironolactone 25 mg take 0.5 tablet (12.5 mg total) daily.  Labs: 07/07/2020 Creatinine 1.28, BUN 22, Potassium 3.8, Sodium 139, GFR 45 A complete set of results can be found in Results Review.   Recommendations:  Copy sent to Dr [name] for review and recommendations.      Follow-up plan: ICM clinic phone appointment on 05/12/2021 for 31 day and to recheck fluid levels.   91 day device clinic remote transmission 05/11/2021.    EP/Cardiology Office Visits: 06/09/2021 with Dr [name].   Copy of ICM check sent to Dr. [name].   3 month ICM trend: 04/27/2021.    12-14 Month ICM trend:     [name], RN 04/28/2021 10:36 AM

## 2021-04-29 NOTE — Progress Notes (Signed)
Spoke with patient. Per secure chat Dr [name] recommended to monitor closely and if she develops fluid symptoms to call the office.  Recommendation to limit salt intake to 2000 mg daily and fluid intake to 64 oz daily.  Encouraged to call if experiencing any fluid symptoms.  Will recheck fluid levels 05/12/2021 and to call if she develops any symptoms.

## 2021-04-30 ENCOUNTER — Telehealth: Payer: Self-pay | Admitting: Internal Medicine

## 2021-04-30 ENCOUNTER — Other Ambulatory Visit: Payer: Self-pay

## 2021-04-30 MED ORDER — TORSEMIDE 20 MG PO TABS: ORAL_TABLET | ORAL | 5 refills | Status: DC

## 2021-04-30 NOTE — Telephone Encounter (Signed)
torsemide (DEMADEX) 20 MG tablet 68 tablet 5 04/30/2021    Sig: Take 3 tablets (60 mg) once daily on Mondays Wednesdays & Fridays, and take 2 tablets (40 mg) once daily on all other days    [place], [place]

## 2021-04-30 NOTE — Telephone Encounter (Signed)
°*  STAT* If patient is at the pharmacy, call can be transferred to refill team.   1. Which medications need to be refilled? (please list name of each medication and dose if known) torsemide 20 MG 3 tablets once dauly on MWF 2 tablets all other days   2. Which pharmacy/location (including street and city if local pharmacy) is medication to be sent to?[facility]   3. Do they need a 30 day or 90 day supply? 90 day

## 2021-05-01 ENCOUNTER — Telehealth: Payer: Self-pay

## 2021-05-01 MED ORDER — TORSEMIDE 20 MG PO TABS: ORAL_TABLET | ORAL | 0 refills | Status: DC

## 2021-05-01 NOTE — Telephone Encounter (Signed)
Patient calling.   She states she is holding fluid and has SOB.   She currently takes Torsemide -3 tablets (60 mg) once daily on Mondays Wednesdays & Fridays, and take 2 tablets (40 mg) once daily on all other days Which was sent in yesterday (04/30/2021)  She is wanting more torsemide to take on Tuesday, Thursday, Saturday, Sunday.

## 2021-05-01 NOTE — Telephone Encounter (Signed)
I spoke with the patient.  She had an ICM visit with Sharman Cheek, RN on 04/28/21 and her Optivol levels were showing fluid retention at that time.  However, the patient was asymptomatic. Readings were discussed with Dr. [name] and orders received to continue current medications and close follow up.  The patient has called today stating that her SOB has increased over the last 2 days. She is having to stop to rest when walking. She has recently had her medications blister packed, but is requesting some PRN tablets of torsemide.  I inquired from the patient what her weight is currently running. She states she last weighed on Wednesday (1/25) and she was 211 lbs. A week ago she was 205 lbs.  The patient confirms she is currently taking: Torsemide 20 mg- 3 tablets (60 mg) Mondays, Wednesdays, & Fridays, and 2 tablets (40 mg) all other days of the week.  The patient confirms compliance with her medications since having them blister packed. She just received them this way about a week ago.   The patient has CKD. I have advised her I will need to call in a separate RX for her for some PRN doses of torsemide. I have asked to her please take an extra tablet of torsemide (which will = 60 mg) tomorrow and Sunday. She is aware I will call her back on Monday to follow up on how she is doing.  She is also aware she will need to weigh daily and record these weight as well as closely monitor her sodium and oral fluid intake.  The patient voices understanding and is agreeable.

## 2021-05-02 NOTE — Telephone Encounter (Signed)
Heather Thanks SK

## 2021-05-04 ENCOUNTER — Telehealth: Payer: Self-pay | Admitting: Internal Medicine

## 2021-05-04 NOTE — Telephone Encounter (Signed)
Patient states she is SOB and states her fluid medication is not helping. States she was to call in today with an update. Please call to discuss.

## 2021-05-04 NOTE — Telephone Encounter (Signed)
See 1/30 phone note.

## 2021-05-04 NOTE — Telephone Encounter (Signed)
I spoke with the patient. She advised that she took 60 mg of torsemide Saturday/ Sunday as dicussed on Friday 05/01/21 (see 05/01/21 phone note).  She is not feeling any different in regards to her SOB. Weights: Saturday- 211 lbs Sunday- 210 lbs Today- 210 lbs  I have advised the patient that in light of her CKD, we should see her in the office to reassess. The patient is agreeable with seeing Dr. [name] on 1/31 at 8:20 am.  No further changes were made today.   The patient was very appreciative for the call today.

## 2021-05-05 ENCOUNTER — Encounter: Payer: Medicare HMO | Admitting: Internal Medicine

## 2021-05-05 DIAGNOSIS — I428 Other cardiomyopathies: Secondary | ICD-10-CM

## 2021-05-05 DIAGNOSIS — I5022 Chronic systolic (congestive) heart failure: Secondary | ICD-10-CM

## 2021-05-06 NOTE — Telephone Encounter (Addendum)
I spoke with the patient. She missed her appointment in the office yesterday with Dr. [name] as she thought it was today. The patient is still having ongoing symptoms of SOB.  I advised she still needs to be seen and offered her 0900 tomorrow with Dr. [name].   She states when she came in this morning she inquired if anyone could see her today, but there were no openings.  While on the phone, I looked at the schedules again and Cadence, PA had a cancellation at 1005, however the patient declined that appointment and said she would wait until tomorrow to see Dr. [name].  I inquired if she felt like she needed to be seen in the ER, but per the patient "I can wait until tomorrow."  I have advised the patient I will put her on the schedule for tomorrow morning at 0900 with Dr. [name]. I have advised her if symptoms worsen in any way, she should report to the ER for further evaluation and possible treatment.   The patient voices understanding and is agreeable.

## 2021-05-06 NOTE — Telephone Encounter (Signed)
Patient c/o Palpitations:  High priority if patient c/o lightheadedness, shortness of breath, or chest pain  How long have you had palpitations/irregular HR/ Afib? Are you having the symptoms now? 3 or 4 days   Are you currently experiencing lightheadedness, SOB or CP? SOB, jittery   Do you have a history of afib (atrial fibrillation) or irregular heart rhythm? yes  Have you checked your BP or HR? (document readings if available): doesn't have  Are you experiencing any other symptoms? No  Patient came by office - missed appt with Dr [name] yesterday, thought it was today.  Patient wants to be seen ASAP.  Please call to discuss.

## 2021-05-07 ENCOUNTER — Other Ambulatory Visit: Payer: Self-pay

## 2021-05-07 ENCOUNTER — Encounter: Payer: Self-pay | Admitting: Internal Medicine

## 2021-05-07 ENCOUNTER — Other Ambulatory Visit: Payer: Self-pay | Admitting: Internal Medicine

## 2021-05-07 ENCOUNTER — Ambulatory Visit (INDEPENDENT_AMBULATORY_CARE_PROVIDER_SITE_OTHER): Payer: Medicare HMO | Admitting: Internal Medicine

## 2021-05-07 VITALS — BP 102/74 | HR 102 | Ht 59.0 in | Wt 211.0 lb

## 2021-05-07 DIAGNOSIS — I428 Other cardiomyopathies: Secondary | ICD-10-CM

## 2021-05-07 DIAGNOSIS — Z01812 Encounter for preprocedural laboratory examination: Secondary | ICD-10-CM | POA: Diagnosis not present

## 2021-05-07 DIAGNOSIS — I493 Ventricular premature depolarization: Secondary | ICD-10-CM

## 2021-05-07 DIAGNOSIS — I4819 Other persistent atrial fibrillation: Secondary | ICD-10-CM

## 2021-05-07 DIAGNOSIS — Z01818 Encounter for other preprocedural examination: Secondary | ICD-10-CM

## 2021-05-07 DIAGNOSIS — Z95810 Presence of automatic (implantable) cardiac defibrillator: Secondary | ICD-10-CM

## 2021-05-07 DIAGNOSIS — Z79899 Other long term (current) drug therapy: Secondary | ICD-10-CM

## 2021-05-07 DIAGNOSIS — I10 Essential (primary) hypertension: Secondary | ICD-10-CM | POA: Diagnosis not present

## 2021-05-07 DIAGNOSIS — I5022 Chronic systolic (congestive) heart failure: Secondary | ICD-10-CM

## 2021-05-07 NOTE — Progress Notes (Signed)
Katherine Rosales, female   DOB: 1950/07/06, 71 y.o.   MRN: [id number] Patient Care Team: Dion Body, MD as PCP - General (Family Medicine) Deboraha Sprang, MD as PCP - Cardiology (Cardiology)   HPI  Katherine Rosales is a 71 y.o. female Seen in followup for Medtronic  ICD implanted for nonischemic cardiomyopathy and primary prevention. She has chronic systolic failure. Generator replacement in 8/14.  10/21 acute heart failure associated with atrial fibrillation with a rapid rate.  Temporally related to discontinuing her CPAP  Now anticoagulated with Eliquis without  bleeding  Seen 4/22 with increasing burden of nonsustained ventricular tachycardia.  Resumed her Aldactone 12.5 and transition her back from metoprolol--carvedilol.  In the interval started her on Entresto and she is considerably better.  Event recorder demonstrated high-frequency PVCs now on amiodarone  Increasing problems with shortness of breath given a slight increase in her diuretic  Has been told over the last month by other physicians that she is in atrial fibrillation, she is unaware.  Over the last week however her breathing has become increasingly limiting.  No significant edema.     She notes she doesn't use her Cpap at night   Date Cr K TSH LFTs Hgb  8/19 1.6 4.5   11.7  3/20 1/2  4.4   11.9  10/20 1.2 4.1   11.6  5/21 1.3 4.6   (2/21) 11.8   12/21 1.31 3.8   (10/21)12.5  4/22 1.28 3.8     12/22 1.7    14.7      DATE TEST EF%   6/14 Echo   20-25 %   9/20 Echo   30-35 %   5/22 Echo 20-25%       Past Medical History:  Diagnosis Date   6949 Lead-Medtronic    Anemia 11/12/2016   Anxiety 12/25/2013   Automatic implantable cardioverter-defibrillator in situ    Breast cancer ([place]) 03/1995   right breast ca with lumpectomy and chemo and rad   Cardiomyopathy, nonischemic ([place])    a. Prior cath w/ nonobs CAD; b. s/p AICD w/ subsequent upgrade 11/2012-> MDT [place] XT VR single lead ICD (ser #  [id number]); c. 12/2018 Echo: EF 30-35%; d. 08/2020 Echo: EF 20-25%, glob HK. Mod elev RVSP. Mod dil LA, mild to mod RAE. Mod MR/TR. Mild-mod AoV sclerosis.   Chronic HFrEF (heart failure with reduced ejection fraction) ([place])    a. 12/2018 Echo: EF 30-35%; b. 08/2020 Echo: EF 20-25%, glob HK.   CKD (chronic kidney disease), stage III ([place]) 11/12/2016   Depression    Diabetes mellitus    Type II Diabetes   Hypertension    Hypothyroidism    ICD (implantable cardiac defibrillator), singleMedtronic    NASH (nonalcoholic steatohepatitis)    OSA (obstructive sleep apnea)    a. On CPAP.   PAF (paroxysmal atrial fibrillation) ([place])    a. 09/2020 s/p DCCV.   Personal history of chemotherapy 1996   right breast ca   Personal history of radiation therapy 1996   right breast ca   PVC's (premature ventricular contractions)    a. 08/2020 Zio: 8.1% PVC burden.   Sleep apnea    On CPAP   Spleen enlarged    Systolic heart failure, chronic ([place])     Past Surgical History:  Procedure Laterality Date   ABDOMINAL HYSTERECTOMY     BREAST BIOPSY Right 11/26/2015   - FAT NECROSIS, FIBROSIS, AND CHRONIC INFLAMMATION.    BREAST BIOPSY Right  12/20/2018   Affirm Bx- X-clip- path pending   BREAST BIOPSY WITH RADIO FREQUENCY LOCALIZER Right 10/18/2019   Procedure: RF TAG GUIDED EXCISIONAL BREAST BIOPSY;  Surgeon: Benjamine Sprague, DO;  Location: [facility] ORS;  Service: General;  Laterality: Right;   BREAST EXCISIONAL BIOPSY Right 03/1995   + breast ca chemo and rad. 9:00 region   BREAST EXCISIONAL BIOPSY Right 2011   surgical bx neg  11:00 region   BREAST LUMPECTOMY Right 1996   CARDIOVERSION N/A 02/13/2020   Procedure: CARDIOVERSION;  Surgeon: Kate Sable, MD;  Location: [facility] ORS;  Service: Cardiovascular;  Laterality: N/A;   CARDIOVERSION N/A 09/29/2020   Procedure: CARDIOVERSION;  Surgeon: Wellington Hampshire, MD;  Location: [facility] ORS;  Service: Cardiovascular;  Laterality: N/A;   CHOLECYSTECTOMY      COLONOSCOPY  03/05/2013   COLONOSCOPY W/ POLYPECTOMY  03/05/2013   COLONOSCOPY WITH PROPOFOL N/A 10/03/2017   Procedure: COLONOSCOPY WITH PROPOFOL;  Surgeon: Manya Silvas, MD;  Location: [facility] ENDOSCOPY;  Service: Endoscopy;  Laterality: N/A;   ESOPHAGOGASTRODUODENOSCOPY  01/11/2003, 06/25/2015   ESOPHAGOGASTRODUODENOSCOPY (EGD) WITH PROPOFOL N/A 06/25/2015   Procedure: ESOPHAGOGASTRODUODENOSCOPY (EGD) WITH PROPOFOL;  Surgeon: Manya Silvas, MD;  Location: [facility] ENDOSCOPY;  Service: Endoscopy;  Laterality: N/A;   FOOT SURGERY Right    IMPLANTABLE CARDIOVERTER DEFIBRILLATOR (ICD) GENERATOR CHANGE N/A 11/15/2012   Procedure: ICD GENERATOR CHANGE;  Surgeon: Deboraha Sprang, MD;  Location: [facility] CATH LAB;  Service: Cardiovascular;  Laterality: N/A;   JOINT REPLACEMENT  2009   Total Knee Replacement, Left   LEAD REVISION N/A 11/15/2012   Procedure: LEAD REVISION;  Surgeon: Deboraha Sprang, MD;  Location: [facility] CATH LAB;  Service: Cardiovascular;  Laterality: N/A;   PACEMAKER INSERTION  06/30/2005   Medtronic Virtuoso VR ICD - Nonischemic cardiomyopathy potentially related to adriamycin, class 3 congestive failture with a narrow QRS and a negative TDI   TONSILLECTOMY     TUBAL LIGATION      Current Outpatient Medications  Medication Sig Dispense Refill   alendronate (FOSAMAX) 70 MG tablet Take 70 mg by mouth every Thursday.     amiodarone (PACERONE) 200 MG tablet Take 200 mg by mouth daily.     apixaban (ELIQUIS) 5 MG TABS tablet TAKE 1 TABLET BY MOUTH TWICE DAILY 60 tablet 5   Ascorbic Acid (VITAMIN C) 1000 MG tablet Take 1,000 mg by mouth daily.     Calcium Carb-Cholecalciferol (CALCIUM 600 + D PO) Take 1 tablet by mouth 2 (two) times daily.     carvedilol (COREG) 3.125 MG tablet TAKE 1 TABLET BY MOUTH TWICE DAILY 60 tablet 5   CINNAMON PO Take 1,000 mg by mouth daily.     docusate sodium (COLACE) 100 MG capsule Take 300 mg by mouth at bedtime.      DULoxetine (CYMBALTA) 30 MG capsule Take 30 mg by  mouth daily.     empagliflozin (JARDIANCE) 25 MG TABS tablet Take 1 tablet by mouth daily.     gabapentin (NEURONTIN) 600 MG tablet Take 600 mg by mouth 2 (two) times daily.     glimepiride (AMARYL) 4 MG tablet Take 4 mg by mouth 2 (two) times daily before lunch and supper.      levothyroxine (SYNTHROID, LEVOTHROID) 100 MCG tablet Take 100 mcg by mouth daily.      Multiple Vitamin (MULTIVITAMIN WITH MINERALS) TABS tablet Take 1 tablet by mouth daily. Centrum Silver     Multiple Vitamins-Minerals (OCUVITE PO) Take 1 tablet by mouth daily.  Multiple Vitamins-Minerals (ZINC PO) Take 1 tablet by mouth 2 (two) times a week.     omeprazole (PRILOSEC) 20 MG capsule Take 20 mg by mouth daily.       sacubitril-valsartan (ENTRESTO) 24-26 MG Take 1 tablet by mouth 2 (two) times daily. 180 tablet 3   Semaglutide,0.25 or 0.5MG/DOS, (OZEMPIC, 0.25 OR 0.5 MG/DOSE,) 2 MG/1.5ML SOPN Inject 0.5 mg into the skin every Friday.     spironolactone (ALDACTONE) 25 MG tablet TAKE (1/2) TABLET BY MOUTH ONCE DAILY. 45 tablet 0   torsemide (DEMADEX) 20 MG tablet Take 3 tablets (60 mg) once daily on Mondays Wednesdays & Fridays, and take 2 tablets (40 mg) once daily on all other days 68 tablet 5   torsemide (DEMADEX) 20 MG tablet Take 1 extra tablet (20 mg) by mouth once daily as needed for shortness of breath 30 tablet 0   No current facility-administered medications for this visit.   Allergies  Allergen Reactions   Ibuprofen Other (See Comments)    Avoid due to stage 3 kidney disease     Review of Systems negative except from HPI and PMH  Physical Exam: BP 102/74 (BP Location: Left Arm, Patient Position: Sitting, Cuff Size: Large)    Pulse (!) 102    Ht 4' 11"  (1.499 m)    Wt 211 lb (95.7 kg)    SpO2 98%    BMI 42.62 kg/m  Well developed and well nourished in no acute distress HENT normal Neck supple with JVP-flat Clear Device pocket well healed; without hematoma or erythema.  There is no tethering   Irregularly irregular rate and rhythm without gallop No  murmur Abd-soft with active BS No Clubbing cyanosis edema Skin-warm and dry A & Oriented  Grossly normal sensory and motor function  ECG atrial fibrillation at 102 Interval-/18/45   Assessment and  Plan  Shortness of breath  \ Nonischemic cardiomyopathy  Heart failure chronic systolic  Hypertension   Orthostatic LH  PVCs High burden RBBB superior axis  Implantable defibrillator-Medtronic        Renal insufficiency grade 3 (creatinine 1.7 GFR 46. CG and 31 CKD-EPI (for obesity)  Sleep apnea  -- compliant  Atrial fibrillation-persistent holding sinus rhythm//Freq atrial ectopy and atrial tachycardia   VT nonsustained by device detection but suspect atrial tach by event recorder (As above)    Persistent atrial fibrillation.  We will plan cardioversion.  Hopefully the amiodarone being used for her PVCs will help to maintain sinus rhythm.  We will continue it at 200 mg a day.  She is on Eliquis we will continue this at 5 mg twice daily.  Blood pressure is well controlled on the carvedilol 3.125 Entresto 24-26 and Aldactone 12.5.  Blood pressure precludes much up titration.  We could use digoxin; however, I think you are better off proceeding with cardioversion.  Tolerating the amiodarone.  Surveillance laboratories are within range.  Hopefully her creatinine will improve with restoration of AV synchrony.  Orthostasis is stable.

## 2021-05-07 NOTE — Patient Instructions (Addendum)
Medication Instructions:  - Your physician recommends that you continue on your current medications as directed. Please refer to the Current Medication list given to you today.  *If you need a refill on your cardiac medications before your next appointment, please call your pharmacy*   Lab Work: - Your physician recommends that you have lab work today: BMP/ CBC   If you have labs (blood work) drawn today and your tests are completely normal, you will receive your results only by: MyChart Message (if you have MyChart) OR A paper copy in the mail If you have any lab test that is abnormal or we need to change your treatment, we will call you to review the results.   Testing/Procedures: - Your physician has recommended that you have a Cardioversion (DCCV). Electrical Cardioversion uses a jolt of electricity to your heart either through paddles or wired patches attached to your chest. This is a controlled, usually prescheduled, procedure. Defibrillation is done under light anesthesia in the hospital, and you usually go home the day of the procedure. This is done to get your heart back into a normal rhythm. You are not awake for the procedure.   You are scheduled for a Cardioversion on Friday May 08, 2021 with Dr. Rockey Situ.  Please arrive at the [facility] at 6:30 a.m. on the day of your procedure.  DIET INSTRUCTIONS:  Nothing to eat or drink after midnight the night prior to your procedure.         Labs: as above  Medications:  You may take all of your regular medications the morning of your procedure with enough water to get them down safely unless listed below:  - HOLD Jardiance, Glimeperide, & Torsemide the morning of your procedure  Must have a responsible person to drive you home.  Bring a current list of your medications and current insurance cards.    If you have any questions after you get home, please call the office at [phone]   Follow-Up:  Your next  appointment:   As scheduled   The format for your next appointment:   In Person  Provider:   [name], MD    Other Instructions  Electrical Cardioversion Electrical cardioversion is the delivery of a jolt of electricity to restore a normal rhythm to the heart. A rhythm that is too fast or is not regular keeps the heart from pumping well. In this procedure, sticky patches or metal paddles are placed on the chest to deliver electricity to the heart from a device. This procedure may be done in an emergency if: There is low or no blood pressure as a result of the heart rhythm. Normal rhythm must be restored as fast as possible to protect the brain and heart from further damage. It may save a life. This may also be a scheduled procedure for irregular or fast heart rhythms that are not immediately life-threatening. Tell a health care provider about: Any allergies you have. All medicines you are taking, including vitamins, herbs, eye drops, creams, and over-the-counter medicines. Any problems you or family members have had with anesthetic medicines. Any blood disorders you have. Any surgeries you have had. Any medical conditions you have. Whether you are pregnant or may be pregnant. What are the risks? Generally, this is a safe procedure. However, problems may occur, including: Allergic reactions to medicines. A blood clot that breaks free and travels to other parts of your body. The possible return of an abnormal heart rhythm  within hours or days after the procedure. Your heart stopping (cardiac arrest). This is rare. What happens before the procedure? Medicines Your health care provider may have you start taking: Blood-thinning medicines (anticoagulants) so your blood does not clot as easily. Medicines to help stabilize your heart rate and rhythm. Ask your health care provider about: Changing or stopping your regular medicines. This is especially important if you are taking  diabetes medicines or blood thinners. Taking medicines such as aspirin and ibuprofen. These medicines can thin your blood. Do not take these medicines unless your health care provider tells you to take them. Taking over-the-counter medicines, vitamins, herbs, and supplements. General instructions Follow instructions from your health care provider about eating or drinking restrictions. Plan to have someone take you home from the hospital or clinic. If you will be going home right after the procedure, plan to have someone with you for 24 hours. Ask your health care provider what steps will be taken to help prevent infection. These may include washing your skin with a germ-killing soap. What happens during the procedure?  An IV will be inserted into one of your veins. Sticky patches (electrodes) or metal paddles may be placed on your chest. You will be given a medicine to help you relax (sedative). An electrical shock will be delivered. The procedure may vary among health care providers and hospitals. What can I expect after the procedure? Your blood pressure, heart rate, breathing rate, and blood oxygen level will be monitored until you leave the hospital or clinic. Your heart rhythm will be watched to make sure it does not change. You may have some redness on the skin where the shocks were given. Follow these instructions at home: Do not drive for 24 hours if you were given a sedative during your procedure. Take over-the-counter and prescription medicines only as told by your health care provider. Ask your health care provider how to check your pulse. Check it often. Rest for 48 hours after the procedure or as told by your health care provider. Avoid or limit your caffeine use as told by your health care provider. Keep all follow-up visits as told by your health care provider. This is important. Contact a health care provider if: You feel like your heart is beating too quickly or your pulse  is not regular. You have a serious muscle cramp that does not go away. Get help right away if: You have discomfort in your chest. You are dizzy or you feel faint. You have trouble breathing or you are short of breath. Your speech is slurred. You have trouble moving an arm or leg on one side of your body. Your fingers or toes turn cold or blue. Summary Electrical cardioversion is the delivery of a jolt of electricity to restore a normal rhythm to the heart. This procedure may be done right away in an emergency or may be a scheduled procedure if the condition is not an emergency. Generally, this is a safe procedure. After the procedure, check your pulse often as told by your health care provider. This information is not intended to replace advice given to you by your health care provider. Make sure you discuss any questions you have with your health care provider. Document Revised: 10/23/2018 Document Reviewed: 10/23/2018 Elsevier Patient Education  [place].

## 2021-05-07 NOTE — H&P (View-Only) (Signed)
Katherine Rosales, female   DOB: 1950/10/27, 71 y.o.   MRN: [id number] Patient Care Team: Dion Body, MD as PCP - General (Family Medicine) Deboraha Sprang, MD as PCP - Cardiology (Cardiology)   HPI  Katherine Rosales is a 71 y.o. female Seen in followup for Medtronic  ICD implanted for nonischemic cardiomyopathy and primary prevention. She has chronic systolic failure. Generator replacement in 8/14.  10/21 acute heart failure associated with atrial fibrillation with a rapid rate.  Temporally related to discontinuing her CPAP  Now anticoagulated with Eliquis without  bleeding  Seen 4/22 with increasing burden of nonsustained ventricular tachycardia.  Resumed her Aldactone 12.5 and transition her back from metoprolol--carvedilol.  In the interval started her on Entresto and she is considerably better.  Event recorder demonstrated high-frequency PVCs now on amiodarone  Increasing problems with shortness of breath given a slight increase in her diuretic  Has been told over the last month by other physicians that she is in atrial fibrillation, she is unaware.  Over the last week however her breathing has become increasingly limiting.  No significant edema.     She notes she doesn't use her Cpap at night   Date Cr K TSH LFTs Hgb  8/19 1.6 4.5   11.7  3/20 1/2  4.4   11.9  10/20 1.2 4.1   11.6  5/21 1.3 4.6   (2/21) 11.8   12/21 1.31 3.8   (10/21)12.5  4/22 1.28 3.8     12/22 1.7    14.7      DATE TEST EF%   6/14 Echo   20-25 %   9/20 Echo   30-35 %   5/22 Echo 20-25%       Past Medical History:  Diagnosis Date   6949 Lead-Medtronic    Anemia 11/12/2016   Anxiety 12/25/2013   Automatic implantable cardioverter-defibrillator in situ    Breast cancer ([place]) 03/1995   right breast ca with lumpectomy and chemo and rad   Cardiomyopathy, nonischemic ([place])    a. Prior cath w/ nonobs CAD; b. s/p AICD w/ subsequent upgrade 11/2012-> MDT Evera XT VR single lead ICD (ser #  [id number]); c. 12/2018 Echo: EF 30-35%; d. 08/2020 Echo: EF 20-25%, glob HK. Mod elev RVSP. Mod dil LA, mild to mod RAE. Mod MR/TR. Mild-mod AoV sclerosis.   Chronic HFrEF (heart failure with reduced ejection fraction) ([place])    a. 12/2018 Echo: EF 30-35%; b. 08/2020 Echo: EF 20-25%, glob HK.   CKD (chronic kidney disease), stage III ([place]) 11/12/2016   Depression    Diabetes mellitus    Type II Diabetes   Hypertension    Hypothyroidism    ICD (implantable cardiac defibrillator), singleMedtronic    NASH (nonalcoholic steatohepatitis)    OSA (obstructive sleep apnea)    a. On CPAP.   PAF (paroxysmal atrial fibrillation) ([place])    a. 09/2020 s/p DCCV.   Personal history of chemotherapy 1996   right breast ca   Personal history of radiation therapy 1996   right breast ca   PVC's (premature ventricular contractions)    a. 08/2020 Zio: 8.1% PVC burden.   Sleep apnea    On CPAP   Spleen enlarged    Systolic heart failure, chronic ([place])     Past Surgical History:  Procedure Laterality Date   ABDOMINAL HYSTERECTOMY     BREAST BIOPSY Right 11/26/2015   - FAT NECROSIS, FIBROSIS, AND CHRONIC INFLAMMATION.    BREAST BIOPSY Right  12/20/2018   Affirm Bx- X-clip- path pending   BREAST BIOPSY WITH RADIO FREQUENCY LOCALIZER Right 10/18/2019   Procedure: RF TAG GUIDED EXCISIONAL BREAST BIOPSY;  Surgeon: Benjamine Sprague, DO;  Location: [facility] ORS;  Service: General;  Laterality: Right;   BREAST EXCISIONAL BIOPSY Right 03/1995   + breast ca chemo and rad. 9:00 region   BREAST EXCISIONAL BIOPSY Right 2011   surgical bx neg  11:00 region   BREAST LUMPECTOMY Right 1996   CARDIOVERSION N/A 02/13/2020   Procedure: CARDIOVERSION;  Surgeon: Kate Sable, MD;  Location: [facility] ORS;  Service: Cardiovascular;  Laterality: N/A;   CARDIOVERSION N/A 09/29/2020   Procedure: CARDIOVERSION;  Surgeon: Wellington Hampshire, MD;  Location: [facility] ORS;  Service: Cardiovascular;  Laterality: N/A;   CHOLECYSTECTOMY      COLONOSCOPY  03/05/2013   COLONOSCOPY W/ POLYPECTOMY  03/05/2013   COLONOSCOPY WITH PROPOFOL N/A 10/03/2017   Procedure: COLONOSCOPY WITH PROPOFOL;  Surgeon: Manya Silvas, MD;  Location[facility];  Service: Endoscopy;  Laterality: N/A;   ESOPHAGOGASTRODUODENOSCOPY  01/11/2003, 06/25/2015   ESOPHAGOGASTRODUODENOSCOPY (EGD) WITH PROPOFOL N/A 06/25/2015   Procedure: ESOPHAGOGASTRODUODENOSCOPY (EGD) WITH PROPOFOL;  Surgeon: Manya Silvas, MD;  Location: [facility] ENDOSCOPY;  Service: Endoscopy;  Laterality: N/A;   FOOT SURGERY Right    IMPLANTABLE CARDIOVERTER DEFIBRILLATOR (ICD) GENERATOR CHANGE N/A 11/15/2012   Procedure: ICD GENERATOR CHANGE;  Surgeon: Deboraha Sprang, MD;  Location: [facility] CATH LAB;  Service: Cardiovascular;  Laterality: N/A;   JOINT REPLACEMENT  2009   Total Knee Replacement, Left   LEAD REVISION N/A 11/15/2012   Procedure: LEAD REVISION;  Surgeon: Deboraha Sprang, MD;  Location[facility] CATH LAB;  Service: Cardiovascular;  Laterality: N/A;   PACEMAKER INSERTION  06/30/2005   Medtronic Virtuoso VR ICD - Nonischemic cardiomyopathy potentially related to adriamycin, class 3 congestive failture with a narrow QRS and a negative TDI   TONSILLECTOMY     TUBAL LIGATION      Current Outpatient Medications  Medication Sig Dispense Refill   alendronate (FOSAMAX) 70 MG tablet Take 70 mg by mouth every Thursday.     amiodarone (PACERONE) 200 MG tablet Take 200 mg by mouth daily.     apixaban (ELIQUIS) 5 MG TABS tablet TAKE 1 TABLET BY MOUTH TWICE DAILY 60 tablet 5   Ascorbic Acid (VITAMIN C) 1000 MG tablet Take 1,000 mg by mouth daily.     Calcium Carb-Cholecalciferol (CALCIUM 600 + D PO) Take 1 tablet by mouth 2 (two) times daily.     carvedilol (COREG) 3.125 MG tablet TAKE 1 TABLET BY MOUTH TWICE DAILY 60 tablet 5   CINNAMON PO Take 1,000 mg by mouth daily.     docusate sodium (COLACE) 100 MG capsule Take 300 mg by mouth at bedtime.      DULoxetine (CYMBALTA) 30 MG capsule Take 30 mg by  mouth daily.     empagliflozin (JARDIANCE) 25 MG TABS tablet Take 1 tablet by mouth daily.     gabapentin (NEURONTIN) 600 MG tablet Take 600 mg by mouth 2 (two) times daily.     glimepiride (AMARYL) 4 MG tablet Take 4 mg by mouth 2 (two) times daily before lunch and supper.      levothyroxine (SYNTHROID, LEVOTHROID) 100 MCG tablet Take 100 mcg by mouth daily.      Multiple Vitamin (MULTIVITAMIN WITH MINERALS) TABS tablet Take 1 tablet by mouth daily. Centrum Silver     Multiple Vitamins-Minerals (OCUVITE PO) Take 1 tablet by mouth daily.  Multiple Vitamins-Minerals (ZINC PO) Take 1 tablet by mouth 2 (two) times a week.     omeprazole (PRILOSEC) 20 MG capsule Take 20 mg by mouth daily.       sacubitril-valsartan (ENTRESTO) 24-26 MG Take 1 tablet by mouth 2 (two) times daily. 180 tablet 3   Semaglutide,0.25 or 0.5MG/DOS, (OZEMPIC, 0.25 OR 0.5 MG/DOSE,) 2 MG/1.5ML SOPN Inject 0.5 mg into the skin every Friday.     spironolactone (ALDACTONE) 25 MG tablet TAKE (1/2) TABLET BY MOUTH ONCE DAILY. 45 tablet 0   torsemide (DEMADEX) 20 MG tablet Take 3 tablets (60 mg) once daily on Mondays Wednesdays & Fridays, and take 2 tablets (40 mg) once daily on all other days 68 tablet 5   torsemide (DEMADEX) 20 MG tablet Take 1 extra tablet (20 mg) by mouth once daily as needed for shortness of breath 30 tablet 0   No current facility-administered medications for this visit.   Allergies  Allergen Reactions   Ibuprofen Other (See Comments)    Avoid due to stage 3 kidney disease     Review of Systems negative except from HPI and PMH  Physical Exam: BP 102/74 (BP Location: Left Arm, Patient Position: Sitting, Cuff Size: Large)    Pulse (!) 102    Ht 4' 11"  (1.499 m)    Wt 211 lb (95.7 kg)    SpO2 98%    BMI 42.62 kg/m  Well developed and well nourished in no acute distress HENT normal Neck supple with JVP-flat Clear Device pocket well healed; without hematoma or erythema.  There is no tethering   Irregularly irregular rate and rhythm without gallop No  murmur Abd-soft with active BS No Clubbing cyanosis edema Skin-warm and dry A & Oriented  Grossly normal sensory and motor function  ECG atrial fibrillation at 102 Interval-/18/45   Assessment and  Plan  Shortness of breath  \ Nonischemic cardiomyopathy  Heart failure chronic systolic  Hypertension   Orthostatic LH  PVCs High burden RBBB superior axis  Implantable defibrillator-Medtronic        Renal insufficiency grade 3 (creatinine 1.7 GFR 46. CG and 31 CKD-EPI (for obesity)  Sleep apnea  -- compliant  Atrial fibrillation-persistent holding sinus rhythm//Freq atrial ectopy and atrial tachycardia   VT nonsustained by device detection but suspect atrial tach by event recorder (As above)    Persistent atrial fibrillation.  We will plan cardioversion.  Hopefully the amiodarone being used for her PVCs will help to maintain sinus rhythm.  We will continue it at 200 mg a day.  She is on Eliquis we will continue this at 5 mg twice daily.  Blood pressure is well controlled on the carvedilol 3.125 Entresto 24-26 and Aldactone 12.5.  Blood pressure precludes much up titration.  We could use digoxin; however, I think you are better off proceeding with cardioversion.  Tolerating the amiodarone.  Surveillance laboratories are within range.  Hopefully her creatinine will improve with restoration of AV synchrony.  Orthostasis is stable.

## 2021-05-08 ENCOUNTER — Encounter: Payer: Self-pay | Admitting: Cardiovascular Disease

## 2021-05-08 ENCOUNTER — Ambulatory Visit
Admission: RE | Admit: 2021-05-08 | Discharge: 2021-05-08 | Disposition: A | Discharge status: Home / Self Care | Payer: Medicare HMO | Source: Ambulatory Visit | Attending: Cardiovascular Disease | Admitting: Cardiovascular Disease

## 2021-05-08 ENCOUNTER — Ambulatory Visit: Payer: Medicare HMO | Admitting: Registered Nurse

## 2021-05-08 ENCOUNTER — Encounter
Admission: RE | Disposition: A | Discharge status: Home / Self Care | Payer: Self-pay | Source: Ambulatory Visit | Attending: Cardiovascular Disease

## 2021-05-08 ENCOUNTER — Other Ambulatory Visit: Payer: Self-pay

## 2021-05-08 DIAGNOSIS — F419 Anxiety disorder, unspecified: Secondary | ICD-10-CM | POA: Diagnosis not present

## 2021-05-08 DIAGNOSIS — I4819 Other persistent atrial fibrillation: Secondary | ICD-10-CM | POA: Insufficient documentation

## 2021-05-08 DIAGNOSIS — Z7901 Long term (current) use of anticoagulants: Secondary | ICD-10-CM | POA: Diagnosis not present

## 2021-05-08 DIAGNOSIS — I43 Cardiomyopathy in diseases classified elsewhere: Secondary | ICD-10-CM | POA: Insufficient documentation

## 2021-05-08 DIAGNOSIS — Z7984 Long term (current) use of oral hypoglycemic drugs: Secondary | ICD-10-CM | POA: Insufficient documentation

## 2021-05-08 DIAGNOSIS — I130 Hypertensive heart and chronic kidney disease with heart failure and stage 1 through stage 4 chronic kidney disease, or unspecified chronic kidney disease: Secondary | ICD-10-CM | POA: Insufficient documentation

## 2021-05-08 DIAGNOSIS — E1122 Type 2 diabetes mellitus with diabetic chronic kidney disease: Secondary | ICD-10-CM | POA: Diagnosis not present

## 2021-05-08 DIAGNOSIS — E039 Hypothyroidism, unspecified: Secondary | ICD-10-CM | POA: Insufficient documentation

## 2021-05-08 DIAGNOSIS — I472 Ventricular tachycardia, unspecified: Secondary | ICD-10-CM | POA: Diagnosis not present

## 2021-05-08 DIAGNOSIS — G4733 Obstructive sleep apnea (adult) (pediatric): Secondary | ICD-10-CM | POA: Diagnosis not present

## 2021-05-08 DIAGNOSIS — N183 Chronic kidney disease, stage 3 unspecified: Secondary | ICD-10-CM | POA: Diagnosis not present

## 2021-05-08 DIAGNOSIS — Z95810 Presence of automatic (implantable) cardiac defibrillator: Secondary | ICD-10-CM | POA: Diagnosis not present

## 2021-05-08 DIAGNOSIS — I5022 Chronic systolic (congestive) heart failure: Secondary | ICD-10-CM | POA: Diagnosis not present

## 2021-05-08 DIAGNOSIS — Z79899 Other long term (current) drug therapy: Secondary | ICD-10-CM | POA: Diagnosis not present

## 2021-05-08 DIAGNOSIS — R0602 Shortness of breath: Secondary | ICD-10-CM | POA: Diagnosis not present

## 2021-05-08 DIAGNOSIS — F32A Depression, unspecified: Secondary | ICD-10-CM | POA: Diagnosis not present

## 2021-05-08 DIAGNOSIS — I4891 Unspecified atrial fibrillation: Secondary | ICD-10-CM | POA: Diagnosis not present

## 2021-05-08 HISTORY — PX: CARDIOVERSION: SHX1299

## 2021-05-08 LAB — CBC
Hematocrit: 40.1 % (ref 34.0–46.6)
Hemoglobin: 13.1 g/dL (ref 11.1–15.9)
MCH: 28.4 pg (ref 26.6–33.0)
MCHC: 32.7 g/dL (ref 31.5–35.7)
MCV: 87 fL (ref 79–97)
Platelets: 136 10*3/uL — ABNORMAL LOW (ref 150–450)
RBC: 4.62 x10E6/uL (ref 3.77–5.28)
RDW: 14.5 % (ref 11.7–15.4)
WBC: 5.4 10*3/uL (ref 3.4–10.8)

## 2021-05-08 LAB — BASIC METABOLIC PANEL
BUN/Creatinine Ratio: 15 (ref 12–28)
BUN: 36 mg/dL — ABNORMAL HIGH (ref 8–27)
CO2: 23 mmol/L (ref 20–29)
Calcium: 8.9 mg/dL (ref 8.7–10.3)
Chloride: 101 mmol/L (ref 96–106)
Creatinine, Ser: 2.41 mg/dL — ABNORMAL HIGH (ref 0.57–1.00)
Glucose: 176 mg/dL — ABNORMAL HIGH (ref 70–99)
Potassium: 4.1 mmol/L (ref 3.5–5.2)
Sodium: 140 mmol/L (ref 134–144)
eGFR: 21 mL/min/{1.73_m2} — ABNORMAL LOW (ref >59)

## 2021-05-08 LAB — GLUCOSE, CAPILLARY: Glucose-Capillary: 137 mg/dL — ABNORMAL HIGH (ref 70–99)

## 2021-05-08 SURGERY — CARDIOVERSION
Anesthesia: General

## 2021-05-08 MED ORDER — FENTANYL CITRATE (PF) 100 MCG/2ML IJ SOLN: 25.0000 ug | INTRAMUSCULAR | Status: DC | PRN

## 2021-05-08 MED ORDER — ONDANSETRON HCL 4 MG/2ML IJ SOLN: 4.0000 mg | Freq: Once | INTRAMUSCULAR | Status: DC | PRN

## 2021-05-08 MED ORDER — SODIUM CHLORIDE 0.9 % IV SOLN: INTRAVENOUS | Status: DC

## 2021-05-08 MED ORDER — PROPOFOL 10 MG/ML IV BOLUS
INTRAVENOUS | Status: DC | PRN
  Administered 2021-05-08: 40 mg via INTRAVENOUS
  Administered 2021-05-08: 20 mg via INTRAVENOUS

## 2021-05-08 MED ORDER — PHENYLEPHRINE HCL-NACL 20-0.9 MG/250ML-% IV SOLN
INTRAVENOUS | Status: AC
  Filled 2021-05-08: qty 250

## 2021-05-08 MED ORDER — PROPOFOL 500 MG/50ML IV EMUL
INTRAVENOUS | Status: AC
  Filled 2021-05-08: qty 50

## 2021-05-08 NOTE — Transfer of Care (Signed)
Immediate Anesthesia Transfer of Care Note  Patient: Katherine Rosales  Procedure(s) Performed: CARDIOVERSION  Patient Location: Special Procedures  Anesthesia Type:General  Level of Consciousness: drowsy  Airway & Oxygen Therapy: Patient Spontanous Breathing and Patient connected to nasal cannula oxygen  Post-op Assessment: Report given to RN and Post -op Vital signs reviewed and stable  Post vital signs: Reviewed and stable  Last Vitals:  Vitals Value Taken Time  BP 114/64 05/08/21 0742  Temp    Pulse 65 05/08/21 0747  Resp 17 05/08/21 0747  SpO2 93 % 05/08/21 0747    Last Pain:  Vitals:   05/08/21 0714  PainSc: 0-No pain         Complications: No notable events documented.

## 2021-05-08 NOTE — Interval H&P Note (Signed)
History and Physical Interval Note:  05/08/2021 8:13 AM  Katherine Rosales  has presented today for surgery, with the diagnosis of Cardioversion   AFib.  The various methods of treatment have been discussed with the patient and family. After consideration of risks, benefits and other options for treatment, the patient has consented to  Procedure(s): CARDIOVERSION (N/A) as a surgical intervention.  The patient's history has been reviewed, patient examined, no change in status, stable for surgery.  I have reviewed the patient's chart and labs.  Questions were answered to the patient's satisfaction.     Ida Rogue

## 2021-05-08 NOTE — H&P (Signed)
H&P Addendum, pre-cardioversion  Patient was seen and evaluated prior to -cardioversion procedure Symptoms, prior testing details again confirmed with the patient Patient examined, no significant change from prior exam Lab work reviewed in detail personally by myself Patient understands risk and benefit of the procedure,  The risks (stroke, cardiac arrhythmias rarely resulting in the need for a temporary or permanent pacemaker, skin irritation or burns and complications associated with conscious sedation including aspiration, arrhythmia, respiratory failure and death), benefits (restoration of normal sinus rhythm) and alternatives of a direct current cardioversion were explained in detail Patient willing to proceed.  Signed, Esmond Plants, MD, Ph.D [facility]

## 2021-05-08 NOTE — Anesthesia Preprocedure Evaluation (Addendum)
Anesthesia Evaluation  Patient identified by MRN, date of birth, ID band Patient awake    Reviewed: Allergy & Precautions, H&P , NPO status , Patient's Chart, lab work & pertinent test results, reviewed documented beta blocker date and time   Airway Mallampati: II   Neck ROM: full    Dental  (+) Teeth Intact   Pulmonary neg pulmonary ROS, sleep apnea and Continuous Positive Airway Pressure Ventilation ,    Pulmonary exam normal        Cardiovascular Exercise Tolerance: Poor hypertension, On Medications and Pt. on medications negative cardio ROS Normal cardiovascular examAtrial Fibrillation + Cardiac Defibrillator  Rhythm:regular Rate:Normal     Neuro/Psych PSYCHIATRIC DISORDERS Anxiety Depression negative neurological ROS  negative psych ROS   GI/Hepatic negative GI ROS, Neg liver ROS, (+) Hepatitis -  Endo/Other  negative endocrine ROSdiabetes, Poorly Controlled, Type 2, Oral Hypoglycemic AgentsHypothyroidism   Renal/GU Renal diseasenegative Renal ROS  negative genitourinary   Musculoskeletal   Abdominal   Peds  Hematology negative hematology ROS (+) Blood dyscrasia, anemia ,   Anesthesia Other Findings Past Medical History: No date: 6949 Lead-Medtronic 11/12/2016: Anemia 12/25/2013: Anxiety No date: Automatic implantable cardioverter-defibrillator in situ 03/1995: Breast cancer ([place])     Comment:  right breast ca with lumpectomy and chemo and rad No date: Cardiomyopathy, nonischemic ([place])     Comment:  a. Prior cath w/ nonobs CAD; b. s/p AICD w/ subsequent               upgrade 11/2012-> MDT Evera XT VR single lead ICD (ser #               [id number]); c. 12/2018 Echo: EF 30-35%; d. 08/2020 Echo:               EF 20-25%, glob HK. Mod elev RVSP. Mod dil LA, mild to               mod RAE. Mod MR/TR. Mild-mod AoV sclerosis. No date: Chronic HFrEF (heart failure with reduced ejection fraction)  ([place])     Comment:   a. 12/2018 Echo: EF 30-35%; b. 08/2020 Echo: EF 20-25%,               glob HK. 11/12/2016: CKD (chronic kidney disease), stage III ([facility]) No date: Depression No date: Diabetes mellitus     Comment:  Type II Diabetes No date: Hypertension No date: Hypothyroidism No date: ICD (implantable cardiac defibrillator), singleMedtronic No date: NASH (nonalcoholic steatohepatitis) No date: OSA (obstructive sleep apnea)     Comment:  a. On CPAP. No date: PAF (paroxysmal atrial fibrillation) ([place])     Comment:  a. 09/2020 s/p DCCV. 1996: Personal history of chemotherapy     Comment:  right breast ca 1996: Personal history of radiation therapy     Comment:  right breast ca No date: PVC's (premature ventricular contractions)     Comment:  a. 08/2020 Zio: 8.1% PVC burden. No date: Sleep apnea     Comment:  On CPAP No date: Spleen enlarged No date: Systolic heart failure, chronic ([place]) Past Surgical History: No date: ABDOMINAL HYSTERECTOMY 11/26/2015: BREAST BIOPSY; Right     Comment:  - FAT NECROSIS, FIBROSIS, AND CHRONIC INFLAMMATION.  12/20/2018: BREAST BIOPSY; Right     Comment:  Affirm Bx- X-clip- path pending 10/18/2019: BREAST BIOPSY WITH RADIO FREQUENCY LOCALIZER; Right     Comment:  Procedure: RF TAG GUIDED EXCISIONAL BREAST BIOPSY;  Surgeon: Benjamine Sprague, DO;  Location: [facility] ORS;  Service:              General;  Laterality: Right; 03/1995: BREAST EXCISIONAL BIOPSY; Right     Comment:  + breast ca chemo and rad. 9:00 region 2011: BREAST EXCISIONAL BIOPSY; Right     Comment:  surgical bx neg  11:00 region 1996: BREAST LUMPECTOMY; Right 02/13/2020: CARDIOVERSION; N/A     Comment:  Procedure: CARDIOVERSION;  Surgeon: Kate Sable,               MD;  Location: [facility] ORS;  Service: Cardiovascular;                Laterality: N/A; 09/29/2020: CARDIOVERSION; N/A     Comment:  Procedure: CARDIOVERSION;  Surgeon: Wellington Hampshire,               MD;  Location: [facility] ORS;   Service: Cardiovascular;                Laterality: N/A; No date: CHOLECYSTECTOMY 03/05/2013: COLONOSCOPY 03/05/2013: COLONOSCOPY W/ POLYPECTOMY 10/03/2017: COLONOSCOPY WITH PROPOFOL; N/A     Comment:  Procedure: COLONOSCOPY WITH PROPOFOL;  Surgeon: Manya Silvas, MD;  Location: [facility] ENDOSCOPY;  Service:               Endoscopy;  Laterality: N/A; 01/11/2003, 06/25/2015: ESOPHAGOGASTRODUODENOSCOPY 06/25/2015: ESOPHAGOGASTRODUODENOSCOPY (EGD) WITH PROPOFOL; N/A     Comment:  Procedure: ESOPHAGOGASTRODUODENOSCOPY (EGD) WITH               PROPOFOL;  Surgeon: Manya Silvas, MD;  Location: [facility]              ENDOSCOPY;  Service: Endoscopy;  Laterality: N/A; No date: FOOT SURGERY; Right 11/15/2012: IMPLANTABLE CARDIOVERTER DEFIBRILLATOR (ICD) GENERATOR  CHANGE; N/A     Comment:  Procedure: ICD GENERATOR CHANGE;  Surgeon: Deboraha Sprang, MD;  Location: [facility] CATH LAB;  Service:               Cardiovascular;  Laterality: N/A; 2009: JOINT REPLACEMENT     Comment:  Total Knee Replacement, Left 11/15/2012: LEAD REVISION; N/A     Comment:  Procedure: LEAD REVISION;  Surgeon: Deboraha Sprang, MD;               Location: [facility] CATH LAB;  Service: Cardiovascular;                Laterality: N/A; 06/30/2005: PACEMAKER INSERTION     Comment:  Medtronic Virtuoso VR ICD - Nonischemic cardiomyopathy               potentially related to adriamycin, class 3 congestive               failture with a narrow QRS and a negative TDI No date: TONSILLECTOMY No date: TUBAL LIGATION BMI    Body Mass Index: 42.62 kg/m     Reproductive/Obstetrics negative OB ROS                            Anesthesia Physical Anesthesia Plan  ASA: 4  Anesthesia Plan: General   Post-op Pain Management:    Induction:   PONV Risk Score and Plan:   Airway Management Planned:   Additional Equipment:   Intra-op Plan:   Post-operative  Plan:   Informed Consent: I have reviewed  the patients History and Physical, chart, labs and discussed the procedure including the risks, benefits and alternatives for the proposed anesthesia with the patient or authorized representative who has indicated his/her understanding and acceptance.     Dental Advisory Given  Plan Discussed with: CRNA  Anesthesia Plan Comments:         Anesthesia Quick Evaluation

## 2021-05-08 NOTE — CV Procedure (Signed)
Cardioversion procedure note For atrial fibrillation, persistent.  Procedure Details:  Consent: Risks of procedure as well as the alternatives and risks of each were explained to the (patient/caregiver).  Consent for procedure obtained.  Time Out: Verified patient identification, verified procedure, site/side was marked, verified correct patient position, special equipment/implants available, medications/allergies/relevent history reviewed, required imaging and test results available.  Performed  Patient placed on cardiac monitor, pulse oximetry, supplemental oxygen as necessary.   Sedation given: propofol IV, Dr. Andree Elk Pacer pads placed anterior and posterior chest.   Cardioverted 1 time(s).   Cardioverted at  150 J. Synchronized biphasic Converted to NSR   Evaluation: Findings: Post procedure EKG shows: NSR Complications: None Patient did tolerate procedure well.  Time Spent Directly with the Patient:  64 minutes   Esmond Plants, M.D., Ph.D.

## 2021-05-10 NOTE — Anesthesia Postprocedure Evaluation (Signed)
Anesthesia Post Note  Patient: Katherine Rosales  Procedure(s) Performed: CARDIOVERSION  Patient location during evaluation: PACU Anesthesia Type: General Level of consciousness: awake and alert Pain management: pain level controlled Vital Signs Assessment: post-procedure vital signs reviewed and stable Respiratory status: spontaneous breathing, nonlabored ventilation, respiratory function stable and patient connected to nasal cannula oxygen Cardiovascular status: blood pressure returned to baseline and stable Postop Assessment: no apparent nausea or vomiting Anesthetic complications: no   No notable events documented.   Last Vitals:  Vitals:   05/08/21 0801 05/08/21 0815  BP: 119/64 122/72  Pulse: 61 65  Resp: (!) 21 20  SpO2: 93% 96%    Last Pain:  Vitals:   05/08/21 0815  PainSc: 0-No pain                 Molli Barrows

## 2021-05-11 ENCOUNTER — Ambulatory Visit (INDEPENDENT_AMBULATORY_CARE_PROVIDER_SITE_OTHER): Payer: Medicare HMO

## 2021-05-11 DIAGNOSIS — I428 Other cardiomyopathies: Secondary | ICD-10-CM

## 2021-05-12 ENCOUNTER — Ambulatory Visit (INDEPENDENT_AMBULATORY_CARE_PROVIDER_SITE_OTHER): Payer: Medicare HMO

## 2021-05-12 DIAGNOSIS — I5022 Chronic systolic (congestive) heart failure: Secondary | ICD-10-CM | POA: Diagnosis not present

## 2021-05-12 DIAGNOSIS — Z95810 Presence of automatic (implantable) cardiac defibrillator: Secondary | ICD-10-CM

## 2021-05-12 LAB — CUP PACEART REMOTE DEVICE CHECK
Battery Remaining Longevity: 36 mo
Battery Voltage: 2.94 V
Brady Statistic RV Percent Paced: 0.05 %
Date Time Interrogation Session: 20230206022603
HighPow Impedance: 66 Ohm
Implantable Lead Implant Date: 20140813
Implantable Lead Location: 753860
Implantable Lead Model: 6935
Implantable Pulse Generator Implant Date: 20140813
Lead Channel Impedance Value: 323 Ohm
Lead Channel Impedance Value: 380 Ohm
Lead Channel Pacing Threshold Amplitude: 0.875 V
Lead Channel Pacing Threshold Pulse Width: 0.4 ms
Lead Channel Sensing Intrinsic Amplitude: 10.875 mV
Lead Channel Sensing Intrinsic Amplitude: 10.875 mV
Lead Channel Setting Pacing Amplitude: 2 V
Lead Channel Setting Pacing Pulse Width: 0.4 ms
Lead Channel Setting Sensing Sensitivity: 0.3 mV

## 2021-05-12 NOTE — Progress Notes (Signed)
EPIC Encounter for ICM Monitoring  Patient Name: Katherine Rosales is a 70 y.o. female Date: 05/12/2021 Primary Care Physican: Dion Body, MD Primary Cardiologist: [name] Electrophysiologist: [name] 04/08/2021 Weight: 211 lbs 04/17/2021 Weight: 208 lbs 04/28/2021 Weight: 208 lbs 05/12/2021 Weight:  213 lbs           Spoke with patient and heart failure questions reviewed.  Pt reports SOB continues when walking but breathing is okay at rest.  Breathing is unchanged since 2/2 OV.  Weight has been higher at 213 lbs for past week.  She reports she is taking prescribed Torsemide dosage + extra PRN Torsemide to = 60 mg daily.      Optivol thoracic impedance suggesting possible fluid accumulation starting 03/29/21 with 1 day at baseline on 04/18/21.   Prescribed:  Torsemide 20 mg Take 3 tablets (60 mg) once daily on Mondays Wednesdays & Fridays, and take 2 tablets (40 mg) once daily on all other days.  Daily dosage is in blister pack.   Torsemide 20 mg Take 1 extra tablet (20 mg) by mouth once daily as needed for shortness of breath Spironolactone 25 mg take 0.5 tablet (12.5 mg total) daily.   Labs: 05/07/2021 Creatinine 2.41, BUN 36, Potassium 4.1, Sodium 140, GFR 21 04/09/2021 Creatinine 2.20, BUN 29, Potassium 4.5, Sodium 138, GFR 23 03/31/2021 Creatinine 1.7,   BUN 26, Potassium 4.5, Sodium 139, GFR 30 07/07/2020 Creatinine 1.28, BUN 22, Potassium 3.8, Sodium 139, GFR 45 A complete set of results can be found in Results Review.   Recommendations:  Pt taking 60 mg daily Torsemide (regular dosage + PRN dosage).  Copy sent to Dr [name] for review and recommendations.      Follow-up plan: ICM clinic phone appointment on 05/19/2021 to recheck fluid levels.   91 day device clinic remote transmission 08/10/2021.    EP/Cardiology Office Visits: 06/09/2021 with Dr [name].   Copy of ICM check sent to Dr. [name].   3 month ICM trend: 05/12/2021.    12-14 Month ICM trend:     [name],  RN 05/12/2021 9:47 AM

## 2021-05-12 NOTE — Progress Notes (Signed)
Spoke with Dr Olin Pia nurse Nira Conn and she will consult with Dr [name] regarding any recommendations.  Nira Conn will update patient with recommendations if needed.

## 2021-05-13 ENCOUNTER — Telehealth: Payer: Self-pay | Admitting: Internal Medicine

## 2021-05-13 DIAGNOSIS — N1832 Chronic kidney disease, stage 3b: Secondary | ICD-10-CM

## 2021-05-13 DIAGNOSIS — I428 Other cardiomyopathies: Secondary | ICD-10-CM

## 2021-05-13 DIAGNOSIS — Z79899 Other long term (current) drug therapy: Secondary | ICD-10-CM

## 2021-05-13 DIAGNOSIS — I4819 Other persistent atrial fibrillation: Secondary | ICD-10-CM

## 2021-05-13 DIAGNOSIS — I5022 Chronic systolic (congestive) heart failure: Secondary | ICD-10-CM

## 2021-05-13 NOTE — Telephone Encounter (Signed)
Contacted by Sharman Cheek, RN (ICM clinic) yesterday regarding the patient's most recent Optivol transmission.  Optivol continues to be elevated s/p DCCV. Patient has been taking torsemide 60 mg once daily since her DCCV. Weight has trended up from 208 lbs (04/28/21) >> 213 lbs (05/12/21).   The patient is currently SOB with any ambulation.   I reached out to Dr. [name] by phone for further advisement in regards to th patient's fluid status, but also the fact that her most recent creatinine level was 2.4 on 05/07/21. I confirmed with the patient last week that her next nephrology appointment is not for ~ 3 months.   Per Dr. [name], he was going to reach out to the Advanced Heart Failure Team to see if they could weigh in on the patient.  Secure chat from Dr. [name] Dr. Haroldine Laws on 05/12/21: Linna Hoff. Would you mind looking at this ladys last note of mine And she has undergone intercurrent Cardioversion that at least initially was successful. Her weight is going up, and her creatinine last week was 2.4. Im wondering whether she would benefit from inpatient help from you guys right heart Kath etc.? Jerene Bears   Secure chat message received from Dr. Haroldine Laws today: I will look at her chart later today.   I attempted to contact the patient to update her of the above. No answer-  I left a detailed message on her identified voice mail that Dr. [name] has asked Dr. Haroldine Laws to help think through her situation and offer further recommendations.   I advised I will call her back once these are received, but she may reach out at any time with questions/ concerns.

## 2021-05-14 NOTE — Progress Notes (Signed)
Remote ICD transmission.   

## 2021-05-14 NOTE — Telephone Encounter (Signed)
Patient is calling has following questions to being admitted, please assist

## 2021-05-14 NOTE — Telephone Encounter (Signed)
Patient calling to discuss POC .   Aware Nira Conn will fu with her when update received.

## 2021-05-14 NOTE — Telephone Encounter (Signed)
Secure chat recommendations received from Dr. Haroldine Laws as below: It looks like she has refractory volume overload and aki post cardioversion. If she is still symptomatic and not responding to oral diuretics then should be admitted. At a minimum she needs repeat labs asap.   I called and spoke with the patient to follow up on how she is feeling today.  She advised "terrible, I didn't sleep last night." I inquired how her weight was today and she advised this was down to 211 lbs.  I advised the patient of the above recommendations from Dr. Haroldine Laws regarding admission. Per the patient "I will have to think about that." I advised I best, I need her to come to the [facility] either this afternoon or tomorrow for a repeat BMP to be done.  Per the patient, she will come in the morning for lab work.   I have sent a message to Dr. [name] advising him of my conversation with the patient.  I inquired if he would want to draw anything other than a BMP.  Awaiting MD response.  The patient is aware we will touch base with her once her labs result.   She voices understanding and is agreeable.

## 2021-05-14 NOTE — Telephone Encounter (Signed)
I spoke with the patient.  She had questions as to what would be done if she were admitted. I advised her that the main thing would be that she be admitted to [facility] where she could be on the cardiology service. She is aware that at that point, we would try to manage both her fluid/ kidney function more closely.  She is aware that this may require IV medications.  The patient voices understanding of the above. She will go to the lab 1st thing in the morning so we can have an idea of what her renal function looks like.   She is aware I will follow back up with her tomorrow, but is advised if symptoms significantly worsen over night, she should proceed to [facility].

## 2021-05-15 ENCOUNTER — Other Ambulatory Visit
Admission: RE | Admit: 2021-05-15 | Discharge: 2021-05-15 | Disposition: A | Discharge status: Home / Self Care | Payer: Medicare HMO | Attending: Internal Medicine | Admitting: Internal Medicine

## 2021-05-15 DIAGNOSIS — Z79899 Other long term (current) drug therapy: Secondary | ICD-10-CM | POA: Insufficient documentation

## 2021-05-15 DIAGNOSIS — I5022 Chronic systolic (congestive) heart failure: Secondary | ICD-10-CM | POA: Diagnosis not present

## 2021-05-15 DIAGNOSIS — I4819 Other persistent atrial fibrillation: Secondary | ICD-10-CM | POA: Insufficient documentation

## 2021-05-15 DIAGNOSIS — N1832 Chronic kidney disease, stage 3b: Secondary | ICD-10-CM | POA: Diagnosis not present

## 2021-05-15 DIAGNOSIS — I428 Other cardiomyopathies: Secondary | ICD-10-CM | POA: Diagnosis not present

## 2021-05-15 LAB — BASIC METABOLIC PANEL
Anion gap: 10 (ref 5–15)
BUN: 29 mg/dL — ABNORMAL HIGH (ref 8–23)
CO2: 29 mmol/L (ref 22–32)
Calcium: 8.7 mg/dL — ABNORMAL LOW (ref 8.9–10.3)
Chloride: 100 mmol/L (ref 98–111)
Creatinine, Ser: 2.04 mg/dL — ABNORMAL HIGH (ref 0.44–1.00)
GFR, Estimated: 26 mL/min — ABNORMAL LOW (ref >60)
Glucose, Bld: 92 mg/dL (ref 70–99)
Potassium: 3.4 mmol/L — ABNORMAL LOW (ref 3.5–5.1)
Sodium: 139 mmol/L (ref 135–145)

## 2021-05-15 LAB — BRAIN NATRIURETIC PEPTIDE: B Natriuretic Peptide: 875.3 pg/mL — ABNORMAL HIGH (ref 0.0–100.0)

## 2021-05-15 NOTE — Telephone Encounter (Signed)
The patient had her lab work checked this morning- BMP/ BNP  K+-  3.4 BUN- 29 Creatinine- 2.04 BNP- 875  Reviewed with Dr. [name] by phone. Per MD recommendations since the patient's weight was down yesterday and creatinine better, continue current medications: - recheck BMP on Wednesday - follow up on office on Thursday  I have called and spoken with the patient. She is aware of her lab results and MD recommendations as stated above.  The patient confirms her weight is 209 lbs today. She slept better last night and feels some better today.   Per the patient, she has appointments in town on Monday/ Tuesday and would prefer to come on Tuesday to be seen due to having to drive back/ forth so many days. I have offered the patient an appointment on Tuesday 05/19/21 at 9:40 am with Dr. [name]. She is unsure what time her other MD appointment is but will call us back to confirm. Patient aware slot held at 9:40 am on 2/14 pending her call back.   I have also advised her to increase the potassium in her diet over the weekend to help supplement slightly low K+ level.  The patient voices understanding of the above recommendations and is agreeable.

## 2021-05-15 NOTE — Progress Notes (Signed)
Per note from Alvis Lemmings, Dr Olin Pia nurse, patient is feeling better today 2/10 and weight has dropped to 209 lbs.  Pt will have fluid levels checked 2/14 by Dr [name]. ICM remote transmission schedule changed to 2/27.

## 2021-05-18 DIAGNOSIS — E1122 Type 2 diabetes mellitus with diabetic chronic kidney disease: Secondary | ICD-10-CM | POA: Diagnosis not present

## 2021-05-18 DIAGNOSIS — N1832 Chronic kidney disease, stage 3b: Secondary | ICD-10-CM | POA: Diagnosis not present

## 2021-05-18 DIAGNOSIS — E11649 Type 2 diabetes mellitus with hypoglycemia without coma: Secondary | ICD-10-CM | POA: Diagnosis not present

## 2021-05-18 DIAGNOSIS — Z794 Long term (current) use of insulin: Secondary | ICD-10-CM | POA: Diagnosis not present

## 2021-05-18 DIAGNOSIS — M810 Age-related osteoporosis without current pathological fracture: Secondary | ICD-10-CM | POA: Diagnosis not present

## 2021-05-18 DIAGNOSIS — E1143 Type 2 diabetes mellitus with diabetic autonomic (poly)neuropathy: Secondary | ICD-10-CM | POA: Diagnosis not present

## 2021-05-19 ENCOUNTER — Other Ambulatory Visit: Payer: Self-pay | Admitting: Gastroenterology

## 2021-05-19 ENCOUNTER — Other Ambulatory Visit: Payer: Self-pay | Admitting: Internal Medicine

## 2021-05-19 ENCOUNTER — Other Ambulatory Visit: Payer: Self-pay

## 2021-05-19 ENCOUNTER — Encounter: Payer: Self-pay | Admitting: Internal Medicine

## 2021-05-19 ENCOUNTER — Ambulatory Visit (INDEPENDENT_AMBULATORY_CARE_PROVIDER_SITE_OTHER): Payer: Medicare HMO

## 2021-05-19 ENCOUNTER — Ambulatory Visit: Payer: Medicare HMO | Admitting: Internal Medicine

## 2021-05-19 VITALS — BP 108/70 | HR 74 | Ht 59.0 in | Wt 207.0 lb

## 2021-05-19 DIAGNOSIS — I493 Ventricular premature depolarization: Secondary | ICD-10-CM | POA: Diagnosis not present

## 2021-05-19 DIAGNOSIS — I5022 Chronic systolic (congestive) heart failure: Secondary | ICD-10-CM | POA: Diagnosis not present

## 2021-05-19 DIAGNOSIS — I4589 Other specified conduction disorders: Secondary | ICD-10-CM | POA: Diagnosis not present

## 2021-05-19 DIAGNOSIS — Z95810 Presence of automatic (implantable) cardiac defibrillator: Secondary | ICD-10-CM | POA: Diagnosis not present

## 2021-05-19 DIAGNOSIS — N1832 Chronic kidney disease, stage 3b: Secondary | ICD-10-CM

## 2021-05-19 DIAGNOSIS — I428 Other cardiomyopathies: Secondary | ICD-10-CM

## 2021-05-19 DIAGNOSIS — I4819 Other persistent atrial fibrillation: Secondary | ICD-10-CM

## 2021-05-19 DIAGNOSIS — K7460 Unspecified cirrhosis of liver: Secondary | ICD-10-CM | POA: Diagnosis not present

## 2021-05-19 DIAGNOSIS — Z79899 Other long term (current) drug therapy: Secondary | ICD-10-CM | POA: Diagnosis not present

## 2021-05-19 DIAGNOSIS — I4729 Other ventricular tachycardia: Secondary | ICD-10-CM

## 2021-05-19 DIAGNOSIS — K7469 Other cirrhosis of liver: Secondary | ICD-10-CM

## 2021-05-19 LAB — PACEMAKER DEVICE OBSERVATION

## 2021-05-19 NOTE — Progress Notes (Signed)
Katherine Rosales, female   DOB: 1950-05-23, 71 y.o.   MRN: [id number] Patient Care Team: Dion Body, MD as PCP - General (Family Medicine) Deboraha Sprang, MD as PCP - Cardiology (Cardiology)   HPI  Katherine Rosales is a 71 y.o. female Seen in followup for Medtronic  ICD implanted for nonischemic cardiomyopathy and primary prevention. She has chronic systolic failure. Generator replacement in 8/14.  10/21 acute heart failure associated with atrial fibrillation with a rapid rate.  Temporally related to discontinuing her CPAP  Now anticoagulated with Eliquis without  bleeding  Seen 4/22 with increasing burden of nonsustained ventricular tachycardia.  Resumed her Aldactone 12.5 and transition her back from metoprolol--carvedilol.  In the interval started her on Entresto and she is considerably better.  Event recorder demonstrated high-frequency PVCs 12/22 started back on amiodarone Repeat monitor pending  Over the last weeks, increasing problems with shortness of breath for which she was given more diuretic; concurrent with this was atrial fibrillation for which she underwent cardioversion 05/08/2021  Cardio renal manifestations with a creatinine going from 1.3--2.4 from 4/22--2/23; continued with diuretics with some interval improvement over the last week or so.  Discussed with Dr. Reine Just concerning the need for hospitalization right heart catheterization and inotropic support.  Much improved over the last week.  Diuresing.  Without edema.  Able to walk from her car to the office today without stopping.  No nocturnal dyspnea.  No chest pain.  Patient denies symptoms of GI intolerance, sun sensitivity, neurological symptoms attributable to amiodarone.      She notes she doesn't use her Cpap at night   Date Cr K BNP TSH LFTs Hgb  8/19 1.6 4.5    11.7  3/20 1/2  4.4    11.9  10/20 1.2 4.1    11.6  5/21 1.3 4.6    (2/21) 11.8   12/21 1.31 3.8    (10/21)12.5  4/22 1.28 3.8      12/22  1.7     14.7  05/07/21 2.41 4.1      05/15/21 2.04 3.4 875     2/14 1.99 3.7  8.15        DATE TEST EF%   6/14 Echo   20-25 %   9/20 Echo   30-35 %   5/22 Echo 20-25%       Past Medical History:  Diagnosis Date   6949 Lead-Medtronic    Anemia 11/12/2016   Anxiety 12/25/2013   Automatic implantable cardioverter-defibrillator in situ    Breast cancer ([place]) 03/1995   right breast ca with lumpectomy and chemo and rad   Cardiomyopathy, nonischemic ([place])    a. Prior cath w/ nonobs CAD; b. s/p AICD w/ subsequent upgrade 11/2012-> MDT [place] XT VR single lead ICD (ser # [id number]); c. 12/2018 Echo: EF 30-35%; d. 08/2020 Echo: EF 20-25%, glob HK. Mod elev RVSP. Mod dil LA, mild to mod RAE. Mod MR/TR. Mild-mod AoV sclerosis.   Chronic HFrEF (heart failure with reduced ejection fraction) ([place])    a. 12/2018 Echo: EF 30-35%; b. 08/2020 Echo: EF 20-25%, glob HK.   CKD (chronic kidney disease), stage III ([place]) 11/12/2016   Depression    Diabetes mellitus    Type II Diabetes   Hypertension    Hypothyroidism    ICD (implantable cardiac defibrillator), singleMedtronic    NASH (nonalcoholic steatohepatitis)    OSA (obstructive sleep apnea)    a. On CPAP.   PAF (paroxysmal atrial fibrillation) ([place])  a. 09/2020 s/p DCCV.   Personal history of chemotherapy 1996   right breast ca   Personal history of radiation therapy 1996   right breast ca   PVC's (premature ventricular contractions)    a. 08/2020 Zio: 8.1% PVC burden.   Sleep apnea    On CPAP   Spleen enlarged    Systolic heart failure, chronic ([place])     Past Surgical History:  Procedure Laterality Date   ABDOMINAL HYSTERECTOMY     BREAST BIOPSY Right 11/26/2015   - FAT NECROSIS, FIBROSIS, AND CHRONIC INFLAMMATION.    BREAST BIOPSY Right 12/20/2018   Affirm Bx- X-clip- path pending   BREAST BIOPSY WITH RADIO FREQUENCY LOCALIZER Right 10/18/2019   Procedure: RF TAG GUIDED EXCISIONAL BREAST BIOPSY;  Surgeon: Benjamine Sprague, DO;  Location:  [facility] ORS;  Service: General;  Laterality: Right;   BREAST EXCISIONAL BIOPSY Right 03/1995   + breast ca chemo and rad. 9:00 region   BREAST EXCISIONAL BIOPSY Right 2011   surgical bx neg  11:00 region   BREAST LUMPECTOMY Right 1996   CARDIOVERSION N/A 02/13/2020   Procedure: CARDIOVERSION;  Surgeon: Kate Sable, MD;  Location: [facility] ORS;  Service: Cardiovascular;  Laterality: N/A;   CARDIOVERSION N/A 09/29/2020   Procedure: CARDIOVERSION;  Surgeon: Wellington Hampshire, MD;  Location: [place] [facility];  Service: Cardiovascular;  Laterality: N/A;   CARDIOVERSION N/A 05/08/2021   Procedure: CARDIOVERSION;  Surgeon: Minna Merritts, MD;  Location: [facility] ORS;  Service: Cardiovascular;  Laterality: N/A;   CHOLECYSTECTOMY     COLONOSCOPY  03/05/2013   COLONOSCOPY W/ POLYPECTOMY  03/05/2013   COLONOSCOPY WITH PROPOFOL N/A 10/03/2017   Procedure: COLONOSCOPY WITH PROPOFOL;  Surgeon: Manya Silvas, MD;  Location: [facility] ENDOSCOPY;  Service: Endoscopy;  Laterality: N/A;   ESOPHAGOGASTRODUODENOSCOPY  01/11/2003, 06/25/2015   ESOPHAGOGASTRODUODENOSCOPY (EGD) WITH PROPOFOL N/A 06/25/2015   Procedure: ESOPHAGOGASTRODUODENOSCOPY (EGD) WITH PROPOFOL;  Surgeon: Manya Silvas, MD;  Location: [facility] ENDOSCOPY;  Service: Endoscopy;  Laterality: N/A;   FOOT SURGERY Right    IMPLANTABLE CARDIOVERTER DEFIBRILLATOR (ICD) GENERATOR CHANGE N/A 11/15/2012   Procedure: ICD GENERATOR CHANGE;  Surgeon: Deboraha Sprang, MD;  Location: [facility] CATH LAB;  Service: Cardiovascular;  Laterality: N/A;   JOINT REPLACEMENT  2009   Total Knee Replacement, Left   LEAD REVISION N/A 11/15/2012   Procedure: LEAD REVISION;  Surgeon: Deboraha Sprang, MD;  Location: [facility] CATH LAB;  Service: Cardiovascular;  Laterality: N/A;   PACEMAKER INSERTION  06/30/2005   Medtronic Virtuoso VR ICD - Nonischemic cardiomyopathy potentially related to adriamycin, class 3 congestive failture with a narrow QRS and a negative TDI   TONSILLECTOMY     TUBAL LIGATION       Current Outpatient Medications  Medication Sig Dispense Refill   amiodarone (PACERONE) 200 MG tablet Take 200 mg by mouth in the morning.     apixaban (ELIQUIS) 5 MG TABS tablet TAKE 1 TABLET BY MOUTH TWICE DAILY 60 tablet 5   Ascorbic Acid (VITAMIN C) 1000 MG tablet Take 1,000 mg by mouth in the morning.     Calcium Carb-Cholecalciferol (CALCIUM 600 + D PO) Take 1 tablet by mouth in the morning.     carvedilol (COREG) 3.125 MG tablet TAKE 1 TABLET BY MOUTH TWICE DAILY 60 tablet 5   CINNAMON PO Take 1,000 mg by mouth in the morning.     docusate sodium (COLACE) 100 MG capsule Take 300 mg by mouth at bedtime.      DULoxetine (CYMBALTA)  60 MG capsule Take 60 mg by mouth daily.     empagliflozin (JARDIANCE) 25 MG TABS tablet Take 25 mg by mouth in the morning.     gabapentin (NEURONTIN) 600 MG tablet Take 600 mg by mouth 2 (two) times daily.     glimepiride (AMARYL) 2 MG tablet Take 1 tablet (2 mg) by mouth twice daily     levothyroxine (SYNTHROID, LEVOTHROID) 100 MCG tablet Take 100 mcg by mouth daily.      Multiple Vitamin (MULTIVITAMIN WITH MINERALS) TABS tablet Take 1 tablet by mouth daily. Centrum Silver     Multiple Vitamins-Minerals (OCUVITE PO) Take 1 tablet by mouth in the morning.     omeprazole (PRILOSEC) 20 MG capsule Take 20 mg by mouth in the morning.     sacubitril-valsartan (ENTRESTO) 24-26 MG Take 1 tablet by mouth 2 (two) times daily. 180 tablet 3   spironolactone (ALDACTONE) 25 MG tablet TAKE (1/2) TABLET BY MOUTH ONCE DAILY. 45 tablet 0   torsemide (DEMADEX) 20 MG tablet Take 3 tablets (60 mg) once daily on Mondays Wednesdays & Fridays, and take 2 tablets (40 mg) once daily on all other days (Patient taking differently: Take 60 mg by mouth daily.) 68 tablet 5   torsemide (DEMADEX) 20 MG tablet Take 1 extra tablet (20 mg) by mouth once daily as needed for shortness of breath 30 tablet 0   alendronate (FOSAMAX) 70 MG tablet Take 70 mg by mouth every Thursday. (Patient not  taking: Reported on 05/19/2021)     No current facility-administered medications for this visit.   Allergies  Allergen Reactions   Ibuprofen Other (See Comments)    Avoid due to stage 3 kidney disease     Review of Systems negative except from HPI and PMH  Physical Exam: BP 108/70 (BP Location: Left Arm, Patient Position: Sitting, Cuff Size: Normal)    Pulse 74    Ht 4' 11"  (1.499 m)    Wt 207 lb (93.9 kg)    BMI 41.81 kg/m  Well developed and well nourished in no acute distress HENT normal Neck supple with JVP-8 Clear Device pocket well healed; without hematoma or erythema.  There is no tethering  Regular rate and rhythm, no  gallop No murmur Abd-soft with active BS No Clubbing cyanosis  edema Skin-warm and dry A & Oriented  Grossly normal sensory and motor function  ECG sinus at 74 Intervals 21/18/47 IVCD  PVC isolated  Assessment and  Plan  Congestive heart failure-chronic with significant interval improvement \ Nonischemic cardiomyopathy  Hypertension   PVCs High burden RBBB superior axis  Implantable defibrillator-Medtronic        Renal insufficiency grade 3 - 4(creatinine 1.7 GFR 46. CG and 31 CKD-EPI (for obesity)  Sleep apnea  -- compliant  Atrial fibrillation-persistent holding sinus rhythm//Freq atrial ectopy and atrial tachycardia    Persistent atrial fibrillation status post cardioversion 10 days ago; holding sinus rhythm.  Hopefully with the amiodarone she will continue to sustain sinus rhythm.  We will continue at 200 mg a day.  PVC burden is hard to assess, undertake ZIO monitor for 3 days.  Congestive heart failure is much less acute.  She is still volume overloaded; we will continue her on her current torsemide 60 mg a day and check a metabolic profile and BNP today.  She was hypokalemic last week and so we will need to reassess all of these and determine from this diuretic doses going forward.  We will schedule for  outpatient heart failure  clinic with Dr. Reine Just.  Continue her carvedilol 3.125 Entresto 24/26 and spironolactone 12.5.  Thankfully no interval lightheadedness with her diuresis.

## 2021-05-19 NOTE — Patient Instructions (Addendum)
Medication Instructions:  - Your physician recommends that you continue on your current medications as directed. Please refer to the Current Medication list given to you today.  *If you need a refill on your cardiac medications before your next appointment, please call your pharmacy*   Lab Work: - Your physician recommends that you have lab work today: CMET/ TSH/ BNP  If you have labs (blood work) drawn today and your tests are completely normal, you will receive your results only by: MyChart Message (if you have [place]) OR A paper copy in the mail If you have any lab test that is abnormal or we need to change your treatment, we will call you to review the results.   Testing/Procedures:  1) You have been referred to : [facility]- Dr. Haroldine Laws in [place]   2) Heart Monitor:  Length of Wear: 3 days Placement: will be mailed to your home address within 3-4 business days  Your physician has recommended that you wear a Zio XT (heart) monitor.   This monitor is a medical device that records the hearts electrical activity. Doctors most often use these monitors to diagnose arrhythmias. Arrhythmias are problems with the speed or rhythm of the heartbeat. The monitor is a small device applied to your chest. You can wear one while you do your normal daily activities. While wearing this monitor if you have any symptoms to push the button and record what you felt. Once you have worn this monitor for the period of time provider prescribed (Usually 14 days), you will return the monitor device in the postage paid box. Once it is returned they will download the data collected and provide [place] with a report which the provider will then review and we will call you with those results. Important tips:  Avoid showering during the first 24 hours of wearing the monitor. Avoid excessive sweating to help maximize wear time. Do not submerge the device, no hot tubs, and no swimming  pools. Keep any lotions or oils away from the patch. After 24 hours you may shower with the patch on. Take brief showers with your back facing the shower head.  Do not remove patch once it has been placed because that will interrupt data and decrease adhesive wear time. Push the button when you have any symptoms and write down what you were feeling. Once you have completed wearing your monitor, remove and place into box which has postage paid and place in your outgoing mailbox.  If for some reason you have misplaced your box then call our office and we can provide another box and/or mail it off for you.    Follow-Up: At [facility], you and your health needs are our priority.  As part of our continuing mission to provide you with exceptional heart care, we have created designated Provider Care Teams.  These Care Teams include your primary Cardiologist (physician) and Advanced Practice Providers (APPs -  Physician Assistants and Nurse Practitioners) who all work together to provide you with the care you need, when you need it.  We recommend signing up for the patient portal called "MyChart".  Sign up information is provided on this After Visit Summary.  MyChart is used to connect with patients for Virtual Visits (Telemedicine).  Patients are able to view lab/test results, encounter notes, upcoming appointments, etc.  Non-urgent messages can be sent to your provider as well.   To learn more about what you can do with MyChart, go to [email or web address].  Your next appointment:   As scheduled    The format for your next appointment:   In Person  Provider:   [name], MD    Other Instructions N/a

## 2021-05-20 LAB — CUP PACEART INCLINIC DEVICE CHECK
Battery Remaining Longevity: 35 mo
Battery Voltage: 2.95 V
Brady Statistic RV Percent Paced: 0.1 %
Date Time Interrogation Session: 20230214112600
HighPow Impedance: 65 Ohm
Implantable Lead Implant Date: 20140813
Implantable Lead Location: 753860
Implantable Lead Model: 6935
Implantable Pulse Generator Implant Date: 20140813
Lead Channel Impedance Value: 304 Ohm
Lead Channel Impedance Value: 380 Ohm
Lead Channel Pacing Threshold Amplitude: 0.75 V
Lead Channel Pacing Threshold Pulse Width: 0.4 ms
Lead Channel Sensing Intrinsic Amplitude: 12.625 mV
Lead Channel Sensing Intrinsic Amplitude: 14.125 mV
Lead Channel Setting Pacing Amplitude: 2 V
Lead Channel Setting Pacing Pulse Width: 0.4 ms
Lead Channel Setting Sensing Sensitivity: 0.3 mV

## 2021-05-20 LAB — COMPREHENSIVE METABOLIC PANEL
ALT: 13 IU/L (ref 0–32)
AST: 22 IU/L (ref 0–40)
Albumin/Globulin Ratio: 1.6 (ref 1.2–2.2)
Albumin: 4.5 g/dL (ref 3.7–4.7)
Alkaline Phosphatase: 65 IU/L (ref 44–121)
BUN/Creatinine Ratio: 13 (ref 12–28)
BUN: 25 mg/dL (ref 8–27)
Bilirubin Total: 0.8 mg/dL (ref 0.0–1.2)
CO2: 25 mmol/L (ref 20–29)
Calcium: 9.6 mg/dL (ref 8.7–10.3)
Chloride: 97 mmol/L (ref 96–106)
Creatinine, Ser: 1.99 mg/dL — ABNORMAL HIGH (ref 0.57–1.00)
Globulin, Total: 2.9 g/dL (ref 1.5–4.5)
Glucose: 123 mg/dL — ABNORMAL HIGH (ref 70–99)
Potassium: 3.7 mmol/L (ref 3.5–5.2)
Sodium: 141 mmol/L (ref 134–144)
Total Protein: 7.4 g/dL (ref 6.0–8.5)
eGFR: 26 mL/min/{1.73_m2} — ABNORMAL LOW (ref >59)

## 2021-05-20 LAB — BRAIN NATRIURETIC PEPTIDE: BNP: 1014.7 pg/mL — ABNORMAL HIGH (ref 0.0–100.0)

## 2021-05-20 LAB — TSH: TSH: 8.15 u[IU]/mL — ABNORMAL HIGH (ref 0.450–4.500)

## 2021-05-21 ENCOUNTER — Telehealth: Payer: Self-pay | Admitting: Internal Medicine

## 2021-05-21 MED ORDER — LEVOTHYROXINE SODIUM 125 MCG PO TABS: 125.0000 ug | ORAL_TABLET | Freq: Every day | ORAL | 6 refills | Status: DC

## 2021-05-21 NOTE — Telephone Encounter (Signed)
Emily Filbert, RN  05/21/2021  5:19 PM EST Back to Top    Reviewed results verbally with Dr. [name]. Recommendations received from MD: 1) Continue current dose of torsemide 60 mg once daily until seen in follow up 2) Increase synthroid to 125 mcg once daily (will look at repeating her TSH at her next office visit)   The patient has been notified of the result and verbalized understanding.  All questions (if any) were answered. Alvis Lemmings, RN 05/21/2021 5:19 PM

## 2021-05-28 DIAGNOSIS — I493 Ventricular premature depolarization: Secondary | ICD-10-CM | POA: Diagnosis not present

## 2021-06-01 ENCOUNTER — Ambulatory Visit (INDEPENDENT_AMBULATORY_CARE_PROVIDER_SITE_OTHER): Payer: Medicare HMO

## 2021-06-01 DIAGNOSIS — I5022 Chronic systolic (congestive) heart failure: Secondary | ICD-10-CM

## 2021-06-01 DIAGNOSIS — Z95810 Presence of automatic (implantable) cardiac defibrillator: Secondary | ICD-10-CM

## 2021-06-03 NOTE — Progress Notes (Signed)
EPIC Encounter for ICM Monitoring  Patient Name: Katherine Rosales is a 71 y.o. female Date: 06/03/2021 Primary Care Physican: Dion Body, MD Primary Cardiologist: [name] Electrophysiologist: [name] 05/12/2021 Weight:  213 lbs  06/03/2021 Weight: 203 lbs          Spoke with patient and heart failure questions reviewed.  Pt reports feeling better since having cardioversion.     Optivol thoracic impedance suggesting fluid levels returned to normal.   Prescribed:  Torsemide 20 mg Take 3 tablets (60 mg) once daily. Daily dosage is in blister pack.   Torsemide 20 mg Take 1 extra tablet (20 mg) by mouth once daily as needed for shortness of breath Spironolactone 25 mg take 0.5 tablet (12.5 mg total) daily.   Labs: 05/19/2021 Creatinine 1.99, BUN 25, Potassium 3.7, Sodium 141 05/15/2021 Creatinine 2.04, BUN 29, Potassium 3.4, Sodium 139, GFR 26 05/07/2021 Creatinine 2.41, BUN 36, Potassium 4.1, Sodium 140, GFR 21 04/09/2021 Creatinine 2.20, BUN 29, Potassium 4.5, Sodium 138, GFR 23 03/31/2021 Creatinine 1.7,   BUN 26, Potassium 4.5, Sodium 139, GFR 30 07/07/2020 Creatinine 1.28, BUN 22, Potassium 3.8, Sodium 139, GFR 45 A complete set of results can be found in Results Review.   Recommendations:  Advised to drink 64 oz fluid daily to stay hydrated.     Follow-up plan: ICM clinic phone appointment on 06/29/2021.   91 day device clinic remote transmission 08/10/2021.    EP/Cardiology Office Visits: 06/09/2021 with Dr [name].   Copy of ICM check sent to Dr. [name].   3 month ICM trend: 06/01/2021.    12-14 Month ICM trend:     [name], RN 06/03/2021 3:29 PM

## 2021-06-05 DIAGNOSIS — M810 Age-related osteoporosis without current pathological fracture: Secondary | ICD-10-CM | POA: Diagnosis not present

## 2021-06-08 DIAGNOSIS — I493 Ventricular premature depolarization: Secondary | ICD-10-CM | POA: Diagnosis not present

## 2021-06-09 ENCOUNTER — Other Ambulatory Visit: Payer: Self-pay

## 2021-06-09 ENCOUNTER — Encounter: Payer: Self-pay | Admitting: Internal Medicine

## 2021-06-09 ENCOUNTER — Ambulatory Visit: Payer: Medicare HMO | Admitting: Internal Medicine

## 2021-06-09 VITALS — BP 100/70 | HR 70 | Ht 59.0 in | Wt 207.0 lb

## 2021-06-09 DIAGNOSIS — I428 Other cardiomyopathies: Secondary | ICD-10-CM

## 2021-06-09 DIAGNOSIS — I493 Ventricular premature depolarization: Secondary | ICD-10-CM

## 2021-06-09 DIAGNOSIS — I447 Left bundle-branch block, unspecified: Secondary | ICD-10-CM | POA: Diagnosis not present

## 2021-06-09 DIAGNOSIS — I5022 Chronic systolic (congestive) heart failure: Secondary | ICD-10-CM | POA: Diagnosis not present

## 2021-06-09 DIAGNOSIS — Z79899 Other long term (current) drug therapy: Secondary | ICD-10-CM | POA: Diagnosis not present

## 2021-06-09 DIAGNOSIS — I10 Essential (primary) hypertension: Secondary | ICD-10-CM

## 2021-06-09 DIAGNOSIS — Z95810 Presence of automatic (implantable) cardiac defibrillator: Secondary | ICD-10-CM | POA: Diagnosis not present

## 2021-06-09 DIAGNOSIS — I4819 Other persistent atrial fibrillation: Secondary | ICD-10-CM | POA: Diagnosis not present

## 2021-06-09 MED ORDER — SPIRONOLACTONE 25 MG PO TABS: ORAL_TABLET | ORAL | 0 refills | Status: DC

## 2021-06-09 NOTE — Patient Instructions (Signed)
Medication Instructions:  ?- Your physician has recommended you make the following change in your medication:  ? ?1) Take your spironolactone (aldactone) to bedtime (same dose) ? ?*If you need a refill on your cardiac medications before your next appointment, please call your pharmacy* ? ? ?Lab Work: ?- Your physician recommends that you have lab work today: BMP ? ?If you have labs (blood work) drawn today and your tests are completely normal, you will receive your results only by: ?MyChart Message (if you have MyChart) OR ?A paper copy in the mail ?If you have any lab test that is abnormal or we need to change your treatment, we will call you to review the results. ? ? ?Testing/Procedures: ?- none ordered ? ? ?Follow-Up: ?At [facility], you and your health needs are our priority.  As part of our continuing mission to provide you with exceptional heart care, we have created designated Provider Care Teams.  These Care Teams include your primary Cardiologist (physician) and Advanced Practice Providers (APPs -  Physician Assistants and Nurse Practitioners) who all work together to provide you with the care you need, when you need it. ? ?We recommend signing up for the patient portal called "MyChart".  Sign up information is provided on this After Visit Summary.  MyChart is used to connect with patients for Virtual Visits (Telemedicine).  Patients are able to view lab/test results, encounter notes, upcoming appointments, etc.  Non-urgent messages can be sent to your provider as well.   ?To learn more about what you can do with MyChart, go to [email or web address].   ? ?Your next appointment:   ?3 month(s) ? ?The format for your next appointment:   ?In Person ? ?Provider:   ?[name], MD  ? ? ?Other Instructions ?N/a ? ?

## 2021-06-09 NOTE — Progress Notes (Signed)
Katherine Rosales, female   DOB: 1951-01-31, 71 y.o.   MRN: [id number] Patient Care Team: Dion Body, MD as PCP - General (Family Medicine) Deboraha Sprang, MD as PCP - Cardiology (Cardiology)   HPI  Katherine Rosales is a 71 y.o. female Seen in followup for Medtronic  ICD implanted for nonischemic cardiomyopathy and primary prevention. She has chronic systolic failure. Generator replacement in 8/14.  10/21 acute heart failure associated with atrial fibrillation with a rapid rate.  Temporally related to discontinuing her CPAP  Now anticoagulated with Eliquis without  bleeding  Seen 4/22 with increasing burden of nonsustained ventricular tachycardia.  Resumed her Aldactone 12.5 and transition her back from metoprolol--carvedilol.  In the interval started her on Entresto and she is considerably better.  Event recorder demonstrated high-frequency PVCs 12/22 started back on amiodarone  2/23 Zio Personally reviewed  >> PVCs about 4.9%  O over recent weeks she has had increasing shortness of breath concurrent with atrial fibrillation for which she underwent cardioversion 05/08/2021 and increasing diuretics.  Creatinine was going up, continue to diurese gently; she is also been referred to the heart failure clinic   She continues to have less shortness of breath.  No edema.  No chest pain.  Some lightheadedness.  She actually fell on the day of her cardioversion.     Date Cr K BNP TSH LFTs Hgb  8/19 1.6 4.5    11.7  3/20 1/2  4.4    11.9  10/20 1.2 4.1    11.6  5/21 1.3 4.6    (2/21) 11.8   12/21 1.31 3.8    (10/21)12.5  4/22 1.28 3.8      12/22 1.7     14.7  05/07/21 2.41 4.1      05/15/21 2.04 3.4 875     2/14 1.99 3.7  8.15                        DATE TEST EF%   6/14 Echo   20-25 %   9/20 Echo   30-35 %   5/22 Echo 20-25%       Past Medical History:  Diagnosis Date   6949 Lead-Medtronic    Anemia 11/12/2016   Anxiety 12/25/2013   Automatic implantable  cardioverter-defibrillator in situ    Breast cancer ([place]) 03/1995   right breast ca with lumpectomy and chemo and rad   Cardiomyopathy, nonischemic ([place])    a. Prior cath w/ nonobs CAD; b. s/p AICD w/ subsequent upgrade 11/2012-> MDT Evera XT VR single lead ICD (ser # [id number]); c. 12/2018 Echo: EF 30-35%; d. 08/2020 Echo: EF 20-25%, glob HK. Mod elev RVSP. Mod dil LA, mild to mod RAE. Mod MR/TR. Mild-mod AoV sclerosis.   Chronic HFrEF (heart failure with reduced ejection fraction) ([place])    a. 12/2018 Echo: EF 30-35%; b. 08/2020 Echo: EF 20-25%, glob HK.   CKD (chronic kidney disease), stage III ([place]) 11/12/2016   Depression    Diabetes mellitus    Type II Diabetes   Hypertension    Hypothyroidism    ICD (implantable cardiac defibrillator), singleMedtronic    NASH (nonalcoholic steatohepatitis)    OSA (obstructive sleep apnea)    a. On CPAP.   PAF (paroxysmal atrial fibrillation) ([place])    a. 09/2020 s/p DCCV.   Personal history of chemotherapy 1996   right breast ca   Personal history of radiation therapy 1996   right breast ca  PVC's (premature ventricular contractions)    a. 08/2020 Zio: 8.1% PVC burden.   Sleep apnea    On CPAP   Spleen enlarged    Systolic heart failure, chronic ([place])     Past Surgical History:  Procedure Laterality Date   ABDOMINAL HYSTERECTOMY     BREAST BIOPSY Right 11/26/2015   - FAT NECROSIS, FIBROSIS, AND CHRONIC INFLAMMATION.    BREAST BIOPSY Right 12/20/2018   Affirm Bx- X-clip- path pending   BREAST BIOPSY WITH RADIO FREQUENCY LOCALIZER Right 10/18/2019   Procedure: RF TAG GUIDED EXCISIONAL BREAST BIOPSY;  Surgeon: Benjamine Sprague, DO;  Location: [facility] ORS;  Service: General;  Laterality: Right;   BREAST EXCISIONAL BIOPSY Right 03/1995   + breast ca chemo and rad. 9:00 region   BREAST EXCISIONAL BIOPSY Right 2011   surgical bx neg  11:00 region   BREAST LUMPECTOMY Right 1996   CARDIOVERSION N/A 02/13/2020   Procedure: CARDIOVERSION;  Surgeon:  Kate Sable, MD;  Location: [facility] ORS;  Service: Cardiovascular;  Laterality: N/A;   CARDIOVERSION N/A 09/29/2020   Procedure: CARDIOVERSION;  Surgeon: Wellington Hampshire, MD;  Location: [facility];  Service: Cardiovascular;  Laterality: N/A;   CARDIOVERSION N/A 05/08/2021   Procedure: CARDIOVERSION;  Surgeon: Minna Merritts, MD;  Location: [facility] ORS;  Service: Cardiovascular;  Laterality: N/A;   CHOLECYSTECTOMY     COLONOSCOPY  03/05/2013   COLONOSCOPY W/ POLYPECTOMY  03/05/2013   COLONOSCOPY WITH PROPOFOL N/A 10/03/2017   Procedure: COLONOSCOPY WITH PROPOFOL;  Surgeon: Manya Silvas, MD;  Location[facility] ENDOSCOPY;  Service: Endoscopy;  Laterality: N/A;   ESOPHAGOGASTRODUODENOSCOPY  01/11/2003, 06/25/2015   ESOPHAGOGASTRODUODENOSCOPY (EGD) WITH PROPOFOL N/A 06/25/2015   Procedure: ESOPHAGOGASTRODUODENOSCOPY (EGD) WITH PROPOFOL;  Surgeon: Manya Silvas, MD;  Location: [facility] ENDOSCOPY;  Service: Endoscopy;  Laterality: N/A;   FOOT SURGERY Right    IMPLANTABLE CARDIOVERTER DEFIBRILLATOR (ICD) GENERATOR CHANGE N/A 11/15/2012   Procedure: ICD GENERATOR CHANGE;  Surgeon: Deboraha Sprang, MD;  Location: [facility] CATH LAB;  Service: Cardiovascular;  Laterality: N/A;   JOINT REPLACEMENT  2009   Total Knee Replacement, Left   LEAD REVISION N/A 11/15/2012   Procedure: LEAD REVISION;  Surgeon: Deboraha Sprang, MD;  Location: [facility] CATH LAB;  Service: Cardiovascular;  Laterality: N/A;   PACEMAKER INSERTION  06/30/2005   Medtronic Virtuoso VR ICD - Nonischemic cardiomyopathy potentially related to adriamycin, class 3 congestive failture with a narrow QRS and a negative TDI   TONSILLECTOMY     TUBAL LIGATION      Current Outpatient Medications  Medication Sig Dispense Refill   amiodarone (PACERONE) 200 MG tablet Take 200 mg by mouth in the morning.     apixaban (ELIQUIS) 5 MG TABS tablet TAKE 1 TABLET BY MOUTH TWICE DAILY 60 tablet 5   Ascorbic Acid (VITAMIN C) 1000 MG tablet Take 1,000 mg by mouth in the  morning.     Calcium Carb-Cholecalciferol (CALCIUM 600 + D PO) Take 1 tablet by mouth in the morning.     carvedilol (COREG) 3.125 MG tablet TAKE 1 TABLET BY MOUTH TWICE DAILY 60 tablet 5   CINNAMON PO Take 1,000 mg by mouth in the morning.     denosumab (PROLIA) 60 MG/ML SOSY injection Inject 60 mg into the skin every 6 (six) months.     docusate sodium (COLACE) 100 MG capsule Take 300 mg by mouth at bedtime.      DULoxetine (CYMBALTA) 60 MG capsule Take 60 mg by mouth daily.  empagliflozin (JARDIANCE) 25 MG TABS tablet Take 25 mg by mouth in the morning.     gabapentin (NEURONTIN) 600 MG tablet Take 600 mg by mouth 2 (two) times daily.     glimepiride (AMARYL) 2 MG tablet Take 1 tablet (2 mg) by mouth twice daily     levothyroxine (SYNTHROID) 125 MCG tablet Take 1 tablet (125 mcg total) by mouth daily before breakfast. 30 tablet 6   Multiple Vitamin (MULTIVITAMIN WITH MINERALS) TABS tablet Take 1 tablet by mouth daily. Centrum Silver     Multiple Vitamins-Minerals (OCUVITE PO) Take 1 tablet by mouth in the morning.     omeprazole (PRILOSEC) 20 MG capsule Take 20 mg by mouth in the morning.     sacubitril-valsartan (ENTRESTO) 24-26 MG Take 1 tablet by mouth 2 (two) times daily. 180 tablet 3   spironolactone (ALDACTONE) 25 MG tablet TAKE (1/2) TABLET BY MOUTH ONCE DAILY. 15 tablet 0   torsemide (DEMADEX) 20 MG tablet Take 1 extra tablet (20 mg) by mouth once daily as needed for shortness of breath 30 tablet 0   torsemide (DEMADEX) 20 MG tablet Take 60 mg by mouth daily.     No current facility-administered medications for this visit.   Allergies  Allergen Reactions   Ibuprofen Other (See Comments)    Avoid due to stage 3 kidney disease     Review of Systems negative except from HPI and PMH  Physical Exam: BP 100/70 (BP Location: Left Arm, Patient Position: Sitting, Cuff Size: Large)    Pulse 70    Ht 4' 11"  (1.499 m)    Wt 207 lb (93.9 kg)    SpO2 96%    BMI 41.81 kg/m  Well  developed and well nourished in no acute distress HENT normal Neck supple   Clear Device pocket well healed; without hematoma or erythema.  There is no tethering  Regular rate and rhythm, no  gallop No murmur Abd-soft with active BS No Clubbing cyanosis  edema Skin-warm and dry A & Oriented  Grossly normal sensory and motor function  ECG sinus at 70 20/17/50 Left bundle branch block PVCs-frequent right bundle inferior axis with a very delayed intrinsicoid deflection     Assessment and  Plan  Congestive heart failure-chronic with significant interval improvement \ Nonischemic cardiomyopathy  Hypertension   PVCs reduced  RBBB superior axis  Implantable defibrillator-Medtronic        Renal insufficiency grade 3 - 4(creatinine 1.7 GFR 46. CG and 31 CKD-EPI (for obesity)  Sleep apnea  -- compliant  Atrial fibrillation-persistent holding sinus rhythm//Freq atrial ectopy and atrial tachycardia    Continues to hold sinus rhythm we will continue to 200 mg a day for both the atrial fibrillation as well as the PVCs.  PVC burden is down to about 4 and half percent.  ECG today shows a sequence of trigeminy.  The recent Zio patch is reassuring.  Volume status is much improved.  We will check her renal status today as well.  Continue her on her current diuretics of spironolactone at 12.5 and her Demadex 60 mg daily.  Some lightheadedness but does not seem worse to her with improved fluid status.  Continued and Entresto 24/26 the carvedilol 3.125 and the Jardiance.

## 2021-06-10 LAB — BASIC METABOLIC PANEL
BUN/Creatinine Ratio: 15 (ref 12–28)
BUN: 27 mg/dL (ref 8–27)
CO2: 25 mmol/L (ref 20–29)
Calcium: 8.7 mg/dL (ref 8.7–10.3)
Chloride: 100 mmol/L (ref 96–106)
Creatinine, Ser: 1.81 mg/dL — ABNORMAL HIGH (ref 0.57–1.00)
Glucose: 131 mg/dL — ABNORMAL HIGH (ref 70–99)
Potassium: 4 mmol/L (ref 3.5–5.2)
Sodium: 141 mmol/L (ref 134–144)
eGFR: 30 mL/min/{1.73_m2} — ABNORMAL LOW (ref >59)

## 2021-06-10 LAB — CUP PACEART INCLINIC DEVICE CHECK
Brady Statistic RV Percent Paced: 0.1 % — CL
Date Time Interrogation Session: 20230307113801
HighPow Impedance: 76 Ohm
Implantable Lead Implant Date: 20140813
Implantable Lead Location: 753860
Implantable Lead Model: 6935
Implantable Pulse Generator Implant Date: 20140813
Lead Channel Impedance Value: 380 Ohm
Lead Channel Pacing Threshold Amplitude: 1 V
Lead Channel Pacing Threshold Pulse Width: 0.4 ms
Lead Channel Sensing Intrinsic Amplitude: 16.8 mV

## 2021-06-11 ENCOUNTER — Telehealth: Payer: Self-pay | Admitting: Internal Medicine

## 2021-06-11 NOTE — Telephone Encounter (Signed)
I called the patient and spoke with her regarding her lab results. ?She voices understanding and is agreeable. ? ? ?

## 2021-06-11 NOTE — Telephone Encounter (Signed)
Please call with lab results 

## 2021-06-15 ENCOUNTER — Other Ambulatory Visit: Payer: Self-pay | Admitting: Internal Medicine

## 2021-06-16 ENCOUNTER — Ambulatory Visit
Admission: RE | Admit: 2021-06-16 | Discharge: 2021-06-16 | Disposition: A | Discharge status: Home / Self Care | Payer: Medicare HMO | Source: Ambulatory Visit | Attending: Gastroenterology | Admitting: Gastroenterology

## 2021-06-16 ENCOUNTER — Other Ambulatory Visit: Payer: Self-pay

## 2021-06-16 DIAGNOSIS — R161 Splenomegaly, not elsewhere classified: Secondary | ICD-10-CM | POA: Diagnosis not present

## 2021-06-16 DIAGNOSIS — K7460 Unspecified cirrhosis of liver: Secondary | ICD-10-CM | POA: Diagnosis not present

## 2021-06-16 DIAGNOSIS — K7469 Other cirrhosis of liver: Secondary | ICD-10-CM | POA: Diagnosis not present

## 2021-06-22 ENCOUNTER — Other Ambulatory Visit: Payer: Self-pay

## 2021-06-22 MED ORDER — TORSEMIDE 20 MG PO TABS: 60.0000 mg | ORAL_TABLET | Freq: Every day | ORAL | 0 refills | Status: DC

## 2021-06-22 NOTE — Telephone Encounter (Signed)
Ok to refill under Dr. [name]- thanks! ?

## 2021-06-22 NOTE — Telephone Encounter (Signed)
*  STAT* If patient is at the pharmacy, call can be transferred to refill team.   1. Which medications need to be refilled? (please list name of each medication and dose if known) Torsemide  2. Which pharmacy/location (including street and city if local pharmacy) is medication to be sent to? [facility]  3. Do they need a 30 day or 90 day supply? [place]

## 2021-06-22 NOTE — Telephone Encounter (Signed)
Please advise if OK to refill. Dr. [name] listed as primary cardiologist, but patient has seen Dr. Garen Lah in the past. Med currently listed under historical provider. Thank you! ?

## 2021-06-23 DIAGNOSIS — E1143 Type 2 diabetes mellitus with diabetic autonomic (poly)neuropathy: Secondary | ICD-10-CM | POA: Diagnosis not present

## 2021-06-23 DIAGNOSIS — E039 Hypothyroidism, unspecified: Secondary | ICD-10-CM | POA: Diagnosis not present

## 2021-06-23 DIAGNOSIS — N184 Chronic kidney disease, stage 4 (severe): Secondary | ICD-10-CM | POA: Diagnosis not present

## 2021-06-23 DIAGNOSIS — D693 Immune thrombocytopenic purpura: Secondary | ICD-10-CM | POA: Diagnosis not present

## 2021-06-29 ENCOUNTER — Ambulatory Visit (INDEPENDENT_AMBULATORY_CARE_PROVIDER_SITE_OTHER): Payer: Medicare HMO

## 2021-06-29 DIAGNOSIS — I5022 Chronic systolic (congestive) heart failure: Secondary | ICD-10-CM | POA: Diagnosis not present

## 2021-06-29 DIAGNOSIS — Z95810 Presence of automatic (implantable) cardiac defibrillator: Secondary | ICD-10-CM | POA: Diagnosis not present

## 2021-06-30 DIAGNOSIS — E1143 Type 2 diabetes mellitus with diabetic autonomic (poly)neuropathy: Secondary | ICD-10-CM | POA: Diagnosis not present

## 2021-06-30 DIAGNOSIS — F411 Generalized anxiety disorder: Secondary | ICD-10-CM | POA: Diagnosis not present

## 2021-06-30 DIAGNOSIS — Z9989 Dependence on other enabling machines and devices: Secondary | ICD-10-CM | POA: Diagnosis not present

## 2021-06-30 DIAGNOSIS — D693 Immune thrombocytopenic purpura: Secondary | ICD-10-CM | POA: Diagnosis not present

## 2021-06-30 DIAGNOSIS — G4733 Obstructive sleep apnea (adult) (pediatric): Secondary | ICD-10-CM | POA: Diagnosis not present

## 2021-06-30 DIAGNOSIS — E039 Hypothyroidism, unspecified: Secondary | ICD-10-CM | POA: Diagnosis not present

## 2021-06-30 DIAGNOSIS — F331 Major depressive disorder, recurrent, moderate: Secondary | ICD-10-CM | POA: Diagnosis not present

## 2021-06-30 DIAGNOSIS — N184 Chronic kidney disease, stage 4 (severe): Secondary | ICD-10-CM | POA: Diagnosis not present

## 2021-07-03 NOTE — Progress Notes (Signed)
EPIC Encounter for ICM Monitoring ? ?Patient Name: Katherine Rosales is a 71 y.o. female ?Date: 07/03/2021 ?Primary Care Physican: Dion Body, MD ?Primary Cardiologist: [name] ?Electrophysiologist: [name] ?07/03/2021 Weight: 203 lbs ?  ?       Spoke with patient and heart failure questions reviewed.  Pt asymptomatic for fluid accumulation.  Reports feeling well at this time and voices no complaints.  ?  ?Optivol thoracic impedance suggesting normal fluid levels. ?  ?Prescribed:  ?Torsemide 20 mg Take 3 tablets (60 mg) once daily. Daily dosage is in blister pack.   ?Torsemide 20 mg Take 1 extra tablet (20 mg) by mouth once daily as needed for shortness of breath ?Spironolactone 25 mg take 0.5 tablet (12.5 mg total) daily. ?  ?Labs: ?06/23/2021 Creatinine 1.9,   BUN 31, Potassium 3.9, Sodium 147, GFR 26 ?06/16/2021 Creatinine 1.9,   BUN 25, Potassium 4.1, Sodium 141, GFR 26  ?06/09/2021 Creatinine 1.81, BUN 27, Potassium 4.0, Sodium 141, GFR 30 ?05/19/2021 Creatinine 1.99, BUN 25, Potassium 3.7, Sodium 141 ?05/15/2021 Creatinine 2.04, BUN 29, Potassium 3.4, Sodium 139, GFR 26 ?05/07/2021 Creatinine 2.41, BUN 36, Potassium 4.1, Sodium 140, GFR 21 ?A complete set of results can be found in Results Review. ?  ?Recommendations:  No changes and encouraged to call if experiencing any fluid symptoms.  ?  ?Follow-up plan: ICM clinic phone appointment on 08/03/2021.   91 day device clinic remote transmission 08/10/2021.  ?  ?EP/Cardiology Office Visits: 09/08/2021 with Dr [name]. ?  ?Copy of ICM check sent to Dr. [name].  ? ?3 month ICM trend: 06/29/2021. ? ? ? ?12-14 Month ICM trend:  ? ? ? ?[name], RN ?07/03/2021 ?11:16 AM ? ?

## 2021-07-20 DIAGNOSIS — R809 Proteinuria, unspecified: Secondary | ICD-10-CM | POA: Diagnosis not present

## 2021-07-20 DIAGNOSIS — N1832 Chronic kidney disease, stage 3b: Secondary | ICD-10-CM | POA: Diagnosis not present

## 2021-07-20 DIAGNOSIS — N041 Nephrotic syndrome with focal and segmental glomerular lesions: Secondary | ICD-10-CM | POA: Diagnosis not present

## 2021-07-20 DIAGNOSIS — E1122 Type 2 diabetes mellitus with diabetic chronic kidney disease: Secondary | ICD-10-CM | POA: Diagnosis not present

## 2021-07-20 DIAGNOSIS — N2581 Secondary hyperparathyroidism of renal origin: Secondary | ICD-10-CM | POA: Diagnosis not present

## 2021-07-20 DIAGNOSIS — I129 Hypertensive chronic kidney disease with stage 1 through stage 4 chronic kidney disease, or unspecified chronic kidney disease: Secondary | ICD-10-CM | POA: Diagnosis not present

## 2021-07-20 DIAGNOSIS — N184 Chronic kidney disease, stage 4 (severe): Secondary | ICD-10-CM | POA: Diagnosis not present

## 2021-08-03 ENCOUNTER — Ambulatory Visit (INDEPENDENT_AMBULATORY_CARE_PROVIDER_SITE_OTHER): Payer: Medicare HMO

## 2021-08-03 DIAGNOSIS — Z95810 Presence of automatic (implantable) cardiac defibrillator: Secondary | ICD-10-CM | POA: Diagnosis not present

## 2021-08-03 DIAGNOSIS — I5022 Chronic systolic (congestive) heart failure: Secondary | ICD-10-CM

## 2021-08-06 ENCOUNTER — Other Ambulatory Visit: Payer: Self-pay | Admitting: Internal Medicine

## 2021-08-07 NOTE — Progress Notes (Signed)
EPIC Encounter for ICM Monitoring ? ?Patient Name: Katherine Rosales is a 71 y.o. female ?Date: 08/07/2021 ?Primary Care Physican: Dion Body, MD ?Primary Cardiologist: [name] ?Electrophysiologist: [name] ?07/03/2021 Weight: 203 lbs ?  ?       Spoke with patient and heart failure questions reviewed.  Pt asymptomatic for fluid accumulation.  Reports feeling well at this time and voices no complaints.  ?  ?Optivol thoracic impedance suggesting normal fluid levels. ?  ?Prescribed:  ?Torsemide 20 mg Take 3 tablets (60 mg) once daily. Daily dosage is in blister pack.   ?Torsemide 20 mg Take 1 extra tablet (20 mg) by mouth once daily as needed for shortness of breath ?Spironolactone 25 mg take 0.5 tablet (12.5 mg total) daily. ?  ?Labs: ?06/23/2021 Creatinine 1.9,   BUN 31, Potassium 3.9, Sodium 147, GFR 26 ?06/16/2021 Creatinine 1.9,   BUN 25, Potassium 4.1, Sodium 141, GFR 26  ?06/09/2021 Creatinine 1.81, BUN 27, Potassium 4.0, Sodium 141, GFR 30 ?05/19/2021 Creatinine 1.99, BUN 25, Potassium 3.7, Sodium 141 ?05/15/2021 Creatinine 2.04, BUN 29, Potassium 3.4, Sodium 139, GFR 26 ?05/07/2021 Creatinine 2.41, BUN 36, Potassium 4.1, Sodium 140, GFR 21 ?A complete set of results can be found in Results Review. ?  ?Recommendations:  No changes and encouraged to call if experiencing any fluid symptoms.  ?  ?Follow-up plan: ICM clinic phone appointment on 09/07/2021.   91 day device clinic remote transmission 08/10/2021.  ?  ?EP/Cardiology Office Visits: 09/08/2021 with Dr [name]. ?  ?Copy of ICM check sent to Dr. [name].  ? ?3 month ICM trend: 08/03/2021. ? ? ? ?12-14 Month ICM trend:  ? ? ? ?[name], RN ?08/07/2021 ?4:55 PM ? ?

## 2021-08-10 ENCOUNTER — Ambulatory Visit (INDEPENDENT_AMBULATORY_CARE_PROVIDER_SITE_OTHER): Payer: Medicare HMO

## 2021-08-10 DIAGNOSIS — I428 Other cardiomyopathies: Secondary | ICD-10-CM

## 2021-08-11 LAB — CUP PACEART REMOTE DEVICE CHECK
Battery Remaining Longevity: 33 mo
Battery Voltage: 2.94 V
Brady Statistic RV Percent Paced: 0.14 %
Date Time Interrogation Session: 20230508043623
HighPow Impedance: 90 Ohm
Implantable Lead Implant Date: 20140813
Implantable Lead Location: 753860
Implantable Lead Model: 6935
Implantable Pulse Generator Implant Date: 20140813
Lead Channel Impedance Value: 323 Ohm
Lead Channel Impedance Value: 399 Ohm
Lead Channel Pacing Threshold Amplitude: 0.875 V
Lead Channel Pacing Threshold Pulse Width: 0.4 ms
Lead Channel Sensing Intrinsic Amplitude: 16.375 mV
Lead Channel Sensing Intrinsic Amplitude: 16.375 mV
Lead Channel Setting Pacing Amplitude: 2 V
Lead Channel Setting Pacing Pulse Width: 0.4 ms
Lead Channel Setting Sensing Sensitivity: 0.3 mV

## 2021-08-26 NOTE — Progress Notes (Signed)
Remote ICD transmission.   

## 2021-09-04 ENCOUNTER — Other Ambulatory Visit: Payer: Self-pay | Admitting: Internal Medicine

## 2021-09-04 DIAGNOSIS — I4819 Other persistent atrial fibrillation: Secondary | ICD-10-CM

## 2021-09-04 NOTE — Telephone Encounter (Signed)
Refill request

## 2021-09-04 NOTE — Telephone Encounter (Signed)
Pt last saw Dr [name] 06/09/21, last labs 06/23/21 Creat 1.9, age 71, weight 93.9, based on specified criteria pt is on appropriate dosage of Eliquis 24m BID for afib.  Will refill rx.

## 2021-09-07 ENCOUNTER — Ambulatory Visit (INDEPENDENT_AMBULATORY_CARE_PROVIDER_SITE_OTHER): Payer: Medicare HMO

## 2021-09-07 DIAGNOSIS — Z95810 Presence of automatic (implantable) cardiac defibrillator: Secondary | ICD-10-CM | POA: Diagnosis not present

## 2021-09-07 DIAGNOSIS — I5022 Chronic systolic (congestive) heart failure: Secondary | ICD-10-CM | POA: Diagnosis not present

## 2021-09-07 NOTE — Progress Notes (Signed)
EPIC Encounter for ICM Monitoring  Patient Name: Katherine Rosales is a 71 y.o. female Date: 09/07/2021 Primary Care Physican: Dion Body, MD Primary Cardiologist: [name] Electrophysiologist: [name] 07/03/2021 Weight: 203 lbs          Spoke with patient and heart failure questions reviewed.  Pt asymptomatic for fluid accumulation.  Reports feeling well at this time and voices no complaints.    Optivol thoracic impedance suggesting normal fluid levels.   Prescribed:  Torsemide 20 mg Take 3 tablets (60 mg) once daily. Daily dosage is in blister pack.   Torsemide 20 mg Take 1 extra tablet (20 mg) by mouth once daily as needed for shortness of breath Spironolactone 25 mg take 0.5 tablet (12.5 mg total) daily.   Labs: 06/23/2021 Creatinine 1.9,   BUN 31, Potassium 3.9, Sodium 147, GFR 26 06/16/2021 Creatinine 1.9,   BUN 25, Potassium 4.1, Sodium 141, GFR 26  06/09/2021 Creatinine 1.81, BUN 27, Potassium 4.0, Sodium 141, GFR 30 05/19/2021 Creatinine 1.99, BUN 25, Potassium 3.7, Sodium 141 05/15/2021 Creatinine 2.04, BUN 29, Potassium 3.4, Sodium 139, GFR 26 05/07/2021 Creatinine 2.41, BUN 36, Potassium 4.1, Sodium 140, GFR 21 A complete set of results can be found in Results Review.   Recommendations:  No changes and encouraged to call if experiencing any fluid symptoms.    Follow-up plan: ICM clinic phone appointment on 10/12/2021.   91 day device clinic remote transmission 11/09/2021.    EP/Cardiology Office Visits: 09/08/2021 with Dr [name].   Copy of ICM check sent to Dr. [name].   3 month ICM trend: 09/07/2021.    12-14 Month ICM trend:     [name], RN 09/07/2021 10:31 AM

## 2021-09-08 ENCOUNTER — Ambulatory Visit: Payer: Medicare HMO | Admitting: Internal Medicine

## 2021-09-08 ENCOUNTER — Encounter: Payer: Self-pay | Admitting: Internal Medicine

## 2021-09-08 ENCOUNTER — Other Ambulatory Visit
Admission: RE | Admit: 2021-09-08 | Discharge: 2021-09-08 | Disposition: A | Discharge status: Home / Self Care | Payer: Medicare HMO | Attending: Internal Medicine | Admitting: Internal Medicine

## 2021-09-08 VITALS — BP 108/60 | HR 68 | Ht 59.0 in | Wt 214.0 lb

## 2021-09-08 DIAGNOSIS — I493 Ventricular premature depolarization: Secondary | ICD-10-CM | POA: Diagnosis not present

## 2021-09-08 DIAGNOSIS — I5022 Chronic systolic (congestive) heart failure: Secondary | ICD-10-CM | POA: Diagnosis not present

## 2021-09-08 DIAGNOSIS — I428 Other cardiomyopathies: Secondary | ICD-10-CM

## 2021-09-08 DIAGNOSIS — I4729 Other ventricular tachycardia: Secondary | ICD-10-CM

## 2021-09-08 DIAGNOSIS — Z95810 Presence of automatic (implantable) cardiac defibrillator: Secondary | ICD-10-CM | POA: Diagnosis not present

## 2021-09-08 DIAGNOSIS — N1832 Chronic kidney disease, stage 3b: Secondary | ICD-10-CM

## 2021-09-08 DIAGNOSIS — I4819 Other persistent atrial fibrillation: Secondary | ICD-10-CM

## 2021-09-08 DIAGNOSIS — Z79899 Other long term (current) drug therapy: Secondary | ICD-10-CM

## 2021-09-08 DIAGNOSIS — I10 Essential (primary) hypertension: Secondary | ICD-10-CM

## 2021-09-08 LAB — CUP PACEART INCLINIC DEVICE CHECK
Battery Remaining Longevity: 30 mo
Battery Voltage: 2.95 V
Brady Statistic RV Percent Paced: 0.11 %
Date Time Interrogation Session: 20230606163945
HighPow Impedance: 79 Ohm
Implantable Lead Implant Date: 20140813
Implantable Lead Location: 753860
Implantable Lead Model: 6935
Implantable Pulse Generator Implant Date: 20140813
Lead Channel Impedance Value: 323 Ohm
Lead Channel Impedance Value: 399 Ohm
Lead Channel Pacing Threshold Amplitude: 0.875 V
Lead Channel Pacing Threshold Pulse Width: 0.4 ms
Lead Channel Sensing Intrinsic Amplitude: 14.75 mV
Lead Channel Sensing Intrinsic Amplitude: 15.375 mV
Lead Channel Setting Pacing Amplitude: 2 V
Lead Channel Setting Pacing Pulse Width: 0.4 ms
Lead Channel Setting Sensing Sensitivity: 0.3 mV

## 2021-09-08 LAB — PACEMAKER DEVICE OBSERVATION

## 2021-09-08 LAB — BASIC METABOLIC PANEL
Anion gap: 9 (ref 5–15)
BUN: 28 mg/dL — ABNORMAL HIGH (ref 8–23)
CO2: 30 mmol/L (ref 22–32)
Calcium: 9.3 mg/dL (ref 8.9–10.3)
Chloride: 99 mmol/L (ref 98–111)
Creatinine, Ser: 1.91 mg/dL — ABNORMAL HIGH (ref 0.44–1.00)
GFR, Estimated: 28 mL/min — ABNORMAL LOW (ref >60)
Glucose, Bld: 125 mg/dL — ABNORMAL HIGH (ref 70–99)
Potassium: 4.1 mmol/L (ref 3.5–5.1)
Sodium: 138 mmol/L (ref 135–145)

## 2021-09-08 NOTE — Patient Instructions (Signed)
Medication Instructions:  - Your physician recommends that you continue on your current medications as directed. Please refer to the Current Medication list given to you today.  Patient Assistance Applications provided for: Jardiance & Eliquis   *If you need a refill on your cardiac medications before your next appointment, please call your pharmacy*   Lab Work: - Your physician recommends that you have lab work today:   [place] Entrance at [facility] 1st desk on the right to check in (REGISTRATION)  Lab hours: Monday- Friday (7:30 am- 5:30 pm)  If you have labs (blood work) drawn today and your tests are completely normal, you will receive your results only by: MyChart Message (if you have MyChart) OR A paper copy in the mail If you have any lab test that is abnormal or we need to change your treatment, we will call you to review the results.   Testing/Procedures: - none ordered   Follow-Up: At [facility], you and your health needs are our priority.  As part of our continuing mission to provide you with exceptional heart care, we have created designated Provider Care Teams.  These Care Teams include your primary Cardiologist (physician) and Advanced Practice Providers (APPs -  Physician Assistants and Nurse Practitioners) who all work together to provide you with the care you need, when you need it.  We recommend signing up for the patient portal called "MyChart".  Sign up information is provided on this After Visit Summary.  MyChart is used to connect with patients for Virtual Visits (Telemedicine).  Patients are able to view lab/test results, encounter notes, upcoming appointments, etc.  Non-urgent messages can be sent to your provider as well.   To learn more about what you can do with MyChart, go to [email or web address].    Your next appointment:   6 month(s)  The format for your next appointment:   In Person  Provider:   [name], MD    Other  Instructions N/a  Important Information About Sugar

## 2021-09-08 NOTE — Progress Notes (Signed)
Katherine Rosales, female   DOB: [birth year], 71 y.o.   MRN: [id number] Patient Care Team: Dion Body, MD as PCP - General (Family Medicine) Deboraha Sprang, MD as PCP - Cardiology (Cardiology)   HPI  Katherine Rosales is a 71 y.o. female Seen in followup for Medtronic  ICD implanted for nonischemic cardiomyopathy and primary prevention. She has chronic systolic failure. Generator replacement in 8/14.  10/21 acute heart failure associated with atrial fibrillation with a rapid rate.  Temporally related to discontinuing her CPAP  Now anticoagulated with Eliquis no bleeding  Seen 4/22 with increasing burden of nonsustained ventricular tachycardia.  Resumed her Aldactone 12.5 and transition her back from metoprolol--carvedilol.  In the interval started her on Entresto and she is considerably better.  High-frequency PVCs 12/22 >> amiodarone  Date PVCs  12/22 16%  2/23 4.9%   Patient denies symptoms of respiratory, GI intolerance, sun sensitivity attributable to amiodarone.  Mild tremor left hand       The patient denies chest pain, shortness of breath, nocturnal dyspnea, orthopnea or peripheral edema.  There have been no palpitations or syncope.  Complains of limited exercise tolerance.  Because concern for her is her renal function; thankfully they have able to navigate this with her diuresis.       Date Cr K BNP TSH LFTs Hgb  10/20 1.2 4.1    11.6  5/21 1.3 4.6    (2/21) 11.8   12/21 1.31 3.8    (10/21)12.5  4/22 1.28 3.8      12/22 1.7     14.7  05/07/21 2.41 4.1      05/15/21 2.04 3.4 875     05/19/21 1.99 3.7  8.15    3/23 1.81 4.0  1.62                DATE TEST EF%   6/14 Echo   20-25 %   9/20 Echo   30-35 %   5/22 Echo 20-25%       Past Medical History:  Diagnosis Date   6949 Lead-Medtronic    Anemia 11/12/2016   Anxiety 12/25/2013   Automatic implantable cardioverter-defibrillator in situ    Breast cancer ([place]) 03/1995   right breast ca with lumpectomy and chemo  and rad   Cardiomyopathy, nonischemic ([place])    a. Prior cath w/ nonobs CAD; b. s/p AICD w/ subsequent upgrade 11/2012-> MDT Evera XT VR single lead ICD (ser # [id number]); c. 12/2018 Echo: EF 30-35%; d. 08/2020 Echo: EF 20-25%, glob HK. Mod elev RVSP. Mod dil LA, mild to mod RAE. Mod MR/TR. Mild-mod AoV sclerosis.   Chronic HFrEF (heart failure with reduced ejection fraction) ([place])    a. 12/2018 Echo: EF 30-35%; b. 08/2020 Echo: EF 20-25%, glob HK.   CKD (chronic kidney disease), stage III ([place]) 11/12/2016   Depression    Diabetes mellitus    Type II Diabetes   Hypertension    Hypothyroidism    ICD (implantable cardiac defibrillator), singleMedtronic    NASH (nonalcoholic steatohepatitis)    OSA (obstructive sleep apnea)    a. On CPAP.   PAF (paroxysmal atrial fibrillation) ([place])    a. 09/2020 s/p DCCV.   Personal history of chemotherapy 1996   right breast ca   Personal history of radiation therapy 1996   right breast ca   PVC's (premature ventricular contractions)    a. 08/2020 Zio: 8.1% PVC burden.   Sleep apnea    On CPAP  Spleen enlarged    Systolic heart failure, chronic ([place])     Past Surgical History:  Procedure Laterality Date   ABDOMINAL HYSTERECTOMY     BREAST BIOPSY Right 11/26/2015   - FAT NECROSIS, FIBROSIS, AND CHRONIC INFLAMMATION.    BREAST BIOPSY Right 12/20/2018   Affirm Bx- X-clip- path pending   BREAST BIOPSY WITH RADIO FREQUENCY LOCALIZER Right 10/18/2019   Procedure: RF TAG GUIDED EXCISIONAL BREAST BIOPSY;  Surgeon: Benjamine Sprague, DO;  Location: [facility] ORS;  Service: General;  Laterality: Right;   BREAST EXCISIONAL BIOPSY Right 03/1995   + breast ca chemo and rad. 9:00 region   BREAST EXCISIONAL BIOPSY Right 2011   surgical bx neg  11:00 region   BREAST LUMPECTOMY Right 1996   CARDIOVERSION N/A 02/13/2020   Procedure: CARDIOVERSION;  Surgeon: Kate Sable, MD;  Location: [facility] ORS;  Service: Cardiovascular;  Laterality: N/A;   CARDIOVERSION N/A 09/29/2020    Procedure: CARDIOVERSION;  Surgeon: Wellington Hampshire, MD;  Location: [facility];  Service: Cardiovascular;  Laterality: N/A;   CARDIOVERSION N/A 05/08/2021   Procedure: CARDIOVERSION;  Surgeon: Minna Merritts, MD;  Location: [facility] ORS;  Service: Cardiovascular;  Laterality: N/A;   CHOLECYSTECTOMY     COLONOSCOPY  03/05/2013   COLONOSCOPY W/ POLYPECTOMY  03/05/2013   COLONOSCOPY WITH PROPOFOL N/A 10/03/2017   Procedure: COLONOSCOPY WITH PROPOFOL;  Surgeon: Manya Silvas, MD;  Location: [facility] ENDOSCOPY;  Service: Endoscopy;  Laterality: N/A;   ESOPHAGOGASTRODUODENOSCOPY  01/11/2003, 06/25/2015   ESOPHAGOGASTRODUODENOSCOPY (EGD) WITH PROPOFOL N/A 06/25/2015   Procedure: ESOPHAGOGASTRODUODENOSCOPY (EGD) WITH PROPOFOL;  Surgeon: Manya Silvas, MD;  Location: [facility] ENDOSCOPY;  Service: Endoscopy;  Laterality: N/A;   FOOT SURGERY Right    IMPLANTABLE CARDIOVERTER DEFIBRILLATOR (ICD) GENERATOR CHANGE N/A 11/15/2012   Procedure: ICD GENERATOR CHANGE;  Surgeon: Deboraha Sprang, MD;  Location: [facility] CATH LAB;  Service: Cardiovascular;  Laterality: N/A;   JOINT REPLACEMENT  2009   Total Knee Replacement, Left   LEAD REVISION N/A 11/15/2012   Procedure: LEAD REVISION;  Surgeon: Deboraha Sprang, MD;  Location: [facility] CATH LAB;  Service: Cardiovascular;  Laterality: N/A;   PACEMAKER INSERTION  06/30/2005   Medtronic Virtuoso VR ICD - Nonischemic cardiomyopathy potentially related to adriamycin, class 3 congestive failture with a narrow QRS and a negative TDI   TONSILLECTOMY     TUBAL LIGATION      Current Outpatient Medications  Medication Sig Dispense Refill   amiodarone (PACERONE) 200 MG tablet TAKE 1 TABLET BY MOUTH ONCE DAILY. 30 tablet 2   apixaban (ELIQUIS) 5 MG TABS tablet TAKE 1 TABLET BY MOUTH TWICE DAILY 60 tablet 6   Ascorbic Acid (VITAMIN C) 1000 MG tablet Take 1,000 mg by mouth in the morning.     Calcium Carb-Cholecalciferol (CALCIUM 600 + D PO) Take 1 tablet by mouth in the morning.      carvedilol (COREG) 3.125 MG tablet TAKE 1 TABLET BY MOUTH TWICE DAILY 60 tablet 1   CINNAMON PO Take 1,000 mg by mouth in the morning.     denosumab (PROLIA) 60 MG/ML SOSY injection Inject 60 mg into the skin every 6 (six) months.     docusate sodium (COLACE) 100 MG capsule Take 300 mg by mouth at bedtime.      DULoxetine (CYMBALTA) 60 MG capsule Take 60 mg by mouth daily.     empagliflozin (JARDIANCE) 25 MG TABS tablet Take 25 mg by mouth in the morning.     gabapentin (NEURONTIN) 600 MG  tablet Take 600 mg by mouth 2 (two) times daily.     glimepiride (AMARYL) 2 MG tablet Take 1 tablet (2 mg) by mouth twice daily     levothyroxine (SYNTHROID) 125 MCG tablet Take 1 tablet (125 mcg total) by mouth daily before breakfast. 30 tablet 6   Multiple Vitamin (MULTIVITAMIN WITH MINERALS) TABS tablet Take 1 tablet by mouth daily. Centrum Silver     Multiple Vitamins-Minerals (OCUVITE PO) Take 1 tablet by mouth in the morning.     omeprazole (PRILOSEC) 20 MG capsule Take 20 mg by mouth in the morning.     sacubitril-valsartan (ENTRESTO) 24-26 MG Take 1 tablet by mouth 2 (two) times daily. 180 tablet 3   spironolactone (ALDACTONE) 25 MG tablet TAKE (1/2) TABLET BY MOUTH ONCE DAILY. 15 tablet 1   torsemide (DEMADEX) 20 MG tablet TAKE (3) TABLETS BY MOUTH ONCE DAILY. 90 tablet 0   No current facility-administered medications for this visit.   Allergies  Allergen Reactions   Ibuprofen Other (See Comments)    Avoid due to stage 3 kidney disease     Review of Systems negative except from HPI and PMH  Physical Exam: BP 108/60 (BP Location: Left Arm, Patient Position: Sitting, Cuff Size: Large)   Pulse 68   Ht 4' 11"  (1.499 m)   Wt 214 lb (97.1 kg)   SpO2 99%   BMI 43.22 kg/m  Well developed and well nourished in no acute distress HENT normal Neck supple with JVP-flat/upright Clear Device pocket well healed; without hematoma or erythema.  There is no tethering  Regular rate and rhythm, no  gallop  No murmur Abd-soft with active BS No Clubbing cyanosis  edema Skin-warm and dry A & Oriented  Grossly normal sensory and motor function  ECG sinus rhythm at 68 Intervals 21/15/48  Assessment and  Plan  Congestive heart failure-chronic    Nonischemic cardiomyopathy  Hypertension   PVCs reduced  RBBB superior axis  Implantable defibrillator-Medtronic        Renal insufficiency grade 3 - 4(creatinine 1.7 GFR 46. CG and 31 CKD-EPI (for obesity)  Sleep apnea  -- compliant  Atrial fibrillation-persistent holding sinus rhythm//Freq atrial ectopy and atrial tachycardia   Tremor left hand   Euvolemic.  Continue her on her torsemide at 60 mg daily.  We will check a metabolic profile today.  PVC burden seems significantly reduced based on histograms.  We will continue her on 200 mg amiodarone daily but would anticipate in 6 months to reduce it to 5 days a week-- last TSH had normalized.  No bleeding on her Eliquis.  Continue at 5 mg twice daily.  With her cardiomyopathy, we will continue her Entresto 24/26 and spironolactone 12.5 as well as carvedilol 3.125.

## 2021-09-15 ENCOUNTER — Telehealth: Payer: Self-pay | Admitting: Internal Medicine

## 2021-09-15 NOTE — Telephone Encounter (Signed)
Patient calling to speak with Nira Conn about her lab results and some paperwork she filled out.

## 2021-09-17 DIAGNOSIS — D2262 Melanocytic nevi of left upper limb, including shoulder: Secondary | ICD-10-CM | POA: Diagnosis not present

## 2021-09-17 DIAGNOSIS — D2272 Melanocytic nevi of left lower limb, including hip: Secondary | ICD-10-CM | POA: Diagnosis not present

## 2021-09-17 DIAGNOSIS — D225 Melanocytic nevi of trunk: Secondary | ICD-10-CM | POA: Diagnosis not present

## 2021-09-17 DIAGNOSIS — R202 Paresthesia of skin: Secondary | ICD-10-CM | POA: Diagnosis not present

## 2021-09-17 DIAGNOSIS — D2261 Melanocytic nevi of right upper limb, including shoulder: Secondary | ICD-10-CM | POA: Diagnosis not present

## 2021-09-17 DIAGNOSIS — L538 Other specified erythematous conditions: Secondary | ICD-10-CM | POA: Diagnosis not present

## 2021-09-17 DIAGNOSIS — L820 Inflamed seborrheic keratosis: Secondary | ICD-10-CM | POA: Diagnosis not present

## 2021-09-17 DIAGNOSIS — L821 Other seborrheic keratosis: Secondary | ICD-10-CM | POA: Diagnosis not present

## 2021-09-17 DIAGNOSIS — D2271 Melanocytic nevi of right lower limb, including hip: Secondary | ICD-10-CM | POA: Diagnosis not present

## 2021-09-17 NOTE — Telephone Encounter (Signed)
-    Patient came to office to check on status of results from 6/6    Patient worried about kidney function and would like a call asap to discuss .    Patient states she had trouble with call center times and wasn't sure if msg was received.    - Assisted patient to sign up for mychart log in and walked through how to review results and send msgs there as an alternative.   Patient also dropped off patient assistance forms insurance card income verification and pharmacy expenses .

## 2021-09-17 NOTE — Telephone Encounter (Signed)
Reviewed preliminary results and advised that she may get another call from [facility] next week. She was very appreciative with no further questions at this time.

## 2021-09-21 DIAGNOSIS — E1122 Type 2 diabetes mellitus with diabetic chronic kidney disease: Secondary | ICD-10-CM | POA: Diagnosis not present

## 2021-09-21 DIAGNOSIS — M810 Age-related osteoporosis without current pathological fracture: Secondary | ICD-10-CM | POA: Diagnosis not present

## 2021-09-21 DIAGNOSIS — N184 Chronic kidney disease, stage 4 (severe): Secondary | ICD-10-CM | POA: Diagnosis not present

## 2021-09-21 DIAGNOSIS — N1832 Chronic kidney disease, stage 3b: Secondary | ICD-10-CM | POA: Diagnosis not present

## 2021-09-21 DIAGNOSIS — N2581 Secondary hyperparathyroidism of renal origin: Secondary | ICD-10-CM | POA: Diagnosis not present

## 2021-09-21 DIAGNOSIS — E1143 Type 2 diabetes mellitus with diabetic autonomic (poly)neuropathy: Secondary | ICD-10-CM | POA: Diagnosis not present

## 2021-09-21 NOTE — Telephone Encounter (Signed)
Paperwork obtained from nurse bin.  I have faxed the patient's completed applications and documentation to:  1) [facility]) Fax #[phone] Confirmation received   2) [place] ([employer]) Fax #- [phone] Confirmation received    All completed paperwork has been placed in the file for completed assistance forms.   I have also reviewed the patient's lab work- BMP done on 09/08/21. Her renal function is fairly stable for her.  Await MD sign off and final recommendations.

## 2021-09-23 ENCOUNTER — Telehealth: Payer: Self-pay | Admitting: Internal Medicine

## 2021-09-23 NOTE — Telephone Encounter (Signed)
Fax received from [employer]) stating they received the patient's application for assistance.  However, the patient did not sign/ date her application.  I have pulled her paperwork and confirmed she did not sign/ date the 3 pages that require her signature. I have called and notified the patient of the above. She asked that I fax these to [facility] in [place] so she may sign them and have the pharmacy fax these back to me for her.  3 pages of the application faxed to [facility] at [phone]. Confirmation received.  Will await completed application and then resubmit to [employer].

## 2021-09-24 NOTE — Telephone Encounter (Signed)
I called and spoke with the patient. I advised her:  1) I had received her signed application by fax for [name] and have resubmitted this for her today- fax confirmation received  2) I received a fax notification from [employer] that she has been approved for Eliquis patient assistance from 09/23/21-04/04/22. - the patient advised she had been contacted by a [employer] rep and they are to deliver her 1st shipment of eliquis tomorrow.  The patient voices understanding of the above and was appreciative of the call back.

## 2021-09-28 ENCOUNTER — Telehealth: Payer: Self-pay | Admitting: Internal Medicine

## 2021-10-02 ENCOUNTER — Telehealth: Payer: Self-pay | Admitting: Internal Medicine

## 2021-10-02 ENCOUNTER — Other Ambulatory Visit: Payer: Self-pay | Admitting: Internal Medicine

## 2021-10-02 NOTE — Telephone Encounter (Signed)
Patient is calling requesting a callback from [facility] requesting the phone number for the [employer] patient assistance phone number.

## 2021-10-02 NOTE — Telephone Encounter (Signed)
I spoke with the patient. She states she has not heard anything from [place] Patient Assistance in regards to her shipment of medication. I have given her the # to call [employer] Patient Assistance at [phone]. Hours are M-F 8:30 am- 6:00 pm (EST).   The patient then advised she has felt "under the weather. Her weight was 214 lbs earlier this week and she weighed again today she was 207 lbs.   She had no appetite earlier this week, but is feeling some better today. I have advised her to please continue to monitor her symptoms/ weight and call back sooner if needed.   The patient voices understanding and is agreeable.

## 2021-10-12 ENCOUNTER — Ambulatory Visit (INDEPENDENT_AMBULATORY_CARE_PROVIDER_SITE_OTHER): Payer: Medicare HMO

## 2021-10-12 DIAGNOSIS — I5022 Chronic systolic (congestive) heart failure: Secondary | ICD-10-CM | POA: Diagnosis not present

## 2021-10-12 DIAGNOSIS — Z95810 Presence of automatic (implantable) cardiac defibrillator: Secondary | ICD-10-CM

## 2021-10-15 NOTE — Progress Notes (Signed)
EPIC Encounter for ICM Monitoring  Patient Name: Katherine Rosales is a 71 y.o. female Date: 10/15/2021 Primary Care Physican: Dion Body, MD Primary Cardiologist: [name] Electrophysiologist: [name] 07/03/2021 Weight: 203 lbs 09/08/2021 Office Weight: 214 lbs          Spoke with patient and heart failure questions reviewed.  Pt asymptomatic for fluid accumulation.  Reports feeling well at this time and voices no complaints.     Optivol thoracic impedance suggesting normal fluid levels.   Prescribed:  Torsemide 20 mg Take 3 tablets (60 mg) once daily. Daily dosage is in blister pack.   Spironolactone 25 mg take 0.5 tablet (12.5 mg total) daily.   Labs: 09/08/2021 Creatinine 1.91, BUN 28, Potassium 4.1, Sodium 138, GFR 28 06/23/2021 Creatinine 1.9,   BUN 31, Potassium 3.9, Sodium 147, GFR 26 06/16/2021 Creatinine 1.9,   BUN 25, Potassium 4.1, Sodium 141, GFR 26  06/09/2021 Creatinine 1.81, BUN 27, Potassium 4.0, Sodium 141, GFR 30 05/19/2021 Creatinine 1.99, BUN 25, Potassium 3.7, Sodium 141 05/15/2021 Creatinine 2.04, BUN 29, Potassium 3.4, Sodium 139, GFR 26 05/07/2021 Creatinine 2.41, BUN 36, Potassium 4.1, Sodium 140, GFR 21 A complete set of results can be found in Results Review.   Recommendations:  No changes and encouraged to call if experiencing any fluid symptoms.   Follow-up plan: ICM clinic phone appointment on 11/16/2021.   91 day device clinic remote transmission 11/09/2021.    EP/Cardiology Office Visits: 03/16/2022 with Dr [name].   Copy of ICM check sent to Dr. [name].   3 month ICM trend: 10/12/2021.    12-14 Month ICM trend:     [name], RN 10/15/2021 10:41 AM

## 2021-10-19 ENCOUNTER — Encounter: Payer: Self-pay | Admitting: Internal Medicine

## 2021-10-22 ENCOUNTER — Other Ambulatory Visit: Payer: Self-pay | Admitting: Family Medicine

## 2021-10-22 DIAGNOSIS — Z1231 Encounter for screening mammogram for malignant neoplasm of breast: Secondary | ICD-10-CM

## 2021-10-30 ENCOUNTER — Other Ambulatory Visit: Payer: Self-pay | Admitting: Internal Medicine

## 2021-11-09 ENCOUNTER — Ambulatory Visit (INDEPENDENT_AMBULATORY_CARE_PROVIDER_SITE_OTHER): Payer: Medicare HMO

## 2021-11-09 DIAGNOSIS — I5022 Chronic systolic (congestive) heart failure: Secondary | ICD-10-CM

## 2021-11-09 DIAGNOSIS — I428 Other cardiomyopathies: Secondary | ICD-10-CM | POA: Diagnosis not present

## 2021-11-10 LAB — CUP PACEART REMOTE DEVICE CHECK
Battery Remaining Longevity: 32 mo
Battery Voltage: 2.94 V
Brady Statistic RV Percent Paced: 0.11 %
Date Time Interrogation Session: 20230807043823
HighPow Impedance: 90 Ohm
Implantable Lead Implant Date: 20140813
Implantable Lead Location: 753860
Implantable Lead Model: 6935
Implantable Pulse Generator Implant Date: 20140813
Lead Channel Impedance Value: 323 Ohm
Lead Channel Impedance Value: 380 Ohm
Lead Channel Pacing Threshold Amplitude: 0.875 V
Lead Channel Pacing Threshold Pulse Width: 0.4 ms
Lead Channel Sensing Intrinsic Amplitude: 14 mV
Lead Channel Sensing Intrinsic Amplitude: 14 mV
Lead Channel Setting Pacing Amplitude: 2 V
Lead Channel Setting Pacing Pulse Width: 0.4 ms
Lead Channel Setting Sensing Sensitivity: 0.3 mV

## 2021-11-14 IMAGING — MG MM DIGITAL SCREENING BILAT W/ TOMO AND CAD
6 of 12 series · 6 of 36 positions shown · non-contrast
Comparison: Previous exam(s).

CLINICAL DATA: Screening.

EXAM:
DIGITAL SCREENING BILATERAL MAMMOGRAM WITH TOMOSYNTHESIS AND CAD
TECHNIQUE: Bilateral screening digital craniocaudal and mediolateral oblique
mammograms were obtained. Bilateral screening digital breast
tomosynthesis was performed. The images were evaluated with
computer-aided detection.

[L CC synth-2D]
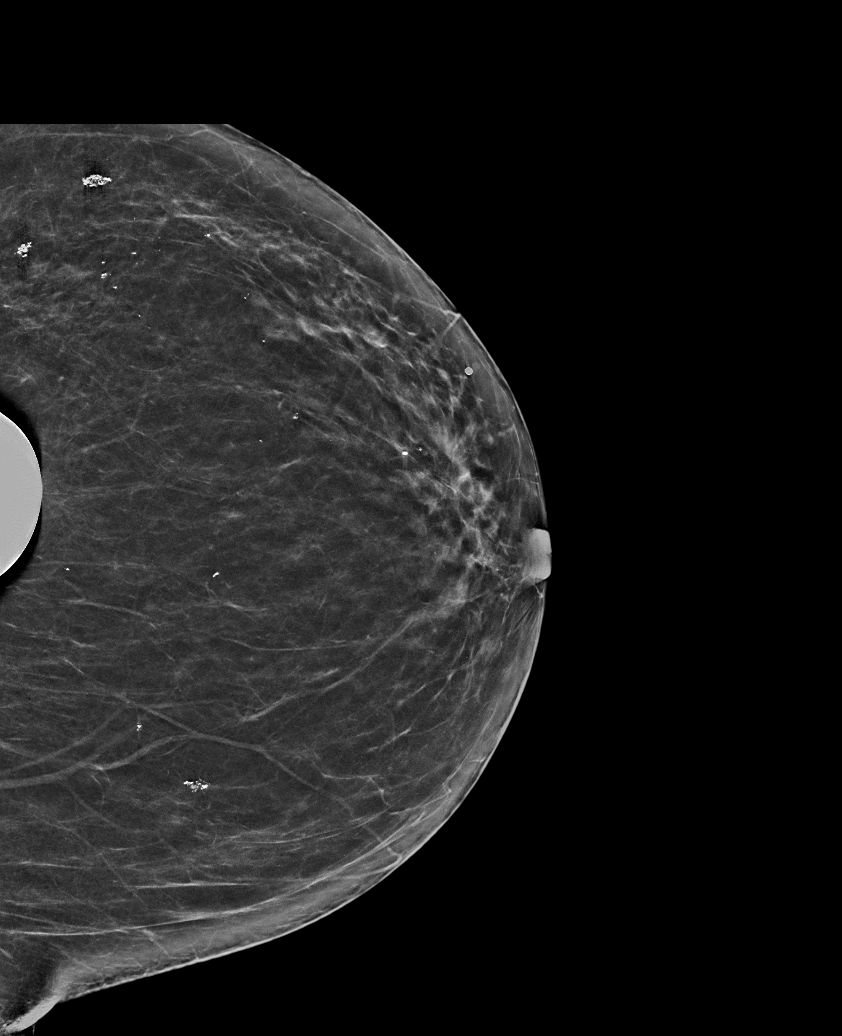

[R CC synth-2D]
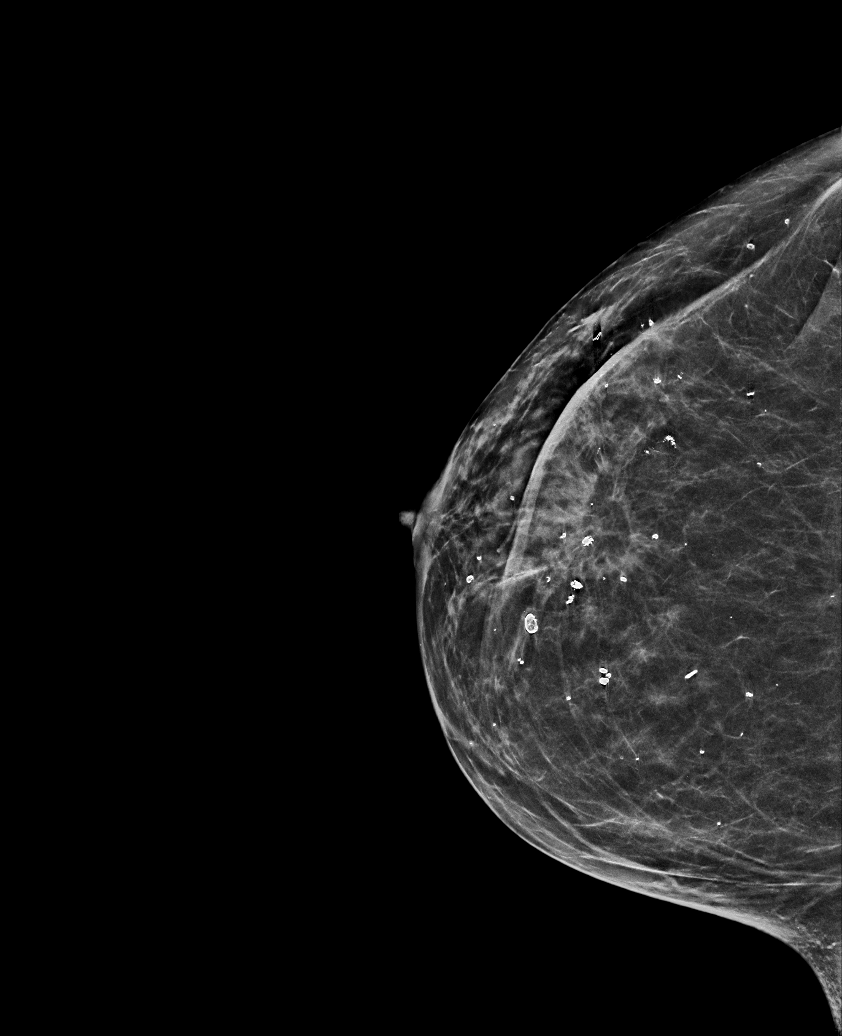

[R MLO synth-2D (1 of 2)]
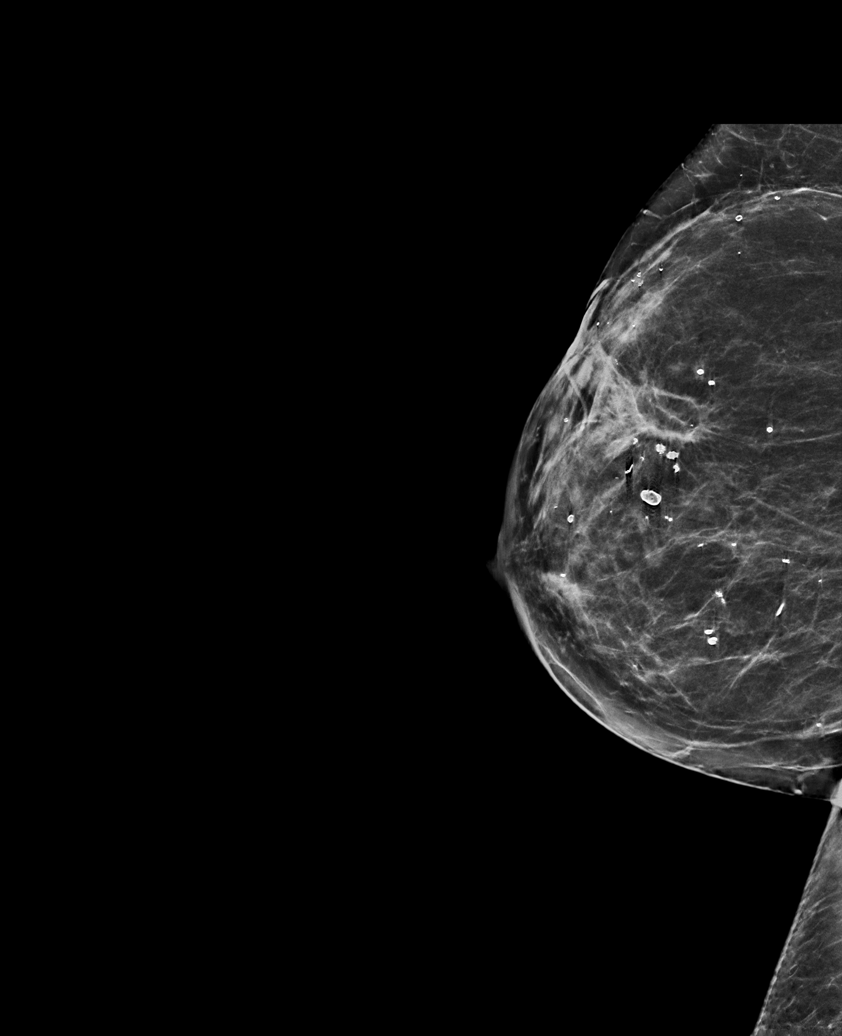

[L MLO synth-2D (1 of 2)]
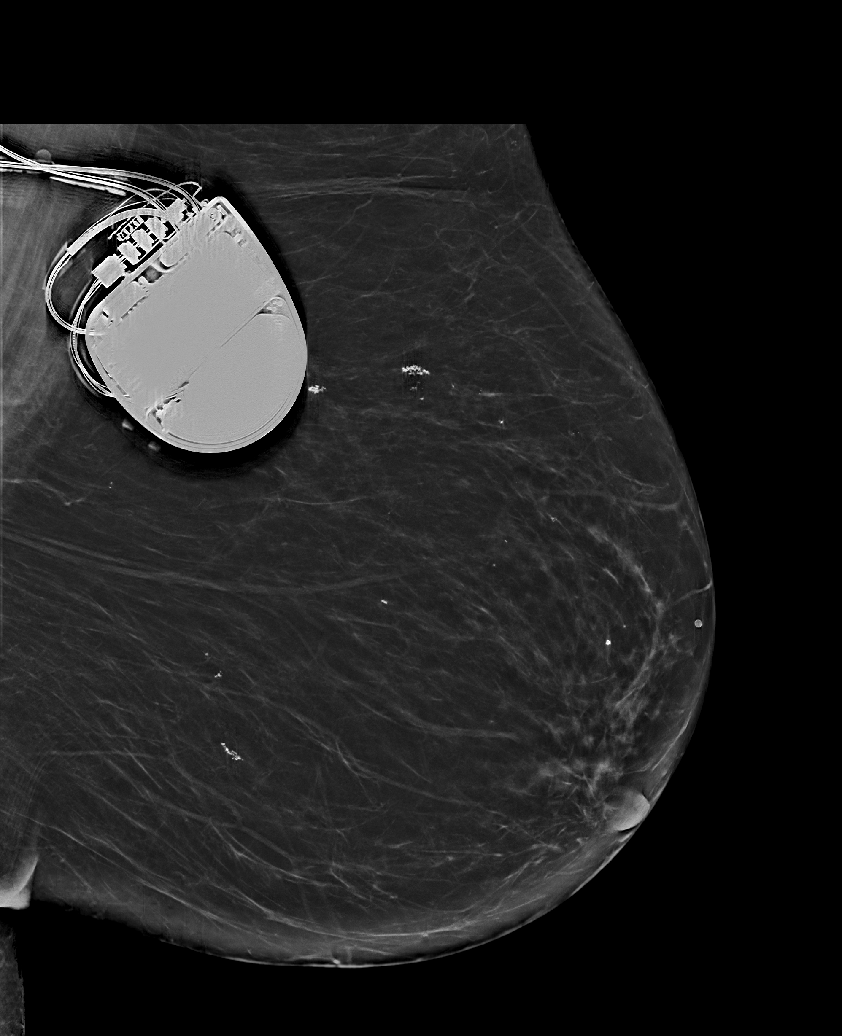

[R MLO synth-2D (2 of 2)]
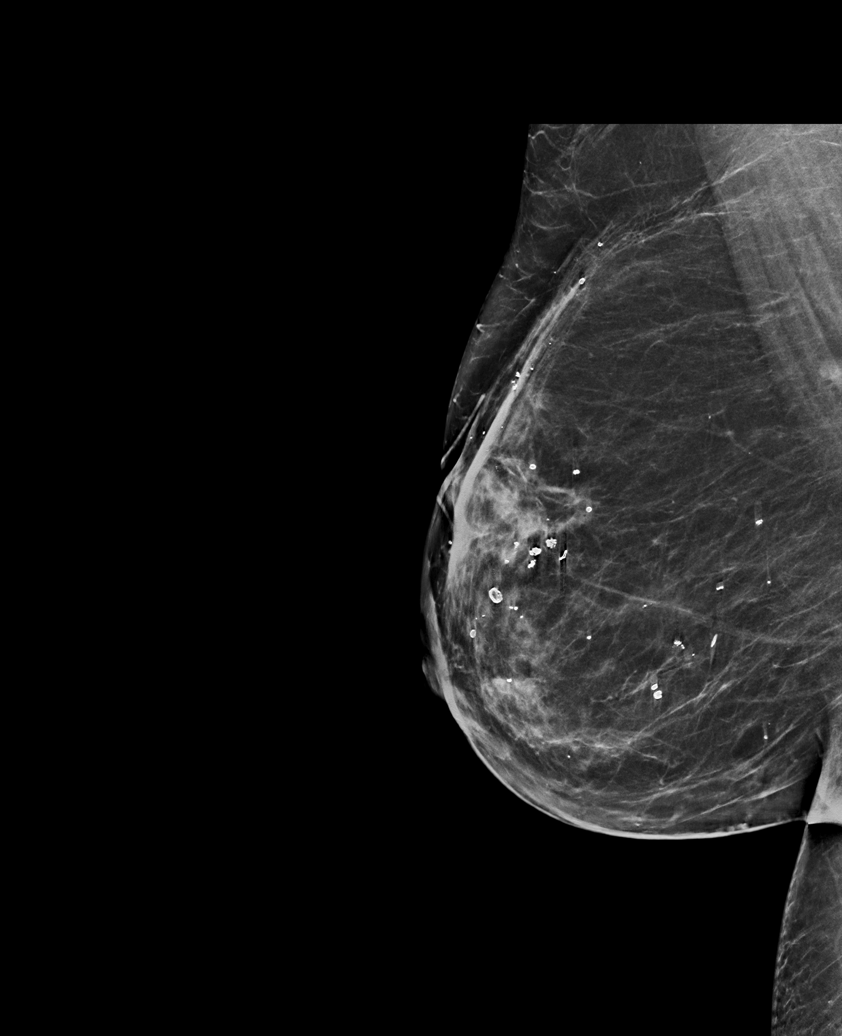

[L MLO synth-2D (2 of 2)]
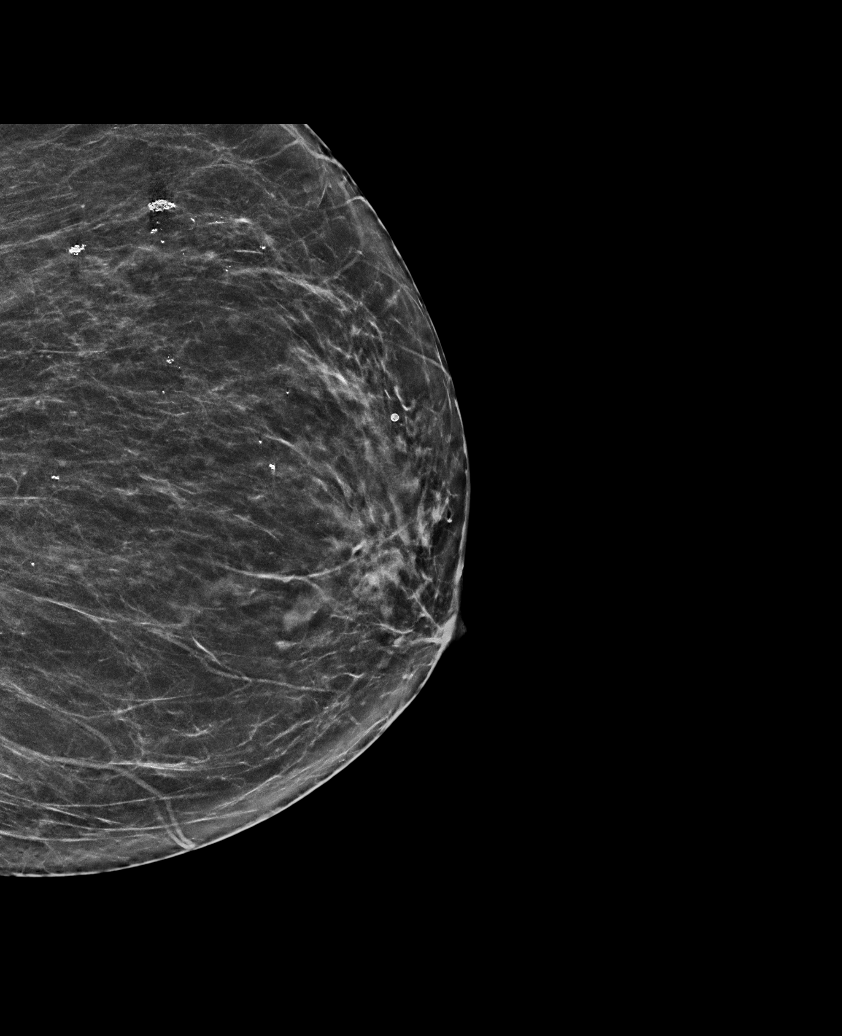

[6 of 36 positions shown; findings below may reference images not displayed]

ACR Breast Density Category b: There are scattered areas of
fibroglandular density.
FINDINGS: Postoperative changes in the upper central right breast from
interval benign surgical excisional biopsy. There are no findings
suspicious for malignancy. Left chest wall pacemaker overlies
posterior left upper breast and pectoralis major muscle.
IMPRESSION: Interval benign surgical excisional biopsy with expected
postoperative changes in central upper right breast. No mammographic
evidence of malignancy in either breast. A result letter of this
screening mammogram will be mailed directly to the patient.

RECOMMENDATION:
Screening mammogram in one year. (Code:YX-2-QOX)

BI-RADS CATEGORY  2: Benign

## 2021-11-19 NOTE — Progress Notes (Signed)
No ICM remote transmission received for 11/16/2021 and next ICM transmission scheduled for 11/30/2021.

## 2021-11-24 ENCOUNTER — Other Ambulatory Visit: Payer: Self-pay | Admitting: Gastroenterology

## 2021-11-24 ENCOUNTER — Ambulatory Visit
Admission: RE | Admit: 2021-11-24 | Discharge: 2021-11-24 | Disposition: A | Discharge status: Home / Self Care | Payer: Medicare HMO | Source: Ambulatory Visit | Attending: Family Medicine | Admitting: Family Medicine

## 2021-11-24 ENCOUNTER — Other Ambulatory Visit (HOSPITAL_COMMUNITY): Payer: Self-pay | Admitting: Gastroenterology

## 2021-11-24 DIAGNOSIS — K7460 Unspecified cirrhosis of liver: Secondary | ICD-10-CM | POA: Diagnosis not present

## 2021-11-24 DIAGNOSIS — Z1231 Encounter for screening mammogram for malignant neoplasm of breast: Secondary | ICD-10-CM | POA: Insufficient documentation

## 2021-11-25 DIAGNOSIS — E1122 Type 2 diabetes mellitus with diabetic chronic kidney disease: Secondary | ICD-10-CM | POA: Diagnosis not present

## 2021-11-25 DIAGNOSIS — N184 Chronic kidney disease, stage 4 (severe): Secondary | ICD-10-CM | POA: Diagnosis not present

## 2021-11-25 DIAGNOSIS — N2581 Secondary hyperparathyroidism of renal origin: Secondary | ICD-10-CM | POA: Diagnosis not present

## 2021-11-30 ENCOUNTER — Ambulatory Visit (INDEPENDENT_AMBULATORY_CARE_PROVIDER_SITE_OTHER): Payer: Medicare HMO

## 2021-11-30 DIAGNOSIS — I5022 Chronic systolic (congestive) heart failure: Secondary | ICD-10-CM

## 2021-11-30 DIAGNOSIS — E1122 Type 2 diabetes mellitus with diabetic chronic kidney disease: Secondary | ICD-10-CM | POA: Diagnosis not present

## 2021-11-30 DIAGNOSIS — N2581 Secondary hyperparathyroidism of renal origin: Secondary | ICD-10-CM | POA: Diagnosis not present

## 2021-11-30 DIAGNOSIS — N184 Chronic kidney disease, stage 4 (severe): Secondary | ICD-10-CM | POA: Diagnosis not present

## 2021-11-30 DIAGNOSIS — Z95810 Presence of automatic (implantable) cardiac defibrillator: Secondary | ICD-10-CM

## 2021-12-01 NOTE — Progress Notes (Unsigned)
EPIC Encounter for ICM Monitoring  Patient Name: Katherine Rosales is a 71 y.o. female Date: 12/01/2021 Primary Care Physican: Dion Body, MD Primary Cardiologist: [name] Electrophysiologist: [name] 07/03/2021 Weight: 203 lbs 12/01/2021 Weight: 215 lbs          Spoke with patient and heart failure questions reviewed.  Pt reports SOB and weight increase of 4 lbs in the last week.  Confirmed she is taking Torsemide 60 mg daily.  She has been eating foods that are not considered low salt.    Optivol thoracic impedance suggesting possible fluid accumulation starting 7/24.   Prescribed:  Torsemide 20 mg Take 3 tablets (60 mg) once daily. Daily dosage is in blister pack.   Spironolactone 25 mg take 0.5 tablet (12.5 mg total) daily.   Labs: 09/08/2021 Creatinine 1.91, BUN 28, Potassium 4.1, Sodium 138, GFR 28 06/23/2021 Creatinine 1.9,   BUN 31, Potassium 3.9, Sodium 147, GFR 26 06/16/2021 Creatinine 1.9,   BUN 25, Potassium 4.1, Sodium 141, GFR 26  06/09/2021 Creatinine 1.81, BUN 27, Potassium 4.0, Sodium 141, GFR 30 05/19/2021 Creatinine 1.99, BUN 25, Potassium 3.7, Sodium 141 05/15/2021 Creatinine 2.04, BUN 29, Potassium 3.4, Sodium 139, GFR 26 05/07/2021 Creatinine 2.41, BUN 36, Potassium 4.1, Sodium 140, GFR 21 A complete set of results can be found in Results Review.   Recommendations:  Copy sent to Dr [name] for review and recommendations if needed.     Follow-up plan: ICM clinic phone appointment on 12/08/2021 to recheck fluid levels.   91 day device clinic remote transmission 02/08/2022.    EP/Cardiology Office Visits: 03/16/2022 with Dr [name].   Copy of ICM check sent to Dr. [name].   3 month ICM trend: 11/30/2021.    12-14 Month ICM trend:     [name], RN 12/01/2021 12:11 PM

## 2021-12-03 ENCOUNTER — Other Ambulatory Visit: Payer: Self-pay

## 2021-12-03 ENCOUNTER — Ambulatory Visit
Admission: RE | Admit: 2021-12-03 | Discharge: 2021-12-03 | Disposition: A | Discharge status: Home / Self Care | Payer: Medicare HMO | Source: Ambulatory Visit | Attending: Gastroenterology | Admitting: Gastroenterology

## 2021-12-03 DIAGNOSIS — K7460 Unspecified cirrhosis of liver: Secondary | ICD-10-CM | POA: Insufficient documentation

## 2021-12-03 MED ORDER — AMIODARONE HCL 200 MG PO TABS: 200.0000 mg | ORAL_TABLET | Freq: Every day | ORAL | 2 refills | Status: AC

## 2021-12-04 MED ORDER — TORSEMIDE 20 MG PO TABS: ORAL_TABLET | ORAL | 1 refills | Status: DC

## 2021-12-04 NOTE — Addendum Note (Signed)
Addended by: Alvis Lemmings C on: 12/04/2021 03:09 PM   Modules accepted: Orders

## 2021-12-04 NOTE — Progress Notes (Addendum)
ICM results reviewed with Dr. [name].   Orders received to: 1) take torsemide 20 mg: 3 tablets (60 mg) BID x 3 days, then resume 3 tablets (60 mg) once daily   I called and spoke with the patient. She advised that her weight is currently 212 lbs today, with some continued SOB. I have advised the patient that she may still benefit from taking torsemide 60 mg BID x 3 days, then resuming 60 mg once daily.  She voices understanding and is agreeable.  She is aware I will reach out to [facility] as her medications are blister packed. She will check with the pharmacy a little later today to see if this RX is ready.  She was very appreciative of the call back.

## 2021-12-08 ENCOUNTER — Ambulatory Visit (INDEPENDENT_AMBULATORY_CARE_PROVIDER_SITE_OTHER): Payer: Medicare HMO

## 2021-12-08 DIAGNOSIS — I5022 Chronic systolic (congestive) heart failure: Secondary | ICD-10-CM

## 2021-12-08 DIAGNOSIS — Z95810 Presence of automatic (implantable) cardiac defibrillator: Secondary | ICD-10-CM

## 2021-12-08 NOTE — Progress Notes (Unsigned)
EPIC Encounter for ICM Monitoring  Patient Name: Katherine Rosales is a 71 y.o. female Date: 12/08/2021 Primary Care Physican: Dion Body, MD Primary Cardiologist: [name] Electrophysiologist: [name] 07/03/2021 Weight: 203 lbs 12/01/2021 Weight: 215 lbs 12/08/2021 Weight: 207 lbs          Spoke with patient and heart failure questions reviewed.  After taking 2 days of Torsemide 60 mg bid, pt's SOB has resolved and weight has returned close to baseline (decreased by 8 lbs)   Optivol thoracic impedance suggesting fluid levels returned to normal after taking Torsemide 60 mg bid x 2 days of 3 prescribed.   Prescribed:  Torsemide 20 mg Take 3 tablets (60 mg) once daily. Daily dosage is in blister pack.   Spironolactone 25 mg take 0.5 tablet (12.5 mg total) daily.   Labs: 09/08/2021 Creatinine 1.91, BUN 28, Potassium 4.1, Sodium 138, GFR 28 06/23/2021 Creatinine 1.9,   BUN 31, Potassium 3.9, Sodium 147, GFR 26 06/16/2021 Creatinine 1.9,   BUN 25, Potassium 4.1, Sodium 141, GFR 26  06/09/2021 Creatinine 1.81, BUN 27, Potassium 4.0, Sodium 141, GFR 30 05/19/2021 Creatinine 1.99, BUN 25, Potassium 3.7, Sodium 141 05/15/2021 Creatinine 2.04, BUN 29, Potassium 3.4, Sodium 139, GFR 26 05/07/2021 Creatinine 2.41, BUN 36, Potassium 4.1, Sodium 140, GFR 21 A complete set of results can be found in Results Review.   Recommendations:  Pt completed 2 days of the 3 to take Torsemide 60 mg bid and tomorrow (9/6) will day 3.   Report sent to Dr [name] and Alvis Lemmings, RN for review.     Follow-up plan: ICM clinic phone appointment on 12/21/2021 to recheck stability of fluid levels.   91 day device clinic remote transmission 02/08/2022.    EP/Cardiology Office Visits: 03/16/2022 with Dr [name].   Copy of ICM check sent to Dr. [name].   3 month ICM trend: 12/08/2021.    12-14 Month ICM trend:     [name], RN 12/08/2021 9:21 AM

## 2021-12-09 DIAGNOSIS — E1143 Type 2 diabetes mellitus with diabetic autonomic (poly)neuropathy: Secondary | ICD-10-CM | POA: Diagnosis not present

## 2021-12-09 NOTE — Progress Notes (Signed)
Spoke with patient and asked if she has taken 2nd Torsemide dosage today and she stated yes.  Advised after today go back to prescribed dosage.  Will recheck fluid levels on 9/18.

## 2021-12-10 ENCOUNTER — Other Ambulatory Visit: Payer: Self-pay

## 2021-12-10 MED ORDER — ENTRESTO 24-26 MG PO TABS: 1.0000 | ORAL_TABLET | Freq: Two times a day (BID) | ORAL | 0 refills | Status: DC

## 2021-12-16 NOTE — Progress Notes (Signed)
Remote ICD transmission.   

## 2021-12-21 ENCOUNTER — Ambulatory Visit (INDEPENDENT_AMBULATORY_CARE_PROVIDER_SITE_OTHER): Payer: Medicare HMO

## 2021-12-21 DIAGNOSIS — I5022 Chronic systolic (congestive) heart failure: Secondary | ICD-10-CM

## 2021-12-21 DIAGNOSIS — E1143 Type 2 diabetes mellitus with diabetic autonomic (poly)neuropathy: Secondary | ICD-10-CM | POA: Diagnosis not present

## 2021-12-21 DIAGNOSIS — Z95810 Presence of automatic (implantable) cardiac defibrillator: Secondary | ICD-10-CM

## 2021-12-21 DIAGNOSIS — E039 Hypothyroidism, unspecified: Secondary | ICD-10-CM | POA: Diagnosis not present

## 2021-12-21 DIAGNOSIS — N184 Chronic kidney disease, stage 4 (severe): Secondary | ICD-10-CM | POA: Diagnosis not present

## 2021-12-24 ENCOUNTER — Telehealth: Payer: Self-pay

## 2021-12-24 NOTE — Telephone Encounter (Signed)
I spoke with the patient and she agreed to send a transmission later today. She has not been home.

## 2021-12-25 ENCOUNTER — Telehealth: Payer: Self-pay

## 2021-12-25 NOTE — Telephone Encounter (Signed)
Remote ICM transmission received.  Attempted call to patient regarding ICM remote transmission and mail box is full.

## 2021-12-25 NOTE — Progress Notes (Addendum)
Returned patient call as requested by voice mail message and mailbox is full.

## 2021-12-25 NOTE — Progress Notes (Signed)
EPIC Encounter for ICM Monitoring  Patient Name: Katherine Rosales is a 71 y.o. female Date: 12/25/2021 Primary Care Physican: Dion Body, MD Primary Cardiologist: [name] Electrophysiologist: [name] 07/03/2021 Weight: 203 lbs 12/01/2021 Weight: 215 lbs 12/08/2021 Weight: 207 lbs          Attempted call to patient and unable to reach, mailbox is full.  Transmission reviewed.    Optivol thoracic impedance suggesting possible fluid accumulation starting 9/7.  Unable to maintain normal fluid levels since 7/12.   Prescribed:  Torsemide 20 mg Take 3 tablets (60 mg) once daily. Daily dosage is in blister pack.   Spironolactone 25 mg take 0.5 tablet (12.5 mg total) daily.   Labs: 09/08/2021 Creatinine 1.91, BUN 28, Potassium 4.1, Sodium 138, GFR 28 06/23/2021 Creatinine 1.9,   BUN 31, Potassium 3.9, Sodium 147, GFR 26 06/16/2021 Creatinine 1.9,   BUN 25, Potassium 4.1, Sodium 141, GFR 26  06/09/2021 Creatinine 1.81, BUN 27, Potassium 4.0, Sodium 141, GFR 30 05/19/2021 Creatinine 1.99, BUN 25, Potassium 3.7, Sodium 141 05/15/2021 Creatinine 2.04, BUN 29, Potassium 3.4, Sodium 139, GFR 26 05/07/2021 Creatinine 2.41, BUN 36, Potassium 4.1, Sodium 140, GFR 21 A complete set of results can be found in Results Review.   Recommendations:  Unable to reach.     Follow-up plan: ICM clinic phone appointment on 01/04/2022.   91 day device clinic remote transmission 02/08/2022.    EP/Cardiology Office Visits: 03/16/2022 with Dr [name].   Copy of ICM check sent to Dr. [name].    3 month ICM trend: 12/24/2021.    12-14 Month ICM trend:     [name], RN 12/25/2021 10:55 AM

## 2021-12-29 DIAGNOSIS — Z6841 Body Mass Index (BMI) 40.0 and over, adult: Secondary | ICD-10-CM | POA: Diagnosis not present

## 2021-12-29 DIAGNOSIS — I5022 Chronic systolic (congestive) heart failure: Secondary | ICD-10-CM | POA: Diagnosis not present

## 2021-12-29 DIAGNOSIS — E1143 Type 2 diabetes mellitus with diabetic autonomic (poly)neuropathy: Secondary | ICD-10-CM | POA: Diagnosis not present

## 2021-12-29 DIAGNOSIS — I4819 Other persistent atrial fibrillation: Secondary | ICD-10-CM | POA: Diagnosis not present

## 2021-12-29 DIAGNOSIS — E039 Hypothyroidism, unspecified: Secondary | ICD-10-CM | POA: Diagnosis not present

## 2021-12-29 DIAGNOSIS — E1122 Type 2 diabetes mellitus with diabetic chronic kidney disease: Secondary | ICD-10-CM | POA: Diagnosis not present

## 2021-12-29 DIAGNOSIS — M2578 Osteophyte, vertebrae: Secondary | ICD-10-CM | POA: Diagnosis not present

## 2021-12-29 DIAGNOSIS — M5137 Other intervertebral disc degeneration, lumbosacral region: Secondary | ICD-10-CM | POA: Diagnosis not present

## 2021-12-29 DIAGNOSIS — M545 Low back pain, unspecified: Secondary | ICD-10-CM | POA: Diagnosis not present

## 2021-12-29 DIAGNOSIS — M5136 Other intervertebral disc degeneration, lumbar region: Secondary | ICD-10-CM | POA: Diagnosis not present

## 2021-12-29 DIAGNOSIS — N184 Chronic kidney disease, stage 4 (severe): Secondary | ICD-10-CM | POA: Diagnosis not present

## 2021-12-31 ENCOUNTER — Telehealth: Payer: Self-pay

## 2021-12-31 NOTE — Telephone Encounter (Signed)
Pt called and stated she is having a lot of shortness of breath when walking, she has weight gain but unsure of how much, and feet are swollen.  She sent remote transmission for review.   Prescribed:  Torsemide 20 mg Take 3 tablets (60 mg) once daily. Daily dosage is in blister pack.   Spironolactone 25 mg take 0.5 tablet (12.5 mg total) daily.  Labs: 12/21/2021 Creatinine 1.9,   BUN 32, Potassium 3.7, Sodium 142 GFR 26 09/08/2021 Creatinine 1.91, BUN 28, Potassium 4.1, Sodium 138, GFR 28 06/23/2021 Creatinine 1.9,   BUN 31, Potassium 3.9, Sodium 147, GFR 26 06/16/2021 Creatinine 1.9,   BUN 25, Potassium 4.1, Sodium 141, GFR 26  06/09/2021 Creatinine 1.81, BUN 27, Potassium 4.0, Sodium 141, GFR 30  Advised will route to Dr Aquilla Hacker' nurse Alvis Lemmings, RN for follow up.     12/31/2021 Optivol thoracic impedance suggesting possible fluid accumulation starting 9/7.  Unable to maintain normal fluid levels since 7/12.

## 2021-12-31 NOTE — Telephone Encounter (Signed)
I spoke with the patient regarding her ICM transmission report from today with Katherine Rosales.  Per the patient, she is SOB.  Weight is 217 lbs.  Per ICM transmission on 12/21/21 weight trends are documented as: 07/03/2021 Weight: 203 lbs 12/01/2021 Weight: 215 lbs 12/08/2021 Weight: 207 lbs   The patient advised that she felt better after last taking extra torsemide on 12/04/21:  take torsemide 20 mg: 3 tablets (60 mg) BID x 3 days, then resume 3 tablets (60 mg) once daily  The patient would like to try another course of higher dose torsemide. I advised her she should have 1 refill on the PRN dosing and she should contact her pharmacy for this.  She will take torsemide 60 mg BID x 3 days, then resume 60 mg once daily.  I have also advised her that this is most likely a short term fix and we are also going to have to be cautious with her renal function. I have recommended that she be seen by Dr. [name] next week to follow up on her fluid status.   The patient voices understanding and is agreeable.   She is scheduled to see Dr. [name] on 01/05/22 at 8:00 am.

## 2022-01-04 ENCOUNTER — Encounter: Payer: Self-pay | Admitting: *Deleted

## 2022-01-04 ENCOUNTER — Ambulatory Visit (INDEPENDENT_AMBULATORY_CARE_PROVIDER_SITE_OTHER): Payer: Medicare HMO

## 2022-01-04 DIAGNOSIS — I5022 Chronic systolic (congestive) heart failure: Secondary | ICD-10-CM

## 2022-01-04 DIAGNOSIS — Z95810 Presence of automatic (implantable) cardiac defibrillator: Secondary | ICD-10-CM

## 2022-01-04 NOTE — Progress Notes (Signed)
Katherine Rosales, female   DOB: 1950-11-08, 71 y.o.   MRN: [id number] Patient Care Team: Dion Body, MD as PCP - General (Family Medicine) Deboraha Sprang, MD as PCP - Cardiology (Cardiology)   HPI  Katherine Rosales is a 71 y.o. female Seen in followup for Medtronic  ICD implanted for nonischemic cardiomyopathy and primary prevention. She has chronic systolic failure. Generator replacement in 8/14.  10/21 acute heart failure associated with atrial fibrillation with a rapid rate.  Temporally related to discontinuing her CPAP  Now anticoagulated with Eliquis no bleeding  Seen 4/22 with increasing burden of nonsustained ventricular tachycardia.  Resumed her Aldactone 12.5 and transition her back from metoprolol--carvedilol.  In the interval started her on Entresto and she is considerably better.  High-frequency PVCs 12/22 >> amiodarone  Date PVCs  12/22 16%  2/23 4.9%   Patient denies symptoms of respiratory, GI intolerance, sun sensitivity attributable to amiodarone.  Mild tremor left hand     Seen in follow up for acute/chronic HFrEF    The patient denies chest pain,, nocturnal dyspnea, .  There have been no palpitations, lightheadedness or syncope.  Complains of shortness of breath with dyspnea on exertion, abdominal distention and peripheral edema.  Conversations with ICM clinic showed increased OptiVol.  Initial response in September and then reaccumulation.  Recommendations for increasing of her torsemide from 30 daily to 30 twice daily got misinterpreted as 30 in the morning and 10 in the afternoon.  Fluid intake is exuberant.  (Per PCP) salt intake is modest.     Date Cr K BNP TSH LFTs Hgb  10/20 1.2 4.1    11.6  5/21 1.3 4.6    (2/21) 11.8   12/21 1.31 3.8    (10/21)12.5  4/22 1.28 3.8      12/22 1.7     14.7  05/07/21 2.41 4.1      05/15/21 2.04 3.4 875   13.2 (1/23)  05/19/21 1.99 3.7  8.15    3/23 1.81 4.0  1.62    9/23 1.9 3.7  2.22 17 12.2      DATE TEST EF%    6/14 Echo   20-25 %   9/20 Echo   30-35 %   5/22 Echo 20-25%       Past Medical History:  Diagnosis Date   6949 Lead-Medtronic    Anemia 11/12/2016   Anxiety 12/25/2013   Automatic implantable cardioverter-defibrillator in situ    Breast cancer ([place]) 03/1995   right breast ca with lumpectomy and chemo and rad   Cardiomyopathy, nonischemic ([place])    a. Prior cath w/ nonobs CAD; b. s/p AICD w/ subsequent upgrade 11/2012-> MDT Evera XT VR single lead ICD (ser # [id number]); c. 12/2018 Echo: EF 30-35%; d. 08/2020 Echo: EF 20-25%, glob HK. Mod elev RVSP. Mod dil LA, mild to mod RAE. Mod MR/TR. Mild-mod AoV sclerosis.   Chronic HFrEF (heart failure with reduced ejection fraction) ([place])    a. 12/2018 Echo: EF 30-35%; b. 08/2020 Echo: EF 20-25%, glob HK.   CKD (chronic kidney disease), stage III ([place]) 11/12/2016   Depression    Diabetes mellitus    Type II Diabetes   Hypertension    Hypothyroidism    ICD (implantable cardiac defibrillator), singleMedtronic    NASH (nonalcoholic steatohepatitis)    OSA (obstructive sleep apnea)    a. On CPAP.   PAF (paroxysmal atrial fibrillation) ([place])    a. 09/2020 s/p DCCV.   Personal history  of chemotherapy 1996   right breast ca   Personal history of radiation therapy 1996   right breast ca   PVC's (premature ventricular contractions)    a. 08/2020 Zio: 8.1% PVC burden.   Sleep apnea    On CPAP   Spleen enlarged    Systolic heart failure, chronic ([place])     Past Surgical History:  Procedure Laterality Date   ABDOMINAL HYSTERECTOMY     BREAST BIOPSY Right 11/26/2015   - FAT NECROSIS, FIBROSIS, AND CHRONIC INFLAMMATION.    BREAST BIOPSY Right 12/20/2018   Affirm Bx- X-clip- path pending   BREAST BIOPSY WITH RADIO FREQUENCY LOCALIZER Right 10/18/2019   Procedure: RF TAG GUIDED EXCISIONAL BREAST BIOPSY;  Surgeon: Benjamine Sprague, DO;  Location: [facility] ORS;  Service: General;  Laterality: Right;   BREAST EXCISIONAL BIOPSY Right 03/1995   + breast ca  chemo and rad. 9:00 region   BREAST EXCISIONAL BIOPSY Right 2011   surgical bx neg  11:00 region   BREAST LUMPECTOMY Right 1996   CARDIOVERSION N/A 02/13/2020   Procedure: CARDIOVERSION;  Surgeon: Kate Sable, MD;  Location: [facility] ORS;  Service: Cardiovascular;  Laterality: N/A;   CARDIOVERSION N/A 09/29/2020   Procedure: CARDIOVERSION;  Surgeon: Wellington Hampshire, MD;  Location: [facility];  Service: Cardiovascular;  Laterality: N/A;   CARDIOVERSION N/A 05/08/2021   Procedure: CARDIOVERSION;  Surgeon: Minna Merritts, MD;  Location: [facility] ORS;  Service: Cardiovascular;  Laterality: N/A;   CHOLECYSTECTOMY     COLONOSCOPY  03/05/2013   COLONOSCOPY W/ POLYPECTOMY  03/05/2013   COLONOSCOPY WITH PROPOFOL N/A 10/03/2017   Procedure: COLONOSCOPY WITH PROPOFOL;  Surgeon: Manya Silvas, MD;  Location: [facility] ENDOSCOPY;  Service: Endoscopy;  Laterality: N/A;   ESOPHAGOGASTRODUODENOSCOPY  01/11/2003, 06/25/2015   ESOPHAGOGASTRODUODENOSCOPY (EGD) WITH PROPOFOL N/A 06/25/2015   Procedure: ESOPHAGOGASTRODUODENOSCOPY (EGD) WITH PROPOFOL;  Surgeon: Manya Silvas, MD;  Location: [facility] ENDOSCOPY;  Service: Endoscopy;  Laterality: N/A;   FOOT SURGERY Right    IMPLANTABLE CARDIOVERTER DEFIBRILLATOR (ICD) GENERATOR CHANGE N/A 11/15/2012   Procedure: ICD GENERATOR CHANGE;  Surgeon: Deboraha Sprang, MD;  Location: [facility] CATH LAB;  Service: Cardiovascular;  Laterality: N/A;   JOINT REPLACEMENT  2009   Total Knee Replacement, Left   LEAD REVISION N/A 11/15/2012   Procedure: LEAD REVISION;  Surgeon: Deboraha Sprang, MD;  Location: [facility] CATH LAB;  Service: Cardiovascular;  Laterality: N/A;   PACEMAKER INSERTION  06/30/2005   Medtronic Virtuoso VR ICD - Nonischemic cardiomyopathy potentially related to adriamycin, class 3 congestive failture with a narrow QRS and a negative TDI   TONSILLECTOMY     TUBAL LIGATION      Current Outpatient Medications  Medication Sig Dispense Refill   amiodarone (PACERONE) 200 MG tablet  Take 1 tablet (200 mg total) by mouth daily. 30 tablet 2   apixaban (ELIQUIS) 5 MG TABS tablet TAKE 1 TABLET BY MOUTH TWICE DAILY 60 tablet 6   Ascorbic Acid (VITAMIN C) 1000 MG tablet Take 1,000 mg by mouth in the morning.     Calcium Carb-Cholecalciferol (CALCIUM 600 + D PO) Take 1 tablet by mouth in the morning.     Calcium Polycarbophil (FIBER-CAPS PO) Take 1 capsule by mouth 2 (two) times daily.     carvedilol (COREG) 3.125 MG tablet TAKE 1 TABLET BY MOUTH TWICE DAILY 56 tablet 11   CINNAMON PO Take 1,000 mg by mouth in the morning.     denosumab (PROLIA) 60 MG/ML SOSY injection Inject 60 mg  into the skin every 6 (six) months.     docusate sodium (COLACE) 100 MG capsule Take 300 mg by mouth at bedtime.      DULoxetine (CYMBALTA) 60 MG capsule Take 60 mg by mouth daily.     empagliflozin (JARDIANCE) 25 MG TABS tablet Take 25 mg by mouth daily.     gabapentin (NEURONTIN) 600 MG tablet Take 600 mg by mouth 2 (two) times daily.     glimepiride (AMARYL) 2 MG tablet Take 1 tablet (2 mg) by mouth twice daily     levothyroxine (SYNTHROID) 125 MCG tablet TAKE 1 TABLET IN THE MORNING BEFORE BREAKFAST 90 tablet 3   Multiple Vitamin (MULTIVITAMIN WITH MINERALS) TABS tablet Take 1 tablet by mouth daily. Centrum Silver     Multiple Vitamins-Minerals (OCUVITE PO) Take 1 tablet by mouth in the morning.     omeprazole (PRILOSEC) 20 MG capsule Take 20 mg by mouth in the morning.     sacubitril-valsartan (ENTRESTO) 24-26 MG Take 1 tablet by mouth 2 (two) times daily. 180 tablet 0   spironolactone (ALDACTONE) 25 MG tablet TAKE (1/2) TABLET BY MOUTH ONCE DAILY. 14 tablet 11   torsemide (DEMADEX) 20 MG tablet TAKE (3) TABLETS BY MOUTH ONCE DAILY. 90 tablet 3   torsemide (DEMADEX) 20 MG tablet Take 3 additional tablets (60 mg) by mouth at lunchtime x 3 days 9 tablet 1   No current facility-administered medications for this visit.   Allergies  Allergen Reactions   Ibuprofen Other (See Comments)    Avoid due  to stage 3 kidney disease     Review of Systems negative except from HPI and PMH  Physical Exam: BP 96/60   Pulse 73   Ht 4' 11"  (1.499 m)   Wt 217 lb 6.4 oz (98.6 kg)   SpO2 97%   BMI 43.91 kg/m  Well developed and well nourished in no acute distress HENT normal Neck supple with JVP-8-10 Clear Device pocket well healed; without hematoma or erythema.  There is no tethering  Regular rate and rhythm, no gallop 2/6 murmur Abd-soft with active BS No Clubbing cyanosis 1+ edema Skin-warm and dry appears to have capsulitis bilateral lower extremities likely related to venous hypertension A & Oriented  Grossly normal sensory and motor function  ECG sinus at 73 Levels 18/15/48 Isolated PVC  Assessment and  Plan  Congestive heart failure-chronic    Nonischemic cardiomyopathy  Hypertension Orthostatic lightheadedness  PVCs reduced  RBBB superior axis  Implantable defibrillator-Medtronic     IVCD-monophasic QRS lead V6     Renal insufficiency grade 3 - 4(creatinine 1.7 GFR 46. CG and 31 CKD-EPI (for obesity)  Sleep apnea  -- compliant  Atrial fibrillation-persistent holding sinus rhythm//Freq atrial ectopy and atrial tachycardia   Tremor left hand    Volume overloaded with edema and JVP.  OptiVol is also up.  We will increase her torsemide from 60 mg daily to 60 mg twice daily x5 days.  We have reviewed the importance of fluid restriction but also the challenge of trying to navigate between volume overload and her renal issues.  We will check a metabolic profile in about 10 days at the same time as we check a transmission.  Holding sinus rhythm on amiodarone, and surveillance laboratories were normal in 9/23; no bleeding.  Continue on Eliquis 5 twice daily  Lightheadedness falls and low blood pressure.  We will discontinue her Entresto and transition her to losartan 25 mg daily.  We will continue carvedilol  and spironolactone for her cardiomyopathy  We will check an  echocardiogram.  Histograms suggest a relatively diminished PVC burden.  Given her symptoms, and her widening QRS we will need to consider CRT upgrade

## 2022-01-04 NOTE — Progress Notes (Signed)
EPIC Encounter for ICM Monitoring  Patient Name: Katherine Rosales is a 71 y.o. female Date: 01/04/2022 Primary Care Physican: Dion Body, MD Primary Cardiologist: [name] Electrophysiologist: [name] 07/03/2021 Weight: 203 lbs 12/01/2021 Weight: 215 lbs 12/08/2021   Weight: 207 lbs 12/31/2021 Weight: 217 lbs 01/04/2022 Weight: Not weighting          Spoke with patient and heart failure questions reviewed.  Transmission results reviewed.  Pt reports breathing has improved since taking 2 of the 3 days of extra 60 mg daily.   Optivol thoracic impedance suggesting possible fluid accumulation starting 9/7 but trending back toward baseline after taking Torsemide 60 mg bid as instructed on 9/28.  Difficulty maintaining normal fluid levels since 7/12.   Prescribed:  Torsemide 20 mg Take 3 tablets (60 mg) once daily. Daily dosage is in blister pack.   Spironolactone 25 mg take 0.5 tablet (12.5 mg total) daily.   Labs: 09/08/2021 Creatinine 1.91, BUN 28, Potassium 4.1, Sodium 138, GFR 28 06/23/2021 Creatinine 1.9,   BUN 31, Potassium 3.9, Sodium 147, GFR 26 06/16/2021 Creatinine 1.9,   BUN 25, Potassium 4.1, Sodium 141, GFR 26  06/09/2021 Creatinine 1.81, BUN 27, Potassium 4.0, Sodium 141, GFR 30 05/19/2021 Creatinine 1.99, BUN 25, Potassium 3.7, Sodium 141 05/15/2021 Creatinine 2.04, BUN 29, Potassium 3.4, Sodium 139, GFR 26 05/07/2021 Creatinine 2.41, BUN 36, Potassium 4.1, Sodium 140, GFR 21 A complete set of results can be found in Results Review.   Recommendations:  She has completed 2 of the 3 days of extra 60 mg Torsemide daily and tomorrow will be 3rd day.  She has appt with Dr [name] tomorrow morning.     Follow-up plan: ICM clinic phone appointment on 01/18/2022 to recheck fluid levels.   91 day device clinic remote transmission 02/08/2022.    EP/Cardiology Office Visits: 01/05/2022 with Dr [name].  03/16/2022 with Dr [name].   Copy of ICM check sent to Dr. [name] and Dr Olin Pia nurse  Alvis Lemmings, RN as Juluis Rainier for 10/3 OV.    3 month ICM trend: 01/04/2022.    12-14 Month ICM trend:     Katherine Billings, RN 01/04/2022 10:19 AM

## 2022-01-05 ENCOUNTER — Ambulatory Visit: Payer: Medicare HMO | Attending: Internal Medicine | Admitting: Internal Medicine

## 2022-01-05 ENCOUNTER — Encounter: Payer: Self-pay | Admitting: Internal Medicine

## 2022-01-05 VITALS — BP 96/60 | HR 73 | Ht 59.0 in | Wt 217.4 lb

## 2022-01-05 DIAGNOSIS — Z79899 Other long term (current) drug therapy: Secondary | ICD-10-CM

## 2022-01-05 DIAGNOSIS — E1143 Type 2 diabetes mellitus with diabetic autonomic (poly)neuropathy: Secondary | ICD-10-CM | POA: Insufficient documentation

## 2022-01-05 DIAGNOSIS — N1832 Chronic kidney disease, stage 3b: Secondary | ICD-10-CM | POA: Diagnosis not present

## 2022-01-05 DIAGNOSIS — Z87441 Personal history of nephrotic syndrome: Secondary | ICD-10-CM | POA: Insufficient documentation

## 2022-01-05 DIAGNOSIS — F331 Major depressive disorder, recurrent, moderate: Secondary | ICD-10-CM | POA: Insufficient documentation

## 2022-01-05 DIAGNOSIS — I428 Other cardiomyopathies: Secondary | ICD-10-CM | POA: Insufficient documentation

## 2022-01-05 DIAGNOSIS — I480 Paroxysmal atrial fibrillation: Secondary | ICD-10-CM | POA: Diagnosis not present

## 2022-01-05 DIAGNOSIS — E039 Hypothyroidism, unspecified: Secondary | ICD-10-CM | POA: Insufficient documentation

## 2022-01-05 DIAGNOSIS — G4700 Insomnia, unspecified: Secondary | ICD-10-CM | POA: Insufficient documentation

## 2022-01-05 DIAGNOSIS — Z95810 Presence of automatic (implantable) cardiac defibrillator: Secondary | ICD-10-CM

## 2022-01-05 DIAGNOSIS — I493 Ventricular premature depolarization: Secondary | ICD-10-CM | POA: Diagnosis not present

## 2022-01-05 DIAGNOSIS — I10 Essential (primary) hypertension: Secondary | ICD-10-CM | POA: Diagnosis not present

## 2022-01-05 DIAGNOSIS — G8929 Other chronic pain: Secondary | ICD-10-CM | POA: Insufficient documentation

## 2022-01-05 DIAGNOSIS — I5022 Chronic systolic (congestive) heart failure: Secondary | ICD-10-CM | POA: Diagnosis not present

## 2022-01-05 MED ORDER — TORSEMIDE 20 MG PO TABS: ORAL_TABLET | ORAL | 1 refills | Status: DC

## 2022-01-05 MED ORDER — LOSARTAN POTASSIUM 25 MG PO TABS: 25.0000 mg | ORAL_TABLET | Freq: Every day | ORAL | 1 refills | Status: DC

## 2022-01-05 NOTE — Patient Instructions (Addendum)
Transmission with Margarita Grizzle around 01/15/22  Medication Instructions:  - Your physician has recommended you make the following change in your medication:   1) STOP Entresto  2) START Losartan 25 mg: - take 1 tablet by mouth once daily  3) CHANGE Torsemide 20 mg: - take 3 tablets (60 mg) by mouth in the AM & take 3 tablets (60 mg) by mouth in the PM x 5 days, - then resume 3 tablets (60 mg) by mouth once daily    *If you need a refill on your cardiac medications before your next appointment, please call your pharmacy*   Lab Work: - Your physician recommends that you return for lab work in: 10 days (around 01/15/22)  [facility] 1st desk on the right to check in (REGISTRATION)  Lab hours: Monday- Friday (7:30 am- 5:30 pm)   If you have labs (blood work) drawn today and your tests are completely normal, you will receive your results only by: MyChart Message (if you have MyChart) OR A paper copy in the mail If you have any lab test that is abnormal or we need to change your treatment, we will call you to review the results.   Testing/Procedures: - Your physician has requested that you have an echocardiogram. Echocardiography is a painless test that uses sound waves to create images of your heart. It provides your doctor with information about the size and shape of your heart and how well your heart's chambers and valves are working. This procedure takes approximately one hour. There are no restrictions for this procedure. There is a possibility that an IV may need to be started during your test to inject an image enhancing agent. This is done to obtain more optimal pictures of your heart. Therefore we ask that you do at least drink some water prior to coming in to hydrate your veins.     Follow-Up: At [facility], you and your health needs are our priority.  As part of our continuing mission to provide you with exceptional heart care, we have created  designated Provider Care Teams.  These Care Teams include your primary Cardiologist (physician) and Advanced Practice Providers (APPs -  Physician Assistants and Nurse Practitioners) who all work together to provide you with the care you need, when you need it.  We recommend signing up for the patient portal called "MyChart".  Sign up information is provided on this After Visit Summary.  MyChart is used to connect with patients for Virtual Visits (Telemedicine).  Patients are able to view lab/test results, encounter notes, upcoming appointments, etc.  Non-urgent messages can be sent to your provider as well.   To learn more about what you can do with MyChart, go to [email or web address].    Your next appointment:   As scheduled  The format for your next appointment:   In Person  Provider:   [name], MD    Other Instructions N/a  Important Information About Sugar

## 2022-01-18 ENCOUNTER — Ambulatory Visit (INDEPENDENT_AMBULATORY_CARE_PROVIDER_SITE_OTHER): Payer: Medicare HMO

## 2022-01-18 ENCOUNTER — Telehealth: Payer: Self-pay | Admitting: Internal Medicine

## 2022-01-18 DIAGNOSIS — Z95810 Presence of automatic (implantable) cardiac defibrillator: Secondary | ICD-10-CM

## 2022-01-18 DIAGNOSIS — I5022 Chronic systolic (congestive) heart failure: Secondary | ICD-10-CM

## 2022-01-18 NOTE — Progress Notes (Addendum)
Pt left message that she has an appt with PCP today at 3:00 PM.  She is asking for the labs that Dr [name] ordered, be sent to Dr Netty Starring so she can have them drawn there today.  Message sent to Dr Olin Pia nurse regarding request for copy of lab order to be sent to PCP.

## 2022-01-18 NOTE — Telephone Encounter (Signed)
Pt c/o Shortness Of Breath: STAT if SOB developed within the last 24 hours or pt is noticeably SOB on the phone  1. Are you currently SOB (can you hear that pt is SOB on the phone)? No   2. How long have you been experiencing SOB? Going on since her last OV (01/05/22)  3. Are you SOB when sitting or when up moving around? Moving around   4. Are you currently experiencing any other symptoms? Pt states she has a sinus infection

## 2022-01-18 NOTE — Telephone Encounter (Signed)
I called and spoke with the patient. She was out of town this weekend in a camper in [place]. [place]. She advised the she stayed at the camp site, but could not walk very far due to SOB.  She took her C-PAP and had her regular medications.   I advised the patient that I can hear in her voice that she sounds conjested at this time. She states she used Vick's vapor rub to her chest/ neck area to see if this would help. I inquired if she has been confirmed with a sinus infection and she advised that she has not- she just assumes this is what she has. I have advised the patient that I need her to call her PCP and be seen today by them or an Urgent Care.  She is advised that we need to rule out COVID as she has not tested for this/ a sinus infection that can be treated with antibiotics.  I have advised the patient that with her underlying illness, I need her to see if she has something else that can be treated prior to [place] trying to adjust her diuretics.   The patient voices understanding and is agreeable.  She is aware to call me back after further evaluation by her PCP/ Urgent Care so we know how to manage her care on our end after that.  Again, the patient voices understanding and is agreeable.  She was very appreciative of the call back.

## 2022-01-18 NOTE — Telephone Encounter (Addendum)
I spoke with Sharman Cheek, RN (ICM clinic).  The patient's optivol levels are trending above baseline showing volume overload.  Margarita Grizzle spoke with the patient to let her know and was advised by the patient she has an appointment to follow up with her PCP, but this is not until tomorrow at 3:00 pm.  The patient was requesting her BMP order be faxed to her PCP office- this sent via Epic fax function.   I have also spoken with the patient and advised her if her symptoms worsen in any way today, she should report to the ER immediately to rule out any underlying illness.  The patient verbalizes understanding and is agreeable.  She was also requesting an appointment with our clinic this week. She is aware Dr. [name] is out of the office this week, but asked if she could follow up with Dr. Garen Lah, whom she has seen previously. I have scheduled her to be seen first available on Friday 01/22/22 at 1:40 pm.  The patient is agreeable.

## 2022-01-18 NOTE — Progress Notes (Signed)
EPIC Encounter for ICM Monitoring  Patient Name: Katherine Rosales is a 71 y.o. female Date: 01/18/2022 Primary Care Physican: Dion Body, MD Primary Cardiologist: [name] Electrophysiologist: [name] 07/03/2021 Weight: 203 lbs 12/01/2021 Weight: 215 lbs 12/08/2021   Weight: 207 lbs 12/31/2021 Weight: 217 lbs 01/04/2022 Weight: Not weighting          Pt spoke with Alvis Lemmings, RN this AM regarding SOB.  Pt was out of town this past weekend and had difficulty walking due to SOB per phone note.      Optivol thoracic impedance worsening and suggesting possible fluid continues since 9/11 even after taking extra Torsemide over the last couple of months.  Difficulty maintaining normal fluid levels since 7/12.   Prescribed:  Torsemide 20 mg Take 3 tablets (60 mg) once daily. Daily dosage is in blister pack.   Spironolactone 25 mg take 0.5 tablet (12.5 mg total) daily.   Labs: 09/08/2021 Creatinine 1.91, BUN 28, Potassium 4.1, Sodium 138, GFR 28 06/23/2021 Creatinine 1.9,   BUN 31, Potassium 3.9, Sodium 147, GFR 26 06/16/2021 Creatinine 1.9,   BUN 25, Potassium 4.1, Sodium 141, GFR 26  06/09/2021 Creatinine 1.81, BUN 27, Potassium 4.0, Sodium 141, GFR 30 05/19/2021 Creatinine 1.99, BUN 25, Potassium 3.7, Sodium 141 05/15/2021 Creatinine 2.04, BUN 29, Potassium 3.4, Sodium 139, GFR 26 05/07/2021 Creatinine 2.41, BUN 36, Potassium 4.1, Sodium 140, GFR 21 A complete set of results can be found in Results Review.   Recommendations:  Copy sent to Dr [name] and Dr Aquilla Hacker RN Alvis Lemmings since she spoke with patient this AM.     Follow-up plan: ICM clinic phone appointment on 01/25/2022 to recheck fluid levels.   91 day device clinic remote transmission 02/08/2022.    EP/Cardiology Office Visits:  03/16/2022 with Dr [name].   Copy of ICM check sent to Dr. [name]  3 month ICM trend: 01/18/2022.    12-14 Month ICM trend:     [name], RN 01/18/2022 10:06 AM

## 2022-01-18 NOTE — Progress Notes (Signed)
Spoke with patient. She is waiting on call back from PCP.   Congestion in sinuses can be heard over the phone. Advised I spoke with Alvis Lemmings this AM and we are advising her to go to ER today if she cannot be seen by PCP.   Reviewed remote transmission results and she says she knows she has fluid as well as a respiratory problem.  Weight today is 218 lbs.  She will go to ER if unable to see her PCP.   She is aware Dr. [name] is out of the office this week.

## 2022-01-19 ENCOUNTER — Telehealth: Payer: Self-pay | Admitting: Internal Medicine

## 2022-01-19 DIAGNOSIS — R059 Cough, unspecified: Secondary | ICD-10-CM | POA: Diagnosis not present

## 2022-01-19 DIAGNOSIS — F331 Major depressive disorder, recurrent, moderate: Secondary | ICD-10-CM | POA: Diagnosis not present

## 2022-01-19 DIAGNOSIS — Z03818 Encounter for observation for suspected exposure to other biological agents ruled out: Secondary | ICD-10-CM | POA: Diagnosis not present

## 2022-01-19 NOTE — Telephone Encounter (Signed)
Patient returned nurses call

## 2022-01-20 NOTE — Telephone Encounter (Signed)
Late entry:  I called and spoke with the patient yesterday afternoon after 5 pm. I inquired if she was able to follow up with her PCP yesterday afternoon and per the patient, she was able to do this.  She currently has flu/ COVID/ & RSV tests pending.   She was given advised to take Mucinex, Tussinex, Zyrtec and use flonase.   She advised her weight was 220 lbs at her PCP office.   Per the patient/ person with her, other medication adjustments were made to cymbalta and losartan.   I advised the patient to keep her follow up appointment with Dr. Garen Lah as planned on Friday and bring her medication bottles with her to that visit.   I have also asked her to please let me know if she test positive for flu/ covid/ RSV as this may effect her in person visit.   The patient voices understanding and is agreeable.

## 2022-01-22 ENCOUNTER — Encounter: Payer: Self-pay | Admitting: Cardiology

## 2022-01-22 ENCOUNTER — Ambulatory Visit: Payer: Medicare HMO | Attending: Cardiology | Admitting: Cardiology

## 2022-01-22 VITALS — BP 90/68 | HR 77 | Ht 59.0 in | Wt 213.4 lb

## 2022-01-22 DIAGNOSIS — I428 Other cardiomyopathies: Secondary | ICD-10-CM | POA: Diagnosis not present

## 2022-01-22 DIAGNOSIS — Z95810 Presence of automatic (implantable) cardiac defibrillator: Secondary | ICD-10-CM | POA: Diagnosis not present

## 2022-01-22 DIAGNOSIS — I480 Paroxysmal atrial fibrillation: Secondary | ICD-10-CM

## 2022-01-22 MED ORDER — TORSEMIDE 20 MG PO TABS: 60.0000 mg | ORAL_TABLET | Freq: Two times a day (BID) | ORAL | 3 refills | Status: DC

## 2022-01-22 NOTE — Patient Instructions (Signed)
Medication Instructions:   Your physician has recommended you make the following change in your medication:    STOP taking your Carvedilol.  2.    STOP taking your Losartan  3.    STOP taking your Spironolactone.  4.   INCREASE your Torsemide to 60 MG twice a day.  *If you need a refill on your cardiac medications before your next appointment, please call your pharmacy*   Lab Work:  Your physician recommends that you return for lab work (BMP) in: 1 hour before your follow up appointment on Tues 10/24. (8 AM)  - Please go to the [facility]. You will check in at the front desk to the right as you walk into the atrium. Valet Parking is offered if needed. - No appointment needed. You may go any day between 7 am and 6 pm.    Follow-Up: At [facility], you and your health needs are our priority.  As part of our continuing mission to provide you with exceptional heart care, we have created designated Provider Care Teams.  These Care Teams include your primary Cardiologist (physician) and Advanced Practice Providers (APPs -  Physician Assistants and Nurse Practitioners) who all work together to provide you with the care you need, when you need it.  We recommend signing up for the patient portal called "MyChart".  Sign up information is provided on this After Visit Summary.  MyChart is used to connect with patients for Virtual Visits (Telemedicine).  Patients are able to view lab/test results, encounter notes, upcoming appointments, etc.  Non-urgent messages can be sent to your provider as well.   To learn more about what you can do with MyChart, go to [email or web address].    Your next appointment:   Tues 01/26/22 at 9:00 AM   The format for your next appointment:   In Person  Provider:   Kate Sable, MD    Other Instructions   Important Information About Sugar

## 2022-01-22 NOTE — Progress Notes (Signed)
Cardiology Office Note:    Date:  01/22/2022   ID:  Katherine Rosales, DOB 11-08-1950, MRN [id number]  PCP:  Dion Body, MD  [facility] Cardiologist:  [name], MD  [facility] Electrophysiologist:  None   Referring MD: Dion Body, MD   Chief Complaint  Patient presents with   Follow-up    SOB, having shaking and dizziness since Pacedrone Rx    History of Present Illness:    Katherine Rosales is a 71 y.o. female with a hx of NICM s/p ICD (medtronic), last EF 30-35%, parox A. Fib s/p DCCV (02/13/2020, 05/2021 ), hypertension, diabetes, CKD stage III, OSA who presents due to shortness of breath.  Follows up with Dr. [name].  Has cardiac history as above.  Recently seen in the office with volume overload about 2 weeks ago.  Torsemide was increased to 60 mg twice daily x5 days.  She states symptoms of shortness of breath improved with taking Lasix twice a day, but returned after stopping Lasix.  Has been feeling tired, dizzy over the past several days.  OptiVol on ICD showed patient was retaining fluid.  She also takes Coreg, losartan, Aldactone 25 mg daily.  Does not check blood pressure frequently at home.  Prior notes Echo 08/2020 EF 20 to 25% device check on 11/12/2019 showed normal device functioning.   echocardiogram 12/08/2018 showed moderate to severely reduced ejection fraction, EF 30 to 35%.  Patient not sure how long she has been in atrial fibrillation.  States having low blood pressures at home with systolic in the 38G, lisinopril was decreased to 10 mg.  On follow-up with PCP yesterday, lisinopril stopped due to systolics in the 66Z.  She is supposed to be on a CPAP for OSA but has not been compliant.  Past Medical History:  Diagnosis Date   6949 Lead-Medtronic    Anemia 11/12/2016   Anxiety 12/25/2013   Automatic implantable cardioverter-defibrillator in situ    Breast cancer ([place]) 03/1995   right breast ca with lumpectomy and chemo and rad    Cardiomyopathy, nonischemic ([place])    a. Prior cath w/ nonobs CAD; b. s/p AICD w/ subsequent upgrade 11/2012-> MDT Evera XT VR single lead ICD (ser # [id number]); c. 12/2018 Echo: EF 30-35%; d. 08/2020 Echo: EF 20-25%, glob HK. Mod elev RVSP. Mod dil LA, mild to mod RAE. Mod MR/TR. Mild-mod AoV sclerosis.   Chronic HFrEF (heart failure with reduced ejection fraction) ([place])    a. 12/2018 Echo: EF 30-35%; b. 08/2020 Echo: EF 20-25%, glob HK.   CKD (chronic kidney disease), stage III ([place]) 11/12/2016   Depression    Diabetes mellitus    Type II Diabetes   Hypertension    Hypothyroidism    ICD (implantable cardiac defibrillator), singleMedtronic    NASH (nonalcoholic steatohepatitis)    OSA (obstructive sleep apnea)    a. On CPAP.   PAF (paroxysmal atrial fibrillation) ([place])    a. 09/2020 s/p DCCV.   Personal history of chemotherapy 1996   right breast ca   Personal history of radiation therapy 1996   right breast ca   PVC's (premature ventricular contractions)    a. 08/2020 Zio: 8.1% PVC burden.   Sleep apnea    On CPAP   Spleen enlarged    Systolic heart failure, chronic ([place])     Past Surgical History:  Procedure Laterality Date   ABDOMINAL HYSTERECTOMY     BREAST BIOPSY Right 11/26/2015   - FAT NECROSIS, FIBROSIS, AND  CHRONIC INFLAMMATION.    BREAST BIOPSY Right 12/20/2018   Affirm Bx- X-clip- path pending   BREAST BIOPSY WITH RADIO FREQUENCY LOCALIZER Right 10/18/2019   Procedure: RF TAG GUIDED EXCISIONAL BREAST BIOPSY;  Surgeon: Benjamine Sprague, DO;  Location: [facility] ORS;  Service: General;  Laterality: Right;   BREAST EXCISIONAL BIOPSY Right 03/1995   + breast ca chemo and rad. 9:00 region   BREAST EXCISIONAL BIOPSY Right 2011   surgical bx neg  11:00 region   BREAST LUMPECTOMY Right 1996   CARDIOVERSION N/A 02/13/2020   Procedure: CARDIOVERSION;  Surgeon: Kate Sable, MD;  Location: [facility] ORS;  Service: Cardiovascular;  Laterality: N/A;   CARDIOVERSION N/A 09/29/2020    Procedure: CARDIOVERSION;  Surgeon: Wellington Hampshire, MD;  Location: [place] [facility]S;  Service: Cardiovascular;  Laterality: N/A;   CARDIOVERSION N/A 05/08/2021   Procedure: CARDIOVERSION;  Surgeon: Minna Merritts, MD;  Location: [facility] ORS;  Service: Cardiovascular;  Laterality: N/A;   CHOLECYSTECTOMY     COLONOSCOPY  03/05/2013   COLONOSCOPY W/ POLYPECTOMY  03/05/2013   COLONOSCOPY WITH PROPOFOL N/A 10/03/2017   Procedure: COLONOSCOPY WITH PROPOFOL;  Surgeon: Manya Silvas, MD;  Location: [facility] ENDOSCOPY;  Service: Endoscopy;  Laterality: N/A;   ESOPHAGOGASTRODUODENOSCOPY  01/11/2003, 06/25/2015   ESOPHAGOGASTRODUODENOSCOPY (EGD) WITH PROPOFOL N/A 06/25/2015   Procedure: ESOPHAGOGASTRODUODENOSCOPY (EGD) WITH PROPOFOL;  Surgeon: Manya Silvas, MD;  Location: [facility] ENDOSCOPY;  Service: Endoscopy;  Laterality: N/A;   FOOT SURGERY Right    IMPLANTABLE CARDIOVERTER DEFIBRILLATOR (ICD) GENERATOR CHANGE N/A 11/15/2012   Procedure: ICD GENERATOR CHANGE;  Surgeon: Deboraha Sprang, MD;  Location: [facility] CATH LAB;  Service: Cardiovascular;  Laterality: N/A;   JOINT REPLACEMENT  2009   Total Knee Replacement, Left   LEAD REVISION N/A 11/15/2012   Procedure: LEAD REVISION;  Surgeon: Deboraha Sprang, MD;  Location: [facility] CATH LAB;  Service: Cardiovascular;  Laterality: N/A;   PACEMAKER INSERTION  06/30/2005   Medtronic Virtuoso VR ICD - Nonischemic cardiomyopathy potentially related to adriamycin, class 3 congestive failture with a narrow QRS and a negative TDI   TONSILLECTOMY     TUBAL LIGATION      Current Medications: Current Meds  Medication Sig   amiodarone (PACERONE) 200 MG tablet Take 1 tablet (200 mg total) by mouth daily.   apixaban (ELIQUIS) 5 MG TABS tablet TAKE 1 TABLET BY MOUTH TWICE DAILY   Ascorbic Acid (VITAMIN C) 1000 MG tablet Take 1,000 mg by mouth in the morning.   Calcium Carb-Cholecalciferol (CALCIUM 600 + D PO) Take 1 tablet by mouth in the morning.   Calcium Polycarbophil (FIBER-CAPS PO)  Take 1 capsule by mouth 2 (two) times daily.   chlorpheniramine-HYDROcodone (TUSSIONEX) 10-8 MG/5ML Take 8 mg of hydrocodone by mouth daily.   CINNAMON PO Take 1,000 mg by mouth in the morning.   denosumab (PROLIA) 60 MG/ML SOSY injection Inject 60 mg into the skin every 6 (six) months.   docusate sodium (COLACE) 100 MG capsule Take 300 mg by mouth at bedtime.    DULoxetine (CYMBALTA) 30 MG capsule Take 30 mg by mouth daily.   DULoxetine (CYMBALTA) 60 MG capsule Take 60 mg by mouth daily.   empagliflozin (JARDIANCE) 25 MG TABS tablet Take 25 mg by mouth daily.   gabapentin (NEURONTIN) 600 MG tablet Take 600 mg by mouth 2 (two) times daily.   glimepiride (AMARYL) 2 MG tablet Take 1 tablet (2 mg) by mouth twice daily   levothyroxine (SYNTHROID) 125 MCG tablet TAKE 1 TABLET  IN THE MORNING BEFORE BREAKFAST   Multiple Vitamin (MULTIVITAMIN WITH MINERALS) TABS tablet Take 1 tablet by mouth daily. Centrum Silver   Multiple Vitamins-Minerals (OCUVITE PO) Take 1 tablet by mouth in the morning.   omeprazole (PRILOSEC) 20 MG capsule Take 20 mg by mouth in the morning.   Sodium Fluoride (PREVIDENT 5000 BOOSTER PLUS) 1.1 % PSTE After flossing, brush this paste on all teeth for two full minutes at bedtime, Spit out excess but do not rinse out or eat/drink for 30 min.   [DISCONTINUED] carvedilol (COREG) 3.125 MG tablet TAKE 1 TABLET BY MOUTH TWICE DAILY   [DISCONTINUED] losartan (COZAAR) 25 MG tablet Take 1 tablet (25 mg total) by mouth daily. (Patient taking differently: Take 25 mg by mouth daily. Half table)   [DISCONTINUED] spironolactone (ALDACTONE) 25 MG tablet TAKE (1/2) TABLET BY MOUTH ONCE DAILY.   [DISCONTINUED] torsemide (DEMADEX) 20 MG tablet TAKE (3) TABLETS BY MOUTH ONCE DAILY.   [DISCONTINUED] torsemide (DEMADEX) 20 MG tablet Take 3 additional tablets (60 mg) by mouth at lunchtime x 5 days     Allergies:   Ibuprofen   Social History   Socioeconomic History   Marital status: Widowed     Spouse name: Not on file   Number of children: Not on file   Years of education: Not on file   Highest education level: Not on file  Occupational History   Occupation: Full time    Employer: [employer]  Tobacco Use   Smoking status: Never   Smokeless tobacco: Never  Vaping Use   Vaping Use: Never used  Substance and Sexual Activity   Alcohol use: No   Drug use: No   Sexual activity: Not on file  Other Topics Concern   Not on file  Social History Narrative   Married   Does not get regular exercise   Social Determinants of Health   Financial Resource Strain: Not on file  Food Insecurity: Not on file  Transportation Needs: Not on file  Physical Activity: Not on file  Stress: Not on file  Social Connections: Not on file     Family History: The patient's family history includes Breast cancer (age of onset: 71) in her sister; Prostate cancer in her father.  ROS:   Please see the history of present illness.     All other systems reviewed and are negative.  EKGs/Labs/Other Studies Reviewed:    The following studies were reviewed today:   EKG:  EKG is  ordered today.  The ekg ordered today demonstrates sinus rhythm, heart rate 77  Recent Labs: 05/07/2021: Hemoglobin 13.1; Platelets 136 05/19/2021: ALT 13; BNP 1,014.7; TSH 8.150 09/08/2021: BUN 28; Creatinine, Ser 1.91; Potassium 4.1; Sodium 138  Recent Lipid Panel No results found for: "CHOL", "TRIG", "HDL", "CHOLHDL", "VLDL", "LDLCALC", "LDLDIRECT"   Risk Assessment/Calculations:       Physical Exam:    VS:  BP 90/68 (BP Location: Left Arm, Patient Position: Sitting, Cuff Size: Large)   Pulse 77   Ht 4' 11"  (1.499 m)   Wt 213 lb 6.4 oz (96.8 kg)   SpO2 98%   BMI 43.10 kg/m     Wt Readings from Last 3 Encounters:  01/22/22 213 lb 6.4 oz (96.8 kg)  01/05/22 217 lb 6.4 oz (98.6 kg)  09/08/21 214 lb (97.1 kg)     GEN:  Well nourished, well developed in no acute distress HEENT: Normal NECK: No JVD; No carotid  bruits CARDIAC: Regular rate and rhythm, no murmurs  RESPIRATORY: Decreased breath sounds ABDOMEN: Soft, non-tender, non-distended MUSCULOSKELETAL:  No edema; No deformity  SKIN: Warm and dry NEUROLOGIC:  Alert and oriented x 3 PSYCHIATRIC:  Normal affect   ASSESSMENT:    1. PAF (paroxysmal atrial fibrillation) ([place])   2. NICM (nonischemic cardiomyopathy) ([place])   3. ICD (implantable cardioverter-defibrillator) in place   4. Morbid obesity ([place])    PLAN:    In order of problems listed above:  Paroxysmal atrial fibrillation,  S/p DCCV (02/13/2020,05/2021 )maintaining sinus rhythm on amiodarone, CHA2DS2-VASc of 5.  Continue amiodarone 200 mg daily, Eliquis 5 mg twice daily.  Maintaining sinus rhythm helps prevent worsening of heart failure exacerbation. Nonischemic cardiomyopathy, last EF 25%.  BP running low.  Patient appears euvolemic, diminished breath sounds, abdominal distention on clinical exam.  Increase torsemide to 60 mg daily.  Check BMP in 4 days.  Hold GDMT including Coreg, Aldactone, losartan.  Plan to restart GDMT as BP permits after patient achieves euvolemia. ICD, keep appointment with device clinic for checks Morbid obesity, diabetic, kidney disease.  Will consider Ozempic at follow-up visit.  Follow-up next week with myself.  Medication Adjustments/Labs and Tests Ordered: Current medicines are reviewed at length with the patient today.  Concerns regarding medicines are outlined above.  Orders Placed This Encounter  Procedures   Basic metabolic panel   EKG 78-GNFA   Meds ordered this encounter  Medications   torsemide (DEMADEX) 20 MG tablet    Sig: Take 3 tablets (60 mg total) by mouth 2 (two) times daily.    Dispense:  180 tablet    Refill:  3    Patient Instructions  Medication Instructions:   Your physician has recommended you make the following change in your medication:    STOP taking your Carvedilol.  2.    STOP taking your Losartan  3.    STOP  taking your Spironolactone.  4.   INCREASE your Torsemide to 60 MG twice a day.  *If you need a refill on your cardiac medications before your next appointment, please call your pharmacy*   Lab Work:  Your physician recommends that you return for lab work (BMP) in: 1 hour before your follow up appointment on Tues 10/24. (8 AM)  - Please go to the [facility]. You will check in at the front desk to the right as you walk into the atrium. Valet Parking is offered if needed. - No appointment needed. You may go any day between 7 am and 6 pm.    Follow-Up: At [facility], you and your health needs are our priority.  As part of our continuing mission to provide you with exceptional heart care, we have created designated Provider Care Teams.  These Care Teams include your primary Cardiologist (physician) and Advanced Practice Providers (APPs -  Physician Assistants and Nurse Practitioners) who all work together to provide you with the care you need, when you need it.  We recommend signing up for the patient portal called "MyChart".  Sign up information is provided on this After Visit Summary.  MyChart is used to connect with patients for Virtual Visits (Telemedicine).  Patients are able to view lab/test results, encounter notes, upcoming appointments, etc.  Non-urgent messages can be sent to your provider as well.   To learn more about what you can do with MyChart, go to [email or web address].    Your next appointment:   Tues 01/26/22 at 9:00 AM   The format for your next appointment:  In Person  Provider:   Kate Sable, MD    Other Instructions   Important Information About Sugar         Signed, Kate Sable, MD  01/22/2022 2:49 PM    [place]

## 2022-01-25 ENCOUNTER — Ambulatory Visit (INDEPENDENT_AMBULATORY_CARE_PROVIDER_SITE_OTHER): Payer: Medicare HMO

## 2022-01-25 DIAGNOSIS — Z95810 Presence of automatic (implantable) cardiac defibrillator: Secondary | ICD-10-CM

## 2022-01-25 DIAGNOSIS — I5022 Chronic systolic (congestive) heart failure: Secondary | ICD-10-CM

## 2022-01-25 NOTE — Progress Notes (Signed)
EPIC Encounter for ICM Monitoring  Patient Name: Katherine Rosales is a 71 y.o. female Date: 01/25/2022 Primary Care Physican: Dion Body, MD Primary Cardiologist: [name] Electrophysiologist: [name] 07/03/2021 Weight: 203 lbs 12/01/2021 Weight: 215 lbs 12/08/2021   Weight: 207 lbs 12/31/2021 Weight: 217 lbs 01/22/2022 Weight: 210 lbs          Spoke with patient and heart failure questions reviewed.  Transmission results reviewed.  Pt continues to cough and lots of phlegm and thinks this is from fluid.    Weight has dropped from 217 lbs to 210 lbs.     Optivol thoracic impedance suggesting ongoing possible fluid accumulation since 9/11. Impedance does show improvement after starting Torsemide 60 mg twice a day on 10/23.      Difficulty maintaining normal fluid levels since 7/12.   Prescribed:  Torsemide 20 mg Take 3 tablets (60 mg) by mouth twice a day (increased 10/20).  Confirmed she is taking Torsemide 60 mg twice a day. Spironolactone 25 mg take 0.5 tablet (12.5 mg total) daily.   Labs: 01/19/2022 Creatinine 2.0,   BUN 26, Potassium 4.5, Sodium 136 12/21/2021 Creatinine 1.9,   BUN 32, Potassium 3.7, Sodium 142 09/08/2021 Creatinine 1.9,   BUN 28, Potassium 4.1, Sodium 138, GFR 28 06/23/2021 Creatinine 1.9,   BUN 31, Potassium 3.9, Sodium 147, GFR 26 06/16/2021 Creatinine 1.9,   BUN 25, Potassium 4.1, Sodium 141, GFR 26  06/09/2021 Creatinine 1.81, BUN 27, Potassium 4.0, Sodium 141, GFR 30 05/19/2021 Creatinine 1.99, BUN 25, Potassium 3.7, Sodium 141 05/15/2021 Creatinine 2.04, BUN 29, Potassium 3.4, Sodium 139, GFR 26 05/07/2021 Creatinine 2.41, BUN 36, Potassium 4.1, Sodium 140, GFR 21 A complete set of results can be found in Results Review.   Recommendations:  Copy sent to Dr Garen Lah for review at 10/24 OV.   Any Recommendations will be given at 10/24 OV.     Follow-up plan: ICM clinic phone appointment on 02/15/2022 for 31 day and to recheck fluid levels.   91 day  device clinic remote transmission 02/08/2022.    EP/Cardiology Office Visits:  03/16/2022 with Dr [name].   Copy of ICM check sent to Dr. [name].  3 month ICM trend: 01/25/2022.    12-14 Month ICM trend:     [name], RN 01/25/2022 6:15 AM

## 2022-01-26 ENCOUNTER — Ambulatory Visit
Admission: RE | Admit: 2022-01-26 | Discharge: 2022-01-26 | Disposition: A | Discharge status: Home / Self Care | Payer: Medicare HMO | Attending: Cardiology | Admitting: Cardiology

## 2022-01-26 ENCOUNTER — Ambulatory Visit
Admission: RE | Admit: 2022-01-26 | Discharge: 2022-01-26 | Disposition: A | Discharge status: Home / Self Care | Payer: Medicare HMO | Source: Ambulatory Visit | Attending: Cardiology | Admitting: Cardiology

## 2022-01-26 ENCOUNTER — Other Ambulatory Visit
Admission: RE | Admit: 2022-01-26 | Discharge: 2022-01-26 | Disposition: A | Discharge status: Home / Self Care | Payer: Medicare HMO | Source: Ambulatory Visit | Attending: Cardiology | Admitting: Cardiology

## 2022-01-26 ENCOUNTER — Ambulatory Visit: Payer: Medicare HMO | Attending: Cardiology | Admitting: Cardiology

## 2022-01-26 ENCOUNTER — Encounter: Payer: Self-pay | Admitting: Cardiology

## 2022-01-26 VITALS — BP 98/72 | HR 105 | Wt 209.0 lb

## 2022-01-26 DIAGNOSIS — R053 Chronic cough: Secondary | ICD-10-CM | POA: Diagnosis not present

## 2022-01-26 DIAGNOSIS — J90 Pleural effusion, not elsewhere classified: Secondary | ICD-10-CM | POA: Diagnosis not present

## 2022-01-26 DIAGNOSIS — R0602 Shortness of breath: Secondary | ICD-10-CM | POA: Diagnosis not present

## 2022-01-26 DIAGNOSIS — Z95810 Presence of automatic (implantable) cardiac defibrillator: Secondary | ICD-10-CM | POA: Diagnosis not present

## 2022-01-26 DIAGNOSIS — I428 Other cardiomyopathies: Secondary | ICD-10-CM

## 2022-01-26 DIAGNOSIS — J811 Chronic pulmonary edema: Secondary | ICD-10-CM | POA: Diagnosis not present

## 2022-01-26 DIAGNOSIS — R059 Cough, unspecified: Secondary | ICD-10-CM | POA: Diagnosis not present

## 2022-01-26 DIAGNOSIS — I480 Paroxysmal atrial fibrillation: Secondary | ICD-10-CM | POA: Insufficient documentation

## 2022-01-26 LAB — BASIC METABOLIC PANEL
Anion gap: 14 (ref 5–15)
BUN: 31 mg/dL — ABNORMAL HIGH (ref 8–23)
CO2: 25 mmol/L (ref 22–32)
Calcium: 7.7 mg/dL — ABNORMAL LOW (ref 8.9–10.3)
Chloride: 97 mmol/L — ABNORMAL LOW (ref 98–111)
Creatinine, Ser: 2.41 mg/dL — ABNORMAL HIGH (ref 0.44–1.00)
GFR, Estimated: 21 mL/min — ABNORMAL LOW (ref >60)
Glucose, Bld: 176 mg/dL — ABNORMAL HIGH (ref 70–99)
Potassium: 3.7 mmol/L (ref 3.5–5.1)
Sodium: 136 mmol/L (ref 135–145)

## 2022-01-26 MED ORDER — METOLAZONE 5 MG PO TABS: 5.0000 mg | ORAL_TABLET | Freq: Every day | ORAL | 3 refills | Status: DC

## 2022-01-26 MED ORDER — TORSEMIDE 20 MG PO TABS: 40.0000 mg | ORAL_TABLET | Freq: Two times a day (BID) | ORAL | 3 refills | Status: DC

## 2022-01-26 NOTE — Patient Instructions (Signed)
Medication Instructions:   Your physician has recommended you make the following change in your medication:    START taking Metolazone 5 MG once a day, 30 mins prior to your Torsemide dose.  2.    DECREASE your Torsemide to 40 MG twice a day.   *If you need a refill on your cardiac medications before your next appointment, please call your pharmacy*   Lab Work:  Please get a BMP lab draw prior to your appointment on Tues. 02/02/22 at the [facility].    Testing/Procedures:  Please go to the [facility] imaging center for a Chest Xray after your appointment today.  [facility] [address] [place], [place] [address] [phone]    Follow-Up: At [facility], you and your health needs are our priority.  As part of our continuing mission to provide you with exceptional heart care, we have created designated Provider Care Teams.  These Care Teams include your primary Cardiologist (physician) and Advanced Practice Providers (APPs -  Physician Assistants and Nurse Practitioners) who all work together to provide you with the care you need, when you need it.  We recommend signing up for the patient portal called "MyChart".  Sign up information is provided on this After Visit Summary.  MyChart is used to connect with patients for Virtual Visits (Telemedicine).  Patients are able to view lab/test results, encounter notes, upcoming appointments, etc.  Non-urgent messages can be sent to your provider as well.   To learn more about what you can do with MyChart, go to [email or web address].    Your next appointment:      Tuesday 02/02/22 at 9:00  The format for your next appointment:   In Person  Provider:   Kate Sable, MD    Other Instructions    Important Information About Sugar

## 2022-01-26 NOTE — Progress Notes (Signed)
Cardiology Office Note:    Date:  01/26/2022   ID:  Katherine Rosales, DOB 12/20/1950, MRN [id number]  PCP:  Dion Body, MD  [facility] Cardiologist:  [name], MD  [facility] Electrophysiologist:  None   Referring MD: Dion Body, MD   Chief Complaint  Patient presents with   Follow-up    Testing follow up    History of Present Illness:    Katherine Rosales is a 71 y.o. female with a hx of NICM s/p ICD (medtronic), last EF 30-35%, parox A. Fib s/p DCCV (02/13/2020, 05/2021 ), hypertension, diabetes, CKD stage III, OSA who presents due to shortness of breath.  Previously seen for nonischemic cardiomyopathy and shortness of breath.  Torsemide was increased to 60 mg twice daily, states having adequate urination, OptiVol demonstrates some improvement in fluid, although patient still endorses coughing brown phlegm.  Started on antibiotics by PCP.  Tried taking Ozempic in the past, did not achieve any significant weight loss, Ozempic was stopped, Jardiance started by endocrinologist.  Coreg, losartan, Aldactone was stopped due to low blood pressures.   Prior notes Echo 08/2020 EF 20 to 25% device check on 11/12/2019 showed normal device functioning.   echocardiogram 12/08/2018 showed moderate to severely reduced ejection fraction, EF 30 to 35%.  Patient not sure how long she has been in atrial fibrillation.  States having low blood pressures at home with systolic in the 85U, lisinopril was decreased to 10 mg.  On follow-up with PCP yesterday, lisinopril stopped due to systolics in the 31S.  She is supposed to be on a CPAP for OSA but has not been compliant.  Past Medical History:  Diagnosis Date   6949 Lead-Medtronic    Anemia 11/12/2016   Anxiety 12/25/2013   Automatic implantable cardioverter-defibrillator in situ    Breast cancer ([place]) 03/1995   right breast ca with lumpectomy and chemo and rad   Cardiomyopathy, nonischemic ([place])    a. Prior cath w/ nonobs  CAD; b. s/p AICD w/ subsequent upgrade 11/2012-> MDT Evera XT VR single lead ICD (ser # [id number]); c. 12/2018 Echo: EF 30-35%; d. 08/2020 Echo: EF 20-25%, glob HK. Mod elev RVSP. Mod dil LA, mild to mod RAE. Mod MR/TR. Mild-mod AoV sclerosis.   Chronic HFrEF (heart failure with reduced ejection fraction) ([place])    a. 12/2018 Echo: EF 30-35%; b. 08/2020 Echo: EF 20-25%, glob HK.   CKD (chronic kidney disease), stage III ([place]) 11/12/2016   Depression    Diabetes mellitus    Type II Diabetes   Hypertension    Hypothyroidism    ICD (implantable cardiac defibrillator), singleMedtronic    NASH (nonalcoholic steatohepatitis)    OSA (obstructive sleep apnea)    a. On CPAP.   PAF (paroxysmal atrial fibrillation) ([place])    a. 09/2020 s/p DCCV.   Personal history of chemotherapy 1996   right breast ca   Personal history of radiation therapy 1996   right breast ca   PVC's (premature ventricular contractions)    a. 08/2020 Zio: 8.1% PVC burden.   Sleep apnea    On CPAP   Spleen enlarged    Systolic heart failure, chronic ([place])     Past Surgical History:  Procedure Laterality Date   ABDOMINAL HYSTERECTOMY     BREAST BIOPSY Right 11/26/2015   - FAT NECROSIS, FIBROSIS, AND CHRONIC INFLAMMATION.    BREAST BIOPSY Right 12/20/2018   Affirm Bx- X-clip- path pending   BREAST BIOPSY WITH RADIO FREQUENCY LOCALIZER Right  10/18/2019   Procedure: RF TAG GUIDED EXCISIONAL BREAST BIOPSY;  Surgeon: Benjamine Sprague, DO;  Location: [facility] ORS;  Service: General;  Laterality: Right;   BREAST EXCISIONAL BIOPSY Right 03/1995   + breast ca chemo and rad. 9:00 region   BREAST EXCISIONAL BIOPSY Right 2011   surgical bx neg  11:00 region   BREAST LUMPECTOMY Right 1996   CARDIOVERSION N/A 02/13/2020   Procedure: CARDIOVERSION;  Surgeon: Kate Sable, MD;  Location: [facility] ORS;  Service: Cardiovascular;  Laterality: N/A;   CARDIOVERSION N/A 09/29/2020   Procedure: CARDIOVERSION;  Surgeon: Wellington Hampshire, MD;  Location:  [place] [facility];  Service: Cardiovascular;  Laterality: N/A;   CARDIOVERSION N/A 05/08/2021   Procedure: CARDIOVERSION;  Surgeon: Minna Merritts, MD;  Location: [facility] ORS;  Service: Cardiovascular;  Laterality: N/A;   CHOLECYSTECTOMY     COLONOSCOPY  03/05/2013   COLONOSCOPY W/ POLYPECTOMY  03/05/2013   COLONOSCOPY WITH PROPOFOL N/A 10/03/2017   Procedure: COLONOSCOPY WITH PROPOFOL;  Surgeon: Manya Silvas, MD;  Location: [facility] ENDOSCOPY;  Service: Endoscopy;  Laterality: N/A;   ESOPHAGOGASTRODUODENOSCOPY  01/11/2003, 06/25/2015   ESOPHAGOGASTRODUODENOSCOPY (EGD) WITH PROPOFOL N/A 06/25/2015   Procedure: ESOPHAGOGASTRODUODENOSCOPY (EGD) WITH PROPOFOL;  Surgeon: Manya Silvas, MD;  Location: [facility] ENDOSCOPY;  Service: Endoscopy;  Laterality: N/A;   FOOT SURGERY Right    IMPLANTABLE CARDIOVERTER DEFIBRILLATOR (ICD) GENERATOR CHANGE N/A 11/15/2012   Procedure: ICD GENERATOR CHANGE;  Surgeon: Deboraha Sprang, MD;  Location: [facility] CATH LAB;  Service: Cardiovascular;  Laterality: N/A;   JOINT REPLACEMENT  2009   Total Knee Replacement, Left   LEAD REVISION N/A 11/15/2012   Procedure: LEAD REVISION;  Surgeon: Deboraha Sprang, MD;  Location: [facility] CATH LAB;  Service: Cardiovascular;  Laterality: N/A;   PACEMAKER INSERTION  06/30/2005   Medtronic Virtuoso VR ICD - Nonischemic cardiomyopathy potentially related to adriamycin, class 3 congestive failture with a narrow QRS and a negative TDI   TONSILLECTOMY     TUBAL LIGATION      Current Medications: Current Meds  Medication Sig   amiodarone (PACERONE) 200 MG tablet Take 1 tablet (200 mg total) by mouth daily.   apixaban (ELIQUIS) 5 MG TABS tablet TAKE 1 TABLET BY MOUTH TWICE DAILY   Ascorbic Acid (VITAMIN C) 1000 MG tablet Take 1,000 mg by mouth in the morning.   Calcium Carb-Cholecalciferol (CALCIUM 600 + D PO) Take 1 tablet by mouth in the morning.   Calcium Polycarbophil (FIBER-CAPS PO) Take 1 capsule by mouth 2 (two) times daily.    chlorpheniramine-HYDROcodone (TUSSIONEX) 10-8 MG/5ML Take 8 mg of hydrocodone by mouth daily.   CINNAMON PO Take 1,000 mg by mouth in the morning.   denosumab (PROLIA) 60 MG/ML SOSY injection Inject 60 mg into the skin every 6 (six) months.   docusate sodium (COLACE) 100 MG capsule Take 300 mg by mouth at bedtime.    DULoxetine (CYMBALTA) 30 MG capsule Take 30 mg by mouth daily.   DULoxetine (CYMBALTA) 60 MG capsule Take 60 mg by mouth daily.   empagliflozin (JARDIANCE) 25 MG TABS tablet Take 25 mg by mouth daily.   gabapentin (NEURONTIN) 600 MG tablet Take 600 mg by mouth 2 (two) times daily.   glimepiride (AMARYL) 2 MG tablet Take 1 tablet (2 mg) by mouth twice daily   levothyroxine (SYNTHROID) 125 MCG tablet TAKE 1 TABLET IN THE MORNING BEFORE BREAKFAST   metolazone (ZAROXOLYN) 5 MG tablet Take 1 tablet (5 mg total) by mouth daily. Take 30 mins prior  to your Torsemide.   Multiple Vitamin (MULTIVITAMIN WITH MINERALS) TABS tablet Take 1 tablet by mouth daily. Centrum Silver   Multiple Vitamins-Minerals (OCUVITE PO) Take 1 tablet by mouth in the morning.   omeprazole (PRILOSEC) 20 MG capsule Take 20 mg by mouth in the morning.   Sodium Fluoride (PREVIDENT 5000 BOOSTER PLUS) 1.1 % PSTE After flossing, brush this paste on all teeth for two full minutes at bedtime, Spit out excess but do not rinse out or eat/drink for 30 min.   [DISCONTINUED] torsemide (DEMADEX) 20 MG tablet Take 3 tablets (60 mg total) by mouth 2 (two) times daily.     Allergies:   Ibuprofen   Social History   Socioeconomic History   Marital status: Widowed    Spouse name: Not on file   Number of children: Not on file   Years of education: Not on file   Highest education level: Not on file  Occupational History   Occupation: Full time    Employer: [employer]  Tobacco Use   Smoking status: Never   Smokeless tobacco: Never  Vaping Use   Vaping Use: Never used  Substance and Sexual Activity   Alcohol use: No   Drug  use: No   Sexual activity: Not on file  Other Topics Concern   Not on file  Social History Narrative   Married   Does not get regular exercise   Social Determinants of Health   Financial Resource Strain: Not on file  Food Insecurity: Not on file  Transportation Needs: Not on file  Physical Activity: Not on file  Stress: Not on file  Social Connections: Not on file     Family History: The patient's family history includes Breast cancer (age of onset: 58) in her sister; Prostate cancer in her father.  ROS:   Please see the history of present illness.     All other systems reviewed and are negative.  EKGs/Labs/Other Studies Reviewed:    The following studies were reviewed today:   EKG:  EKG is  ordered today.  The ekg ordered today demonstrates sinus tachycardia heart rate 105  Recent Labs: 05/07/2021: Hemoglobin 13.1; Platelets 136 05/19/2021: ALT 13; BNP 1,014.7; TSH 8.150 01/26/2022: BUN 31; Creatinine, Ser 2.41; Potassium 3.7; Sodium 136  Recent Lipid Panel No results found for: "CHOL", "TRIG", "HDL", "CHOLHDL", "VLDL", "LDLCALC", "LDLDIRECT"   Risk Assessment/Calculations:       Physical Exam:    VS:  BP 98/72 (BP Location: Left Arm, Patient Position: Sitting, Cuff Size: Normal)   Pulse (!) 105   Wt 209 lb (94.8 kg)   SpO2 96%   BMI 42.21 kg/m     Wt Readings from Last 3 Encounters:  01/26/22 209 lb (94.8 kg)  01/22/22 213 lb 6.4 oz (96.8 kg)  01/05/22 217 lb 6.4 oz (98.6 kg)     GEN:  Well nourished, well developed in no acute distress HEENT: Normal NECK: No JVD; No carotid bruits CARDIAC: Regular rate and rhythm, no murmurs RESPIRATORY: Decreased breath sounds, no wheezing ABDOMEN: Soft, non-tender, distended MUSCULOSKELETAL:  No edema; No deformity  SKIN: Warm and dry NEUROLOGIC:  Alert and oriented x 3 PSYCHIATRIC:  Normal affect   ASSESSMENT:    1. PAF (paroxysmal atrial fibrillation) ([place])   2. NICM (nonischemic cardiomyopathy) ([place])   3.  ICD (implantable cardioverter-defibrillator) in place   4. SOB (shortness of breath)    PLAN:    In order of problems listed above:  Paroxysmal atrial fibrillation,  S/p DCCV (02/13/2020,05/2021 )maintaining sinus rhythm on amiodarone, CHA2DS2-VASc of 5.  Continue amiodarone 200 mg daily, Eliquis 5 mg twice daily.  Maintaining sinus rhythm helps prevent worsening of heart failure exacerbation. Nonischemic cardiomyopathy, last EF 25%.  BP running low.  Still has shortness of breath, occasional cough, abdominal distention.  OptiVol threshold improving, still elevated.  Start metolazone 5 mg daily, take torsemide 40 mg twice daily.  Refer patient to advanced heart failure team.  Continue to hold GDMT due to low blood pressures.  Check BMP in 5 days, follow-up in 1 week with myself.   ICD, keep appointment with device clinic for checks Shortness of breath, obtain chest x-ray, diuretics as above.  Follow-up in 1 week.  Medication Adjustments/Labs and Tests Ordered: Current medicines are reviewed at length with the patient today.  Concerns regarding medicines are outlined above.  Orders Placed This Encounter  Procedures   DG Chest 2 View   Basic metabolic panel   AMB referral to CHF clinic   EKG 12-Lead   Meds ordered this encounter  Medications   torsemide (DEMADEX) 20 MG tablet    Sig: Take 2 tablets (40 mg total) by mouth 2 (two) times daily.    Dispense:  180 tablet    Refill:  3   metolazone (ZAROXOLYN) 5 MG tablet    Sig: Take 1 tablet (5 mg total) by mouth daily. Take 30 mins prior to your Torsemide.    Dispense:  30 tablet    Refill:  3    Patient Instructions  Medication Instructions:   Your physician has recommended you make the following change in your medication:    START taking Metolazone 5 MG once a day, 30 mins prior to your Torsemide dose.  2.    DECREASE your Torsemide to 40 MG twice a day.   *If you need a refill on your cardiac medications before your next  appointment, please call your pharmacy*   Lab Work:  Please get a BMP lab draw prior to your appointment on Tues. 02/02/22 at the [facility].    Testing/Procedures:  Please go to the [facility] imaging center for a Chest Xray after your appointment today.  [facility] [address] [place], [place] [address] [phone]    Follow-Up: At [facility], you and your health needs are our priority.  As part of our continuing mission to provide you with exceptional heart care, we have created designated Provider Care Teams.  These Care Teams include your primary Cardiologist (physician) and Advanced Practice Providers (APPs -  Physician Assistants and Nurse Practitioners) who all work together to provide you with the care you need, when you need it.  We recommend signing up for the patient portal called "MyChart".  Sign up information is provided on this After Visit Summary.  MyChart is used to connect with patients for Virtual Visits (Telemedicine).  Patients are able to view lab/test results, encounter notes, upcoming appointments, etc.  Non-urgent messages can be sent to your provider as well.   To learn more about what you can do with MyChart, go to [email or web address].    Your next appointment:      Tuesday 02/02/22 at 9:00  The format for your next appointment:   In Person  Provider:   Kate Sable, MD    Other Instructions    Important Information About Sugar         Signed, Kate Sable, MD  01/26/2022 10:22 AM  [facility]

## 2022-01-27 ENCOUNTER — Encounter: Payer: Self-pay | Admitting: Internal Medicine

## 2022-01-27 ENCOUNTER — Ambulatory Visit: Payer: Medicare HMO | Attending: Internal Medicine | Admitting: Internal Medicine

## 2022-01-27 DIAGNOSIS — N1832 Chronic kidney disease, stage 3b: Secondary | ICD-10-CM | POA: Diagnosis not present

## 2022-01-27 DIAGNOSIS — I5023 Acute on chronic systolic (congestive) heart failure: Secondary | ICD-10-CM | POA: Diagnosis not present

## 2022-01-27 DIAGNOSIS — I480 Paroxysmal atrial fibrillation: Secondary | ICD-10-CM | POA: Diagnosis not present

## 2022-01-27 MED ORDER — FUROSCIX 80 MG/10ML ~~LOC~~ CTKT: 80.0000 mg | CARTRIDGE | SUBCUTANEOUS | Status: AC

## 2022-01-27 MED ORDER — POTASSIUM CHLORIDE CRYS ER 20 MEQ PO TBCR: 20.0000 meq | EXTENDED_RELEASE_TABLET | ORAL | 0 refills | Status: DC

## 2022-01-27 NOTE — Patient Instructions (Signed)
Your provider has order Furoscix for you. This is an on-body infuser that gives you a dose of Furosemide.   It will be shipped to your home   [employer] will call you to discuss before shipping so, PLEASE answer unknown calls  For questions regarding the device call [employer]uro[employer] at [phone]  Ensure you write down the time you start your infusion so that if there is a problem you will know how long the infusion lasted  Use Furoscix only AS DIRECTED by our office  Dosing Directions:   Day 1= use 1 kit TODAY and take 20 meq of Potassium  Day 2= use 1 kit in AM and 1 kit in PM tomorrow Katherine Rosales 10/26), take 20 meq of Potassium with each dose   We will call you on Friday for an update and further instructions

## 2022-01-27 NOTE — Progress Notes (Signed)
[facility] CONSULT NOTE  Referring Physician:Steven [name], MD  Primary Care: Dion Body, MD Primary Cardiologist: [name], MD   HPI:  Katherine Rosales is a 71 y.o. female with a hx of obesity, HTN, DM2, CKD IIIB-IV, OSA, systolic HF due to NICM s/p ICD (medtronic), last EF 30-35%, parox A. Fib s/p DCCV (02/13/2020, 05/2021 ) and h/o breast CA s/p chemo/XRT who is referred by Dr. [name] for further evaluation of her HF.    HF dates back to at least 2014. Had remote cath with non-obstructive CAD but I cannot find record of it in our system. 12/2018 Echo: EF 30-35%; d. 08/2020   Most recent Echo (5/22): EF 20-25%, glob HK. Mod elev RVSP. Mod dil LA, mild to mod RAE. Mod MR/TR. Mild-mod AoV sclerosis.  Here with her daughter. Now living with her daughter x 2 months. Up until 2 weeks ago was relatively stable NYHA III could do all her ADLs. Over the last 2 weeks. More SOB. Coughing up brown stuff. No fevers or chills. Now SOB with any activity. Can barely get around the house. Tested for COVID/RSV/flu and negative. Treated with amoxicillin by PCP. Coreg, losartan, Aldactone all recently stopped due to low blood pressures.  Saw Dr. Garen Lah yesterday for progressive SOB and cough. Optivol markedly elevated. CXR with effusions and pulmonary edema. Started metolazone which she took this am without much result.   Here with her daughter. Now living with her daughter x 2 months. Up until 2 weeks ago could do all her ADLs. Over the last 2 weeks. More SOB. Coughing up brown stuff. No fevers or chills. Now SOB with any activity. Can barely get around the house. Tested for COVID/RSV/flu and negative. Treated with amoxicillin   ICD: No VT. Optivol way up. Activity level typicaly ~ 2hr/day now < 1hr. No AF Personally reviewed  Review of Systems: [y] = yes, [ ]  = no   General: Weight gain [ y]; Weight loss [ ] ; Anorexia [ ] ; Fatigue Blue.Reese ]; Fever [ ] ; Chills [ ] ; Weakness [ ]    Cardiac: Chest pain/pressure [ ] ; Resting SOB [ y]; Exertional SOB [ y]; Orthopnea Blue.Reese ]; Pedal Edema Blue.Reese ]; Palpitations [ ] ; Syncope [ ] ; Presyncope [ ] ; Paroxysmal nocturnal dyspnea[y ]  Pulmonary: Cough [ y]; Wheezing[ ] ; Hemoptysis[ ] ; Sputum [ y]; Snoring [ ]   GI: Vomiting[ ] ; Dysphagia[ ] ; Melena[ ] ; Hematochezia [ ] ; Heartburn[ ] ; Abdominal pain [ ] ; Constipation [ ] ; Diarrhea [ ] ; BRBPR [ ]   GU: Hematuria[ ] ; Dysuria [ ] ; Nocturia[ ]   Vascular: Pain in legs with walking [ ] ; Pain in feet with lying flat [ ] ; Non-healing sores [ ] ; Stroke [ ] ; TIA [ ] ; Slurred speech [ ] ;  Neuro: Headaches[ y]; Vertigo[ ] ; Seizures[ ] ; Paresthesias[ ] ;Blurred vision [ ] ; Diplopia [ ] ; Vision changes [ ]   Ortho/Skin: Arthritis [ y]; Joint pain Blue.Reese ]; Muscle pain [ ] ; Joint swelling [ ] ; Back Pain [ ] ; Rash [ ]   Psych: Depression[ y]; Anxiety[ y]  Heme: Bleeding problems [ ] ; Clotting disorders [ ] ; Anemia [ y]  Endocrine: Diabetes Blue.Reese ]; Thyroid dysfunction[ ]    Past Medical History:  Diagnosis Date   6949 Lead-Medtronic    Anemia 11/12/2016   Anxiety 12/25/2013   Automatic implantable cardioverter-defibrillator in situ    Breast cancer ([place]) 03/1995   right breast ca with lumpectomy and chemo and rad   Cardiomyopathy, nonischemic ([place])    a.  Prior cath w/ nonobs CAD; b. s/p AICD w/ subsequent upgrade 11/2012-> MDT Evera XT VR single lead ICD (ser # [id number]); c. 12/2018 Echo: EF 30-35%; d. 08/2020 Echo: EF 20-25%, glob HK. Mod elev RVSP. Mod dil LA, mild to mod RAE. Mod MR/TR. Mild-mod AoV sclerosis.   Chronic HFrEF (heart failure with reduced ejection fraction) ([place])    a. 12/2018 Echo: EF 30-35%; b. 08/2020 Echo: EF 20-25%, glob HK.   CKD (chronic kidney disease), stage III ([place]) 11/12/2016   Depression    Diabetes mellitus    Type II Diabetes   Hypertension    Hypothyroidism    ICD (implantable cardiac defibrillator), singleMedtronic    NASH (nonalcoholic steatohepatitis)    OSA (obstructive  sleep apnea)    a. On CPAP.   PAF (paroxysmal atrial fibrillation) ([place])    a. 09/2020 s/p DCCV.   Personal history of chemotherapy 1996   right breast ca   Personal history of radiation therapy 1996   right breast ca   PVC's (premature ventricular contractions)    a. 08/2020 Zio: 8.1% PVC burden.   Sleep apnea    On CPAP   Spleen enlarged    Systolic heart failure, chronic ([facility])     Current Outpatient Medications  Medication Sig Dispense Refill   amiodarone (PACERONE) 200 MG tablet Take 1 tablet (200 mg total) by mouth daily. 30 tablet 2   amoxicillin-clavulanate (AUGMENTIN) 875-125 MG tablet SMARTSIG:1 Tablet(s) By Mouth Every 12 Hours     apixaban (ELIQUIS) 5 MG TABS tablet TAKE 1 TABLET BY MOUTH TWICE DAILY 60 tablet 6   Ascorbic Acid (VITAMIN C) 1000 MG tablet Take 1,000 mg by mouth in the morning.     Calcium Carb-Cholecalciferol (CALCIUM 600 + D PO) Take 1 tablet by mouth in the morning.     Calcium Polycarbophil (FIBER-CAPS PO) Take 1 capsule by mouth 2 (two) times daily.     cetirizine (ZYRTEC) 10 MG tablet Take 10 mg by mouth daily.     chlorpheniramine-HYDROcodone (TUSSIONEX) 10-8 MG/5ML Take 8 mg of hydrocodone by mouth daily.     CINNAMON PO Take 1,000 mg by mouth in the morning.     denosumab (PROLIA) 60 MG/ML SOSY injection Inject 60 mg into the skin every 6 (six) months.     docusate sodium (COLACE) 100 MG capsule Take 300 mg by mouth at bedtime.      DULoxetine (CYMBALTA) 30 MG capsule Take 30 mg by mouth daily.     DULoxetine (CYMBALTA) 60 MG capsule Take 60 mg by mouth daily.     empagliflozin (JARDIANCE) 25 MG TABS tablet Take 25 mg by mouth daily.     fluticasone (FLONASE) 50 MCG/ACT nasal spray Place 2 sprays into both nostrils at bedtime.     gabapentin (NEURONTIN) 600 MG tablet Take 600 mg by mouth 2 (two) times daily.     glimepiride (AMARYL) 2 MG tablet Take 1 tablet (2 mg) by mouth twice daily     levothyroxine (SYNTHROID) 125 MCG tablet TAKE 1 TABLET IN  THE MORNING BEFORE BREAKFAST 90 tablet 3   metolazone (ZAROXOLYN) 5 MG tablet Take 1 tablet (5 mg total) by mouth daily. Take 30 mins prior to your Torsemide. 30 tablet 3   Multiple Vitamin (MULTIVITAMIN WITH MINERALS) TABS tablet Take 1 tablet by mouth daily. Centrum Silver     Multiple Vitamins-Minerals (OCUVITE PO) Take 1 tablet by mouth in the morning.     omeprazole (PRILOSEC) 20 MG capsule Take  20 mg by mouth in the morning.     Sodium Fluoride (PREVIDENT 5000 BOOSTER PLUS) 1.1 % PSTE After flossing, brush this paste on all teeth for two full minutes at bedtime, Spit out excess but do not rinse out or eat/drink for 30 min.     torsemide (DEMADEX) 20 MG tablet Take 2 tablets (40 mg total) by mouth 2 (two) times daily. 180 tablet 3   No current facility-administered medications for this visit.    Allergies  Allergen Reactions   Ibuprofen Other (See Comments)    Avoid due to stage 3 kidney disease        Social History   Socioeconomic History   Marital status: Widowed    Spouse name: Not on file   Number of children: Not on file   Years of education: Not on file   Highest education level: Not on file  Occupational History   Occupation: Full time    Employer: [employer]  Tobacco Use   Smoking status: Never   Smokeless tobacco: Never  Vaping Use   Vaping Use: Never used  Substance and Sexual Activity   Alcohol use: No   Drug use: No   Sexual activity: Not on file  Other Topics Concern   Not on file  Social History Narrative   Married   Does not get regular exercise   Social Determinants of Health   Financial Resource Strain: Not on file  Food Insecurity: Not on file  Transportation Needs: Not on file  Physical Activity: Not on file  Stress: Not on file  Social Connections: Not on file  Intimate Partner Violence: Not on file      Family History  Problem Relation Age of Onset   Prostate cancer Father    Breast cancer Sister 74    Vitals:   01/27/22 1355   BP: 118/74  Pulse: 95  SpO2: 98%  Weight: 213 lb 6 oz (96.8 kg)  Height: 4' 11"  (1.499 m)   Wt Readings from Last 3 Encounters:  01/27/22 213 lb 6 oz (96.8 kg)  01/26/22 209 lb (94.8 kg)  01/22/22 213 lb 6.4 oz (96.8 kg)    PHYSICAL EXAM: General:  Ill appearing. Weak. Coughing up foamy brown sputum HEENT: normal Neck: supple.JVP up  Carotids 2+ bilat; no bruits. No lymphadenopathy or thryomegaly appreciated. Cor: PMI nondisplaced. Regular rate & rhythm. No rubs, gallops or murmurs. Lungs: + coarse decreased at bases Abdomen: obese soft, nontender, nondistended. No hepatosplenomegaly. No bruits or masses. Good bowel sounds. Extremities: no cyanosis, clubbing, rash, 1-2+ edema Neuro: alert & oriented x 3, cranial nerves grossly intact. moves all 4 extremities w/o difficulty. Affect pleasant.  ECG: Sinus 77 1AVB (283m) atypical LBBB (164 ms) Personally reviewed   ASSESSMENT & PLAN:  1. Acute on chronic systolic HF - due to NICM. Dates back to 2104. Remote cath non-obstructive CAD - Etiology unclear (?IVCD/LBBB) - Echo 5/22 EF 20-25% - At baseline NYHA II-III - Now NYHA IV - Volume up on Optivol. CXR with edema. Recent Scr rising - Concern for low output. I discussed hospital admission with her and her daughter for IV diuresis and probable inotrope support. They would like to avoid if possible. We will start Furoscix and assess response. If no response overnight suggest they go to ER for admission. If responding, will treat bid x 2 days and see her back early next week - Continue to hold losartan, carvedilol and aldactone - Continue Jardiance - ICD interrogated in clinic:  results as above (no VT, volume up) - With wide QRs would she benefit from CRT upgrade??  2. PAF - in NSR today - continue amio  - Suspect current lung issues related to HF but need to keep amio toxicity of differential - Continue Eliquis  3. AKI on CKD IIIB-IV - baseline Scr 1.8-2.0 - yesterday 2.4.  Suspect cardiorenal - follow closely  4. OSA  - continue CPAP  5. DM2  - per PCP  Total time spent 45 minutes. Over half that time spent discussing above.   Glori Bickers, MD  7:21 PM

## 2022-01-27 NOTE — Progress Notes (Signed)
Provided patient education on Furoscix using demo kits and Furoscix video, QR code provided on AVS for further viewing. Furoscix order form completed and signed by Dr Haroldine Laws. Order form, ins info, & OV note all faxed into [employer] [facility].  Medication Samples have been provided to the patient.  Drug name: Furoscix       Strength: 23m        Qty: 3  LOT:: [id number] Exp.Date: 01/03/23  Dosing instructions: Use as directed  The patient has been instructed regarding the correct time, dose, and frequency of taking this medication, including desired effects and most common side effects.   Katherine Rosales 2:47 PM 01/27/2022

## 2022-01-28 ENCOUNTER — Telehealth (HOSPITAL_COMMUNITY): Payer: Self-pay | Admitting: *Deleted

## 2022-01-28 ENCOUNTER — Encounter: Payer: Self-pay | Admitting: Internal Medicine

## 2022-01-28 NOTE — Telephone Encounter (Signed)
Pt ordered Furoscix 10/25, called pt to f/u on how she is doing. Per niece Clarene Critchley) pt's wt is down about 8 lbs since yesterday, her SOB is improved, and she is overall feeling better. Pt has used 2 kits and will use the 3rd this afternoon, Dr Haroldine Laws is aware

## 2022-01-29 ENCOUNTER — Telehealth (HOSPITAL_COMMUNITY): Payer: Self-pay | Admitting: *Deleted

## 2022-01-29 NOTE — Telephone Encounter (Signed)
Called pt's niece to f/u on how pt is feeling. She states pt wt is down to 200 lbs today (was 214 10/25), pt is feeling much better and moving around the house without dyspnea. Pt has used 3 Furoscix kit and is now taking Torsemide 40 mg BID, advised to hold Metolazone for now. Pt sch for f/u 10/31, if wt increasing or sxs return/worsen before then she will [place] back.

## 2022-01-29 NOTE — Telephone Encounter (Signed)
Noted- that's awesome!

## 2022-02-02 ENCOUNTER — Other Ambulatory Visit (HOSPITAL_COMMUNITY): Payer: Self-pay | Admitting: *Deleted

## 2022-02-02 ENCOUNTER — Ambulatory Visit (HOSPITAL_BASED_OUTPATIENT_CLINIC_OR_DEPARTMENT_OTHER): Payer: Medicare HMO | Admitting: Internal Medicine

## 2022-02-02 ENCOUNTER — Encounter: Payer: Self-pay | Admitting: Internal Medicine

## 2022-02-02 ENCOUNTER — Telehealth: Payer: Self-pay | Admitting: Cardiology

## 2022-02-02 ENCOUNTER — Other Ambulatory Visit
Admission: RE | Admit: 2022-02-02 | Discharge: 2022-02-02 | Disposition: A | Discharge status: Home / Self Care | Payer: Medicare HMO | Source: Ambulatory Visit | Attending: Cardiology | Admitting: Cardiology

## 2022-02-02 ENCOUNTER — Ambulatory Visit
Admission: RE | Admit: 2022-02-02 | Discharge: 2022-02-02 | Disposition: A | Discharge status: Home / Self Care | Payer: Medicare HMO | Source: Ambulatory Visit | Attending: Cardiology | Admitting: Cardiology

## 2022-02-02 ENCOUNTER — Ambulatory Visit: Payer: Medicare HMO | Admitting: Cardiology

## 2022-02-02 VITALS — BP 100/65 | HR 84 | Resp 18 | Ht 59.0 in | Wt 195.1 lb

## 2022-02-02 DIAGNOSIS — Z7901 Long term (current) use of anticoagulants: Secondary | ICD-10-CM | POA: Diagnosis not present

## 2022-02-02 DIAGNOSIS — I5022 Chronic systolic (congestive) heart failure: Secondary | ICD-10-CM

## 2022-02-02 DIAGNOSIS — J40 Bronchitis, not specified as acute or chronic: Secondary | ICD-10-CM | POA: Diagnosis not present

## 2022-02-02 DIAGNOSIS — Z79899 Other long term (current) drug therapy: Secondary | ICD-10-CM | POA: Diagnosis not present

## 2022-02-02 DIAGNOSIS — N1832 Chronic kidney disease, stage 3b: Secondary | ICD-10-CM | POA: Diagnosis not present

## 2022-02-02 DIAGNOSIS — I428 Other cardiomyopathies: Secondary | ICD-10-CM

## 2022-02-02 DIAGNOSIS — E876 Hypokalemia: Secondary | ICD-10-CM | POA: Insufficient documentation

## 2022-02-02 DIAGNOSIS — Z7984 Long term (current) use of oral hypoglycemic drugs: Secondary | ICD-10-CM | POA: Diagnosis not present

## 2022-02-02 DIAGNOSIS — N179 Acute kidney failure, unspecified: Secondary | ICD-10-CM

## 2022-02-02 DIAGNOSIS — G4733 Obstructive sleep apnea (adult) (pediatric): Secondary | ICD-10-CM | POA: Insufficient documentation

## 2022-02-02 DIAGNOSIS — E1122 Type 2 diabetes mellitus with diabetic chronic kidney disease: Secondary | ICD-10-CM | POA: Diagnosis not present

## 2022-02-02 DIAGNOSIS — I2510 Atherosclerotic heart disease of native coronary artery without angina pectoris: Secondary | ICD-10-CM | POA: Diagnosis not present

## 2022-02-02 DIAGNOSIS — I480 Paroxysmal atrial fibrillation: Secondary | ICD-10-CM | POA: Insufficient documentation

## 2022-02-02 DIAGNOSIS — I130 Hypertensive heart and chronic kidney disease with heart failure and stage 1 through stage 4 chronic kidney disease, or unspecified chronic kidney disease: Secondary | ICD-10-CM | POA: Insufficient documentation

## 2022-02-02 DIAGNOSIS — Z95810 Presence of automatic (implantable) cardiac defibrillator: Secondary | ICD-10-CM

## 2022-02-02 LAB — BASIC METABOLIC PANEL
Anion gap: 15 (ref 5–15)
BUN: 69 mg/dL — ABNORMAL HIGH (ref 8–23)
CO2: 34 mmol/L — ABNORMAL HIGH (ref 22–32)
Calcium: 12.4 mg/dL — ABNORMAL HIGH (ref 8.9–10.3)
Chloride: 78 mmol/L — ABNORMAL LOW (ref 98–111)
Creatinine, Ser: 2.63 mg/dL — ABNORMAL HIGH (ref 0.44–1.00)
GFR, Estimated: 19 mL/min — ABNORMAL LOW (ref >60)
Glucose, Bld: 190 mg/dL — ABNORMAL HIGH (ref 70–99)
Potassium: 2.5 mmol/L — CL (ref 3.5–5.1)
Sodium: 127 mmol/L — ABNORMAL LOW (ref 135–145)

## 2022-02-02 MED ORDER — SODIUM CHLORIDE 0.9 % IV BOLUS
1000.0000 mL | Freq: Once | INTRAVENOUS | Status: AC
  Administered 2022-02-02: 1000 mL via INTRAVENOUS

## 2022-02-02 MED ORDER — POTASSIUM CHLORIDE CRYS ER 20 MEQ PO TBCR: EXTENDED_RELEASE_TABLET | ORAL | 3 refills | Status: AC

## 2022-02-02 MED ORDER — TORSEMIDE 20 MG PO TABS: 40.0000 mg | ORAL_TABLET | Freq: Every day | ORAL | 3 refills | Status: AC

## 2022-02-02 NOTE — Telephone Encounter (Signed)
Late entry from 11:30 AM today: Spoke with Katherine Rosales (Dr. Bensimhon's RN) and patient is going to be seen by them today and they will be addressing patients labs and low potassium during office visit.

## 2022-02-02 NOTE — Addendum Note (Signed)
Addended by: Scarlette Calico on: 02/02/2022 03:19 PM   Modules accepted: Orders

## 2022-02-02 NOTE — Patient Instructions (Addendum)
Medication Changes:  Take Potassium 40 meq (2 tabs) Three times a day TODAY ONLY, then take 20 meq (1 tab) Daily when you start back on Torsemide  HOLD Torsemide until wt is 200 lbs or greater then start Torsemide 40 mg (2 tabs) Daily, **IF wt gets to 206 lbs or greater increase to 40 mg Twice daily   Lab Work:  Your physician recommends that you return for lab work in: 1 week   Testing/Procedures:  none  Referrals:  none  Special Instructions // Education:  Do the following things EVERYDAY: Weigh yourself in the morning before breakfast. Write it down and keep it in a log. Take your medicines as prescribed Eat low salt foods--Limit salt (sodium) to 2000 mg per day.  Stay as active as you can everyday Limit all fluids for the day to less than 2 liters   Follow-Up in: 1 week    If you have any questions or concerns before your next appointment please send [place] a message through [place] or call our office at [phone]

## 2022-02-02 NOTE — Telephone Encounter (Signed)
CRITICAL VALUE STICKER  CRITICAL VALUE: Potassium 2.5  RECEIVER (on-site recipient of call): Katherine Rosales   DATE & TIME NOTIFIED: 02/02/22 at 11:01 am  MESSENGER (representative from lab): Katherine Idler  MD: Dr. Garen Lah RESPONSE: Forwarding to nurse and provider and will review with them in person.

## 2022-02-02 NOTE — Progress Notes (Signed)
Patient completed IV fluid infusion.  Tolerated well.  BP low.  Patient has an appointment with PCP at [facility], taken to office via wheelchair with daughter in attendance.

## 2022-02-02 NOTE — Telephone Encounter (Signed)
Calling with critical lab results

## 2022-02-02 NOTE — Progress Notes (Signed)
[place] [facility]  NOTE  Referring Physician:Steven [name], MD  Primary Care: Dion Body, MD Primary Cardiologist: [name], MD   HPI:  Katherine Rosales is a 71 y.o. female with a hx of obesity, HTN, DM2, CKD IIIB-IV, OSA, systolic HF due to NICM s/p ICD (medtronic), last EF 30-35%, parox A. Fib s/p DCCV (02/13/2020, 05/2021 ) and h/o breast CA s/p chemo/XRT who is referred by Dr. [name] for further evaluation of her HF.    HF dates back to at least 2014. Had remote cath with non-obstructive CAD but I cannot find record of it in our system. 12/2018 Echo: EF 30-35%; d. 08/2020   Most recent Echo (5/22): EF 20-25%, glob HK. Mod elev RVSP. Mod dil LA, mild to mod RAE. Mod MR/TR. Mild-mod AoV sclerosis.  Here with her daughter. Now living with her daughter x 2 months. Up until 2 weeks ago was relatively stable NYHA III could do all her ADLs. Over the last 2 weeks. More SOB. Coughing up brown stuff. No fevers or chills. Now SOB with any activity. Can barely get around the house. Tested for COVID/RSV/flu and negative. Treated with amoxicillin by PCP. Coreg, losartan, Aldactone all recently stopped due to low blood pressures.  I saw her last week with marked volume overloaded and NYHA IV symptoms. Not responding to increase oral diuretics. Started on Furoscix. Returns today with her daughter. Has lost 20 pounds of fluid. Breathing much better but very weak. Feels orthostatic. No syncope. No orthopnea or PND. Edema has resolved.   Labs from today K 2.5 Scr 1.9 -> 2.4 -> 2.6  ICD: Optivol way down (now flat). No VT Personally reviewed     Past Medical History:  Diagnosis Date   6949 Lead-Medtronic    Anemia 11/12/2016   Anxiety 12/25/2013   Automatic implantable cardioverter-defibrillator in situ    Breast cancer ([place]) 03/1995   right breast ca with lumpectomy and chemo and rad   Cardiomyopathy, nonischemic ([place])    a. Prior cath w/ nonobs CAD; b. s/p AICD w/ subsequent  upgrade 11/2012-> MDT Evera XT VR single lead ICD (ser # [id number]); c. 12/2018 Echo: EF 30-35%; d. 08/2020 Echo: EF 20-25%, glob HK. Mod elev RVSP. Mod dil LA, mild to mod RAE. Mod MR/TR. Mild-mod AoV sclerosis.   Chronic HFrEF (heart failure with reduced ejection fraction) ([place])    a. 12/2018 Echo: EF 30-35%; b. 08/2020 Echo: EF 20-25%, glob HK.   CKD (chronic kidney disease), stage III ([place]) 11/12/2016   Depression    Diabetes mellitus    Type II Diabetes   Hypertension    Hypothyroidism    ICD (implantable cardiac defibrillator), singleMedtronic    NASH (nonalcoholic steatohepatitis)    OSA (obstructive sleep apnea)    a. On CPAP.   PAF (paroxysmal atrial fibrillation) ([place])    a. 09/2020 s/p DCCV.   Personal history of chemotherapy 1996   right breast ca   Personal history of radiation therapy 1996   right breast ca   PVC's (premature ventricular contractions)    a. 08/2020 Zio: 8.1% PVC burden.   Sleep apnea    On CPAP   Spleen enlarged    Systolic heart failure, chronic (H[facility])     Current Outpatient Medications  Medication Sig Dispense Refill   amiodarone (PACERONE) 200 MG tablet Take 1 tablet (200 mg total) by mouth daily. 30 tablet 2   apixaban (ELIQUIS) 5 MG TABS tablet TAKE 1 TABLET BY MOUTH TWICE  DAILY 60 tablet 6   Ascorbic Acid (VITAMIN C) 1000 MG tablet Take 1,000 mg by mouth in the morning.     Calcium Carb-Cholecalciferol (CALCIUM 600 + D PO) Take 1 tablet by mouth in the morning.     Calcium Polycarbophil (FIBER-CAPS PO) Take 1 capsule by mouth 2 (two) times daily.     cetirizine (ZYRTEC) 10 MG tablet Take 10 mg by mouth daily.     chlorpheniramine-HYDROcodone (TUSSIONEX) 10-8 MG/5ML Take 8 mg of hydrocodone by mouth daily.     CINNAMON PO Take 1,000 mg by mouth in the morning.     denosumab (PROLIA) 60 MG/ML SOSY injection Inject 60 mg into the skin every 6 (six) months.     dextromethorphan-guaiFENesin (MUCINEX DM) 30-600 MG 12hr tablet Take 1 tablet by mouth 2  (two) times daily.     docusate sodium (COLACE) 100 MG capsule Take 300 mg by mouth at bedtime.      DULoxetine (CYMBALTA) 30 MG capsule Take 30 mg by mouth daily.     DULoxetine (CYMBALTA) 60 MG capsule Take 60 mg by mouth daily.     empagliflozin (JARDIANCE) 25 MG TABS tablet Take 25 mg by mouth daily.     fluticasone (FLONASE) 50 MCG/ACT nasal spray Place 2 sprays into both nostrils at bedtime.     Furosemide (FUROSCIX) 80 MG/10ML CTKT Inject 80 mg into the skin as directed.     gabapentin (NEURONTIN) 600 MG tablet Take 600 mg by mouth 2 (two) times daily.     glimepiride (AMARYL) 2 MG tablet Take 1 tablet (2 mg) by mouth twice daily     levothyroxine (SYNTHROID) 125 MCG tablet TAKE 1 TABLET IN THE MORNING BEFORE BREAKFAST 90 tablet 3   metolazone (ZAROXOLYN) 5 MG tablet Take 5 mg by mouth daily as needed (for swelling). Take 30 minutes before Torsemide     Multiple Vitamin (MULTIVITAMIN WITH MINERALS) TABS tablet Take 1 tablet by mouth daily. Centrum Silver     Multiple Vitamins-Minerals (OCUVITE PO) Take 1 tablet by mouth in the morning.     omeprazole (PRILOSEC) 20 MG capsule Take 20 mg by mouth in the morning.     potassium chloride SA (KLOR-CON M) 20 MEQ tablet Take 2 tablets (40 mEq total) by mouth 3 (three) times daily for 1 day, THEN 1 tablet (20 mEq total) daily. 90 tablet 3   Sodium Fluoride (PREVIDENT 5000 BOOSTER PLUS) 1.1 % PSTE After flossing, brush this paste on all teeth for two full minutes at bedtime, Spit out excess but do not rinse out or eat/drink for 30 min.     torsemide (DEMADEX) 20 MG tablet Take 2 tablets (40 mg total) by mouth daily. Increase to 40 mg BID for wt 206 or greater 180 tablet 3   No current facility-administered medications for this visit.    Allergies  Allergen Reactions   Ibuprofen Other (See Comments)    Avoid due to stage 3 kidney disease        Social History   Socioeconomic History   Marital status: Widowed    Spouse name: Not on file    Number of children: Not on file   Years of education: Not on file   Highest education level: Not on file  Occupational History   Occupation: Full time    Employer: [employer]  Tobacco Use   Smoking status: Never   Smokeless tobacco: Never  Vaping Use   Vaping Use: Never used  Substance and Sexual  Activity   Alcohol use: No   Drug use: No   Sexual activity: Not on file  Other Topics Concern   Not on file  Social History Narrative   Married   Does not get regular exercise   Social Determinants of Health   Financial Resource Strain: Not on file  Food Insecurity: Not on file  Transportation Needs: Not on file  Physical Activity: Not on file  Stress: Not on file  Social Connections: Not on file  Intimate Partner Violence: Not on file      Family History  Problem Relation Age of Onset   Prostate cancer Father    Breast cancer Sister 73    Vitals:   02/02/22 1049  BP: 100/65  Pulse: 84  Resp: 18  SpO2: 91%  Weight: 195 lb 2 oz (88.5 kg)  Height: 4' 11"  (1.499 m)   Wt Readings from Last 3 Encounters:  02/02/22 195 lb 2 oz (88.5 kg)  01/27/22 213 lb 6 oz (96.8 kg)  01/26/22 209 lb (94.8 kg)    PHYSICAL EXAM: General:  Weak appearing. No resp difficulty HEENT: normal Neck: supple. no JVD. Carotids 2+ bilat; no bruits. No lymphadenopathy or thryomegaly appreciated. Cor: PMI nondisplaced. Regular rate & rhythm. No rubs, gallops or murmurs. Lungs: clear Abdomen: obese soft, nontender, nondistended. No hepatosplenomegaly. No bruits or masses. Good bowel sounds. Extremities: no cyanosis, clubbing, rash, edema Neuro: alert & orientedx3, cranial nerves grossly intact. moves all 4 extremities w/o difficulty. Affect pleasant  ASSESSMENT & PLAN:  1. Chronic systolic HF - due to NICM. Dates back to 2104. Remote cath non-obstructive CAD - Etiology unclear (?IVCD/LBBB) - Echo 5/22 EF 20-25% - At baseline NYHA II-III -> today NYHA IIIB-IV - Volume status now much  better with 20 pound weight loss with Furoscix. Now appears volume depleted and orthostatic. We discussed increasing po intake but daughter asking about IVF - Have arranged for 1L IVF in clinic and supp K  - ICD interrogated Optivol way down. No VT  - Stop torsemide - Resume torsemide 40 + kcl 20 when weight >= 200 pounds. IF weight hits 206 pounds increase torsemide to 40 bid  - Continue to hold losartan, carvedilol and aldactone - Continue Jardiance - With wide QRs would she benefit from CRT upgrade?? - Once she is back to baseline will need to start reintroducing GDMT and assess more closely to see if she is a candidate for advanced therapies.   2. PAF - in NSR today - continue amio  - Suspect current lung issues related to HF but need to keep amio toxicity of differential - Continue Eliquis  3. AKI on CKD IIIB-IV - baseline Scr 1.8-2.0 - Now 2.4-> 2.6. Suspect cardiorenal/overdiuresis - Will give 1L IVF in clinic and supp K   4. OSA  - continue CPAP  5. DM2  - per PCP  6. Hypokalemia - supp  RTC in 1 week   Total time spent 45 minutes. Over half that time spent discussing above.   Glori Bickers, MD  2:04 PM

## 2022-02-03 ENCOUNTER — Telehealth (HOSPITAL_COMMUNITY): Payer: Self-pay | Admitting: *Deleted

## 2022-02-03 NOTE — Telephone Encounter (Signed)
Called pt's niece for update on how pt is feeling. She states pt is much better today, pt did not weigh today, advised importance of daily weights, she is aware and will again express importance to pt.

## 2022-02-08 ENCOUNTER — Ambulatory Visit (INDEPENDENT_AMBULATORY_CARE_PROVIDER_SITE_OTHER): Payer: Medicare HMO

## 2022-02-08 DIAGNOSIS — I480 Paroxysmal atrial fibrillation: Secondary | ICD-10-CM

## 2022-02-09 ENCOUNTER — Other Ambulatory Visit (HOSPITAL_COMMUNITY): Payer: Self-pay

## 2022-02-09 ENCOUNTER — Telehealth: Payer: Self-pay | Admitting: Internal Medicine

## 2022-02-09 NOTE — Telephone Encounter (Signed)
LVM with patients daughter and patient asking to reschedule appointment with Dr. Jacinto Reap in the [place] office to thurday at 1040 per Heather's Request or to reschedule it to another day and for one of them to call me back as soon as possible.   Katherine Rosales, NT

## 2022-02-10 ENCOUNTER — Ambulatory Visit: Payer: Medicare HMO | Admitting: Internal Medicine

## 2022-02-10 LAB — CUP PACEART REMOTE DEVICE CHECK
Battery Remaining Longevity: 28 mo
Battery Voltage: 2.93 V
Brady Statistic RV Percent Paced: 0 %
Date Time Interrogation Session: 20231106031806
HighPow Impedance: 81 Ohm
Implantable Lead Connection Status: 753985
Implantable Lead Implant Date: 20140813
Implantable Lead Location: 753860
Implantable Lead Model: 6935
Implantable Pulse Generator Implant Date: 20140813
Lead Channel Impedance Value: 323 Ohm
Lead Channel Impedance Value: 380 Ohm
Lead Channel Pacing Threshold Amplitude: 0.75 V
Lead Channel Pacing Threshold Pulse Width: 0.4 ms
Lead Channel Sensing Intrinsic Amplitude: 14.25 mV
Lead Channel Sensing Intrinsic Amplitude: 14.25 mV
Lead Channel Setting Pacing Amplitude: 2 V
Lead Channel Setting Pacing Pulse Width: 0.4 ms
Lead Channel Setting Sensing Sensitivity: 0.3 mV

## 2022-02-11 ENCOUNTER — Ambulatory Visit (HOSPITAL_BASED_OUTPATIENT_CLINIC_OR_DEPARTMENT_OTHER): Payer: Medicare HMO | Admitting: Internal Medicine

## 2022-02-11 ENCOUNTER — Encounter: Payer: Self-pay | Admitting: Internal Medicine

## 2022-02-11 ENCOUNTER — Other Ambulatory Visit
Admission: RE | Admit: 2022-02-11 | Discharge: 2022-02-11 | Disposition: A | Discharge status: Home / Self Care | Payer: Medicare HMO | Attending: Internal Medicine | Admitting: Internal Medicine

## 2022-02-11 VITALS — BP 88/64 | HR 95 | Resp 16 | Wt 203.0 lb

## 2022-02-11 DIAGNOSIS — I480 Paroxysmal atrial fibrillation: Secondary | ICD-10-CM | POA: Diagnosis not present

## 2022-02-11 DIAGNOSIS — I5022 Chronic systolic (congestive) heart failure: Secondary | ICD-10-CM | POA: Diagnosis not present

## 2022-02-11 DIAGNOSIS — N184 Chronic kidney disease, stage 4 (severe): Secondary | ICD-10-CM

## 2022-02-11 DIAGNOSIS — R053 Chronic cough: Secondary | ICD-10-CM | POA: Diagnosis not present

## 2022-02-11 LAB — BASIC METABOLIC PANEL
Anion gap: 16 — ABNORMAL HIGH (ref 5–15)
BUN: 61 mg/dL — ABNORMAL HIGH (ref 8–23)
CO2: 22 mmol/L (ref 22–32)
Calcium: 9.3 mg/dL (ref 8.9–10.3)
Chloride: 94 mmol/L — ABNORMAL LOW (ref 98–111)
Creatinine, Ser: 2.85 mg/dL — ABNORMAL HIGH (ref 0.44–1.00)
GFR, Estimated: 17 mL/min — ABNORMAL LOW (ref >60)
Glucose, Bld: 188 mg/dL — ABNORMAL HIGH (ref 70–99)
Potassium: 4.5 mmol/L (ref 3.5–5.1)
Sodium: 132 mmol/L — ABNORMAL LOW (ref 135–145)

## 2022-02-11 MED ORDER — FUROSEMIDE 20 MG PO TABS
20.0000 mg | ORAL_TABLET | Freq: Once | ORAL | Status: DC
  Filled 2022-02-11: qty 1

## 2022-02-11 NOTE — Progress Notes (Signed)
[place] [facility]  NOTE  Referring Physician:Steven [name], MD  Primary Care: Dion Body, MD Primary Cardiologist: [name], MD   HPI:  Katherine Rosales is a 71 y.o. female with a hx of obesity, HTN, DM2, CKD IIIB-IV, OSA, systolic HF due to NICM s/p ICD (medtronic), last EF 30-35%, parox A. Fib s/p DCCV (02/13/2020, 05/2021 ) and h/o breast CA s/p chemo/XRT who is referred by Dr. [name] for further evaluation of her HF.    HF dates back to at least 2014. Had remote cath with non-obstructive CAD but I cannot find record of it in our system. 12/2018 Echo: EF 30-35%; d. 08/2020   Most recent Echo (5/22): EF 20-25%, glob HK. Mod elev RVSP. Mod dil LA, mild to mod RAE. Mod MR/TR. Mild-mod AoV sclerosis.  Here with her daughter. Now living with her daughter x 2 months. Up until 2 weeks ago was relatively stable NYHA III could do all her ADLs. Over the last 2 weeks. More SOB. Coughing up brown stuff. No fevers or chills. Now SOB with any activity. Can barely get around the house. Tested for COVID/RSV/flu and negative. Treated with amoxicillin by PCP. Coreg, losartan, Aldactone all recently stopped due to low blood pressures.  I saw her 2 weeks ago with marked volume overloaded and NYHA IV symptoms. Not responding to increase oral diuretics. Started on Furoscix. Saw her last week and was down 20 pounds very volume depleted. Weight down to 195. Received IVF.   Returns today with her daughter. Weight ranging 197-201. Takes torsemide 40 when weight 200 or greater. Still dizzy but started back doing laundry and doing dishes. Poor appetite. No CP. Dyspnea with mild activity. Still with cough. Has had 2 different abx augmentin and doxy without relief  ICD: Optivol way down. Now flat.  No VT Activity level decreased from 4hr/day to < 1hr.day Personally reviewed     Past Medical History:  Diagnosis Date   6949 Lead-Medtronic    Anemia 11/12/2016   Anxiety 12/25/2013   Automatic  implantable cardioverter-defibrillator in situ    Breast cancer ([place]) 03/1995   right breast ca with lumpectomy and chemo and rad   Cardiomyopathy, nonischemic ([place])    a. Prior cath w/ nonobs CAD; b. s/p AICD w/ subsequent upgrade 11/2012-> MDT Evera XT VR single lead ICD (ser # [id number]); c. 12/2018 Echo: EF 30-35%; d. 08/2020 Echo: EF 20-25%, glob HK. Mod elev RVSP. Mod dil LA, mild to mod RAE. Mod MR/TR. Mild-mod AoV sclerosis.   Chronic HFrEF (heart failure with reduced ejection fraction) ([place])    a. 12/2018 Echo: EF 30-35%; b. 08/2020 Echo: EF 20-25%, glob HK.   CKD (chronic kidney disease), stage III ([place]) 11/12/2016   Depression    Diabetes mellitus    Type II Diabetes   Hypertension    Hypothyroidism    ICD (implantable cardiac defibrillator), singleMedtronic    NASH (nonalcoholic steatohepatitis)    OSA (obstructive sleep apnea)    a. On CPAP.   PAF (paroxysmal atrial fibrillation) ([place])    a. 09/2020 s/p DCCV.   Personal history of chemotherapy 1996   right breast ca   Personal history of radiation therapy 1996   right breast ca   PVC's (premature ventricular contractions)    a. 08/2020 Zio: 8.1% PVC burden.   Sleep apnea    On CPAP   Spleen enlarged    Systolic heart failure, chronic ([place])     Current Outpatient Medications  Medication  Sig Dispense Refill   amiodarone (PACERONE) 200 MG tablet Take 1 tablet (200 mg total) by mouth daily. 30 tablet 2   apixaban (ELIQUIS) 5 MG TABS tablet TAKE 1 TABLET BY MOUTH TWICE DAILY 60 tablet 6   Ascorbic Acid (VITAMIN C) 1000 MG tablet Take 1,000 mg by mouth in the morning.     Calcium Carb-Cholecalciferol (CALCIUM 600 + D PO) Take 1 tablet by mouth in the morning.     Calcium Polycarbophil (FIBER-CAPS PO) Take 1 capsule by mouth 2 (two) times daily.     cetirizine (ZYRTEC) 10 MG tablet Take 10 mg by mouth daily.     chlorpheniramine-HYDROcodone (TUSSIONEX) 10-8 MG/5ML Take 8 mg of hydrocodone by mouth daily.     CINNAMON PO Take  1,000 mg by mouth in the morning.     denosumab (PROLIA) 60 MG/ML SOSY injection Inject 60 mg into the skin every 6 (six) months.     dextromethorphan-guaiFENesin (MUCINEX DM) 30-600 MG 12hr tablet Take 1 tablet by mouth 2 (two) times daily.     docusate sodium (COLACE) 100 MG capsule Take 300 mg by mouth at bedtime.      DULoxetine (CYMBALTA) 30 MG capsule Take 30 mg by mouth daily.     DULoxetine (CYMBALTA) 60 MG capsule Take 60 mg by mouth daily.     empagliflozin (JARDIANCE) 25 MG TABS tablet Take 25 mg by mouth daily.     fluticasone (FLONASE) 50 MCG/ACT nasal spray Place 2 sprays into both nostrils at bedtime.     Furosemide (FUROSCIX) 80 MG/10ML CTKT Inject 80 mg into the skin as directed.     gabapentin (NEURONTIN) 600 MG tablet Take 600 mg by mouth 2 (two) times daily.     glimepiride (AMARYL) 2 MG tablet Take 1 tablet (2 mg) by mouth twice daily     levothyroxine (SYNTHROID) 125 MCG tablet TAKE 1 TABLET IN THE MORNING BEFORE BREAKFAST 90 tablet 3   metolazone (ZAROXOLYN) 5 MG tablet Take 5 mg by mouth daily as needed (for swelling). Take 30 minutes before Torsemide     Multiple Vitamin (MULTIVITAMIN WITH MINERALS) TABS tablet Take 1 tablet by mouth daily. Centrum Silver     Multiple Vitamins-Minerals (OCUVITE PO) Take 1 tablet by mouth in the morning.     omeprazole (PRILOSEC) 20 MG capsule Take 20 mg by mouth in the morning.     potassium chloride SA (KLOR-CON M) 20 MEQ tablet Take 2 tablets (40 mEq total) by mouth 3 (three) times daily for 1 day, THEN 1 tablet (20 mEq total) daily. 90 tablet 3   Sodium Fluoride (PREVIDENT 5000 BOOSTER PLUS) 1.1 % PSTE After flossing, brush this paste on all teeth for two full minutes at bedtime, Spit out excess but do not rinse out or eat/drink for 30 min.     torsemide (DEMADEX) 20 MG tablet Take 2 tablets (40 mg total) by mouth daily. Increase to 40 mg BID for wt 206 or greater 180 tablet 3   No current facility-administered medications for this  visit.    Allergies  Allergen Reactions   Ibuprofen Other (See Comments)    Avoid due to stage 3 kidney disease        Social History   Socioeconomic History   Marital status: Widowed    Spouse name: Not on file   Number of children: Not on file   Years of education: Not on file   Highest education level: Not on file  Occupational History  Occupation: Full time    Employer: [employer]  Tobacco Use   Smoking status: Never   Smokeless tobacco: Never  Vaping Use   Vaping Use: Never used  Substance and Sexual Activity   Alcohol use: No   Drug use: No   Sexual activity: Not on file  Other Topics Concern   Not on file  Social History Narrative   Married   Does not get regular exercise   Social Determinants of Health   Financial Resource Strain: Not on file  Food Insecurity: Not on file  Transportation Needs: Not on file  Physical Activity: Not on file  Stress: Not on file  Social Connections: Not on file  Intimate Partner Violence: Not on file      Family History  Problem Relation Age of Onset   Prostate cancer Father    Breast cancer Sister 75    Vitals:   02/02/22 1049  BP: 100/65  Pulse: 84  Resp: 18  SpO2: 91%  Weight: 195 lb 2 oz (88.5 kg)  Height: 4' 11"  (1.499 m)   Wt Readings from Last 3 Encounters:  02/02/22 195 lb 2 oz (88.5 kg)  01/27/22 213 lb 6 oz (96.8 kg)  01/26/22 209 lb (94.8 kg)    PHYSICAL EXAM: General:  Weak appearing. No resp difficulty HEENT: normal Neck: supple. no JVD. Carotids 2+ bilat; no bruits. No lymphadenopathy or thryomegaly appreciated. Cor: PMI nondisplaced. Regular rate & rhythm. No rubs, gallops or murmurs. Lungs: clear Abdomen: obese soft, nontender, nondistended. No hepatosplenomegaly. No bruits or masses. Good bowel sounds. Extremities: no cyanosis, clubbing, rash, edema Neuro: alert & orientedx3, cranial nerves grossly intact. moves all 4 extremities w/o difficulty. Affect pleasant   ASSESSMENT &  PLAN:  1. Chronic systolic HF - due to NICM. Dates back to 2104. Remote cath non-obstructive CAD - Etiology unclear (?IVCD/LBBB) - Echo 5/22 EF 20-25% - At baseline NYHA II-III -> Now remains NYHA IIIB-IV - Volume status much better but still with near end-stage symptoms  - ICD interrogated Optivol way down. No VT  - Continue torsemide 40 + kcl 20 when weight >= 200 pounds. IF weight hits 206 pounds increase torsemide to 40 bid  - Continue to hold losartan, carvedilol and aldactone - Continue Jardiance - I am now worried that she has end-stage HF. Given CKD IV and other comorbidities not candidate for advanced therapies. Will continue to follow. If no improvement consider RHC.  - We discussed gravity of her situation   2. PAF - in NSR today - continue amio  - Continue Eliquis  3. AKI on CKD IIIB-IV - baseline Scr 1.8-2.0 - Recently  2.4-> 2.6. Suspect cardiorenal/overdiuresis - Repeat labs today  4. OSA  - continue CPAP  5. DM2  - per PCP   RTC 2 weeks  Total time spent 45 minutes. Over half that time spent discussing above.    Glori Bickers, MD  2:04 PM

## 2022-02-11 NOTE — Patient Instructions (Addendum)
Medication Changes:  No medication changes today. Continue taking medication as prescribed  Lab Work:  Go to [facility] mall at [facility] for lab work in 1 week   Testing/Procedures:  None   Referrals:  You have been referred to [facility]. They will call you to schedule an appointment. You have been referred to Cardiac Rehab. They will call you to schedule an appointment.    Special Instructions // Education:  Do the following things EVERYDAY: Weigh yourself in the morning before breakfast. Write it down and keep it in a log. Take your medicines as prescribed Eat low salt foods--Limit salt (sodium) to 2000 mg per day.  Stay as active as you can everyday Limit all fluids for the day to less than 2 liters   Follow-Up in: 3-4 weeks    If you have any questions or concerns before your next appointment please send [place] a message through [place] or call our office at [phone]

## 2022-02-13 ENCOUNTER — Encounter (HOSPITAL_COMMUNITY): Payer: Self-pay | Admitting: Emergency Medicine

## 2022-02-13 ENCOUNTER — Observation Stay (HOSPITAL_COMMUNITY)
Admission: EM | Admit: 2022-02-13 | Discharge: 2022-03-05 | Disposition: E | Payer: Medicare HMO | Attending: Internal Medicine | Admitting: Internal Medicine

## 2022-02-13 ENCOUNTER — Emergency Department (HOSPITAL_COMMUNITY): Payer: Medicare HMO

## 2022-02-13 ENCOUNTER — Telehealth: Payer: Self-pay | Admitting: Physician Assistant

## 2022-02-13 ENCOUNTER — Other Ambulatory Visit: Payer: Self-pay

## 2022-02-13 DIAGNOSIS — I4891 Unspecified atrial fibrillation: Secondary | ICD-10-CM | POA: Diagnosis not present

## 2022-02-13 DIAGNOSIS — I130 Hypertensive heart and chronic kidney disease with heart failure and stage 1 through stage 4 chronic kidney disease, or unspecified chronic kidney disease: Secondary | ICD-10-CM | POA: Insufficient documentation

## 2022-02-13 DIAGNOSIS — E119 Type 2 diabetes mellitus without complications: Secondary | ICD-10-CM

## 2022-02-13 DIAGNOSIS — Z1152 Encounter for screening for COVID-19: Secondary | ICD-10-CM | POA: Insufficient documentation

## 2022-02-13 DIAGNOSIS — Z96652 Presence of left artificial knee joint: Secondary | ICD-10-CM | POA: Insufficient documentation

## 2022-02-13 DIAGNOSIS — N179 Acute kidney failure, unspecified: Secondary | ICD-10-CM | POA: Insufficient documentation

## 2022-02-13 DIAGNOSIS — I480 Paroxysmal atrial fibrillation: Secondary | ICD-10-CM | POA: Diagnosis not present

## 2022-02-13 DIAGNOSIS — Z7901 Long term (current) use of anticoagulants: Secondary | ICD-10-CM | POA: Insufficient documentation

## 2022-02-13 DIAGNOSIS — J9601 Acute respiratory failure with hypoxia: Secondary | ICD-10-CM | POA: Diagnosis not present

## 2022-02-13 DIAGNOSIS — Z515 Encounter for palliative care: Secondary | ICD-10-CM

## 2022-02-13 DIAGNOSIS — E871 Hypo-osmolality and hyponatremia: Secondary | ICD-10-CM | POA: Insufficient documentation

## 2022-02-13 DIAGNOSIS — I5023 Acute on chronic systolic (congestive) heart failure: Secondary | ICD-10-CM | POA: Diagnosis not present

## 2022-02-13 DIAGNOSIS — Z79899 Other long term (current) drug therapy: Secondary | ICD-10-CM | POA: Insufficient documentation

## 2022-02-13 DIAGNOSIS — J189 Pneumonia, unspecified organism: Secondary | ICD-10-CM | POA: Insufficient documentation

## 2022-02-13 DIAGNOSIS — E1122 Type 2 diabetes mellitus with diabetic chronic kidney disease: Secondary | ICD-10-CM | POA: Insufficient documentation

## 2022-02-13 DIAGNOSIS — N184 Chronic kidney disease, stage 4 (severe): Secondary | ICD-10-CM | POA: Diagnosis present

## 2022-02-13 DIAGNOSIS — I959 Hypotension, unspecified: Secondary | ICD-10-CM | POA: Diagnosis not present

## 2022-02-13 DIAGNOSIS — R7989 Other specified abnormal findings of blood chemistry: Secondary | ICD-10-CM | POA: Diagnosis not present

## 2022-02-13 DIAGNOSIS — Z950 Presence of cardiac pacemaker: Secondary | ICD-10-CM | POA: Insufficient documentation

## 2022-02-13 DIAGNOSIS — I10 Essential (primary) hypertension: Secondary | ICD-10-CM | POA: Insufficient documentation

## 2022-02-13 DIAGNOSIS — I5084 End stage heart failure: Secondary | ICD-10-CM | POA: Insufficient documentation

## 2022-02-13 DIAGNOSIS — Z853 Personal history of malignant neoplasm of breast: Secondary | ICD-10-CM | POA: Insufficient documentation

## 2022-02-13 DIAGNOSIS — R0600 Dyspnea, unspecified: Secondary | ICD-10-CM | POA: Diagnosis not present

## 2022-02-13 DIAGNOSIS — E039 Hypothyroidism, unspecified: Secondary | ICD-10-CM | POA: Diagnosis not present

## 2022-02-13 DIAGNOSIS — E875 Hyperkalemia: Secondary | ICD-10-CM | POA: Insufficient documentation

## 2022-02-13 DIAGNOSIS — R570 Cardiogenic shock: Secondary | ICD-10-CM | POA: Insufficient documentation

## 2022-02-13 DIAGNOSIS — A419 Sepsis, unspecified organism: Secondary | ICD-10-CM | POA: Insufficient documentation

## 2022-02-13 DIAGNOSIS — F32A Depression, unspecified: Secondary | ICD-10-CM | POA: Insufficient documentation

## 2022-02-13 DIAGNOSIS — R0602 Shortness of breath: Secondary | ICD-10-CM | POA: Diagnosis not present

## 2022-02-13 LAB — SARS CORONAVIRUS 2 BY RT PCR: SARS Coronavirus 2 by RT PCR: NEGATIVE

## 2022-02-13 LAB — CBC
HCT: 44.5 % (ref 36.0–46.0)
Hemoglobin: 13.9 g/dL (ref 12.0–15.0)
MCH: 29.5 pg (ref 26.0–34.0)
MCHC: 31.2 g/dL (ref 30.0–36.0)
MCV: 94.5 fL (ref 80.0–100.0)
Platelets: 270 10*3/uL (ref 150–400)
RBC: 4.71 MIL/uL (ref 3.87–5.11)
RDW: 16.4 % — ABNORMAL HIGH (ref 11.5–15.5)
WBC: 15.1 10*3/uL — ABNORMAL HIGH (ref 4.0–10.5)
nRBC: 0.5 % — ABNORMAL HIGH (ref 0.0–0.2)

## 2022-02-13 LAB — I-STAT ARTERIAL BLOOD GAS, ED
Acid-base deficit: 19 mmol/L — ABNORMAL HIGH (ref 0.0–2.0)
Bicarbonate: 8 mmol/L — ABNORMAL LOW (ref 20.0–28.0)
Calcium, Ion: 0.99 mmol/L — ABNORMAL LOW (ref 1.15–1.40)
HCT: 45 % (ref 36.0–46.0)
Hemoglobin: 15.3 g/dL — ABNORMAL HIGH (ref 12.0–15.0)
O2 Saturation: 99 %
Patient temperature: 96.6
Potassium: 5.7 mmol/L — ABNORMAL HIGH (ref 3.5–5.1)
Sodium: 121 mmol/L — ABNORMAL LOW (ref 135–145)
TCO2: 9 mmol/L — ABNORMAL LOW (ref 22–32)
pCO2 arterial: 20.8 mmHg — ABNORMAL LOW (ref 32–48)
pH, Arterial: 7.183 — CL (ref 7.35–7.45)
pO2, Arterial: 169 mmHg — ABNORMAL HIGH (ref 83–108)

## 2022-02-13 LAB — BRAIN NATRIURETIC PEPTIDE: B Natriuretic Peptide: 2121.6 pg/mL — ABNORMAL HIGH (ref 0.0–100.0)

## 2022-02-13 LAB — LACTIC ACID, PLASMA
Lactic Acid, Venous: >9 mmol/L (ref 0.5–1.9)
Lactic Acid, Venous: >9 mmol/L (ref 0.5–1.9)

## 2022-02-13 LAB — TROPONIN I (HIGH SENSITIVITY): Troponin I (High Sensitivity): 24 ng/L — ABNORMAL HIGH (ref <18)

## 2022-02-13 MED ORDER — MORPHINE SULFATE (PF) 2 MG/ML IV SOLN: 1.0000 mg | INTRAVENOUS | Status: DC | PRN

## 2022-02-13 MED ORDER — POLYVINYL ALCOHOL 1.4 % OP SOLN: 1.0000 [drp] | Freq: Four times a day (QID) | OPHTHALMIC | Status: DC | PRN

## 2022-02-13 MED ORDER — GLYCOPYRROLATE 1 MG PO TABS: 1.0000 mg | ORAL_TABLET | ORAL | Status: DC | PRN

## 2022-02-13 MED ORDER — MORPHINE 100MG IN NS 100ML (1MG/ML) PREMIX INFUSION
1.0000 mg/h | INTRAVENOUS | Status: DC
  Administered 2022-02-13 – 2022-02-14 (×3): 1 mg/h via INTRAVENOUS
  Filled 2022-02-13: qty 100

## 2022-02-13 MED ORDER — GLYCOPYRROLATE 0.2 MG/ML IJ SOLN: 0.2000 mg | INTRAMUSCULAR | Status: DC | PRN

## 2022-02-13 MED ORDER — MORPHINE SULFATE (PF) 2 MG/ML IV SOLN: 2.0000 mg | Freq: Once | INTRAVENOUS | Status: AC

## 2022-02-13 MED ORDER — ACETAMINOPHEN 325 MG PO TABS: 650.0000 mg | ORAL_TABLET | Freq: Four times a day (QID) | ORAL | Status: DC | PRN

## 2022-02-13 MED ORDER — SODIUM CHLORIDE 0.9 % IV SOLN: 1.0000 g | INTRAVENOUS | Status: DC

## 2022-02-13 MED ORDER — BIOTENE DRY MOUTH MT LIQD: 15.0000 mL | OROMUCOSAL | Status: DC | PRN

## 2022-02-13 MED ORDER — LACTATED RINGERS IV BOLUS
500.0000 mL | Freq: Once | INTRAVENOUS | Status: AC
  Administered 2022-02-13: 500 mL via INTRAVENOUS

## 2022-02-13 MED ORDER — SODIUM CHLORIDE 0.9 % IV SOLN
2.0000 g | Freq: Once | INTRAVENOUS | Status: AC
  Administered 2022-02-13: 2 g via INTRAVENOUS
  Filled 2022-02-13: qty 12.5

## 2022-02-13 MED ORDER — ACETAMINOPHEN 650 MG RE SUPP: 650.0000 mg | Freq: Four times a day (QID) | RECTAL | Status: DC | PRN

## 2022-02-13 MED ORDER — METRONIDAZOLE 500 MG/100ML IV SOLN
500.0000 mg | Freq: Once | INTRAVENOUS | Status: DC
  Filled 2022-02-13: qty 100

## 2022-02-13 MED ORDER — VANCOMYCIN HCL 750 MG/150ML IV SOLN: 750.0000 mg | INTRAVENOUS | Status: DC

## 2022-02-13 MED ORDER — VANCOMYCIN HCL IN DEXTROSE 1-5 GM/200ML-% IV SOLN: 1000.0000 mg | Freq: Once | INTRAVENOUS | Status: DC

## 2022-02-13 MED ORDER — MORPHINE SULFATE (PF) 2 MG/ML IV SOLN
INTRAVENOUS | Status: AC
  Administered 2022-02-13: 2 mg via INTRAVENOUS
  Filled 2022-02-13: qty 1

## 2022-02-13 MED ORDER — VANCOMYCIN HCL 1750 MG/350ML IV SOLN
1750.0000 mg | Freq: Once | INTRAVENOUS | Status: AC
  Administered 2022-02-13: 1750 mg via INTRAVENOUS
  Filled 2022-02-13: qty 350

## 2022-02-13 MED ORDER — LACTATED RINGERS IV SOLN: INTRAVENOUS | Status: DC

## 2022-02-13 MED ORDER — ONDANSETRON HCL 4 MG/2ML IJ SOLN: 4.0000 mg | Freq: Four times a day (QID) | INTRAMUSCULAR | Status: DC | PRN

## 2022-02-13 MED ORDER — ONDANSETRON 4 MG PO TBDP: 4.0000 mg | ORAL_TABLET | Freq: Four times a day (QID) | ORAL | Status: DC | PRN

## 2022-02-13 MED ORDER — MORPHINE SULFATE (PF) 2 MG/ML IV SOLN
2.0000 mg | INTRAVENOUS | Status: DC | PRN
  Administered 2022-02-13 (×3): 2 mg via INTRAVENOUS
  Filled 2022-02-13 (×3): qty 1

## 2022-02-13 NOTE — ED Notes (Signed)
Family at bedside. 

## 2022-02-13 NOTE — ED Notes (Signed)
MD present in room

## 2022-02-13 NOTE — ED Triage Notes (Signed)
Pt presents to ED for evaluation of possible fluid overload  after being referred to ED by PCP endorsing shortness of breath. Daughter is present at time of triage and states that pt is more lethargic than normal, oral temperature unobtainable at time of triage.

## 2022-02-13 NOTE — H&P (Incomplete)
History and Physical    Katherine Rosales JJO:[id number] DOB: 1950/06/15 DOA: 02/11/2022  PCP: Katherine Body, MD  Patient coming from: Home  Chief Complaint: Shortness of breath  HPI: Katherine Rosales is a 71 y.o. female with medical history significant of chronic systolic CHF secondary to NICM status post AICD (EF 20 to 25% per echo 08/2020), paroxysmal A-fib status post DCCV (02/2020, 05/2021), CKD stage IIIb-IV, history of breast cancer status post chemo/XRT, depression, type 2 diabetes, hypertension, hypothyroidism, NASH, OSA on CPAP, class III obesity.  She was seen by cardiology on 01/27/2022 for marked volume overload and NYHA IV symptoms not responding to increased oral diuretics and was started on Furoscix.  Seen at cardiology clinic again on 10/31 and weight noted to be down by 20 pounds/very volume depleted and orthostatic.  Diuretics stopped and she was given IV fluids.  She returned to cardiology office on 11/9 with worsening dyspnea/end-stage heart failure.  Labs done during this visit showing worsening kidney function - creatinine 2.8 (baseline 1.8-2.0).  Cardiology felt that she was not a candidate for advanced therapies due to CKD stage IV and other comorbidities.  Plan was to consider right heart cath if not improving.  She was advised to continue torsemide and Jardiance.    Patient presents to the ED today due to lethargy, worsening dyspnea, weight gain, bilateral lower extremity edema, and decreased urine output.  In the ED, patient noted to be hypoxic to the 80s and placed on nonrebreather.  Hypotensive on arrival with blood pressure 83/72.  Temperature 96.9 F.  Labs showing WBC 15.1, BNP 2121.6, high sensitive troponin 24, SARS-CoV-2 PCR negative, lactic acid >9.0. ABG on nonrebreather showing pH 7.18, PCO2 20.8, PO2 169, bicarb 8.0.  I-STAT chemistry showing sodium 121, potassium 5.7.  CMP, PT/INR, APTT, and UA not drawn.  Blood culture pending.  Chest x-ray showing  questionable retrocardiac airspace opacity.  EKG showing A-fib, diffuse ST elevations, and QRS widening more pronounced compared to prior EKG. Patient was given broad-spectrum antibiotics and 500 cc fluid bolus.  Patient blood pressure continued to drop and ED physician subsequently had a goals of care discussion with the patient's family including her niece/HCPOA and she was made DNR/ DNI. Family made the decision to transition her to full comfort care measures. She was placed on morphine gtt by ED physician after discussion with family and [facility] called to admit for full comfort care.   At the time of my arrival, patient was somnolent and constantly moaning.  She was able to open her eyes and tell me her name and able to recognize her family members.  She continued to moan and was not able to give any additional history.  She was not able to tell me if she is in pain. Several family members present at bedside including her niece Katherine Rosales who is also her 26.  Family informed me that they know that the patient is actively dying and confirmed that she is DNR/DNI.  Family confirmed that they do not want any further treatments/lab draws and requested full comfort care measures only.  Family is okay with continuing morphine infusion and morphine PRN pain/dyspnea.   Review of Systems:  Review of Systems  All other systems reviewed and are negative.   Past Medical History:  Diagnosis Date  . 1601 Lead-Medtronic   . Anemia 11/12/2016  . Anxiety 12/25/2013  . Automatic implantable cardioverter-defibrillator in situ   . Breast cancer ([place]) 03/1995   right breast ca with  lumpectomy and chemo and rad  . Cardiomyopathy, nonischemic ([place])    a. Prior cath w/ nonobs CAD; b. s/p AICD w/ subsequent upgrade 11/2012-> MDT Evera XT VR single lead ICD (ser # [id number]); c. 12/2018 Echo: EF 30-35%; d. 08/2020 Echo: EF 20-25%, glob HK. Mod elev RVSP. Mod dil LA, mild to mod RAE. Mod MR/TR. Mild-mod AoV sclerosis.  .  Chronic HFrEF (heart failure with reduced ejection fraction) ([place])    a. 12/2018 Echo: EF 30-35%; b. 08/2020 Echo: EF 20-25%, glob HK.  . CKD (chronic kidney disease), stage III ([place]) 11/12/2016  . Depression   . Diabetes mellitus    Type II Diabetes  . Hypertension   . Hypothyroidism   . ICD (implantable cardiac defibrillator), singleMedtronic   . NASH (nonalcoholic steatohepatitis)   . OSA (obstructive sleep apnea)    a. On CPAP.  Marland Kitchen PAF (paroxysmal atrial fibrillation) ([place])    a. 09/2020 s/p DCCV.  Marland Kitchen Personal history of chemotherapy 1996   right breast ca  . Personal history of radiation therapy 1996   right breast ca  . PVC's (premature ventricular contractions)    a. 08/2020 Zio: 8.1% PVC burden.  . Sleep apnea    On CPAP  . Spleen enlarged   . Systolic heart failure, chronic ([place])     Past Surgical History:  Procedure Laterality Date  . ABDOMINAL HYSTERECTOMY    . BREAST BIOPSY Right 11/26/2015   - FAT NECROSIS, FIBROSIS, AND CHRONIC INFLAMMATION.   Marland Kitchen BREAST BIOPSY Right 12/20/2018   Affirm Bx- X-clip- path pending  . BREAST BIOPSY WITH RADIO FREQUENCY LOCALIZER Right 10/18/2019   Procedure: RF TAG GUIDED EXCISIONAL BREAST BIOPSY;  Surgeon: Katherine Sprague, DO;  Location: [facility] ORS;  Service: General;  Laterality: Right;  . BREAST EXCISIONAL BIOPSY Right 03/1995   + breast ca chemo and rad. 9:00 region  . BREAST EXCISIONAL BIOPSY Right 2011   surgical bx neg  11:00 region  . BREAST LUMPECTOMY Right 1996  . CARDIOVERSION N/A 02/13/2020   Procedure: CARDIOVERSION;  Surgeon: Katherine Sable, MD;  Location: [facility] ORS;  Service: Cardiovascular;  Laterality: N/A;  . CARDIOVERSION N/A 09/29/2020   Procedure: CARDIOVERSION;  Surgeon: Katherine Hampshire, MD;  Location: [facility] ORS;  Service: Cardiovascular;  Laterality: N/A;  . CARDIOVERSION N/A 05/08/2021   Procedure: CARDIOVERSION;  Surgeon: Katherine Merritts, MD;  Location: [facility] ORS;  Service: Cardiovascular;  Laterality: N/A;  .  CHOLECYSTECTOMY    . COLONOSCOPY  03/05/2013  . COLONOSCOPY W/ POLYPECTOMY  03/05/2013  . COLONOSCOPY WITH PROPOFOL N/A 10/03/2017   Procedure: COLONOSCOPY WITH PROPOFOL;  Surgeon: Manya Silvas, MD;  Location: [facility] ENDOSCOPY;  Service: Endoscopy;  Laterality: N/A;  . ESOPHAGOGASTRODUODENOSCOPY  01/11/2003, 06/25/2015  . ESOPHAGOGASTRODUODENOSCOPY (EGD) WITH PROPOFOL N/A 06/25/2015   Procedure: ESOPHAGOGASTRODUODENOSCOPY (EGD) WITH PROPOFOL;  Surgeon: Manya Silvas, MD;  Location: [facility] ENDOSCOPY;  Service: Endoscopy;  Laterality: N/A;  . FOOT SURGERY Right   . IMPLANTABLE CARDIOVERTER DEFIBRILLATOR (ICD) GENERATOR CHANGE N/A 11/15/2012   Procedure: ICD GENERATOR CHANGE;  Surgeon: Deboraha Sprang, MD;  Location: [facility] CATH LAB;  Service: Cardiovascular;  Laterality: N/A;  . JOINT REPLACEMENT  2009   Total Knee Replacement, Left  . LEAD REVISION N/A 11/15/2012   Procedure: LEAD REVISION;  Surgeon: Deboraha Sprang, MD;  Location: [facility] CATH LAB;  Service: Cardiovascular;  Laterality: N/A;  . PACEMAKER INSERTION  06/30/2005   Medtronic Virtuoso VR ICD - Nonischemic cardiomyopathy potentially related to adriamycin, class 3 congestive  failture with a narrow QRS and a negative TDI  . TONSILLECTOMY    . TUBAL LIGATION       reports that she has never smoked. She has never used smokeless tobacco. She reports that she does not drink alcohol and does not use drugs.  Allergies  Allergen Reactions  . Ibuprofen Other (See Comments)    Avoid due to stage 3 kidney disease      Family History  Problem Relation Age of Onset  . Prostate cancer Father   . Breast cancer Sister 83    Prior to Admission medications   Medication Sig Start Date End Date Taking? Authorizing Provider  amiodarone (PACERONE) 200 MG tablet Take 1 tablet (200 mg total) by mouth daily. 12/03/21   Deboraha Sprang, MD  apixaban (ELIQUIS) 5 MG TABS tablet TAKE 1 TABLET BY MOUTH TWICE DAILY 09/04/21   Deboraha Sprang, MD  Ascorbic Acid  (VITAMIN C) 1000 MG tablet Take 1,000 mg by mouth in the morning.    [provider]  Calcium Carb-Cholecalciferol (CALCIUM 600 + D PO) Take 1 tablet by mouth in the morning.    [provider]  cetirizine (ZYRTEC) 10 MG tablet Take 10 mg by mouth daily. Patient not taking: Reported on 02/11/2022    [provider]  CINNAMON PO Take 1,000 mg by mouth in the morning.    [provider]  denosumab (PROLIA) 60 MG/ML SOSY injection Inject 60 mg into the skin every 6 (six) months.    [provider]  dextromethorphan-guaiFENesin (MUCINEX DM) 30-600 MG 12hr tablet Take 1 tablet by mouth 2 (two) times daily.    [provider]  docusate sodium (COLACE) 100 MG capsule Take 300 mg by mouth at bedtime.     [provider]  DULoxetine (CYMBALTA) 30 MG capsule Take 30 mg by mouth daily. 01/19/22   [provider]  DULoxetine (CYMBALTA) 60 MG capsule Take 60 mg by mouth daily. 08/17/19   [provider]  empagliflozin (JARDIANCE) 25 MG TABS tablet Take 25 mg by mouth daily.    [provider]  fluticasone (FLONASE) 50 MCG/ACT nasal spray Place 2 sprays into both nostrils at bedtime.    [provider]  Furosemide (FUROSCIX) 80 MG/10ML CTKT Inject 80 mg into the skin as directed. Patient not taking: Reported on 02/11/2022 01/27/22   Bensimhon, Shaune Pascal, MD  gabapentin (NEURONTIN) 600 MG tablet Take 600 mg by mouth 2 (two) times daily.    [provider]  glimepiride (AMARYL) 2 MG tablet Take 1 tablet (2 mg) by mouth twice daily    [provider]  levothyroxine (SYNTHROID) 125 MCG tablet TAKE 1 TABLET IN THE MORNING BEFORE BREAKFAST 10/30/21   Deboraha Sprang, MD  metolazone (ZAROXOLYN) 5 MG tablet Take 5 mg by mouth daily as needed (for swelling). Take 30 minutes before Torsemide    [provider]  Multiple Vitamin (MULTIVITAMIN WITH MINERALS) TABS tablet Take 1 tablet by mouth daily.  Centrum Silver    [provider]  Multiple Vitamins-Minerals (OCUVITE PO) Take 1 tablet by mouth in the morning.    [provider]  omeprazole (PRILOSEC) 20 MG capsule Take 20 mg by mouth in the morning.    [provider]  potassium chloride SA (KLOR-CON M) 20 MEQ tablet Take 2 tablets (40 mEq total) by mouth 3 (three) times daily for 1 day, THEN 1 tablet (20 mEq total) daily. 02/02/22 02/03/23  Bensimhon, Quillian Quince  R, MD  Sodium Fluoride (PREVIDENT 5000 BOOSTER PLUS) 1.1 % PSTE After flossing, brush this paste on all teeth for two full minutes at bedtime, Spit out excess but do not rinse out or eat/drink for 30 min. 01/12/22   [provider]  torsemide (DEMADEX) 20 MG tablet Take 2 tablets (40 mg total) by mouth daily. Increase to 40 mg BID for wt 206 or greater 02/02/22   Bensimhon, Shaune Pascal, MD    Physical Exam: Vitals:   02/25/2022 2030 02/09/2022 2045 02/23/2022 2102 02/12/2022 2105  BP: 94/68 92/69 105/77   Pulse: 63 63 65   Resp: (!) 32 20 16   Temp:    (!) 96.9 F (36.1 C)  TempSrc:    Rectal  SpO2: 97% 100% 100%     Physical Exam Vitals reviewed.  Constitutional:      General: She is not in acute distress. HENT:     Head: Normocephalic and atraumatic.  Eyes:     Extraocular Movements: Extraocular movements intact.  Cardiovascular:     Rate and Rhythm: Normal rate and regular rhythm.     Pulses: Normal pulses.  Pulmonary:     Effort: Pulmonary effort is normal.     Breath sounds: No wheezing.  Abdominal:     General: Bowel sounds are normal. There is no distension.     Palpations: Abdomen is soft.     Tenderness: There is no abdominal tenderness.  Musculoskeletal:     Cervical back: Normal range of motion.     Right lower leg: No edema.     Left lower leg: No edema.  Skin:    Comments: Extremities cool to touch  Neurological:     Mental Status: She is alert.     Comments: Somnolent but arousable Oriented to self and able to recognize  family members Constantly moaning Not following commands     Labs on Admission: I have personally reviewed following labs and imaging studies  CBC: Recent Labs  Lab 02/22/2022 2032 03/03/2022 2100  WBC 15.1*  --   HGB 13.9 15.3*  HCT 44.5 45.0  MCV 94.5  --   PLT 270  --    Basic Metabolic Panel: Recent Labs  Lab 02/11/22 1003 02/22/2022 2100  NA 132* 121*  K 4.5 5.7*  CL 94*  --   CO2 22  --   GLUCOSE 188*  --   BUN 61*  --   CREATININE 2.85*  --   CALCIUM 9.3  --    GFR: Estimated Creatinine Clearance: 17.7 mL/min (A) (by C-G formula based on SCr of 2.85 mg/dL (H)). Liver Function Tests: No results for input(s): "AST", "ALT", "ALKPHOS", "BILITOT", "PROT", "ALBUMIN" in the last 168 hours. No results for input(s): "LIPASE", "AMYLASE" in the last 168 hours. No results for input(s): "AMMONIA" in the last 168 hours. Coagulation Profile: No results for input(s): "INR", "PROTIME" in the last 168 hours. Cardiac Enzymes: No results for input(s): "CKTOTAL", "CKMB", "CKMBINDEX", "TROPONINI" in the last 168 hours. BNP (last 3 results) No results for input(s): "PROBNP" in the last 8760 hours. HbA1C: No results for input(s): "HGBA1C" in the last 72 hours. CBG: No results for input(s): "GLUCAP" in the last 168 hours. Lipid Profile: No results for input(s): "CHOL", "HDL", "LDLCALC", "TRIG", "CHOLHDL", "LDLDIRECT" in the last 72 hours. Thyroid Function Tests: No results for input(s): "TSH", "T4TOTAL", "FREET4", "T3FREE", "THYROIDAB" in the last 72 hours. Anemia Panel: No results for input(s): "VITAMINB12", "FOLATE", "FERRITIN", "TIBC", "IRON", "RETICCTPCT" in  the last 72 hours. Urine analysis: No results found for: "COLORURINE", "APPEARANCEUR", "LABSPEC", "PHURINE", "GLUCOSEU", "HGBUR", "BILIRUBINUR", "KETONESUR", "PROTEINUR", "UROBILINOGEN", "NITRITE", "LEUKOCYTESUR"  Radiological Exams on Admission: DG Chest Port 1 View  Result Date: 02/24/2022 CLINICAL DATA:  dyspnea EXAM:  PORTABLE CHEST 1 VIEW COMPARISON:  Chest x-ray 10/26/2021 FINDINGS: Left chest wall dual lead cardiac device in stable position. Enlarged cardiac silhouette. The heart and mediastinal contours are unchanged. Question retrocardiac airspace opacity. No pulmonary edema. No pleural effusion. No pneumothorax. No acute osseous abnormality. IMPRESSION: 1. Question retrocardiac airspace opacity recommend repeat PA and lateral view of the chest with improved inspiratory effort. 2. Stable cardiomegaly. Electronically Signed   By: Iven Finn M.D.   On: 02/20/2022 21:01    Assessment and Plan  End-of-life care      chronic systolic CHF secondary to NICM status post AICD (EF 20 to 25% per echo 08/2020), paroxysmal A-fib status post DCCV (02/2020, 05/2021), CKD stage IIIb-IV, history of breast cancer status post chemo/XRT, depression, type 2 diabetes, hypertension, hypothyroidism, NASH, OSA on CPAP, class III obesity.  She was seen by cardiology on 01/27/2022 for marked volume overload and NYHA IV symptoms not responding to increased oral diuretics and was started on Furoscix.  Seen at cardiology clinic again on 10/31 and weight noted to be down by 20 pounds/very volume depleted and orthostatic.  Diuretics stopped and she was given IV fluids.  She returned to cardiology office on 11/9 with worsening dyspnea/end-stage heart failure.  Labs done during this visit showing worsening kidney function - creatinine 2.8 (baseline 1.8-2.0).  Cardiology felt that she was not a candidate for advanced therapies due to CKD stage IV and other comorbidities.  Plan was to consider right heart cath if not improving.  She was advised to continue torsemide and Jardiance.    Patient presents to the ED today due to lethargy, worsening dyspnea, weight gain, bilateral lower extremity edema, and decreased urine output.  In the ED, patient noted to be hypoxic to the 80s and placed on nonrebreather.  Hypotensive on arrival with blood pressure  83/72.  Temperature 96.9 F.  Labs showing WBC 15.1, BNP 2121.6, high sensitive troponin 24, SARS-CoV-2 PCR negative, lactic acid >9.0. ABG on nonrebreather showing pH 7.18, PCO2 20.8, PO2 169, bicarb 8.0.  I-STAT chemistry showing sodium 121, potassium 5.7.  CMP, PT/INR, APTT, and UA not drawn.  Blood culture pending.  Chest x-ray showing questionable retrocardiac airspace opacity.  EKG showing A-fib, diffuse ST elevations, and QRS widening more pronounced compared to prior EKG. Patient was given broad-spectrum antibiotics and 500 cc fluid bolus.  Patient blood pressure continued to drop and ED physician subsequently had a goals of care discussion with the patient's family including her niece/HCPOA and she was made DNR/ DNI. Family made the decision to transition her to full comfort care measures. She was placed on morphine gtt by ED physician after discussion with family and TRH called to admit for full comfort care.   At the time of my arrival, patient was somnolent and constantly moaning.  She was able to open her eyes and tell me her name and able to recognize her family members.  She continued to moan and was not able to give any additional history.  She was not able to tell me if she is in pain. Several family members present at bedside including her niece Katherine Rosales who is also her 31.  Family informed me that they know that the patient is actively dying and confirmed  that she is DNR/DNI.  Family confirmed that they do not want any further treatments/lab draws and requested full comfort care measures only.  Family is okay with continuing morphine infusion and morphine PRN pain/dyspnea.   DVT prophylaxis: {Blank single:19197::"Lovenox","SQ Heparin","IV heparin gtt","Xarelto","Eliquis","Coumadin","SCDs","***"} Code Status: {Blank single:19197::"Full Code","DNR","DNR/DNI","Comfort Care","***"} Family Communication: ***  Consults called: ***  Level of care: {Blank single:19197::"Med-Surg","Telemetry  bed","Progressive Care Unit","Step Down Unit"} Admission status: ***  Shela Leff MD [facility]  If 7PM-7AM, please contact night-coverage [email or web address]  02/03/2022, 11:45 PM

## 2022-02-13 NOTE — Progress Notes (Signed)
Pharmacy Antibiotic Note  Katherine Rosales is a 71 y.o. female admitted on 02/22/2022 with sepsis.  Pharmacy has been consulted for vancomycin and cefepime dosing. Also on metronidazole.  Vanc 1754m and cefepime 2g given in ED.  11/11 Vancomycin 7535mQ 48 hr Scr used: 2.85 mg/dL Weight: 92.1 kg Vd coeff: 0.5 L/kg Est AUC: 562  Plan: Cefepime 1g q24 hr  Vancomycin 75078m48 hr Monitor cultures, clinical status, renal function, vancomycin level Narrow abx as able and f/u duration     Temp (24hrs), Avg:96.9 F (36.1 C), Min:96.9 F (36.1 C), Max:96.9 F (36.1 C)  Recent Labs  Lab 02/11/22 1003 02/17/2022 2032  WBC  --  15.1*  CREATININE 2.85*  --   LATICACIDVEN  --  >9.0*    Estimated Creatinine Clearance: 17.7 mL/min (A) (by C-G formula based on SCr of 2.85 mg/dL (H)).    Allergies  Allergen Reactions   Ibuprofen Other (See Comments)    Avoid due to stage 3 kidney disease      Antimicrobials this admission: Vanc 11/11  >>  Cefe 11/11 >>  MTZ 11/11 >>   Dose adjustments this admission: N/a  Microbiology results: 11/11 BCx: pend 11/11 UCx: pend   Thank you for allowing pharmacy to be a part of this patient's care.   LydBenetta SparharmD, BCPS, BCCP Clinical Pharmacist  Please check AMION for all [facility] numbers After 10:00 PM, call [phone]

## 2022-02-13 NOTE — ED Notes (Signed)
Patient stating "I can't breath" decreased SPO2@88 %, MD called back in room at this time . Patient placed on non-rebreather.

## 2022-02-13 NOTE — Telephone Encounter (Signed)
   The patient's niece called the answering service after-hours today. Per answering service page, she has been having a lot of trouble breathing and has only urinated twice today. Note from 02/11/22 reviewed. SOB with any activity. Recent low BPs. Recent marked volume overload, followed by volume depletion. On 11/9, Optivol was way down. Dr. Haroldine Laws worried this could represent end stage HF, not a candidate for advanced therapies, if no improvement consider RHC. I tried to call niece twice and it goes right to VM. She did call back a few moments later. She confirmed that the patient is having the issues above and they're on the way to the hospital. She inquired about whether they could go to freestanding ER to be seen sooner but I think given what is outlined in the chart, [facility] is likely the best place for her to land given the availability of cardiac services if needed. Niece agrees and verbalized understanding and gratitude. Will cc to Dr. Haroldine Laws as Jul[facility] Rainier.  Charlie Pitter, PA-C

## 2022-02-13 NOTE — ED Notes (Signed)
RT at bedside.

## 2022-02-13 NOTE — ED Notes (Signed)
Patient and family agreed for patient to be comfort care. MD present

## 2022-02-13 NOTE — ED Provider Triage Note (Signed)
Emergency Medicine Provider Triage Evaluation Note  Katherine Rosales , a 71 y.o. female  was evaluated in triage.  Pt complains of shortness of breath.  Patient reports this began yesterday.  Patient reports she has a history of CHF, takes torsemide only when her weight is over 200 pounds.  Patient states that she has been sleeping terribly, having a proper self up at night.  Patient also endorsing leg swelling.  Patient denies any fevers, chest pain, nausea, vomiting.  Review of Systems  Positive:  Negative:   Physical Exam  BP (!) 83/72 (BP Location: Left Arm)   Pulse 77   Resp 16   SpO2 99%  Gen:   Awake, no distress   Resp:  Normal effort  MSK:   Moves extremities without difficulty  Other:  Decreased lung sounds bilaterally.  No obvious pitting edema.  Medical Decision Making  Medically screening exam initiated at 8:10 PM.  Appropriate orders placed.  Katherine Rosales was informed that the remainder of the evaluation will be completed by another provider, this initial triage assessment does not replace that evaluation, and the importance of remaining in the ED until their evaluation is complete.     Azucena Cecil, PA-C 02/12/2022 2010

## 2022-02-13 NOTE — ED Provider Notes (Signed)
[facility] EMERGENCY DEPARTMENT Provider Note   CSN: [id number] Arrival date & time: 02/08/2022  1757     History  Chief Complaint  Patient presents with   Shortness of Breath    Katherine Rosales is a 71 y.o. female.  HPI 71 yo female ho chf, stage 4 ckd, took torsemide 40 mg due to weight at 205 as weight over 200. Increased dyspnea since yesterday.  Dry weight 195-200. Follows at heart failure clinic in [place]- Dr. Sung Amabile.  BP normal low in nineties, now off bp meds and niece reports that it was 100/66, here sbp 80s No oxygen, able to walk to bathroom One month ago, Dr. [name] stopped spironolactone, losartan and carvedilol New cough on 2 different abx, has been on doxy and augmentin- both completed.  No fever or chills.  NO lung disease no ho smoking   From Dr. Letha Cape note of 11/9    [facility]  NOTE   Referring Physician:Steven [name], MD   Primary Care: Dion Body, MD Primary Cardiologist: [name], MD     HPI:   Katherine Rosales is a 71 y.o. female with a hx of obesity, HTN, DM2, CKD IIIB-IV, OSA, systolic HF due to NICM s/p ICD (medtronic), last EF 30-35%, parox A. Fib s/p DCCV (02/13/2020, 05/2021 ) and h/o breast CA s/p chemo/XRT who is referred by Dr. [name] for further evaluation of her HF.    HF dates back to at least 2014. Had remote cath with non-obstructive CAD but I cannot find record of it in our system. 12/2018 Echo: EF 30-35%; d. 08/2020    Most recent Echo (5/22): EF 20-25%, glob HK. Mod elev RVSP. Mod dil LA, mild to mod RAE. Mod MR/TR. Mild-mod AoV sclerosis.   Here with her daughter. Now living with her daughter x 2 months. Up until 2 weeks ago was relatively stable NYHA III could do all her ADLs. Over the last 2 weeks. More SOB. Coughing up brown stuff. No fevers or chills. Now SOB with any activity. Can barely get around the house. Tested for COVID/RSV/flu and negative. Treated with amoxicillin by  PCP. Coreg, losartan, Aldactone all recently stopped due to low blood pressures.  I saw her 2 weeks ago with marked volume overloaded and NYHA IV symptoms. Not responding to increase oral diuretics. Started on Furoscix. Saw her last week and was down 20 pounds very volume depleted. Weight down to 195. Received IVF.    Returns today with her daughter. Weight ranging 197-201. Takes torsemide 40 when weight 200 or greater. Still dizzy but started back doing laundry and doing dishes. Poor appetite. No CP. Dyspnea with mild activity. Still with cough. Has had 2 different abx augmentin and doxy without relief   ICD: Optivol way down. Now flat.  No VT Activity level decreased from 4hr/day to < 1hr.day Personally reviewed           Home Medications Prior to Admission medications   Medication Sig Start Date End Date Taking? Authorizing Provider  amiodarone (PACERONE) 200 MG tablet Take 1 tablet (200 mg total) by mouth daily. 12/03/21   Deboraha Sprang, MD  apixaban (ELIQUIS) 5 MG TABS tablet TAKE 1 TABLET BY MOUTH TWICE DAILY 09/04/21   Deboraha Sprang, MD  Ascorbic Acid (VITAMIN C) 1000 MG tablet Take 1,000 mg by mouth in the morning.    [provider]  Calcium Carb-Cholecalciferol (CALCIUM 600 + D PO) Take 1 tablet by mouth  in the morning.    [provider]  cetirizine (ZYRTEC) 10 MG tablet Take 10 mg by mouth daily. Patient not taking: Reported on 02/11/2022    [provider]  CINNAMON PO Take 1,000 mg by mouth in the morning.    [provider]  denosumab (PROLIA) 60 MG/ML SOSY injection Inject 60 mg into the skin every 6 (six) months.    [provider]  dextromethorphan-guaiFENesin (MUCINEX DM) 30-600 MG 12hr tablet Take 1 tablet by mouth 2 (two) times daily.    [provider]  docusate sodium (COLACE) 100 MG capsule Take 300 mg by mouth at bedtime.     [provider]  DULoxetine (CYMBALTA) 30 MG capsule Take 30 mg by mouth daily.  01/19/22   [provider]  DULoxetine (CYMBALTA) 60 MG capsule Take 60 mg by mouth daily. 08/17/19   [provider]  empagliflozin (JARDIANCE) 25 MG TABS tablet Take 25 mg by mouth daily.    [provider]  fluticasone (FLONASE) 50 MCG/ACT nasal spray Place 2 sprays into both nostrils at bedtime.    [provider]  Furosemide (FUROSCIX) 80 MG/10ML CTKT Inject 80 mg into the skin as directed. Patient not taking: Reported on 02/11/2022 01/27/22   Bensimhon, Shaune Pascal, MD  gabapentin (NEURONTIN) 600 MG tablet Take 600 mg by mouth 2 (two) times daily.    [provider]  glimepiride (AMARYL) 2 MG tablet Take 1 tablet (2 mg) by mouth twice daily    [provider]  levothyroxine (SYNTHROID) 125 MCG tablet TAKE 1 TABLET IN THE MORNING BEFORE BREAKFAST 10/30/21   Deboraha Sprang, MD  metolazone (ZAROXOLYN) 5 MG tablet Take 5 mg by mouth daily as needed (for swelling). Take 30 minutes before Torsemide    [provider]  Multiple Vitamin (MULTIVITAMIN WITH MINERALS) TABS tablet Take 1 tablet by mouth daily. Centrum Silver    [provider]  Multiple Vitamins-Minerals (OCUVITE PO) Take 1 tablet by mouth in the morning.    [provider]  omeprazole (PRILOSEC) 20 MG capsule Take 20 mg by mouth in the morning.    [provider]  potassium chloride SA (KLOR-CON M) 20 MEQ tablet Take 2 tablets (40 mEq total) by mouth 3 (three) times daily for 1 day, THEN 1 tablet (20 mEq total) daily. 02/02/22 02/03/23  Bensimhon, Shaune Pascal, MD  Sodium Fluoride (PREVIDENT 5000 BOOSTER PLUS) 1.1 % PSTE After flossing, brush this paste on all teeth for two full minutes at bedtime, Spit out excess but do not rinse out or eat/drink for 30 min. 01/12/22   [provider]  torsemide (DEMADEX) 20 MG tablet Take 2 tablets (40 mg total) by mouth daily. Increase to 40 mg BID for wt 206 or greater 02/02/22   Bensimhon, Shaune Pascal, MD       Allergies    Ibuprofen    Review of Systems   Review of Systems  Physical Exam Updated Vital Signs BP 105/77   Pulse 65   Temp (!) 96.9 F (36.1 C) (Rectal)   Resp 16   SpO2 100%  Physical Exam Vitals reviewed.  Eyes:     Pupils: Pupils are equal, round, and reactive to light.     Comments: Conjunctiva are pale  Pulmonary:     Breath sounds: Examination of the right-middle field reveals rhonchi. Examination of the left-middle field reveals rhonchi. Examination of the right-lower field reveals rhonchi. Examination of the left-lower field reveals rhonchi.  Rhonchi present.  Neurological:     Mental Status: She is alert.     ED Results / Procedures / Treatments   Labs (all labs ordered are listed, but only abnormal results are displayed) Labs Reviewed  CBC - Abnormal; Notable for the following components:      Result Value   WBC 15.1 (*)    RDW 16.4 (*)    nRBC 0.5 (*)    All other components within normal limits  BRAIN NATRIURETIC PEPTIDE - Abnormal; Notable for the following components:   B Natriuretic Peptide 2,121.6 (*)    All other components within normal limits  LACTIC ACID, PLASMA - Abnormal; Notable for the following components:   Lactic Acid, Venous >9.0 (*)    All other components within normal limits  LACTIC ACID, PLASMA - Abnormal; Notable for the following components:   Lactic Acid, Venous >9.0 (*)    All other components within normal limits  I-STAT ARTERIAL BLOOD GAS, ED - Abnormal; Notable for the following components:   pH, Arterial 7.183 (*)    pCO2 arterial 20.8 (*)    pO2, Arterial 169 (*)    Bicarbonate 8.0 (*)    TCO2 9 (*)    Acid-base deficit 19.0 (*)    Sodium 121 (*)    Potassium 5.7 (*)    Calcium, Ion 0.99 (*)    Hemoglobin 15.3 (*)    All other components within normal limits  TROPONIN I (HIGH SENSITIVITY) - Abnormal; Notable for the following components:   Troponin I (High Sensitivity) 24 (*)    All other components within  normal limits  SARS CORONAVIRUS 2 BY RT PCR  CULTURE, BLOOD (ROUTINE X 2)  CULTURE, BLOOD (ROUTINE X 2)  RESP PANEL BY RT-PCR (FLU A&B, COVID) ARPGX2  CULTURE, BLOOD (ROUTINE X 2)  CULTURE, BLOOD (ROUTINE X 2)  URINE CULTURE  MRSA CULTURE  LACTIC ACID, PLASMA  COMPREHENSIVE METABOLIC PANEL  PROTIME-INR  APTT  URINALYSIS, ROUTINE W REFLEX MICROSCOPIC  BASIC METABOLIC PANEL  I-STAT ARTERIAL BLOOD GAS, ED  TROPONIN I (HIGH SENSITIVITY)    EKG EKG Interpretation  Date/Time:  Saturday February 13 2022 20:11:16 EST Ventricular Rate:  71 PR Interval:  252 QRS Duration: 186 QT Interval:  552 QTC Calculation: 599 R Axis:   -72 Text Interpretation: Atrial fibrillation Left axis deviation Non-specific intra-ventricular conduction block Possible Lateral infarct , age undetermined Abnormal ECG When compared with ECG of 08-May-2021 07:01, PREVIOUS ECG IS PRESENT diffuse st elevation with qrs widening more pronounced from first prior ekg of 08 May 2021 Confirmed by Pattricia Boss [phone]) on 02/08/2022 8:46:34 PM  Radiology DG Chest Port 1 View  Result Date: 02/04/2022 CLINICAL DATA:  dyspnea EXAM: PORTABLE CHEST 1 VIEW COMPARISON:  Chest x-Jamieson Lisa 10/26/2021 FINDINGS: Left chest wall dual lead cardiac device in stable position. Enlarged cardiac silhouette. The heart and mediastinal contours are unchanged. Question retrocardiac airspace opacity. No pulmonary edema. No pleural effusion. No pneumothorax. No acute osseous abnormality. IMPRESSION: 1. Question retrocardiac airspace opacity recommend repeat PA and lateral view of the chest with improved inspiratory effort. 2. Stable cardiomegaly. Electronically Signed   By: Iven Finn M.D.   On: 02/04/2022 21:01    Procedures .Critical Care  Performed by: Pattricia Boss, MD Authorized by: Pattricia Boss, MD   Critical care provider statement:    Critical care time (minutes):  80   Critical care was necessary to treat or prevent imminent or  life-threatening deterioration of the following conditions:  Shock, sepsis and respiratory failure   Critical care was time spent personally by me on the following activities:  Development of treatment plan with patient or surrogate, discussions with consultants, evaluation of patient's response to treatment, examination of patient, ordering and review of laboratory studies, ordering and review of radiographic studies, ordering and performing treatments and interventions, pulse oximetry, re-evaluation of patient's condition and review of old charts     Medications Ordered in ED Medications  lactated ringers infusion ( Intravenous Canceled Entry 02/12/2022 2322)  metroNIDAZOLE (FLAGYL) IVPB 500 mg (has no administration in time range)  acetaminophen (TYLENOL) tablet 650 mg (has no administration in time range)    Or  acetaminophen (TYLENOL) suppository 650 mg (has no administration in time range)  ondansetron (ZOFRAN-ODT) disintegrating tablet 4 mg (has no administration in time range)    Or  ondansetron (ZOFRAN) injection 4 mg (has no administration in time range)  polyvinyl alcohol (LIQUIFILM TEARS) 1.4 % ophthalmic solution 1 drop (has no administration in time range)  morphine (PF) 2 MG/ML injection 2 mg (2 mg Intravenous Given 02/03/2022 2316)  morphine 182m in NS 1057m(7m4mL) infusion - premix (1 mg/hr Intravenous New Bag/Given 02/27/2022 2242)  vancomycin (VANCOREADY) IVPB 750 mg/150 mL (has no administration in time range)  ceFEPIme (MAXIPIME) 1 g in sodium chloride 0.9 % 100 mL IVPB (has no administration in time range)  ceFEPIme (MAXIPIME) 2 g in sodium chloride 0.9 % 100 mL IVPB (0 g Intravenous Stopped 02/28/2022 2218)  lactated ringers bolus 500 mL (500 mLs Intravenous New Bag/Given 02/16/2022 2151)  vancomycin (VANCOREADY) IVPB 1750 mg/350 mL (0 mg Intravenous Stopped 02/25/2022 2218)  morphine (PF) 2 MG/ML injection 2 mg (2 mg Intravenous Given 02/21/2022 2218)    ED Course/ Medical  Decision Making/ A&P Clinical Course as of 02/06/2022 2324  Sat Feb 13, 2022  2138 Chest x-Gloria Lambertson reviewed and interpreted and significant for bedside AP view, radiologist questions retrocardiac airspace opacity and stable cardiomegaly [DR]  2139 ABG reviewed and interpreted with pH of 7.18, PCO2 20, PO2 of 169 this was done on nonrebreather and significant for the metabolic acidosis [DR]    Clinical Course User Index [DR] RayPattricia BossD                           Medical Decision Making Code status discussed with niece at bedside- DNR DNI niece is poa 71 14ar old female with known history of CHF due to nonischemic cardiomyopathy with last EF 25% % and paroxysmal atrial fibrillation with history of breast cancer who has been being followed by Dr. [facility]Haroldine [facility] clinic.  Dry weight is 1 95-2 01.  Recently she had had her weight down to 195 and was hypotensive and required IV fluids.  However, over 200 pounds they increase her dose of diuretic due to usual volume overload.  Additionally she has had cough over the past month and has been treated with both amoxicillin and doxycycline.  She has had several COVID test that was negative. She presents today with increased dyspnea and hypotension. Differential diagnosis includes but is not limited to congestive heart failure, pneumonia, PE, EKG with diffuse winding and ST elevation Chest x-Larkin Alfred without obvious volume overload although baseline cardiomegaly Patient with metabolic acidosis with pH of 7.1 She does not wish intubation or CPR She has had blood cultures obtained is being given broad-spectrum antibiotics and 500 cc of IV fluid with blood pressure initially at 80. Blood pressure is  now increased to 105/77. Awaiting remainder of labs Patient with increasing dyspnea.   Amount and/or Complexity of Data Reviewed Labs: ordered. Decision-making details documented in ED Course. Radiology: ordered and independent interpretation performed. Decision-making  details documented in ED Course. ECG/medicine tests: ordered and independent interpretation performed. Decision-making details documented in ED Course. Discussion of management or test interpretation with external provider(s): Maintain on monitor till discontinued when decision for palliative care  Risk OTC drugs. Prescription drug management. Parenteral controlled substances. Decision not to resuscitate or to de-escalate care because of poor prognosis.  1- dyspnea- patient with some low sats and started on [place] then dropped sats and nrb started- abg on nrb with po2 175.  CXR with ? Of retrocardiac airspace disease. Would consider ct once renal function back and if patient stabilizes to go to ct 2- hypotension- patient hypotensive here sbp in 80s.  She has significantly decreased ef at 20-25% and has had to have her antihypertenives stopped due to low blood pressure.  She had been on increasing oral diuretics and her weight was down 20 pounds and she became volume depleted.  She required IV fluids for blood pressure support in clinic during the past several weeks. 3-metabolic acidosis-consider volume depletion versus sepsis.  Patient has had cough and been treated with antibiotics several times.  Here she does have a question of infiltrate on plain chest x-Saida Lonon.  She has had blood cultures obtained, lactic acid sent, and is receiving broad-spectrum antibiotics and a fluid bolus of 500 cc 4- DN R/DNI-patient's niece who is power of attorney is at bedside Discussed with niece treating for sepsis could exacerbate her CHF.  Her niece states that they do not wish to have her intubated and that she does not wish intubation.  After discussion, request comfort care.  I discussed this in detail with patient's niece who is power of attorney and they also discussed via phone with her sister.  They would like to do comfort care at this point. I had already paged critical care and cardiology.  We will standby if  there is need.  However, given request for comfort care we will discontinue antibiotics and not redraw labs at this time. Morphine IV is ordered and patient is placed back on nonrebreather  Discussed with Dr. Bridgett Larsson and she will see for admission       Final Clinical Impression(s) / ED Diagnoses Final diagnoses:  Hypotension, unspecified hypotension type    Rx / DC Orders ED Discharge Orders     None         Pattricia Boss, MD 02/15/2022 2324

## 2022-02-13 NOTE — Progress Notes (Signed)
Pt being followed by [facility] for Sepsis protocol. 

## 2022-02-13 NOTE — H&P (Signed)
History and Physical    Katherine Rosales BWL:[id number] DOB: March 21, 1951 DOA: 02/11/2022  PCP: Dion Body, MD  Patient coming from: Home  Chief Complaint: Shortness of breath  HPI: Katherine Rosales is a 71 y.o. female with medical history significant of chronic systolic CHF secondary to NICM status post AICD (EF 20 to 25% per echo 08/2020), paroxysmal A-fib status post DCCV (02/2020, 05/2021), CKD stage IIIb-IV, history of breast cancer status post chemo/XRT, depression, type 2 diabetes, hypertension, hypothyroidism, NASH, OSA on CPAP, class III obesity.  She was seen by cardiology on 01/27/2022 for marked volume overload and NYHA IV symptoms not responding to increased oral diuretics and was started on Furoscix.  Seen at cardiology clinic again on 10/31 and weight noted to be down by 20 pounds/very volume depleted and orthostatic.  Diuretics stopped and she was given IV fluids.  She returned to cardiology office on 11/9 with worsening dyspnea/end-stage heart failure.  Labs done during this visit showing worsening kidney function - creatinine 2.8 (baseline 1.8-2.0).  Cardiology felt that she was not a candidate for advanced therapies due to CKD stage IV and other comorbidities.  Plan was to consider right heart cath if not improving.  She was advised to continue torsemide and Jardiance.    Patient presents to the ED today due to lethargy, worsening dyspnea, weight gain, bilateral lower extremity edema, and decreased urine output.  In the ED, patient noted to be hypoxic to the 80s and placed on nonrebreather.  Hypotensive on arrival with blood pressure 83/72.  Temperature 96.9 F.  Labs showing WBC 15.1, BNP 2121.6, high sensitive troponin 24, SARS-CoV-2 PCR negative, lactic acid >9.0. ABG on nonrebreather showing pH 7.18, PCO2 20.8, PO2 169, bicarb 8.0.  I-STAT chemistry showing sodium 121, potassium 5.7.  CMP, PT/INR, APTT, and UA not drawn.  Blood culture pending.  Chest x-ray showing  questionable retrocardiac airspace opacity.  EKG showing A-fib, diffuse ST elevations, and QRS widening more pronounced compared to prior EKG. Patient was given broad-spectrum antibiotics and 500 cc fluid bolus.  Patient blood pressure continued to drop and ED physician subsequently had a goals of care discussion with the patient's family including her niece/HCPOA and she was made DNR/ DNI. Family made the decision to transition her to full comfort care measures. She was placed on morphine infusion and PRN morphine by ED physician after discussion with family and [facility] called to admit for full comfort care.   At the time of my arrival, patient was somnolent and constantly moaning.  She was able to open her eyes briefly and whisper her name and able to recognize some of her family members.  She continued to moan and was not able to give any additional history.  She was not able to tell me if she is in pain. Several family members present at bedside including her niece Katherine Rosales who is also her 12.  Family informed me that they know that the patient is actively dying and confirmed that she is DNR/DNI.  Family confirmed that they do not want any further treatments/lab draws and requested full comfort care measures only.  Family is okay with continuing morphine infusion and morphine PRN pain/dyspnea.   Review of Systems:  Review of Systems  Reason unable to perform ROS: AMS.    Past Medical History:  Diagnosis Date   6949 Lead-Medtronic    Anemia 11/12/2016   Anxiety 12/25/2013   Automatic implantable cardioverter-defibrillator in situ    Breast cancer ([place]) 03/1995  right breast ca with lumpectomy and chemo and rad   Cardiomyopathy, nonischemic ([place])    a. Prior cath w/ nonobs CAD; b. s/p AICD w/ subsequent upgrade 11/2012-> MDT Evera XT VR single lead ICD (ser # [id number]); c. 12/2018 Echo: EF 30-35%; d. 08/2020 Echo: EF 20-25%, glob HK. Mod elev RVSP. Mod dil LA, mild to mod RAE. Mod MR/TR. Mild-mod  AoV sclerosis.   Chronic HFrEF (heart failure with reduced ejection fraction) ([place])    a. 12/2018 Echo: EF 30-35%; b. 08/2020 Echo: EF 20-25%, glob HK.   CKD (chronic kidney disease), stage III ([place]) 11/12/2016   Depression    Diabetes mellitus    Type II Diabetes   Hypertension    Hypothyroidism    ICD (implantable cardiac defibrillator), singleMedtronic    NASH (nonalcoholic steatohepatitis)    OSA (obstructive sleep apnea)    a. On CPAP.   PAF (paroxysmal atrial fibrillation) ([place])    a. 09/2020 s/p DCCV.   Personal history of chemotherapy 1996   right breast ca   Personal history of radiation therapy 1996   right breast ca   PVC's (premature ventricular contractions)    a. 08/2020 Zio: 8.1% PVC burden.   Sleep apnea    On CPAP   Spleen enlarged    Systolic heart failure, chronic ([place])     Past Surgical History:  Procedure Laterality Date   ABDOMINAL HYSTERECTOMY     BREAST BIOPSY Right 11/26/2015   - FAT NECROSIS, FIBROSIS, AND CHRONIC INFLAMMATION.    BREAST BIOPSY Right 12/20/2018   Affirm Bx- X-clip- path pending   BREAST BIOPSY WITH RADIO FREQUENCY LOCALIZER Right 10/18/2019   Procedure: RF TAG GUIDED EXCISIONAL BREAST BIOPSY;  Surgeon: Benjamine Sprague, DO;  Location: [facility] ORS;  Service: General;  Laterality: Right;   BREAST EXCISIONAL BIOPSY Right 03/1995   + breast ca chemo and rad. 9:00 region   BREAST EXCISIONAL BIOPSY Right 2011   surgical bx neg  11:00 region   BREAST LUMPECTOMY Right 1996   CARDIOVERSION N/A 02/13/2020   Procedure: CARDIOVERSION;  Surgeon: Kate Sable, MD;  Location: [facility] ORS;  Service: Cardiovascular;  Laterality: N/A;   CARDIOVERSION N/A 09/29/2020   Procedure: CARDIOVERSION;  Surgeon: Wellington Hampshire, MD;  Location: [facility];  Service: Cardiovascular;  Laterality: N/A;   CARDIOVERSION N/A 05/08/2021   Procedure: CARDIOVERSION;  Surgeon: Minna Merritts, MD;  Location: [facility] ORS;  Service: Cardiovascular;  Laterality: N/A;    CHOLECYSTECTOMY     COLONOSCOPY  03/05/2013   COLONOSCOPY W/ POLYPECTOMY  03/05/2013   COLONOSCOPY WITH PROPOFOL N/A 10/03/2017   Procedure: COLONOSCOPY WITH PROPOFOL;  Surgeon: Manya Silvas, MD;  Location: [facility] ENDOSCOPY;  Service: Endoscopy;  Laterality: N/A;   ESOPHAGOGASTRODUODENOSCOPY  01/11/2003, 06/25/2015   ESOPHAGOGASTRODUODENOSCOPY (EGD) WITH PROPOFOL N/A 06/25/2015   Procedure: ESOPHAGOGASTRODUODENOSCOPY (EGD) WITH PROPOFOL;  Surgeon: Manya Silvas, MD;  Location: [facility] ENDOSCOPY;  Service: Endoscopy;  Laterality: N/A;   FOOT SURGERY Right    IMPLANTABLE CARDIOVERTER DEFIBRILLATOR (ICD) GENERATOR CHANGE N/A 11/15/2012   Procedure: ICD GENERATOR CHANGE;  Surgeon: Deboraha Sprang, MD;  Location: [facility] CATH LAB;  Service: Cardiovascular;  Laterality: N/A;   JOINT REPLACEMENT  2009   Total Knee Replacement, Left   LEAD REVISION N/A 11/15/2012   Procedure: LEAD REVISION;  Surgeon: Deboraha Sprang, MD;  Location: [facility] CATH LAB;  Service: Cardiovascular;  Laterality: N/A;   PACEMAKER INSERTION  06/30/2005   Medtronic Virtuoso VR ICD - Nonischemic cardiomyopathy potentially related to  adriamycin, class 3 congestive failture with a narrow QRS and a negative TDI   TONSILLECTOMY     TUBAL LIGATION       reports that she has never smoked. She has never used smokeless tobacco. She reports that she does not drink alcohol and does not use drugs.  Allergies  Allergen Reactions   Ibuprofen Other (See Comments)    Avoid due to stage 3 kidney disease      Family History  Problem Relation Age of Onset   Prostate cancer Father    Breast cancer Sister 69    Prior to Admission medications   Medication Sig Start Date End Date Taking? Authorizing Provider  amiodarone (PACERONE) 200 MG tablet Take 1 tablet (200 mg total) by mouth daily. 12/03/21   Deboraha Sprang, MD  apixaban (ELIQUIS) 5 MG TABS tablet TAKE 1 TABLET BY MOUTH TWICE DAILY 09/04/21   Deboraha Sprang, MD  Ascorbic Acid (VITAMIN C) 1000  MG tablet Take 1,000 mg by mouth in the morning.    [provider]  Calcium Carb-Cholecalciferol (CALCIUM 600 + D PO) Take 1 tablet by mouth in the morning.    [provider]  cetirizine (ZYRTEC) 10 MG tablet Take 10 mg by mouth daily. Patient not taking: Reported on 02/11/2022    [provider]  CINNAMON PO Take 1,000 mg by mouth in the morning.    [provider]  denosumab (PROLIA) 60 MG/ML SOSY injection Inject 60 mg into the skin every 6 (six) months.    [provider]  dextromethorphan-guaiFENesin (MUCINEX DM) 30-600 MG 12hr tablet Take 1 tablet by mouth 2 (two) times daily.    [provider]  docusate sodium (COLACE) 100 MG capsule Take 300 mg by mouth at bedtime.     [provider]  DULoxetine (CYMBALTA) 30 MG capsule Take 30 mg by mouth daily. 01/19/22   [provider]  DULoxetine (CYMBALTA) 60 MG capsule Take 60 mg by mouth daily. 08/17/19   [provider]  empagliflozin (JARDIANCE) 25 MG TABS tablet Take 25 mg by mouth daily.    [provider]  fluticasone (FLONASE) 50 MCG/ACT nasal spray Place 2 sprays into both nostrils at bedtime.    [provider]  Furosemide (FUROSCIX) 80 MG/10ML CTKT Inject 80 mg into the skin as directed. Patient not taking: Reported on 02/11/2022 01/27/22   Bensimhon, Shaune Pascal, MD  gabapentin (NEURONTIN) 600 MG tablet Take 600 mg by mouth 2 (two) times daily.    [provider]  glimepiride (AMARYL) 2 MG tablet Take 1 tablet (2 mg) by mouth twice daily    [provider]  levothyroxine (SYNTHROID) 125 MCG tablet TAKE 1 TABLET IN THE MORNING BEFORE BREAKFAST 10/30/21   Deboraha Sprang, MD  metolazone (ZAROXOLYN) 5 MG tablet Take 5 mg by mouth daily as needed (for swelling). Take 30 minutes before Torsemide    [provider]  Multiple Vitamin (MULTIVITAMIN WITH MINERALS) TABS tablet Take 1 tablet by mouth daily. Centrum Silver     [provider]  Multiple Vitamins-Minerals (OCUVITE PO) Take 1 tablet by mouth in the morning.    [provider]  omeprazole (PRILOSEC) 20 MG capsule Take 20 mg by mouth in the morning.    [provider]  potassium chloride SA (KLOR-CON M) 20 MEQ tablet Take 2 tablets (40 mEq total) by mouth 3 (three) times daily for 1 day, THEN 1 tablet (20 mEq total) daily. 02/02/22  02/03/23  Bensimhon, Shaune Pascal, MD  Sodium Fluoride (PREVIDENT 5000 BOOSTER PLUS) 1.1 % PSTE After flossing, brush this paste on all teeth for two full minutes at bedtime, Spit out excess but do not rinse out or eat/drink for 30 min. 01/12/22   [provider]  torsemide (DEMADEX) 20 MG tablet Take 2 tablets (40 mg total) by mouth daily. Increase to 40 mg BID for wt 206 or greater 02/02/22   Bensimhon, Shaune Pascal, MD    Physical Exam: Vitals:   02/17/2022 2030 02/05/2022 2045 02/19/2022 2102 03/04/2022 2105  BP: 94/68 92/69 105/77   Pulse: 63 63 65   Resp: (!) 32 20 16   Temp:    (!) 96.9 F (36.1 C)  TempSrc:    Rectal  SpO2: 97% 100% 100%     Physical Exam Vitals reviewed.  Constitutional:      General: She is in acute distress.  HENT:     Head: Normocephalic and atraumatic.  Cardiovascular:     Rate and Rhythm: Normal rate and regular rhythm.     Pulses: Normal pulses.  Pulmonary:     Effort: Pulmonary effort is normal.     Breath sounds: No wheezing.  Abdominal:     General: Bowel sounds are normal. There is no distension.     Palpations: Abdomen is soft.     Tenderness: There is no abdominal tenderness.  Musculoskeletal:     Cervical back: Normal range of motion.     Right lower leg: No edema.     Left lower leg: No edema.  Skin:    Comments: Extremities cool to touch  Neurological:     Mental Status: She is alert.     Comments: Somnolent but briefly arousable Constantly moaning Not following commands     Labs on Admission: I have personally reviewed following labs and  imaging studies  CBC: Recent Labs  Lab 03/04/2022 2032 02/04/2022 2100  WBC 15.1*  --   HGB 13.9 15.3*  HCT 44.5 45.0  MCV 94.5  --   PLT 270  --    Basic Metabolic Panel: Recent Labs  Lab 02/11/22 1003 02/03/2022 2100  NA 132* 121*  K 4.5 5.7*  CL 94*  --   CO2 22  --   GLUCOSE 188*  --   BUN 61*  --   CREATININE 2.85*  --   CALCIUM 9.3  --    GFR: Estimated Creatinine Clearance: 17.7 mL/min (A) (by C-G formula based on SCr of 2.85 mg/dL (H)). Liver Function Tests: No results for input(s): "AST", "ALT", "ALKPHOS", "BILITOT", "PROT", "ALBUMIN" in the last 168 hours. No results for input(s): "LIPASE", "AMYLASE" in the last 168 hours. No results for input(s): "AMMONIA" in the last 168 hours. Coagulation Profile: No results for input(s): "INR", "PROTIME" in the last 168 hours. Cardiac Enzymes: No results for input(s): "CKTOTAL", "CKMB", "CKMBINDEX", "TROPONINI" in the last 168 hours. BNP (last 3 results) No results for input(s): "PROBNP" in the last 8760 hours. HbA1C: No results for input(s): "HGBA1C" in the last 72 hours. CBG: No results for input(s): "GLUCAP" in the last 168 hours. Lipid Profile: No results for input(s): "CHOL", "HDL", "LDLCALC", "TRIG", "CHOLHDL", "LDLDIRECT" in the last 72 hours. Thyroid Function Tests: No results for input(s): "TSH", "T4TOTAL", "FREET4", "T3FREE", "THYROIDAB" in the last 72 hours. Anemia Panel: No results for input(s): "VITAMINB12", "FOLATE", "FERRITIN", "TIBC", "IRON", "RETICCTPCT" in the last 72 hours. Urine analysis: No results found for: "COLORURINE", "APPEARANCEUR", "LABSPEC", "PHURINE", "GLUCOSEU", "  HGBUR", "BILIRUBINUR", "KETONESUR", "PROTEINUR", "UROBILINOGEN", "NITRITE", "LEUKOCYTESUR"  Radiological Exams on Admission: DG Chest Port 1 View  Result Date: 02/19/2022 CLINICAL DATA:  dyspnea EXAM: PORTABLE CHEST 1 VIEW COMPARISON:  Chest x-ray 10/26/2021 FINDINGS: Left chest wall dual lead cardiac device in stable position.  Enlarged cardiac silhouette. The heart and mediastinal contours are unchanged. Question retrocardiac airspace opacity. No pulmonary edema. No pleural effusion. No pneumothorax. No acute osseous abnormality. IMPRESSION: 1. Question retrocardiac airspace opacity recommend repeat PA and lateral view of the chest with improved inspiratory effort. 2. Stable cardiomegaly. Electronically Signed   By: Iven Finn M.D.   On: 02/11/2022 21:01    Assessment and Plan  Admission for end-of-life care Acute on chronic systolic CHF/end-stage heart failure Possible pneumonia Septic vs cardiogenic shock Acute hypoxemic respiratory failure AKI on CKD stage IV Hyperkalemia Hyponatremia Elevated troponin/possible ACS -Now DNR/DNI and full comfort care after discussion with family. -Continue morphine infusion and morphine PRN dyspnea/pain -Supplemental oxygen as needed -Palliative care consult -Anticipate in-hospital demise  Paroxysmal A-fib Type 2 diabetes Depression Hypertension Hypothyroidism -Full comfort care  DVT prophylaxis: None, full comfort care Code Status: DNR/DNI Level of care: Med-Surg  Shela Leff MD [facility]  If 7PM-7AM, please contact night-coverage [email or web address]  02/27/2022, 11:45 PM

## 2022-02-13 NOTE — ED Notes (Signed)
Patient and family took all oxygen off themselves

## 2022-02-14 DIAGNOSIS — R570 Cardiogenic shock: Secondary | ICD-10-CM | POA: Insufficient documentation

## 2022-02-14 DIAGNOSIS — J189 Pneumonia, unspecified organism: Secondary | ICD-10-CM | POA: Insufficient documentation

## 2022-02-14 DIAGNOSIS — J9601 Acute respiratory failure with hypoxia: Secondary | ICD-10-CM | POA: Insufficient documentation

## 2022-02-14 DIAGNOSIS — I5084 End stage heart failure: Secondary | ICD-10-CM | POA: Insufficient documentation

## 2022-02-14 DIAGNOSIS — A419 Sepsis, unspecified organism: Secondary | ICD-10-CM | POA: Insufficient documentation

## 2022-02-14 DIAGNOSIS — F32A Depression, unspecified: Secondary | ICD-10-CM | POA: Insufficient documentation

## 2022-02-14 DIAGNOSIS — I10 Essential (primary) hypertension: Secondary | ICD-10-CM | POA: Insufficient documentation

## 2022-02-14 DIAGNOSIS — I480 Paroxysmal atrial fibrillation: Secondary | ICD-10-CM | POA: Insufficient documentation

## 2022-02-14 DIAGNOSIS — E871 Hypo-osmolality and hyponatremia: Secondary | ICD-10-CM | POA: Insufficient documentation

## 2022-02-14 DIAGNOSIS — E875 Hyperkalemia: Secondary | ICD-10-CM | POA: Insufficient documentation

## 2022-02-14 DIAGNOSIS — R7989 Other specified abnormal findings of blood chemistry: Secondary | ICD-10-CM | POA: Insufficient documentation

## 2022-02-14 LAB — RESP PANEL BY RT-PCR (FLU A&B, COVID) ARPGX2
Influenza A by PCR: NEGATIVE
Influenza B by PCR: NEGATIVE
SARS Coronavirus 2 by RT PCR: NEGATIVE

## 2022-02-14 MED ORDER — MORPHINE SULFATE (PF) 2 MG/ML IV SOLN
2.0000 mg | INTRAVENOUS | Status: DC | PRN
  Administered 2022-02-14 (×2): 2 mg via INTRAVENOUS
  Filled 2022-02-14 (×2): qty 1

## 2022-02-15 ENCOUNTER — Encounter: Payer: Self-pay | Admitting: Internal Medicine

## 2022-02-15 ENCOUNTER — Telehealth: Payer: Self-pay | Admitting: Internal Medicine

## 2022-02-15 NOTE — Telephone Encounter (Signed)
Gfollow Up:     Patient's niece is returning Heather's call.

## 2022-02-15 NOTE — Telephone Encounter (Signed)
I called and spoke with the patient's niece Katherine Rosales as I had called her earlier to offer condolences in regards to the patient's passing.   Per Katherine Rosales, the patient was SOB on Saturday, only urinated twice during the day on torsemide. Her weight was up to 204 lbs from 198 lbs at the end of last week. Blood test showed the patient was septic.  Katherine Rosales advised that the patient was offered intubation, but refused. Comfort measures put in place.  The patient passed yesterday morning peacefully.

## 2022-02-16 ENCOUNTER — Other Ambulatory Visit: Payer: Medicare HMO

## 2022-02-18 LAB — CULTURE, BLOOD (ROUTINE X 2): Culture: NO GROWTH

## 2022-03-03 ENCOUNTER — Other Ambulatory Visit: Payer: Medicare HMO

## 2022-03-05 NOTE — ED Notes (Addendum)
Family(niece)tearful. Pt having decreased respirations. Chaplain called.

## 2022-03-05 NOTE — Progress Notes (Signed)
   02/26/2022 1000  Clinical Encounter Type  Visited With Patient and family together  Visit Type ED;Death;Initial;Psychological support;Spiritual support  Referral From Nurse  Consult/Referral To Chaplain  Spiritual Encounters  Spiritual Needs Sacred text;Prayer;Ritual;Emotional;Grief support  Stress Factors  Patient Stress Factors None identified  Family Stress Factors Loss;Major life changes

## 2022-03-05 NOTE — ED Notes (Signed)
Family remains at bedside. Respirations decreased. Pt appears comfortable.

## 2022-03-05 NOTE — ED Notes (Signed)
Family at bedside. 

## 2022-03-05 NOTE — ED Notes (Signed)
Bed placement informed of pt death.

## 2022-03-05 NOTE — ED Notes (Signed)
Called medtronic to cancel

## 2022-03-05 NOTE — ED Notes (Signed)
Family at bedside, Patient resting comfortably, unable to follow very commands at this time. Will continue to monitor

## 2022-03-05 NOTE — ED Notes (Signed)
Family remains at bedside. Patient resting. Will continue to monitor

## 2022-03-05 NOTE — ED Notes (Signed)
Spoke with [employer] about coming to deactivate defibrillator and he agrees to come.

## 2022-03-05 NOTE — Death Summary Note (Signed)
DEATH SUMMARY   Patient Details  Name: Katherine Rosales MRN: [id number] DOB: March 21, 1951 URK:[id number], Lucianne Muss, MD Admission/Discharge Information   Admit Date:  03-12-22  Date of Death: Date of Death: Mar 13, 2022  Time of Death: Time of Death: 0927  Length of Stay: 0   Principle Cause of death: Acute on chronic systolic CHF  Hospital Diagnoses: Principal Problem:   End of life care Active Problems:   Hypothyroidism   Acute renal failure superimposed on stage 4 chronic kidney disease ([place])   Type 2 diabetes mellitus ([place])   Acute on chronic systolic CHF (congestive heart failure) (HCC)   End stage heart failure (HCC)   Pneumonia   Septic shock (HCC)   Cardiogenic shock ([place])   Acute hypoxemic respiratory failure (HCC)   Hyperkalemia   Hyponatremia   Elevated troponin   Paroxysmal A-fib ([place])   Depression   Hypertension   Hospital Course: 71 year old female with a history of chronic systolic CHF, EF 20 to 31%, paroxysmal atrial fibrillation, chronic kidney disease stage IV, prior history of breast cancer, depression, type 2 diabetes, hypertension, obstructive sleep apnea on CPAP, class III obesity, who is being followed by her cardiologist for issues regarding her volume status.  She was seen on 10/25 and noted to be volume overloaded.  Her oral diuretics were increased.  She had a significant amount of volume loss and became orthostatic.  She was followed up in cardiology clinic and diuretics were stopped, she was given IV fluids.  On further follow-up on 11/9, she had worsening shortness of breath.  Creatinine was elevated to 2.8 (baseline 1.8-2.0).  Cardiology did not feel she was a candidate for advanced therapies due to chronic kidney disease stage IV other comorbidities.  On the day of admission, she was seen in the emergency room with increasing weight gain, shortness of breath, bilateral lower extremity edema and decreased urine output.  She was hypoxic in the 80s and  hypotensive with a blood pressure of 83/72.  BNP was elevated at 2119/06/27.  She had a metabolic acidosis on ABG with a PCO2 of 20, bicarb of 8.  EKG showing atrial fibrillation with diffuse ST elevations.  Widening QRS.  Despite receiving some IV fluids, blood pressure remained low in the emergency room.  ED physician discussed goals of care with patient and her family.  She was made DNR.  Subsequently, family decided to transition to full comfort care.  The patient was placed on morphine infusion for shortness of breath.  She was monitored in the emergency room and subsequently passed away while still in the emergency room.  Family was at bedside and offered support.     Procedures:   Consultations:   The results of significant diagnostics from this hospitalization (including imaging, microbiology, ancillary and laboratory) are listed below for reference.   Significant Diagnostic Studies: DG Chest Port 1 View  Result Date: 12-Mar-2022 CLINICAL DATA:  dyspnea EXAM: PORTABLE CHEST 1 VIEW COMPARISON:  Chest x-ray 10/26/2021 FINDINGS: Left chest wall dual lead cardiac device in stable position. Enlarged cardiac silhouette. The heart and mediastinal contours are unchanged. Question retrocardiac airspace opacity. No pulmonary edema. No pleural effusion. No pneumothorax. No acute osseous abnormality. IMPRESSION: 1. Question retrocardiac airspace opacity recommend repeat PA and lateral view of the chest with improved inspiratory effort. 2. Stable cardiomegaly. Electronically Signed   By: Iven Finn M.D.   On: Mar 12, 2022 21:01   CUP PACEART REMOTE DEVICE CHECK  Result Date: 02/10/2022 Scheduled remote reviewed. Normal  device function.  Next remote 91 days. LA  DG Chest 2 View  Result Date: 01/26/2022 CLINICAL DATA:  Persistent cough and shortness of breath EXAM: CHEST - 2 VIEW COMPARISON:  Chest x-ray dated November 15, 2012 FINDINGS: Cardiac and mediastinal contours are unchanged. Left chest wall ICD  with unchanged lead position. New diffuse bilateral interstitial opacities and trace right-greater-than-left effusions. IMPRESSION: New pulmonary edema and trace bilateral pleural effusions. Electronically Signed   By: Yetta Glassman M.D.   On: 01/26/2022 11:17    Microbiology: Recent Results (from the past 240 hour(s))  SARS Coronavirus 2 by RT PCR (hospital order, performed in [facility] hospital lab) *cepheid single result test* Anterior Nasal Swab     Status: None   Collection Time: 02/24/2022  8:32 PM   Specimen: Anterior Nasal Swab  Result Value Ref Range Status   SARS Coronavirus 2 by RT PCR NEGATIVE NEGATIVE Final    Comment: (NOTE) SARS-CoV-2 target nucleic acids are NOT DETECTED.  The SARS-CoV-2 RNA is generally detectable in upper and lower respiratory specimens during the acute phase of infection. The lowest concentration of SARS-CoV-2 viral copies this assay can detect is 250 copies / mL. A negative result does not preclude SARS-CoV-2 infection and should not be used as the sole basis for treatment or other patient management decisions.  A negative result may occur with improper specimen collection / handling, submission of specimen other than nasopharyngeal swab, presence of viral mutation(s) within the areas targeted by this assay, and inadequate number of viral copies (<250 copies / mL). A negative result must be combined with clinical observations, patient history, and epidemiological information.  Fact Sheet for Patients:   https://www.patel.info/  Fact Sheet for Healthcare Providers: https://hall.com/  This test is not yet approved or  cleared by the [place] FDA and has been authorized for detection and/or diagnosis of SARS-CoV-2 by FDA under an Emergency Use Authorization (EUA).  This EUA will remain in effect (meaning this test can be used) for the duration of the COVID-19 declaration under Section 564(b)(1) of  the Act, 21 U.S.C. section 360bbb-3(b)(1), unless the authorization is terminated or revoked sooner.  Performed at [facility], [place] [address] [place], [place] [address]   Resp Panel by RT-PCR (Flu A&B, Covid) Anterior Nasal Swab     Status: None   Collection Time: 02/21/2022  8:32 PM   Specimen: Anterior Nasal Swab  Result Value Ref Range Status   SARS Coronavirus 2 by RT PCR NEGATIVE NEGATIVE Final    Comment: (NOTE) SARS-CoV-2 target nucleic acids are NOT DETECTED.  The SARS-CoV-2 RNA is generally detectable in upper respiratory specimens during the acute phase of infection. The lowest concentration of SARS-CoV-2 viral copies this assay can detect is 138 copies/mL. A negative result does not preclude SARS-Cov-2 infection and should not be used as the sole basis for treatment or other patient management decisions. A negative result may occur with  improper specimen collection/handling, submission of specimen other than nasopharyngeal swab, presence of viral mutation(s) within the areas targeted by this assay, and inadequate number of viral copies(<138 copies/mL). A negative result must be combined with clinical observations, patient history, and epidemiological information. The expected result is Negative.  Fact Sheet for Patients:  EntrepreneurPulse.com.au  Fact Sheet for Healthcare Providers:  IncredibleEmployment.be  This test is no t yet approved or cleared by the [place] FDA and  has been authorized for detection and/or diagnosis of SARS-CoV-2 by FDA under an Emergency Use  Authorization (EUA). This EUA will remain  in effect (meaning this test can be used) for the duration of the COVID-19 declaration under Section 564(b)(1) of the Act, 21 U.S.C.section 360bbb-3(b)(1), unless the authorization is terminated  or revoked sooner.       Influenza A by PCR NEGATIVE NEGATIVE Final   Influenza B by PCR NEGATIVE NEGATIVE Final     Comment: (NOTE) The Xpert Xpress SARS-CoV-2/FLU/RSV plus assay is intended as an aid in the diagnosis of influenza from Nasopharyngeal swab specimens and should not be used as a sole basis for treatment. Nasal washings and aspirates are unacceptable for Xpert Xpress SARS-CoV-2/FLU/RSV testing.  Fact Sheet for Patients: EntrepreneurPulse.com.au  Fact Sheet for Healthcare Providers: IncredibleEmployment.be  This test is not yet approved or cleared by the [place] FDA and has been authorized for detection and/or diagnosis of SARS-CoV-2 by FDA under an Emergency Use Authorization (EUA). This EUA will remain in effect (meaning this test can be used) for the duration of the COVID-19 declaration under Section 564(b)(1) of the Act, 21 U.S.C. section 360bbb-3(b)(1), unless the authorization is terminated or revoked.  Performed at [facility], [place] [address] [place], [place] [address]   Blood Culture (routine x 2)     Status: None (Preliminary result)   Collection Time: 02/22/2022  9:55 PM   Specimen: BLOOD LEFT FOREARM  Result Value Ref Range Status   Specimen Description BLOOD LEFT FOREARM  Final   Special Requests   Final    BOTTLES DRAWN AEROBIC AND ANAEROBIC Blood Culture results may not be optimal due to an inadequate volume of blood received in culture bottles   Culture   Final    NO GROWTH 2 DAYS Performed at [facility], [place] [address] [place], [place] [address]    Report Status PENDING  Incomplete    Time spent: 15 minutes  Signed: Kathie Dike, MD 02/15/22

## 2022-03-05 NOTE — ED Notes (Signed)
Niece at bedside until pt taken to morgue.

## 2022-03-05 NOTE — ED Notes (Signed)
[employer] staff at bedside to deactivate defibrillator.

## 2022-03-05 NOTE — Progress Notes (Signed)
Chaplain met with patient and her niece at bedside - RN called for emotional and spiritual support to family for EOL.  Met with patient nd anointed her with oil and prayed The Lords Prayer.  Niece spoke of her love for her 41 and shared many family stories and memories - blessed her 27 and then jointly prayed with Clinical biochemist. Patient passed peacefully.  Ended visit with a departing blessing.

## 2022-03-05 DEATH — deceased

## 2022-03-08 ENCOUNTER — Ambulatory Visit: Payer: Medicare HMO | Admitting: Internal Medicine

## 2022-03-11 ENCOUNTER — Ambulatory Visit: Payer: Medicare HMO | Admitting: Internal Medicine

## 2022-03-16 ENCOUNTER — Encounter: Payer: Medicare HMO | Admitting: Internal Medicine

## 2022-03-18 NOTE — Addendum Note (Signed)
Addended by: Douglass Rivers D on: 03/18/2022 09:13 AM   Modules accepted: Level of Service

## 2022-03-18 NOTE — Progress Notes (Signed)
Remote ICD transmission.
